# Patient Record
Sex: Female | Born: 1940 | Race: Black or African American | Hispanic: No | Marital: Single | Smoking: Former smoker
Health system: Southern US, Community
[De-identification: ages and names within clinical notes are randomized; demographics above are authoritative.]

## PROBLEM LIST (undated history)

## (undated) DIAGNOSIS — E11610 Type 2 diabetes mellitus with diabetic neuropathic arthropathy: Secondary | ICD-10-CM

## (undated) DIAGNOSIS — R198 Other specified symptoms and signs involving the digestive system and abdomen: Secondary | ICD-10-CM

## (undated) DIAGNOSIS — K219 Gastro-esophageal reflux disease without esophagitis: Secondary | ICD-10-CM

## (undated) DIAGNOSIS — J189 Pneumonia, unspecified organism: Secondary | ICD-10-CM

## (undated) DIAGNOSIS — M1990 Unspecified osteoarthritis, unspecified site: Secondary | ICD-10-CM

## (undated) DIAGNOSIS — E785 Hyperlipidemia, unspecified: Secondary | ICD-10-CM

## (undated) DIAGNOSIS — Z8719 Personal history of other diseases of the digestive system: Secondary | ICD-10-CM

## (undated) DIAGNOSIS — K649 Unspecified hemorrhoids: Secondary | ICD-10-CM

## (undated) DIAGNOSIS — S82891A Other fracture of right lower leg, initial encounter for closed fracture: Secondary | ICD-10-CM

## (undated) DIAGNOSIS — Z794 Long term (current) use of insulin: Secondary | ICD-10-CM

## (undated) DIAGNOSIS — K529 Noninfective gastroenteritis and colitis, unspecified: Secondary | ICD-10-CM

## (undated) DIAGNOSIS — E669 Obesity, unspecified: Secondary | ICD-10-CM

## (undated) DIAGNOSIS — S72401A Unspecified fracture of lower end of right femur, initial encounter for closed fracture: Secondary | ICD-10-CM

## (undated) DIAGNOSIS — I10 Essential (primary) hypertension: Secondary | ICD-10-CM

## (undated) DIAGNOSIS — Z8711 Personal history of peptic ulcer disease: Secondary | ICD-10-CM

## (undated) DIAGNOSIS — E7800 Pure hypercholesterolemia, unspecified: Secondary | ICD-10-CM

## (undated) DIAGNOSIS — E119 Type 2 diabetes mellitus without complications: Secondary | ICD-10-CM

## (undated) DIAGNOSIS — J45909 Unspecified asthma, uncomplicated: Secondary | ICD-10-CM

## (undated) DIAGNOSIS — R51 Headache: Secondary | ICD-10-CM

## (undated) DIAGNOSIS — F419 Anxiety disorder, unspecified: Secondary | ICD-10-CM

## (undated) HISTORY — DX: Other specified symptoms and signs involving the digestive system and abdomen: R19.8

## (undated) HISTORY — PX: CATARACT EXTRACTION W/ INTRAOCULAR LENS  IMPLANT, BILATERAL: SHX1307

## (undated) HISTORY — DX: Noninfective gastroenteritis and colitis, unspecified: K52.9

## (undated) HISTORY — PX: ABDOMINAL HYSTERECTOMY: SHX81

## (undated) HISTORY — PX: BREAST LUMPECTOMY: SHX2

---

## 1997-11-26 ENCOUNTER — Ambulatory Visit (HOSPITAL_COMMUNITY): Admission: RE | Admit: 1997-11-26 | Discharge: 1997-11-26 | Payer: Self-pay | Admitting: *Deleted

## 1997-12-06 ENCOUNTER — Ambulatory Visit (HOSPITAL_COMMUNITY): Admission: RE | Admit: 1997-12-06 | Discharge: 1997-12-06 | Payer: Self-pay | Admitting: *Deleted

## 1997-12-06 ENCOUNTER — Encounter: Payer: Self-pay | Admitting: *Deleted

## 1999-03-14 ENCOUNTER — Encounter: Payer: Self-pay | Admitting: Family Medicine

## 1999-03-14 ENCOUNTER — Ambulatory Visit (HOSPITAL_COMMUNITY): Admission: RE | Admit: 1999-03-14 | Discharge: 1999-03-14 | Payer: Self-pay | Admitting: Unknown Physician Specialty

## 2000-12-24 ENCOUNTER — Encounter: Payer: Self-pay | Admitting: Family Medicine

## 2000-12-24 ENCOUNTER — Ambulatory Visit (HOSPITAL_COMMUNITY): Admission: RE | Admit: 2000-12-24 | Discharge: 2000-12-24 | Payer: Self-pay | Admitting: Family Medicine

## 2002-05-24 ENCOUNTER — Emergency Department (HOSPITAL_COMMUNITY): Admission: EM | Admit: 2002-05-24 | Discharge: 2002-05-24 | Payer: Self-pay | Admitting: Emergency Medicine

## 2002-07-12 ENCOUNTER — Encounter: Payer: Self-pay | Admitting: Family Medicine

## 2002-07-12 ENCOUNTER — Encounter (HOSPITAL_BASED_OUTPATIENT_CLINIC_OR_DEPARTMENT_OTHER): Admission: RE | Admit: 2002-07-12 | Discharge: 2002-10-10 | Payer: Self-pay | Admitting: Internal Medicine

## 2002-07-12 ENCOUNTER — Ambulatory Visit (HOSPITAL_COMMUNITY): Admission: RE | Admit: 2002-07-12 | Discharge: 2002-07-12 | Payer: Self-pay | Admitting: Family Medicine

## 2002-08-27 ENCOUNTER — Emergency Department (HOSPITAL_COMMUNITY): Admission: EM | Admit: 2002-08-27 | Discharge: 2002-08-27 | Payer: Self-pay | Admitting: Emergency Medicine

## 2002-09-03 ENCOUNTER — Inpatient Hospital Stay (HOSPITAL_COMMUNITY): Admission: EM | Admit: 2002-09-03 | Discharge: 2002-09-09 | Payer: Self-pay | Admitting: Emergency Medicine

## 2002-09-03 ENCOUNTER — Emergency Department (HOSPITAL_COMMUNITY): Admission: EM | Admit: 2002-09-03 | Discharge: 2002-09-03 | Payer: Self-pay | Admitting: Emergency Medicine

## 2002-09-04 ENCOUNTER — Encounter: Payer: Self-pay | Admitting: Internal Medicine

## 2002-09-04 ENCOUNTER — Encounter: Payer: Self-pay | Admitting: Family Medicine

## 2002-09-04 DIAGNOSIS — K529 Noninfective gastroenteritis and colitis, unspecified: Secondary | ICD-10-CM

## 2002-09-04 HISTORY — DX: Noninfective gastroenteritis and colitis, unspecified: K52.9

## 2002-09-06 ENCOUNTER — Encounter (INDEPENDENT_AMBULATORY_CARE_PROVIDER_SITE_OTHER): Payer: Self-pay | Admitting: Specialist

## 2002-09-06 DIAGNOSIS — K529 Noninfective gastroenteritis and colitis, unspecified: Secondary | ICD-10-CM

## 2002-09-06 HISTORY — DX: Noninfective gastroenteritis and colitis, unspecified: K52.9

## 2002-09-07 ENCOUNTER — Encounter: Payer: Self-pay | Admitting: Internal Medicine

## 2002-10-19 ENCOUNTER — Encounter (HOSPITAL_BASED_OUTPATIENT_CLINIC_OR_DEPARTMENT_OTHER): Admission: RE | Admit: 2002-10-19 | Discharge: 2003-01-17 | Payer: Self-pay | Admitting: Internal Medicine

## 2002-11-03 ENCOUNTER — Ambulatory Visit (HOSPITAL_COMMUNITY): Admission: RE | Admit: 2002-11-03 | Discharge: 2002-11-03 | Payer: Self-pay | Admitting: Gastroenterology

## 2003-01-25 ENCOUNTER — Encounter (HOSPITAL_BASED_OUTPATIENT_CLINIC_OR_DEPARTMENT_OTHER): Admission: RE | Admit: 2003-01-25 | Discharge: 2003-04-02 | Payer: Self-pay | Admitting: Internal Medicine

## 2003-05-23 ENCOUNTER — Encounter (HOSPITAL_BASED_OUTPATIENT_CLINIC_OR_DEPARTMENT_OTHER): Admission: RE | Admit: 2003-05-23 | Discharge: 2003-08-21 | Payer: Self-pay | Admitting: Internal Medicine

## 2003-08-22 ENCOUNTER — Encounter (HOSPITAL_BASED_OUTPATIENT_CLINIC_OR_DEPARTMENT_OTHER): Admission: RE | Admit: 2003-08-22 | Discharge: 2003-08-31 | Payer: Self-pay | Admitting: Internal Medicine

## 2003-11-28 ENCOUNTER — Encounter (HOSPITAL_BASED_OUTPATIENT_CLINIC_OR_DEPARTMENT_OTHER): Admission: RE | Admit: 2003-11-28 | Discharge: 2004-02-26 | Payer: Self-pay | Admitting: Internal Medicine

## 2004-02-25 ENCOUNTER — Encounter: Admission: RE | Admit: 2004-02-25 | Discharge: 2004-02-25 | Payer: Self-pay | Admitting: Internal Medicine

## 2004-03-05 ENCOUNTER — Encounter (HOSPITAL_BASED_OUTPATIENT_CLINIC_OR_DEPARTMENT_OTHER): Admission: RE | Admit: 2004-03-05 | Discharge: 2004-04-25 | Payer: Self-pay | Admitting: Internal Medicine

## 2004-05-01 ENCOUNTER — Encounter (HOSPITAL_BASED_OUTPATIENT_CLINIC_OR_DEPARTMENT_OTHER): Admission: RE | Admit: 2004-05-01 | Discharge: 2004-07-30 | Payer: Self-pay | Admitting: Surgery

## 2004-08-01 ENCOUNTER — Encounter (HOSPITAL_BASED_OUTPATIENT_CLINIC_OR_DEPARTMENT_OTHER): Admission: RE | Admit: 2004-08-01 | Discharge: 2004-10-30 | Payer: Self-pay | Admitting: Surgery

## 2004-10-30 ENCOUNTER — Encounter (HOSPITAL_BASED_OUTPATIENT_CLINIC_OR_DEPARTMENT_OTHER): Admission: RE | Admit: 2004-10-30 | Discharge: 2005-01-28 | Payer: Self-pay | Admitting: Surgery

## 2004-11-30 ENCOUNTER — Ambulatory Visit (HOSPITAL_COMMUNITY): Admission: RE | Admit: 2004-11-30 | Discharge: 2004-11-30 | Payer: Self-pay | Admitting: Surgery

## 2005-06-29 ENCOUNTER — Encounter: Admission: RE | Admit: 2005-06-29 | Discharge: 2005-06-29 | Payer: Self-pay | Admitting: Internal Medicine

## 2005-09-23 ENCOUNTER — Encounter (HOSPITAL_BASED_OUTPATIENT_CLINIC_OR_DEPARTMENT_OTHER): Admission: RE | Admit: 2005-09-23 | Discharge: 2005-10-22 | Payer: Self-pay | Admitting: Surgery

## 2005-11-11 ENCOUNTER — Encounter (HOSPITAL_BASED_OUTPATIENT_CLINIC_OR_DEPARTMENT_OTHER): Admission: RE | Admit: 2005-11-11 | Discharge: 2006-01-14 | Payer: Self-pay | Admitting: Internal Medicine

## 2006-01-01 ENCOUNTER — Ambulatory Visit (HOSPITAL_COMMUNITY): Admission: RE | Admit: 2006-01-01 | Discharge: 2006-01-01 | Payer: Self-pay | Admitting: Orthopedic Surgery

## 2006-01-01 ENCOUNTER — Encounter: Payer: Self-pay | Admitting: Vascular Surgery

## 2006-01-13 ENCOUNTER — Encounter: Payer: Self-pay | Admitting: Vascular Surgery

## 2006-01-13 ENCOUNTER — Ambulatory Visit (HOSPITAL_COMMUNITY): Admission: RE | Admit: 2006-01-13 | Discharge: 2006-01-13 | Payer: Self-pay | Admitting: Orthopedic Surgery

## 2006-02-18 ENCOUNTER — Ambulatory Visit (HOSPITAL_COMMUNITY): Admission: RE | Admit: 2006-02-18 | Discharge: 2006-02-18 | Payer: Self-pay | Admitting: Orthopedic Surgery

## 2006-07-13 ENCOUNTER — Encounter: Admission: RE | Admit: 2006-07-13 | Discharge: 2006-07-13 | Payer: Self-pay | Admitting: Internal Medicine

## 2007-09-08 ENCOUNTER — Emergency Department (HOSPITAL_COMMUNITY): Admission: EM | Admit: 2007-09-08 | Discharge: 2007-09-08 | Payer: Self-pay | Admitting: Emergency Medicine

## 2008-02-27 HISTORY — PX: BELOW KNEE LEG AMPUTATION: SUR23

## 2008-03-13 ENCOUNTER — Encounter (INDEPENDENT_AMBULATORY_CARE_PROVIDER_SITE_OTHER): Payer: Self-pay | Admitting: Orthopaedic Surgery

## 2008-03-13 ENCOUNTER — Inpatient Hospital Stay (HOSPITAL_COMMUNITY): Admission: RE | Admit: 2008-03-13 | Discharge: 2008-03-19 | Payer: Self-pay | Admitting: Orthopaedic Surgery

## 2008-03-16 ENCOUNTER — Ambulatory Visit: Payer: Self-pay | Admitting: Physical Medicine & Rehabilitation

## 2008-03-19 ENCOUNTER — Inpatient Hospital Stay (HOSPITAL_COMMUNITY)
Admission: RE | Admit: 2008-03-19 | Discharge: 2008-03-30 | Payer: Self-pay | Admitting: Physical Medicine & Rehabilitation

## 2008-05-04 ENCOUNTER — Encounter
Admission: RE | Admit: 2008-05-04 | Discharge: 2008-05-04 | Payer: Self-pay | Admitting: Physical Medicine & Rehabilitation

## 2008-08-30 ENCOUNTER — Encounter: Admission: RE | Admit: 2008-08-30 | Discharge: 2008-08-30 | Payer: Self-pay | Admitting: Internal Medicine

## 2008-10-17 ENCOUNTER — Encounter: Admission: RE | Admit: 2008-10-17 | Discharge: 2009-01-16 | Payer: Self-pay | Admitting: Orthopaedic Surgery

## 2009-01-29 ENCOUNTER — Encounter: Admission: RE | Admit: 2009-01-29 | Discharge: 2009-04-29 | Payer: Self-pay | Admitting: Orthopaedic Surgery

## 2009-05-07 ENCOUNTER — Encounter: Admission: RE | Admit: 2009-05-07 | Discharge: 2009-05-16 | Payer: Self-pay | Admitting: Orthopaedic Surgery

## 2009-10-18 ENCOUNTER — Encounter: Admission: RE | Admit: 2009-10-18 | Discharge: 2009-10-18 | Payer: Self-pay | Admitting: Internal Medicine

## 2010-02-16 ENCOUNTER — Encounter: Payer: Self-pay | Admitting: Internal Medicine

## 2010-02-17 ENCOUNTER — Encounter: Payer: Self-pay | Admitting: Internal Medicine

## 2010-05-08 LAB — GLUCOSE, CAPILLARY
Glucose-Capillary: 103 mg/dL — ABNORMAL HIGH (ref 70–99)
Glucose-Capillary: 110 mg/dL — ABNORMAL HIGH (ref 70–99)
Glucose-Capillary: 139 mg/dL — ABNORMAL HIGH (ref 70–99)
Glucose-Capillary: 148 mg/dL — ABNORMAL HIGH (ref 70–99)
Glucose-Capillary: 166 mg/dL — ABNORMAL HIGH (ref 70–99)
Glucose-Capillary: 173 mg/dL — ABNORMAL HIGH (ref 70–99)
Glucose-Capillary: 187 mg/dL — ABNORMAL HIGH (ref 70–99)
Glucose-Capillary: 190 mg/dL — ABNORMAL HIGH (ref 70–99)

## 2010-05-08 LAB — BASIC METABOLIC PANEL
BUN: 2 mg/dL — ABNORMAL LOW (ref 6–23)
BUN: 3 mg/dL — ABNORMAL LOW (ref 6–23)
Chloride: 101 mEq/L (ref 96–112)
Chloride: 99 mEq/L (ref 96–112)
Creatinine, Ser: 0.64 mg/dL (ref 0.4–1.2)
GFR calc non Af Amer: >60 mL/min (ref >60)
Potassium: 3.6 mEq/L (ref 3.5–5.1)

## 2010-05-13 LAB — CROSSMATCH: ABO/RH(D): B NEG

## 2010-05-13 LAB — GLUCOSE, CAPILLARY
Comment 2: 120008390
Glucose-Capillary: 103 mg/dL — ABNORMAL HIGH (ref 70–99)
Glucose-Capillary: 109 mg/dL — ABNORMAL HIGH (ref 70–99)
Glucose-Capillary: 110 mg/dL — ABNORMAL HIGH (ref 70–99)
Glucose-Capillary: 117 mg/dL — ABNORMAL HIGH (ref 70–99)
Glucose-Capillary: 123 mg/dL — ABNORMAL HIGH (ref 70–99)
Glucose-Capillary: 145 mg/dL — ABNORMAL HIGH (ref 70–99)
Glucose-Capillary: 148 mg/dL — ABNORMAL HIGH (ref 70–99)
Glucose-Capillary: 156 mg/dL — ABNORMAL HIGH (ref 70–99)
Glucose-Capillary: 158 mg/dL — ABNORMAL HIGH (ref 70–99)
Glucose-Capillary: 159 mg/dL — ABNORMAL HIGH (ref 70–99)
Glucose-Capillary: 164 mg/dL — ABNORMAL HIGH (ref 70–99)
Glucose-Capillary: 166 mg/dL — ABNORMAL HIGH (ref 70–99)
Glucose-Capillary: 169 mg/dL — ABNORMAL HIGH (ref 70–99)
Glucose-Capillary: 170 mg/dL — ABNORMAL HIGH (ref 70–99)
Glucose-Capillary: 170 mg/dL — ABNORMAL HIGH (ref 70–99)
Glucose-Capillary: 172 mg/dL — ABNORMAL HIGH (ref 70–99)
Glucose-Capillary: 172 mg/dL — ABNORMAL HIGH (ref 70–99)
Glucose-Capillary: 177 mg/dL — ABNORMAL HIGH (ref 70–99)
Glucose-Capillary: 178 mg/dL — ABNORMAL HIGH (ref 70–99)
Glucose-Capillary: 179 mg/dL — ABNORMAL HIGH (ref 70–99)
Glucose-Capillary: 182 mg/dL — ABNORMAL HIGH (ref 70–99)
Glucose-Capillary: 183 mg/dL — ABNORMAL HIGH (ref 70–99)
Glucose-Capillary: 189 mg/dL — ABNORMAL HIGH (ref 70–99)
Glucose-Capillary: 193 mg/dL — ABNORMAL HIGH (ref 70–99)
Glucose-Capillary: 204 mg/dL — ABNORMAL HIGH (ref 70–99)
Glucose-Capillary: 206 mg/dL — ABNORMAL HIGH (ref 70–99)
Glucose-Capillary: 226 mg/dL — ABNORMAL HIGH (ref 70–99)
Glucose-Capillary: 23 mg/dL — CL (ref 70–99)
Glucose-Capillary: 249 mg/dL — ABNORMAL HIGH (ref 70–99)
Glucose-Capillary: 26 mg/dL — CL (ref 70–99)
Glucose-Capillary: 260 mg/dL — ABNORMAL HIGH (ref 70–99)
Glucose-Capillary: 38 mg/dL — CL (ref 70–99)
Glucose-Capillary: 49 mg/dL — ABNORMAL LOW (ref 70–99)
Glucose-Capillary: 70 mg/dL (ref 70–99)
Glucose-Capillary: 83 mg/dL (ref 70–99)
Glucose-Capillary: 88 mg/dL (ref 70–99)

## 2010-05-13 LAB — BASIC METABOLIC PANEL
BUN: 12 mg/dL (ref 6–23)
BUN: 6 mg/dL (ref 6–23)
CO2: 27 mEq/L (ref 19–32)
CO2: 27 mEq/L (ref 19–32)
Calcium: 8.1 mg/dL — ABNORMAL LOW (ref 8.4–10.5)
Calcium: 8.4 mg/dL (ref 8.4–10.5)
Chloride: 100 mEq/L (ref 96–112)
Chloride: 103 mEq/L (ref 96–112)
Creatinine, Ser: 0.72 mg/dL (ref 0.4–1.2)
Creatinine, Ser: 0.72 mg/dL (ref 0.4–1.2)
Creatinine, Ser: 0.99 mg/dL (ref 0.4–1.2)
GFR calc Af Amer: >60 mL/min (ref >60)
GFR calc Af Amer: >60 mL/min (ref >60)
GFR calc non Af Amer: 56 mL/min — ABNORMAL LOW (ref >60)
GFR calc non Af Amer: >60 mL/min (ref >60)
GFR calc non Af Amer: >60 mL/min (ref >60)
Glucose, Bld: 183 mg/dL — ABNORMAL HIGH (ref 70–99)
Glucose, Bld: 252 mg/dL — ABNORMAL HIGH (ref 70–99)
Glucose, Bld: 48 mg/dL — ABNORMAL LOW (ref 70–99)
Potassium: 3.9 mEq/L (ref 3.5–5.1)
Potassium: 4.1 mEq/L (ref 3.5–5.1)
Sodium: 136 mEq/L (ref 135–145)
Sodium: 137 mEq/L (ref 135–145)

## 2010-05-13 LAB — CBC
HCT: 26.1 % — ABNORMAL LOW (ref 36.0–46.0)
HCT: 27.5 % — ABNORMAL LOW (ref 36.0–46.0)
MCHC: 33.1 g/dL (ref 30.0–36.0)
MCHC: 33.7 g/dL (ref 30.0–36.0)
MCHC: 33.8 g/dL (ref 30.0–36.0)
MCHC: 34.3 g/dL (ref 30.0–36.0)
MCV: 74.6 fL — ABNORMAL LOW (ref 78.0–100.0)
MCV: 81.1 fL (ref 78.0–100.0)
MCV: 81.8 fL (ref 78.0–100.0)
Platelets: 278 10*3/uL (ref 150–400)
Platelets: 298 10*3/uL (ref 150–400)
Platelets: 313 10*3/uL (ref 150–400)
Platelets: 337 10*3/uL (ref 150–400)
Platelets: 372 10*3/uL (ref 150–400)
RBC: 3.36 MIL/uL — ABNORMAL LOW (ref 3.87–5.11)
RBC: 3.5 MIL/uL — ABNORMAL LOW (ref 3.87–5.11)
RDW: 19.7 % — ABNORMAL HIGH (ref 11.5–15.5)
RDW: 20 % — ABNORMAL HIGH (ref 11.5–15.5)
RDW: 20.2 % — ABNORMAL HIGH (ref 11.5–15.5)
RDW: 20.6 % — ABNORMAL HIGH (ref 11.5–15.5)
WBC: 11 10*3/uL — ABNORMAL HIGH (ref 4.0–10.5)
WBC: 8 10*3/uL (ref 4.0–10.5)

## 2010-05-13 LAB — URINE MICROSCOPIC-ADD ON

## 2010-05-13 LAB — MAGNESIUM: Magnesium: 1.4 mg/dL — ABNORMAL LOW (ref 1.5–2.5)

## 2010-05-13 LAB — DIFFERENTIAL
Basophils Relative: 1 % (ref 0–1)
Eosinophils Absolute: 0.2 10*3/uL (ref 0.0–0.7)
Monocytes Absolute: 0.4 10*3/uL (ref 0.1–1.0)
Monocytes Relative: 6 % (ref 3–12)

## 2010-05-13 LAB — URINALYSIS, ROUTINE W REFLEX MICROSCOPIC
Glucose, UA: NEGATIVE mg/dL
Nitrite: NEGATIVE
Specific Gravity, Urine: 1.005 (ref 1.005–1.030)
pH: 7 (ref 5.0–8.0)

## 2010-05-13 LAB — PREPARE RBC (CROSSMATCH)

## 2010-05-13 LAB — COMPREHENSIVE METABOLIC PANEL
ALT: <8 U/L (ref 0–35)
Albumin: 1.9 g/dL — ABNORMAL LOW (ref 3.5–5.2)
Alkaline Phosphatase: 54 U/L (ref 39–117)
GFR calc Af Amer: >60 mL/min (ref >60)
Potassium: 3.6 mEq/L (ref 3.5–5.1)
Sodium: 136 mEq/L (ref 135–145)
Total Protein: 5.7 g/dL — ABNORMAL LOW (ref 6.0–8.3)

## 2010-05-13 LAB — URINE CULTURE

## 2010-05-13 LAB — HEMOGLOBIN A1C
Hgb A1c MFr Bld: 6.5 % — ABNORMAL HIGH (ref 4.6–6.1)
Mean Plasma Glucose: 140 mg/dL
Mean Plasma Glucose: 154 mg/dL

## 2010-06-10 NOTE — Consult Note (Signed)
[name], [name]                  ACCOUNT NO.:  [id number]   MEDICAL RECORD NO.:  [id number]          PATIENT TYPE:  INP   LOCATION:  5039                         FACILITY:  [facility]   PHYSICIAN:  Lonia Blood, M.D.DATE OF BIRTH:  [birth year]   DATE OF CONSULTATION:  03/14/2008  DATE OF DISCHARGE:                                 CONSULTATION   REQUESTING PHYSICIAN:  Dr. Jerl Santos.   PRIMARY CARE PHYSICIAN:  Dr. Fleet Contras, M.D.   REASON FOR CONSULTATION:  Uncontrolled diabetes mellitus.   HISTORY OF PRESENT ILLNESS:  Ms. Judith [name] is a very pleasant 70-year-  old female with a longstanding history of diabetes and Charcot joints.  She suffered a right ankle fracture many months ago.  Unfortunately, she  therefore developed nonhealing ulcers of the same extremity.  She was  admitted to the hospital on March 13, 2008, by orthopedics to undergo  an elective right below-knee-amputation for the severe nonhealing wounds  and an essentially necrotic foot.  In the postoperative period, the  patient has had difficulty with severe hyperglycemia.  We have been  consulted to evaluate this issue and help to attend to the patient's  diabetes control during her hospital stay.   The patient is seen in her bed on the 5000 unit at [facility].  She presently has no complaints whatsoever.  She states that her pain is  well controlled.  She specifically denies chest pain, fevers, chills,  nausea, vomiting, abdominal pain and shortness of breath.   REVIEW OF SYSTEMS:  A comprehensive review of systems is entirely  unremarkable.   PAST MEDICAL HISTORY:  1. Uncontrolled diabetes mellitus.  2. Charcot joints bilateral ankles secondary to above.  3. Hypercholesterolemia.  4. Hypertension.  5. Status post right below-knee-amputation secondary to nonhealing      right lower extremity ulcers on March 13, 2008.   OUTPATIENT MEDICATIONS:  1. Actoplus Met 15/500 two tablets in the  a.m. and one tablet in the      p.m.  2. Omeprazole 40 mg daily.  3. Simvastatin 40 mg daily.  4. Diovan 160 daily.  5. Glipizide 10 mg q.a.m.  6. Aspirin 81 mg daily.  7. Calcium daily.  8. Levemir 35 units q.p.m.   ALLERGIES:  NO KNOWN DRUG ALLERGIES.   FAMILY HISTORY:  Noncontributory to this consultation.   SOCIAL HISTORY:  The patient lives in [place] with her son.  She does  not smoke.  She does not drink, nor has she ever.  She is retired.   LABORATORY DATA:  Sodium, potassium, chloride, bicarb, BUN and  creatinine are all normal.  Serum glucose is elevated at 252.  Calcium  is normal.  Hemoglobin was 8.6 at presentation, then dropped to 5.6  immediately postoperatively and with transfusion of 1 unit increased to  6.3.  White count is 11.9 with a platelet count of 278.  Chest x-ray  reveals no acute disease.  A 12-lead EKG is reviewed and reveals normal  sinus rhythm at 94 beats per minute with no acute ST or T-wave changes.  PHYSICAL EXAMINATION:  VITAL SIGNS:  Temperature 98.5, blood pressure  111/68, heart rate 90, respiratory rate 24.  O2 sats 100% on 2 liters  per minute nasal cannula.  CBG has ranged from 249-260 over the last 24  hours.  HEENT:  Normocephalic, atraumatic.  Pupils equal, round and reactive to  light and accommodation.  Extraocular muscles intact bilaterally.  OC/OP  clear.  NECK:  No JVD.  LUNGS:  Clear to auscultation without wheezes or rhonchi.  CARDIOVASCULAR:  Regular rate and rhythm without murmur, gallop or rub.  Normal S1-S2.  ABDOMEN:  Nontender, nondistended.  Soft.  Bowel sounds present.  No  organomegaly, no rebound, no ascites.  EXTREMITIES:  No significant clubbing, cyanosis or edema of the left  lower extremity.  NEUROLOGIC:  Nonfocal neurologic exam.   RECOMMENDATIONS:  1. Diabetes mellitus - the patient's diabetes is presently very poorly      controlled.  Strict control of diabetes is clearly indicated at all      times,  but even more so in the immediate perioperative period.  To      maximize wound healing, we must maintain consistent strict control      of the patient's blood sugars.  I will discontinue her oral      medications as they proved to be somewhat unpredictable in the      inpatient setting.  We will treat her strictly with sliding scale      insulin.  We will administered scheduled insulin with her meals, as      well as a dose of Lantus nightly.  We will follow her CBGs closely,      and it is anticipated that further adjustments will likely be      required.  Aspirin therapy has been resumed.  For now, we will hold      ACE inhibitor/ARB for blood pressures below 120 due to the concern      of aggravation of renal function in the setting of hypovolemia.  2. Acute blood loss anemia on a baseline of chronic anemia - it      appears on review of old records that the patient's baseline      hemoglobin is likely somewhere between 8-10.  She presented with a      hemoglobin of 8.6.  Postoperatively, she was found to have a      hemoglobin of 5.6.  This is likely due to a combination of blood      loss from the surgery, as well as volume expansion during surgery      leading to dilution.  I agree with transfusing as you are.  My goal      would be a minimal hemoglobin of 8 with ideal target of 10.  We      will follow her hemoglobin closely and continue to transfuse as      indicated.  3. Hypercholesterolemia - the patient is currently on treatment and      this will be continued.  4. Hypertension - at the present time the patient's blood pressure is      well controlled.  This is due to significant volume loss associated      with acute blood loss anemia.  We will hold her ACE inhibitor/ARB      until the patient's blood pressure is consistently greater than      120.   Thank you very much for your consultation on this pleasant lady.  We  will be happy to follow along with you during her  hospital stay.     Lonia Blood, M.D.  Electronically Signed    JTM/MEDQ  D:  03/14/2008  T:  03/14/2008  Job:  [id number]

## 2010-06-10 NOTE — Op Note (Signed)
[name], Judith [name]                  ACCOUNT NO.:  [id number]   MEDICAL RECORD NO.:  [id number]          PATIENT TYPE:  INP   LOCATION:  5039                         FACILITY:  [facility]   PHYSICIAN:  Lubertha Basque. Dalldorf, M.D.DATE OF BIRTH:  20-Jul-1940   DATE OF PROCEDURE:  03/13/2008  DATE OF DISCHARGE:                               OPERATIVE REPORT   PREOPERATIVE DIAGNOSIS:  Right foot necrosis.   POSTOPERATIVE DIAGNOSIS:  Right foot necrosis.   PROCEDURE:  Right below-knee amputation.   ANESTHESIA:  General.   ATTENDING SURGEON:  Lubertha Basque. Jerl Santos, MD   ASSISTANT:  Lindwood Qua, PA   INDICATION FOR PROCEDURE:  The patient is a 70 year old woman who  unfortunately suffered an ankle fracture several months back.  She is  beset with diabetes and Charcot feet.  She is for most part bed to chair  in terms of activity level.  Unfortunately, she has developed nonhealing  ulcers on her right foot and ankle.  She is offered below-knee  amputation at this point.  Informed operative consent was obtained from  Zilah and her family after discussion of possible complications including  reaction to anesthesia, poor healing, and need for further more proximal  amputation.   SUMMARY OF FINDINGS AND PROCEDURE:  Under general anesthesia, a right  below-knee amputation was performed with a posterior flap which was  brought forward under minimal tension.  Bryna Colander assisted  throughout and was invaluable to the completion of the case in that he  helped retract while I could perform the procedure.  He also closed  simultaneously to help minimize OR time.   DESCRIPTION OF PROCEDURE:  The patient was taken to the operating suite  where general anesthetic was applied without difficulty.  She was  positioned supine and prepped and draped in normal sterile fashion.  After administration of IV Kefzol, the right leg was elevated,  exsanguinated, and the tourniquet inflated about the thigh.  A  circumferential incision was made about 15 cm distal to the joint line  with a large posterior flap created which extended about 25 cm below the  joint line.  We dissected down through the soft tissues to expose bone.  Bone was resected at about 10 or 12 centimeters below the joint line  with oscillating saw.  We used a guillotine knife to remove the leg.  I  tied three vascular structures and ligated with suture.  We irrigated  thoroughly.  We then placed a Penrose drain and brought the posterior  flap forward sewing fascia to fascia with #1 Vicryl in interrupted  fashion.  Subcutaneous tissues were reapproximated with 0 and 2-0 undyed  Vicryl and skin was closed with staples.  Adaptic was applied followed  by dry gauze and a loose Ace wrap.  We did deflate the tourniquet during  closure to make sure skin edges all bled well to make sure that we had  good hemostasis.  This was done prior to any closure.  Estimated blood  loss was 200 mL and intraoperative fluids can be obtained from  anesthesia records.  DISPOSITION:  The patient was extubated in the operating room and taken  to recovery room in stable addition.  She was scheduled to be admitted  through Orthopedic Surgery Service with medical consultation.      Lubertha Basque Jerl Santos, M.D.  Electronically Signed     PGD/MEDQ  D:  03/13/2008  T:  03/14/2008  Job:  [id number]

## 2010-06-10 NOTE — Discharge Summary (Signed)
NAMENOELLY, LASSEIGNE                  ACCOUNT NO.:  [id number]   MEDICAL RECORD NO.:  [id number]          PATIENT TYPE:  IPS   LOCATION:  4025                         FACILITY:  [facility]   PHYSICIAN:  Ranelle Oyster, M.D.DATE OF BIRTH:  May 09, 1940   DATE OF ADMISSION:  03/19/2008  DATE OF DISCHARGE:  03/30/2008                               DISCHARGE SUMMARY   DISCHARGE DIAGNOSES:  1. Right below-knee amputation, March 13, 2008 secondary to      peripheral vascular disease.  2. Severe Charcot joints, pain controlled.  3. Diabetes mellitus.  4. Hypertension.  5. Hyperlipidemia.  6. Subcutaneous Lovenox for deep vein thrombosis prophylaxis.  7. Escherichia coli urinary tract infection.   HISTORY OF PRESENT ILLNESS:  This is a 70 year old female with history  of poorly controlled diabetes mellitus, severe Charcot joints, and right  ankle fracture many months ago.  The patient with nonhealing ulcer,  right lower extremity and necrotic changes.  Underwent right below-knee  amputation, March 13, 2008 per Dr. Jerl Santos.  Postoperative anemia  6.3 and transfused 2 units on March 14, 2008 with hemoglobin improved  to 9.3.  Pain control with PCA, discontinued March 17, 2008.  Subcutaneous Lovenox added for deep vein thrombosis prophylaxis.  Bouts  of hypoglycemia with Lantus insulin held.  Actos and Glucophage started  March 19, 2008 per Medicine Team.  The patient was admitted for  comprehensive rehab program.   PAST MEDICAL HISTORY:  See discharge diagnoses.  No alcohol or tobacco.   ALLERGIES:  None.   SOCIAL HISTORY:  Lives with son.  The patient attends Adult Day Care 4  hours a day and has a Home Health aide 3 hours a day.  Son can assist on  discharge.  One-level home, no steps to entry.   FUNCTIONAL HISTORY:  Prior to admission was wheelchair bound.  She had  not ambulated in 6 months.   FUNCTIONAL STATUS:  Upon admission to [facility], she was moderate  assist bed mobility plus total assist to transfer.  The patient fatigues  quickly with decreased endurance.  Gait was not tested.  She was max  assist for activities of daily living.   MEDICATIONS PRIOR TO ADMISSION:  1. Actos/Glucophage 15/500 mg 2 tablets in the a.m. and 1 tablet in      the p.m.  2. Prilosec 20 mg 2 tablets daily.  3. Zocor 40 mg daily.  4. Diovan 160 mg daily.  5. Glipizide 5 mg 2 tablets in the a.m.  6. Aspirin 81 mg daily.  7. Calcium daily.  8. Levemir 35 units in the p.m.   PHYSICAL EXAMINATION:  VITAL SIGNS:  Blood pressure 160/76, pulse 87,  temperature 98.2, and respirations 20.  GENERAL:  This was an alert female in no acute distress and oriented x3.  NEUROLOGIC:  Deep tendon reflexes were hypoactive.  Sensation decreased  to light touch distally.  EXTREMITIES:  Left calf was cool without any swelling, erythema, and  nontender.  Clips with Ace wrap to below-knee amputation site.  LUNGS:  Clear to auscultation.  CARDIAC:  Regular rate and rhythm.  ABDOMEN:  Soft and nontender.  Good bowel sounds.   REHABILITATION HOSPITAL COURSE:  The patient was admitted to Inpatient  Rehab Services with therapies initiated on a 3-hour daily basis  consisting of physical therapy, occupational therapy, and rehabilitation  nursing.  The following issues were addressed during the patient's  rehabilitation stay.  Pertaining to Mrs. Lavoy' right below-knee  amputation, March 13, 2008, surgical site is healing nicely.  Plan  would be to follow up with Dr. Jerl Santos.  Stump shrinker was applied per  [facility].  We will continue to follow her in our Outpatient  Amputee Clinic.  Pain management ongoing with the use of Percocet and  good results.  Her blood sugars were monitored daily with latest blood  sugars of 148, 187, and 87.  She remained on Glucophage of 500 mg twice  daily and Actos 30 mg daily.  She received full diabetic teaching.  She  remained on  subcutaneous Lovenox for deep vein thrombosis prophylaxis  throughout her rehab course.  Her blood pressures were well-controlled.  She remained on Benicar 40 mg daily with no orthostatic changes.  During  her rehabilitation course, noted E. coli urinary tract infection,  initially placed on amoxicillin until sensitivities returned and  switched to Cipro x7 days on March 28, 2008.  She did have some mild  hypokalemia during her rehab course with supplement added.  Latest  chemistries with potassium of 3.6.  This showed improvement from 3.1.  She would follow up with her primary MD.  She remained on Zocor for  history of hyperlipidemia.  Weekly collaborative interdisciplinary team  conferences were held to discuss the patient's estimated length of stay,  ongoing family teaching, and any barriers to discharge.  She was overall  moderate assist for bathing and bed as well as for lower body dressing.  Sliding board transfers with moderate assist, this was sometimes at max  assist and variable.  She exhibited no unsafe behavior.  Routine  toileting was provided.  She had occasional incontinence of bladder and  she was currently being treated for a urinary tract infection as well as  a trial of Ditropan was added.  She did have a small stage II area to  the right buttocks with protective barrier cream in place.  Home Health  therapies would be ongoing.   LATEST LABORATORY DATA:  Sodium 138, potassium 3.6, BUN 2, and  creatinine 0.6.  Hemoglobin 9.2, hematocrit 26.9, and platelet 372,000.   DISCHARGE MEDICATIONS AT TIME OF DICTATION:  1. Cipro 250 mg twice daily x5 more days.  2. Actos 30 mg daily.  3. Glucophage 500 mg twice daily.  4. Zocor 40 mg daily.  5. Protonix 80 mg daily.  6. Aspirin 81 mg daily.  7. Calcium 500 mg daily.  8. Benicar 40 mg daily.  9. Ditropan 5 mg twice daily.  10.Percocet 5/325 mg 1-2 tablets every 4 hours as needed for pain,      dispense of 90 tablets.    DIET:  Diabetic diet.   SPECIAL INSTRUCTIONS:  Stump shrinker per [facility].  Follow  up with Dr. Jerl Santos outpatient for ongoing care after below-knee  amputation.  Arrangements made to follow up in the Outpatient Amputee  Clinic with Dr. Faith Rogue.  She should continue to see Dr. Arville Go for medical management.      Mariam Dollar, P.A.      Ranelle Oyster, M.D.  Electronically  Signed    DA/MEDQ  D:  03/29/2008  T:  03/29/2008  Job:  [id number]   cc:   Lubertha Basque. Jerl Santos, M.D.  Arville Go, M.D.

## 2010-06-10 NOTE — H&P (Signed)
NAMEVERNESSA, LIKES                  ACCOUNT NO.:  [id number]   MEDICAL RECORD NO.:  [id number]          PATIENT TYPE:  IPS   LOCATION:  4025                         FACILITY:  [facility]   PHYSICIAN:  Ranelle Oyster, M.D.DATE OF BIRTH:  [birth year]   DATE OF ADMISSION:  03/19/2008  DATE OF DISCHARGE:                              HISTORY & PHYSICAL   CHIEF COMPLAINT:  Right leg pain.   PRIMARY PHYSICIAN:  Dr. Sofie Rower   ORTHOPEDIC SURGEON:  Lubertha Basque. Dalldorf, MD   HISTORY OF PRESENT ILLNESS:  This is a 70 year old African American  female with diabetes and severe Charcot joint deformities, who suffered  a right ankle fracture many months ago.  She developed a nonhealing  ulcer, which did not progress and develop chronic changes.  Ultimately,  she underwent a right below-the-knee amputation on February 16, by Dr.  Jerl Santos.  Postoperatively, she developed anemia and was transfused at  hemoglobin 6.3.  Most recent hemoglobin is 9.3.  Pain control has been  fair to good.  She is been on PCA for 2 days.  Subcu Lovenox has been on  board for DVT prophylaxis.  She has had bouts of hypoglycemia and her  Lantus insulin was held.  She was started on Actos and metformin on  February 22, per her medical team.  The patient has failed to progress  to a point where she could return home and then after rehab evaluation,  it was determined that she could benefit from an inpatient rehab stay.   REVIEW OF SYSTEMS:  Notable for incontinence, weakness, numbness.  She  denies constipation, shortness breath, chest pain.  Pain under control,  as noted above.  She is sleeping well.  Full reviews in the written H&P.   PAST MEDICAL HISTORY:  Notable for diabetes, severe Charcot joint with  recent right ankle fracture.  She has had left foot surgery in the past  as well.  She has hyperlipidemia and hypertension.  She denies alcohol  or tobacco.   FAMILY HISTORY:  Positive for CAD and diabetes.   SOCIAL HISTORY:  The patient lives with her son and attends adult day  care 4 hours a day, has home health aide 3 hours a day.  Son can assist  at home.  She has a one-level house with no steps to enter.  She has not  walked for several months due to her wound problems.  She uses a  wheelchair primarily at home.   ALLERGIES:  None.   HOME MEDICATIONS:  Actos/metformin, omeprazole, simvastatin, Diovan,  glipizide, aspirin, calcium, and Levemir.   LABORATORY DATA:  Hemoglobin 9.3, white count 7000, platelets 298.  Sodium 137, potassium 4.1, BUN 6, creatinine 0.6.   PHYSICAL EXAMINATION:  VITAL SIGNS:  Blood pressure is 160/76, pulse is  87, respiratory rate 20, temperature 98.2.  GENERAL:  The patient is pleasant, alert and oriented x3.  Affect is  bright and appropriate.  NECK:  Supple without JVD or lymphadenopathy.  EARS, NOSE, AND THROAT:  Unremarkable with fair dentition.  Pink moist  mucosa.  CHEST:  Clear to auscultation bilaterally without wheezes, rales, or  rhonchi.  HEART:  Regular rate and rhythm without murmur, rubs, or gallops.  ABDOMEN:  Soft, nontender.  Bowel sounds are positive.  SKIN:  Right leg is notable for intact incision with mild  serosanguineous discharge.  There is a mild area of redness over the  anterior aspect which bears watching.  Area was appropriately tender.  The wound fluid healthy overall.  NEUROLOGICAL:  Cranial nerves II  through XII are intact.  Reflexes are 1+.  Sensation was decreased in  distal limbs.  Strength was 5/5 in the upper extremities.  Right lower  extremity is 1-2/5 proximal.  Left lower extremity is 2-3/5 proximal and  4/5 distally.  Judgment, orientation, memory, and mood are all within  age-related limits.   POST ADMISSION PHYSICIAN EVALUATION:  1. Functional deficits, secondary to right below-knee amputation,      postoperative day #6.  The patient with severe Charcot deformity in      the left foot as well.  2. The  patient is admitted to receive collaborative interdisciplinary      care between the physiatrist, rehab nursing staff, and therapy      team.  3. The patient level of medical complexity and substantial therapy      needs in context upon functional assessment at the time of      preadmission screening, the patient  was requiring total assist for      bed mobility, max assist for transfers, and ambulation had not been      tested.  As the most recent evaluation within the last 24 hours,      the patient is in mod assist for bed mobility, max for transfers,      and gait has not been tested yet.  She is in max assist with ADLs.      The patient was wheelchair level prior to arrival, plus or minus      supervision.  4. Based on the patient's diagnosis, physical exam, and functional      history, the patient has potential for functional progress which      will result in measurable gains while inpatient rehab.  These gains      will be of substantial practical use upon discharge to home in      facilitating mobility, self-care, etc.  Interim changes and medical      status since the initial rehab consult were detailed in the history      of present illness above.  5. Physiatrist will provide 24-hour management of medical needs as      well as oversight of therapy plans last treatment and provide      guidance as appropriate regarding interaction of the two.  The      medical problem list and plan are listed below.  6. 24-hour rehab nursing will assist in management of the patient's      bowel and bladder needs as well as integration of therapy concepts,      techniques, education, pain management, safety awareness, skin care      needs, etc.  7. PT will assess and treat for lower extremity strength, pre-gait      training including functional mobility in transfers.  Assess for      adaptive equipment as appropriate as well with goals at a      supervision level.  8. OT will assess and treat  for ADLs and safety awareness  as well as      adaptive equipment.  Family education and functional mobility with      goals at a min assist to supervision level.  9. Prosthetics will assess the patient for preprosthetic care      including shrinker fit and education.  10.Case management/social worker will assess and treat for      psychosocial issues and discharge planning.  11.A team conference will be held weekly to assess progress towards      goals and determine barriers of discharge.  12.The patient has demonstrated sufficient medical stability and      exercise capacity to tolerate these 3 hours of therapy per day at      least 5 days per week.  13.Estimated length of stay is 10-14 days.  Prognosis fair to good.   MEDICAL PROBLEM LIST AND PLAN:  1. Diabetes:  The patient had been on no medications previously due to      hypoglycemia.  Actos and metformin are started today and we will      observe her response with activity, diet, etc.  2. Blood pressure control.  Continue Benicar 20 mg daily with p.r.n.      hold for systolic blood pressure to less than 120.  Observe with      activity in therapies.  3. Wound care:  Continue ACE wrap and dry dressing daily.  Wound      overall looks healthy.  4. Deep vein thrombosis prophylaxis, subcu Lovenox 50 mg daily.  5. Anticoagulation with aspirin 81 mg p.o. daily.  6. Pain management with Percocet p.r.n..  Observe for signs and      symptoms of lethargy, confusion, etc., and watch for impact of pain      upon therapy tolerance.      Ranelle Oyster, M.D.  Electronically Signed     ZTS/MEDQ  D:  03/19/2008  T:  03/20/2008  Job:  [id number]   cc:   Fleet Contras, M.D.

## 2010-06-13 NOTE — Assessment & Plan Note (Signed)
[facility]   NAMEKARRY, BARRILLEAUX                  ACCOUNT NO.:  [id number]   MEDICAL RECORD NO.:  [id number]      DATE OF BIRTH:  1940-10-23   PHYSICIAN:  Maxwell Caul, M.D. VISIT DATE:  12/28/2005                                   OFFICE VISIT   PURPOSE OF TODAY'S VISIT:  Followup abnormal area on her left plantar  foot.   Ms. Glowacki is a 70 year old diabetic, who returns with an abnormal area  on the volar aspect of her left foot.  She has been treated, in the  past, with custom inserts in shoes.  When she returned for this go  around to this clinic, we paired off a large amount of callus from this  area.  Ultimately, the area was opened and dressed and the area is  healed, but now looks much the same as it did when I first saw this 2  months ago.  She does not have any sensation.  She has no pain.  She had  a culture of this area done on November 26, that was negative.  I do not  see anything to culture right now.   WOUND EXAM:  There is an unchanged area of fluctuation on the plantar  aspect of her foot.  This has no particular evidence of infection.  Palpating deep within this shows some bony enlargement.  I think it  would be reasonable to see an orthopedic surgeon to see if he feels  anything would be worth doing here to prevent this area from recurrently  ulcerating.   MANAGEMENT PLAN & GOAL:  I did not think that we needed to dress this at  all.  She is to use white stocking and her own shoes with off loading  custom inserts.  I have asked her to make an appointment with Dr.  Doristine Section to look at that area of her foot to see if anything should  be done with an osteoectomy to remove the area and hence the prominence  that might lead to underlying ulceration.  In the meantime, the area is  clean and dry and we will follow her up again in a month.           ______________________________  Maxwell Caul, M.D.     MGR/MEDQ  D:   12/28/2005  T:  12/29/2005  Job:  [id number]

## 2010-06-13 NOTE — Discharge Summary (Signed)
NAMEVICKII, Judith [name]                  ACCOUNT NO.:  [id number]   MEDICAL RECORD NO.:  [id number]          PATIENT TYPE:  INP   LOCATION:  5039                         FACILITY:  [facility]   PHYSICIAN:  Lubertha Basque. Dalldorf, M.D.DATE OF BIRTH:  1940/03/06   DATE OF ADMISSION:  03/13/2008  DATE OF DISCHARGE:  03/19/2008                               DISCHARGE SUMMARY   ADMITTING DIAGNOSES:  Right leg infection, diabetes mellitus,  hypertension, and hyperlipidemia.   DISCHARGE DIAGNOSES:  Right leg infection, diabetes mellitus,  hypertension, hyperlipidemia, blood loss anemia, and hypopotassemia.   BRIEF HISTORY:  [name] is a 70 year old female patient known to our  practice with a right necrotic foot.  We have followed this for some  time, is actually a patient of Dr. Doristine Section referred to [place] for  treatment and consultation.  After discussing options with Ms. Sluder and  family members, it was decided because of the extent of the infection  and necrosis to proceed with a BKA amputation.   PERTINENT LABORATORY AND X-RAY FINDINGS:  Blood was replaced as  necessary throughout her hospital stay; hemoglobin 9.3, WBC 7.0,  hematocrit 27.5, platelets 298.  Sodium 137, potassium 4.1, glucose 183,  BUN 6, creatinine 0.64.   COURSE IN THE HOSPITAL:  The patient was admitted postoperatively with  standard orders of IV fluids, IV Ancef 1 g q.8 h., pain medicines,  antiemetics, elevation, kept on her home medicines as well and then  glycemic diabetic order control sheet for diabetics, nonpregnant.  The  first day postop, her lungs were clear, abdomen was soft.  Dressing was  changed on March 15, 2008, no sign of infection, minimal drainage.  A  medical consult was also obtained for management of her diabetes and  other medical conditions while she was in the hospital.  Her wound was  benign.  Her drains were pulled, and there were no significant signs of  infection.  She did have hemoglobin that  dropped on one or two  occasions, and blood was replaced as necessary.  She progressed well and  was discharged.   CONDITION ON DISCHARGE:  Improved.   FOLLOWUP:  She will remain on a low-sodium heart-healthy diet.  She will  have her dressing changed on an every-other-day basis.  We had also  consulted [employer] per possibility of treatment from them for stump wrap  and pre-planning for prosthesis.  She could be bed to chair transfers.  Would be back to see [place] in the office at 2-4 weeks for stitch and staple  removal.  Any sign of infection to call [phone].  She will be kept on  her home medicines, which will be outlined in a moment.   MEDICINES:  1. ACTOplus Met 15/500 two tablets q.a.m. and 1 tablet q.p.m.  2. Prilosec 40 mg 1 a day.  3. Simvastatin 40 mg 1 a day.  4. Diovan 160 daily.  5. Glipizide 10 mg q.a.m.  6. Aspirin 81 mg daily.  7. Calcium daily.  8. Levemir 35 units q.p.m.   Blood sugars to continue to be  checked on a regular schedule.  Sliding  scale of insulin as needed for blood sugar control.  Follow up with the  regular physician but follow up with Dr. Jerl Santos in 14 days plus.      Lindwood Qua, P.A.      Lubertha Basque Jerl Santos, M.D.  Electronically Signed    MC/MEDQ  D:  04/10/2008  T:  04/11/2008  Job:  [id number]

## 2010-07-23 ENCOUNTER — Emergency Department (HOSPITAL_COMMUNITY)
Admission: EM | Admit: 2010-07-23 | Discharge: 2010-07-23 | Disposition: A | Discharge status: Home / Self Care | Payer: Self-pay | Attending: Emergency Medicine | Admitting: Emergency Medicine

## 2010-07-23 DIAGNOSIS — E871 Hypo-osmolality and hyponatremia: Secondary | ICD-10-CM | POA: Insufficient documentation

## 2010-07-23 DIAGNOSIS — I10 Essential (primary) hypertension: Secondary | ICD-10-CM | POA: Insufficient documentation

## 2010-07-23 DIAGNOSIS — E119 Type 2 diabetes mellitus without complications: Secondary | ICD-10-CM | POA: Insufficient documentation

## 2010-07-23 DIAGNOSIS — S88119A Complete traumatic amputation at level between knee and ankle, unspecified lower leg, initial encounter: Secondary | ICD-10-CM | POA: Insufficient documentation

## 2010-07-23 LAB — URINALYSIS, ROUTINE W REFLEX MICROSCOPIC
Glucose, UA: NEGATIVE mg/dL
Ketones, ur: NEGATIVE mg/dL
Leukocytes, UA: NEGATIVE
Nitrite: NEGATIVE
Protein, ur: NEGATIVE mg/dL
pH: 7.5 (ref 5.0–8.0)

## 2010-07-23 LAB — GLUCOSE, CAPILLARY: Glucose-Capillary: 106 mg/dL — ABNORMAL HIGH (ref 70–99)

## 2010-07-23 LAB — POCT I-STAT, CHEM 8
BUN: 5 mg/dL — ABNORMAL LOW (ref 6–23)
Calcium, Ion: 1.07 mmol/L — ABNORMAL LOW (ref 1.12–1.32)
Chloride: 92 mEq/L — ABNORMAL LOW (ref 96–112)
HCT: 36 % (ref 36.0–46.0)
Sodium: 126 mEq/L — ABNORMAL LOW (ref 135–145)
TCO2: 25 mmol/L (ref 0–100)

## 2010-07-23 LAB — URINE MICROSCOPIC-ADD ON

## 2010-11-27 ENCOUNTER — Encounter (HOSPITAL_COMMUNITY): Payer: Self-pay | Admitting: Radiology

## 2010-11-27 ENCOUNTER — Emergency Department (HOSPITAL_COMMUNITY)
Admission: EM | Admit: 2010-11-27 | Discharge: 2010-11-27 | Disposition: A | Discharge status: Home / Self Care | Payer: Medicare Other | Attending: Emergency Medicine | Admitting: Emergency Medicine

## 2010-11-27 ENCOUNTER — Emergency Department (HOSPITAL_COMMUNITY): Payer: Medicare Other

## 2010-11-27 DIAGNOSIS — S88119A Complete traumatic amputation at level between knee and ankle, unspecified lower leg, initial encounter: Secondary | ICD-10-CM | POA: Insufficient documentation

## 2010-11-27 DIAGNOSIS — E119 Type 2 diabetes mellitus without complications: Secondary | ICD-10-CM | POA: Insufficient documentation

## 2010-11-27 DIAGNOSIS — I10 Essential (primary) hypertension: Secondary | ICD-10-CM | POA: Insufficient documentation

## 2010-11-27 DIAGNOSIS — Z794 Long term (current) use of insulin: Secondary | ICD-10-CM | POA: Insufficient documentation

## 2010-11-27 DIAGNOSIS — R259 Unspecified abnormal involuntary movements: Secondary | ICD-10-CM | POA: Insufficient documentation

## 2010-11-27 HISTORY — DX: Essential (primary) hypertension: I10

## 2010-11-27 LAB — DIFFERENTIAL
Basophils Absolute: 0.1 10*3/uL (ref 0.0–0.1)
Eosinophils Absolute: 0 10*3/uL (ref 0.0–0.7)
Eosinophils Relative: 1 % (ref 0–5)
Lymphocytes Relative: 11 % — ABNORMAL LOW (ref 12–46)
Lymphs Abs: 0.8 10*3/uL (ref 0.7–4.0)
Monocytes Absolute: 0.4 10*3/uL (ref 0.1–1.0)

## 2010-11-27 LAB — CBC
HCT: 34.6 % — ABNORMAL LOW (ref 36.0–46.0)
MCHC: 31.5 g/dL (ref 30.0–36.0)
MCV: 83.4 fL (ref 78.0–100.0)
Platelets: 283 10*3/uL (ref 150–400)
RDW: 15.1 % (ref 11.5–15.5)
WBC: 7.3 10*3/uL (ref 4.0–10.5)

## 2010-11-27 LAB — COMPREHENSIVE METABOLIC PANEL
BUN: 15 mg/dL (ref 6–23)
CO2: 27 mEq/L (ref 19–32)
Calcium: 9.5 mg/dL (ref 8.4–10.5)
Chloride: 100 mEq/L (ref 96–112)
Creatinine, Ser: 0.78 mg/dL (ref 0.50–1.10)
GFR calc Af Amer: >90 mL/min (ref >90)
GFR calc non Af Amer: 82 mL/min — ABNORMAL LOW (ref >90)
Total Bilirubin: 0.3 mg/dL (ref 0.3–1.2)

## 2010-11-27 LAB — MAGNESIUM: Magnesium: 2.3 mg/dL (ref 1.5–2.5)

## 2010-11-27 LAB — CK TOTAL AND CKMB (NOT AT ARMC): Relative Index: INVALID (ref 0.0–2.5)

## 2010-11-27 LAB — PHOSPHORUS: Phosphorus: 2.4 mg/dL (ref 2.3–4.6)

## 2010-11-27 LAB — POCT I-STAT TROPONIN I: Troponin i, poc: 0.01 ng/mL (ref 0.00–0.08)

## 2012-02-24 ENCOUNTER — Emergency Department (HOSPITAL_COMMUNITY): Payer: Medicare Other

## 2012-02-24 ENCOUNTER — Emergency Department (HOSPITAL_COMMUNITY)
Admission: EM | Admit: 2012-02-24 | Discharge: 2012-02-24 | Disposition: A | Discharge status: Home / Self Care | Payer: Medicare Other | Attending: Emergency Medicine | Admitting: Emergency Medicine

## 2012-02-24 ENCOUNTER — Encounter (HOSPITAL_COMMUNITY): Payer: Self-pay | Admitting: Emergency Medicine

## 2012-02-24 DIAGNOSIS — Z79899 Other long term (current) drug therapy: Secondary | ICD-10-CM | POA: Insufficient documentation

## 2012-02-24 DIAGNOSIS — N390 Urinary tract infection, site not specified: Secondary | ICD-10-CM | POA: Insufficient documentation

## 2012-02-24 DIAGNOSIS — S88119A Complete traumatic amputation at level between knee and ankle, unspecified lower leg, initial encounter: Secondary | ICD-10-CM | POA: Insufficient documentation

## 2012-02-24 DIAGNOSIS — Y9389 Activity, other specified: Secondary | ICD-10-CM | POA: Insufficient documentation

## 2012-02-24 DIAGNOSIS — Z794 Long term (current) use of insulin: Secondary | ICD-10-CM | POA: Insufficient documentation

## 2012-02-24 DIAGNOSIS — S7290XA Unspecified fracture of unspecified femur, initial encounter for closed fracture: Secondary | ICD-10-CM

## 2012-02-24 DIAGNOSIS — E119 Type 2 diabetes mellitus without complications: Secondary | ICD-10-CM | POA: Insufficient documentation

## 2012-02-24 DIAGNOSIS — Y929 Unspecified place or not applicable: Secondary | ICD-10-CM | POA: Insufficient documentation

## 2012-02-24 DIAGNOSIS — B961 Klebsiella pneumoniae [K. pneumoniae] as the cause of diseases classified elsewhere: Secondary | ICD-10-CM | POA: Insufficient documentation

## 2012-02-24 DIAGNOSIS — D0000 Carcinoma in situ of oral cavity, unspecified site: Secondary | ICD-10-CM | POA: Insufficient documentation

## 2012-02-24 DIAGNOSIS — I10 Essential (primary) hypertension: Secondary | ICD-10-CM | POA: Insufficient documentation

## 2012-02-24 DIAGNOSIS — W010XXA Fall on same level from slipping, tripping and stumbling without subsequent striking against object, initial encounter: Secondary | ICD-10-CM | POA: Insufficient documentation

## 2012-02-24 DIAGNOSIS — S72409A Unspecified fracture of lower end of unspecified femur, initial encounter for closed fracture: Secondary | ICD-10-CM | POA: Insufficient documentation

## 2012-02-24 DIAGNOSIS — S82209A Unspecified fracture of shaft of unspecified tibia, initial encounter for closed fracture: Secondary | ICD-10-CM

## 2012-02-24 LAB — URINALYSIS, ROUTINE W REFLEX MICROSCOPIC
Glucose, UA: NEGATIVE mg/dL
Ketones, ur: NEGATIVE mg/dL
Protein, ur: NEGATIVE mg/dL
Urobilinogen, UA: 0.2 mg/dL (ref 0.0–1.0)

## 2012-02-24 LAB — BASIC METABOLIC PANEL
CO2: 29 mEq/L (ref 19–32)
Calcium: 9.8 mg/dL (ref 8.4–10.5)
Creatinine, Ser: 0.64 mg/dL (ref 0.50–1.10)
GFR calc non Af Amer: 87 mL/min — ABNORMAL LOW (ref >90)

## 2012-02-24 LAB — CBC WITH DIFFERENTIAL/PLATELET
Basophils Absolute: 0 10*3/uL (ref 0.0–0.1)
Basophils Relative: 0 % (ref 0–1)
Eosinophils Absolute: 0 10*3/uL (ref 0.0–0.7)
Eosinophils Relative: 0 % (ref 0–5)
HCT: 34.4 % — ABNORMAL LOW (ref 36.0–46.0)
Lymphocytes Relative: 8 % — ABNORMAL LOW (ref 12–46)
MCH: 26.6 pg (ref 26.0–34.0)
MCHC: 32.8 g/dL (ref 30.0–36.0)
MCV: 80.9 fL (ref 78.0–100.0)
Monocytes Absolute: 0.5 10*3/uL (ref 0.1–1.0)
Platelets: 280 10*3/uL (ref 150–400)
RDW: 15.1 % (ref 11.5–15.5)

## 2012-02-24 LAB — URINE MICROSCOPIC-ADD ON

## 2012-02-24 LAB — GLUCOSE, CAPILLARY: Glucose-Capillary: 199 mg/dL — ABNORMAL HIGH (ref 70–99)

## 2012-02-24 MED ORDER — SODIUM CHLORIDE 0.9 % IV SOLN
INTRAVENOUS | Status: DC
  Administered 2012-02-24: 17:00:00 via INTRAVENOUS

## 2012-02-24 MED ORDER — CEPHALEXIN 500 MG PO CAPS: 500.0000 mg | ORAL_CAPSULE | Freq: Four times a day (QID) | ORAL | Status: DC

## 2012-02-24 MED ORDER — FENTANYL CITRATE 0.05 MG/ML IJ SOLN
100.0000 ug | Freq: Once | INTRAMUSCULAR | Status: AC
  Administered 2012-02-24: 100 ug via INTRAVENOUS
  Filled 2012-02-24: qty 2

## 2012-02-24 MED ORDER — ONDANSETRON HCL 4 MG/2ML IJ SOLN
4.0000 mg | Freq: Once | INTRAMUSCULAR | Status: AC
  Administered 2012-02-24: 4 mg via INTRAVENOUS
  Filled 2012-02-24: qty 2

## 2012-02-24 MED ORDER — OXYCODONE-ACETAMINOPHEN 5-325 MG PO TABS: 1.0000 | ORAL_TABLET | ORAL | Status: DC | PRN

## 2012-02-24 MED ORDER — ONDANSETRON HCL 4 MG PO TABS: 4.0000 mg | ORAL_TABLET | Freq: Three times a day (TID) | ORAL | Status: DC | PRN

## 2012-02-24 NOTE — ED Notes (Signed)
Ortho tech at bedside 

## 2012-02-24 NOTE — ED Provider Notes (Signed)
History     CSN: [id number]  Arrival date & time 02/24/12  1344   First MD Initiated Contact with Patient 02/24/12 1349      Chief Complaint  Patient presents with  . Fall  . Leg Pain    (Consider location/radiation/quality/duration/timing/severity/associated sxs/prior treatment) HPI Comments: Judith [name] is a 72 y.o. Female who was sitting in a chair, stood up to walk, stumbled a few steps and fell onto her right leg. Her husband was with her, and caught her as she went to the floor. She has an isolated injury to the right knee. She denies head, neck, or back pain. She denies recent illnesses. No fever, chills, nausea, vomiting, cough, change in bowel or urinary habits. She has pain with movement of the right leg, and it improves with rest. There are no other modifying factors.  Patient is a 72 y.o. female presenting with fall and leg pain. The history is provided by the patient.  Fall  Leg Pain     Past Medical History  Diagnosis Date  . Hypertension   . Diabetes mellitus     Past Surgical History  Procedure Date  . Below knee leg amputation     Right    No family history on file.  History  Substance Use Topics  . Smoking status: Never Smoker   . Smokeless tobacco: Not on file  . Alcohol Use: No    OB History    Grav Para Term Preterm Abortions TAB SAB Ect Mult Living                  Review of Systems  All other systems reviewed and are negative.    Allergies  Review of patient's allergies indicates no known allergies.  Home Medications   Current Outpatient Rx  Name  Route  Sig  Dispense  Refill  . CHLORPHEN-PE-ACETAMINOPHEN 4-10-325 MG PO TABS   Oral   Take 1 tablet by mouth 2 (two) times daily as needed. For congestion         . CLONIDINE HCL 0.1 MG PO TABS   Oral   Take 0.1 mg by mouth 2 (two) times daily.         Marland Kitchen GLIPIZIDE ER 5 MG PO TB24   Oral   Take 10 mg by mouth daily.         . INSULIN DETEMIR 100 UNIT/ML [place] SOLN  Subcutaneous   Inject 30 Units into the skin at bedtime.         Marland Kitchen SIMVASTATIN 40 MG PO TABS   Oral   Take 40 mg by mouth every evening.           BP 161/65  Pulse 89  Temp 97.9 F (36.6 C) (Oral)  Resp 16  SpO2 100%  Physical Exam  Nursing note and vitals reviewed. Constitutional: She is oriented to person, place, and time. She appears well-developed and well-nourished.  HENT:  Head: Normocephalic and atraumatic.  Eyes: Conjunctivae normal and EOM are normal. Pupils are equal, round, and reactive to light.  Neck: Normal range of motion and phonation normal. Neck supple.  Cardiovascular: Normal rate, regular rhythm and intact distal pulses.   Pulmonary/Chest: Effort normal and breath sounds normal. She exhibits no tenderness.  Abdominal: Soft. She exhibits no distension. There is no tenderness. There is no guarding.  Musculoskeletal: Normal range of motion.  Neurological: She is alert and oriented to person, place, and time. She has normal strength. She exhibits  normal muscle tone.  Skin: Skin is warm and dry.  Psychiatric: She has a normal mood and affect. Her behavior is normal. Judgment and thought content normal.    ED Course  Procedures (including critical care time)  Emergency department treatment: IV, fentanyl, and Zofran  Imaging right leg and knee     Date: 11/13/2011  Rate: 88  Rhythm: normal sinus rhythm  QRS Axis: normal  PR and QT Intervals: PR prolonged  ST/T Wave abnormalities: nonspecific T wave changes  PR and QRS Conduction Disutrbances:first-degree A-V block   Narrative Interpretation:   Old EKG Reviewed: changes noted- PR is longer today    Reevaluation: 16:10-she is comfortable. Now, when not moving leg. I discussed the case with orthopedics; Dr. Jerl Santos, reviewed her films, and would like to manage her, as an outpatient. The patient's son and the patient, agree with this treatment plan.  A long-leg splint is applied to the right leg;  after additional pain control   Screening evaluation for medical causes of fall was ordered;       Labs Reviewed  GLUCOSE, CAPILLARY - Abnormal; Notable for the following:    Glucose-Capillary 199 (*)     All other components within normal limits  CBC WITH DIFFERENTIAL - Abnormal; Notable for the following:    WBC 10.9 (*)     Hemoglobin 11.3 (*)     HCT 34.4 (*)     Neutrophils Relative 86 (*)     Neutro Abs 9.4 (*)     Lymphocytes Relative 8 (*)     All other components within normal limits  BASIC METABOLIC PANEL - Abnormal; Notable for the following:    Glucose, Bld 216 (*)     GFR calc non Af Amer 87 (*)     All other components within normal limits  URINALYSIS, ROUTINE W REFLEX MICROSCOPIC  URINE CULTURE   Dg Femur Right  02/24/2012  *RADIOLOGY REPORT*  Clinical Data: Fall.  Pain in the distal femur.  RIGHT FEMUR - 2 VIEW  Comparison: None available.  Findings: The proximal femur demonstrates mild osteopenia.  The hip is located.  No acute proximal abnormality is evident. Atherosclerotic calcifications are present in the femoral artery. The distal femur is imaged on the knee films of the same day.  IMPRESSION:  1.  No acute abnormality of the proximal femur. 2.  Mild osteopenia. 3.  Atherosclerosis.   Original Report Authenticated By: Marin Roberts, M.D.    Dg Knee 1-2 Views Right  02/24/2012  *RADIOLOGY REPORT*  Clinical Data: Fall.  Distal femur pain.  RIGHT KNEE - 1-2 VIEW  Comparison: Proximal femur films of the same day.  Findings: A comminuted distal femur fracture is present.  There is slight dorsal angulation.  The patient is status post below-the- knee amputation.  A lateral tibial plateau fracture is also present, slightly depressed.  IMPRESSION:  1.  Comminuted lateral distal femur fracture with slight posterior angulation. 2.  Suspects and lateral tibial plateau fracture is well. 3.  Moderate osteopenia. 4.  Status post below-the-knee amputation. 5.   Atherosclerosis.   Original Report Authenticated By: Marin Roberts, M.D.    Nursing notes, applicable records and vitals reviewed.  Radiologic Images/Reports reviewed.   1. Femur fracture   2. Tibia fracture       MDM  Fall with right leg fracture. Fracture is nonsurgical.       Plan: Home Medications- Percocet; Home Treatments- Non-weight bearing right leg; Recommended follow up- Ortho 3-5  days     Flint Melter, MD 02/25/12 [phone]

## 2012-02-24 NOTE — ED Notes (Signed)
Pt arrived by EMS from home. Pt has a right BKA and uses a prosthesis. While using walker and standing up to go use the bathroom pt "legs gave out" and husband assisted pt to the carpet floor. Pt c/o right leg pain with movement 10/10 and stated that it only hurts a little while resting it. Denies passing out, dizziness or hitting head. CBG: 156 BP-176/90 hr-100.

## 2012-02-24 NOTE — ED Notes (Signed)
Placed call to [facility] for transport back home 

## 2012-02-24 NOTE — ED Provider Notes (Signed)
Urinalysis positive. Patient given prescription for Keflex, urine culture sent. Discharge according to plan established by Dr. Effie Shy  BP 133/51  Pulse 91  Temp 97.9 F (36.6 C) (Oral)  Resp 20  SpO2 100%   Glynn Octave, MD 02/24/12 1935

## 2012-02-26 LAB — URINE CULTURE: Colony Count: >=100000

## 2012-02-27 NOTE — ED Notes (Signed)
+  Urine. Patient treated with Keflex. Sensitive to same. Per protocol MD. °

## 2012-02-29 ENCOUNTER — Inpatient Hospital Stay (HOSPITAL_COMMUNITY): Payer: Medicare Other

## 2012-02-29 ENCOUNTER — Inpatient Hospital Stay (HOSPITAL_COMMUNITY)
Admission: AD | Admit: 2012-02-29 | Discharge: 2012-03-04 | DRG: 481 | Disposition: A | Discharge status: Rehabilitation Facility | Payer: Medicare Other | Source: Ambulatory Visit | Attending: Orthopedic Surgery | Admitting: Orthopedic Surgery

## 2012-02-29 ENCOUNTER — Other Ambulatory Visit: Payer: Self-pay | Admitting: Physician Assistant

## 2012-02-29 ENCOUNTER — Encounter (HOSPITAL_COMMUNITY): Payer: Self-pay | Admitting: Orthopedic Surgery

## 2012-02-29 DIAGNOSIS — S72453A Displaced supracondylar fracture without intracondylar extension of lower end of unspecified femur, initial encounter for closed fracture: Principal | ICD-10-CM | POA: Diagnosis present

## 2012-02-29 DIAGNOSIS — E119 Type 2 diabetes mellitus without complications: Secondary | ICD-10-CM | POA: Diagnosis present

## 2012-02-29 DIAGNOSIS — E669 Obesity, unspecified: Secondary | ICD-10-CM | POA: Diagnosis present

## 2012-02-29 DIAGNOSIS — E785 Hyperlipidemia, unspecified: Secondary | ICD-10-CM | POA: Diagnosis present

## 2012-02-29 DIAGNOSIS — N390 Urinary tract infection, site not specified: Secondary | ICD-10-CM | POA: Diagnosis present

## 2012-02-29 DIAGNOSIS — Y92009 Unspecified place in unspecified non-institutional (private) residence as the place of occurrence of the external cause: Secondary | ICD-10-CM

## 2012-02-29 DIAGNOSIS — E639 Nutritional deficiency, unspecified: Secondary | ICD-10-CM | POA: Diagnosis present

## 2012-02-29 DIAGNOSIS — I10 Essential (primary) hypertension: Secondary | ICD-10-CM | POA: Diagnosis present

## 2012-02-29 DIAGNOSIS — IMO0001 Reserved for inherently not codable concepts without codable children: Secondary | ICD-10-CM

## 2012-02-29 DIAGNOSIS — S88119A Complete traumatic amputation at level between knee and ankle, unspecified lower leg, initial encounter: Secondary | ICD-10-CM

## 2012-02-29 DIAGNOSIS — S72401A Unspecified fracture of lower end of right femur, initial encounter for closed fracture: Secondary | ICD-10-CM | POA: Diagnosis present

## 2012-02-29 DIAGNOSIS — Z794 Long term (current) use of insulin: Secondary | ICD-10-CM

## 2012-02-29 DIAGNOSIS — W19XXXA Unspecified fall, initial encounter: Secondary | ICD-10-CM | POA: Diagnosis present

## 2012-02-29 DIAGNOSIS — D62 Acute posthemorrhagic anemia: Secondary | ICD-10-CM

## 2012-02-29 DIAGNOSIS — Z79899 Other long term (current) drug therapy: Secondary | ICD-10-CM

## 2012-02-29 DIAGNOSIS — Z89511 Acquired absence of right leg below knee: Secondary | ICD-10-CM

## 2012-02-29 HISTORY — DX: Type 2 diabetes mellitus with diabetic neuropathic arthropathy: E11.610

## 2012-02-29 HISTORY — DX: Long term (current) use of insulin: Z79.4

## 2012-02-29 HISTORY — DX: Type 2 diabetes mellitus without complications: E11.9

## 2012-02-29 HISTORY — DX: Hyperlipidemia, unspecified: E78.5

## 2012-02-29 HISTORY — DX: Obesity, unspecified: E66.9

## 2012-02-29 HISTORY — DX: Other fracture of right lower leg, initial encounter for closed fracture: S82.891A

## 2012-02-29 HISTORY — DX: Unspecified fracture of lower end of right femur, initial encounter for closed fracture: S72.401A

## 2012-02-29 LAB — GLUCOSE, CAPILLARY: Glucose-Capillary: 200 mg/dL — ABNORMAL HIGH (ref 70–99)

## 2012-02-29 LAB — CBC
MCH: 26.1 pg (ref 26.0–34.0)
MCHC: 32.3 g/dL (ref 30.0–36.0)
MCV: 80.8 fL (ref 78.0–100.0)
Platelets: 294 10*3/uL (ref 150–400)
RBC: 3.64 MIL/uL — ABNORMAL LOW (ref 3.87–5.11)
RDW: 15 % (ref 11.5–15.5)

## 2012-02-29 LAB — URINE MICROSCOPIC-ADD ON

## 2012-02-29 LAB — BASIC METABOLIC PANEL
CO2: 30 mEq/L (ref 19–32)
Calcium: 9.3 mg/dL (ref 8.4–10.5)
Creatinine, Ser: 0.71 mg/dL (ref 0.50–1.10)
GFR calc non Af Amer: 84 mL/min — ABNORMAL LOW (ref >90)
Glucose, Bld: 135 mg/dL — ABNORMAL HIGH (ref 70–99)
Sodium: 141 mEq/L (ref 135–145)

## 2012-02-29 LAB — URINALYSIS, ROUTINE W REFLEX MICROSCOPIC
Bilirubin Urine: NEGATIVE
Glucose, UA: NEGATIVE mg/dL
Ketones, ur: NEGATIVE mg/dL
Protein, ur: NEGATIVE mg/dL
Urobilinogen, UA: 0.2 mg/dL (ref 0.0–1.0)

## 2012-02-29 LAB — PROTIME-INR: Prothrombin Time: 13.8 seconds (ref 11.6–15.2)

## 2012-02-29 MED ORDER — LACTATED RINGERS IV SOLN
INTRAVENOUS | Status: DC
  Administered 2012-02-29: 50 mL/h via INTRAVENOUS

## 2012-02-29 MED ORDER — MORPHINE SULFATE 2 MG/ML IJ SOLN: 0.5000 mg | INTRAMUSCULAR | Status: DC | PRN

## 2012-02-29 MED ORDER — HYDROCODONE-ACETAMINOPHEN 5-325 MG PO TABS
1.0000 | ORAL_TABLET | Freq: Four times a day (QID) | ORAL | Status: DC | PRN
  Administered 2012-02-29: 2 via ORAL
  Filled 2012-02-29: qty 2

## 2012-02-29 MED ORDER — CEFAZOLIN SODIUM-DEXTROSE 2-3 GM-% IV SOLR
2.0000 g | Freq: Once | INTRAVENOUS | Status: AC
  Administered 2012-03-01: 2 g via INTRAVENOUS
  Filled 2012-02-29: qty 50

## 2012-02-29 MED ORDER — ADULT MULTIVITAMIN W/MINERALS CH
1.0000 | ORAL_TABLET | Freq: Every day | ORAL | Status: DC
  Administered 2012-02-29 – 2012-03-04 (×5): 1 via ORAL
  Filled 2012-02-29 (×5): qty 1

## 2012-02-29 NOTE — Progress Notes (Signed)
Orthopedic Tech Progress Note Patient Details:  Judith [name] 1940/05/16 [id number] Applied overhead frame to bed.     [name] 02/29/2012, 4:44 PM

## 2012-02-29 NOTE — Progress Notes (Signed)
INITIAL NUTRITION ASSESSMENT  DOCUMENTATION CODES Per approved criteria  -Obesity Unspecified   INTERVENTION: 1. MVI daily 2. Recommend Glucerna Shake po daily, each supplement provides 220 kcal and 10 grams of protein, if intake of meals declines. 3. RD to continue to follow nutrition care plan  NUTRITION DIAGNOSIS: Increased nutrient needs related to post-op healing as evidenced by estimated needs.   Goal: Pt to meet >/= 90% of their estimated nutrition needs.  Monitor:  weight trends, lab trends, I/O's, PO intake, supplement tolerance  Reason for Assessment: MD Consult for Nutrition Assessment  72 y.o. female  Admitting Dx: R leg pain  ASSESSMENT: S/p BKA in 2010. S/p fall approximately 1 week ago and sustained supracondylar/intracondylar femoral fracture. Admitted for surgery for her fracture.  Pt states that her appetite and weight were stable PTA. She is currently eating lunch and has consumed >75% of it so far. She states that she does not have dentures with her but she does not need a texture-modified diet at this time.  Discussed nutritional supplements for increased kcal/protein needs however pt declined, states that she feels as though her intake is adequate at this time.  Height: Ht Readings from Last 1 Encounters:  02/29/12 5\' 4"  (1.626 m)   Weight: Wt Readings from Last 1 Encounters:  02/29/12 195 lb (88.451 kg)   Ideal Body Weight: 113 lb (adjusted for BKA)  % Ideal Body Weight: 172%  Wt Readings from Last 10 Encounters:  02/29/12 195 lb (88.451 kg)    Usual Body Weight: 190 - 195 lb (per pt)  % Usual Body Weight: 100%  BMI:  Body mass index is 33.47 kg/(m^2). Obese Class I  Estimated Nutritional Needs: Kcal: 1600 - 1700 kcal Protein: 80 - 90 grams Fluid: 1.8 - 2 liters daily  Diet Order: Carb Control Medium  EDUCATION NEEDS: -No education needs identified at this time  No intake or output data in the 24 hours ending 02/29/12  1435  Last BM: PTA  Labs:   Lab 02/24/12 1617  NA 140  K 4.0  CL 98  CO2 29  BUN 14  CREATININE 0.64  CALCIUM 9.8  MG --  PHOS --  GLUCOSE 216*    CBG (last 3)   Basename 02/29/12 1338  GLUCAP 118*    Scheduled Meds:   Continuous Infusions:    . lactated ringers      Past Medical History  Diagnosis Date  . Hypertension   . Diabetes mellitus     Past Surgical History  Procedure Date  . Below knee leg amputation     Right    Jarold Motto MS, RD, LDN Pager: [phone] After-hours pager: [phone]

## 2012-02-29 NOTE — H&P (Signed)
Judith [name] is an 72 y.o. female.   Chief Complaint: right leg pain. HPI: Judith [name] is a 73 year old female who has gone through AP BKA in 2010.she did very well and I back to walking.  Unfortunately about a week ago she fell at home.  She denies any chest pain or dizziness prior to falling.  No loss of consciousness.  She was taken to the emergency room and was noted to have a mildly displaced fracture of the distal femur.  She was put into a splint.  She has maintained in nonweightbearing status and is in a wheelchair.  She presented to our office today for further evaluation.  X-rays were taken which did show a displaced distal femoral fracture which is intracondylar.  We had discussed treatment options with her that being surgical as our best option.  We have contacted Dr. Myrene [name] who is going to take over her care.  Past Medical History  Diagnosis Date  . Hypertension   . Diabetes mellitus     Past Surgical History  Procedure Date  . Below knee leg amputation     Right    No family history on file. Social History:  reports that she has never smoked. She does not have any smokeless tobacco history on file. She reports that she does not drink alcohol or use illicit drugs.  Allergies: No Known Allergies   (Not in a hospital admission)  No results found for this or any previous visit (from the past 48 hour(s)). No results found.  Review of Systems  Constitutional: Negative.   HENT: Negative.   Eyes: Negative.   Respiratory: Negative.   Cardiovascular: Negative.   Gastrointestinal: Negative.   Genitourinary: Negative.   Musculoskeletal: Positive for joint pain.  Skin: Negative.   Neurological: Negative.   Endo/Heme/Allergies: Negative.   Psychiatric/Behavioral: Negative.     There were no vitals taken for this visit. Physical Exam  Constitutional: She is oriented to person, place, and time. She appears well-developed.  HENT:  Head: Normocephalic.  Eyes: Pupils are  equal, round, and reactive to light.  Neck: Normal range of motion.  Cardiovascular: Regular rhythm.   Respiratory: Breath sounds normal.  GI: Bowel sounds are normal.  Musculoskeletal:       Right leg exam: BKA stump is benign.  Scar is well-healed.  Severe pain with motion of distal femur area.  Upper extremities move well with good neurovascular status.  Neurological: She is oriented to person, place, and time.  Skin: Skin is dry.  Psychiatric: She has a normal mood and affect.     Assessment/Plan Assessment: Right knee supracondylar/intracondylar femoral fracture status post BKA 2010 Plan: This patient would like to try to walk again and I think the best option for her to do that is to have this fracture fixed.  We have contacted Dr.  Myrene [name]  and he has agreed to take over care.surgery most likely will be tomorrow afternoon.  I have made her n.p.o. After midnight tonight.  Judith [name] R 02/29/2012, 11:18 AM

## 2012-03-01 ENCOUNTER — Inpatient Hospital Stay (HOSPITAL_COMMUNITY): Payer: Medicare Other

## 2012-03-01 ENCOUNTER — Encounter (HOSPITAL_COMMUNITY): Payer: Self-pay | Admitting: Certified Registered Nurse Anesthetist

## 2012-03-01 ENCOUNTER — Encounter (HOSPITAL_COMMUNITY)
Admission: AD | Disposition: A | Discharge status: Rehabilitation Facility | Payer: Self-pay | Source: Ambulatory Visit | Attending: Orthopedic Surgery

## 2012-03-01 ENCOUNTER — Inpatient Hospital Stay (HOSPITAL_COMMUNITY): Payer: Medicare Other | Admitting: Certified Registered Nurse Anesthetist

## 2012-03-01 ENCOUNTER — Encounter (HOSPITAL_COMMUNITY): Payer: Self-pay | Admitting: Orthopedic Surgery

## 2012-03-01 DIAGNOSIS — S72401A Unspecified fracture of lower end of right femur, initial encounter for closed fracture: Secondary | ICD-10-CM

## 2012-03-01 DIAGNOSIS — N390 Urinary tract infection, site not specified: Secondary | ICD-10-CM | POA: Diagnosis present

## 2012-03-01 DIAGNOSIS — IMO0001 Reserved for inherently not codable concepts without codable children: Secondary | ICD-10-CM

## 2012-03-01 DIAGNOSIS — I10 Essential (primary) hypertension: Secondary | ICD-10-CM | POA: Diagnosis present

## 2012-03-01 HISTORY — PX: ORIF FEMUR FRACTURE: SHX2119

## 2012-03-01 HISTORY — DX: Unspecified fracture of lower end of right femur, initial encounter for closed fracture: S72.401A

## 2012-03-01 HISTORY — DX: Reserved for inherently not codable concepts without codable children: IMO0001

## 2012-03-01 LAB — GLUCOSE, CAPILLARY
Glucose-Capillary: 185 mg/dL — ABNORMAL HIGH (ref 70–99)
Glucose-Capillary: 197 mg/dL — ABNORMAL HIGH (ref 70–99)
Glucose-Capillary: 190 mg/dL — ABNORMAL HIGH (ref 70–99)

## 2012-03-01 LAB — BASIC METABOLIC PANEL
BUN: 11 mg/dL (ref 6–23)
Calcium: 9.3 mg/dL (ref 8.4–10.5)
GFR calc Af Amer: >90 mL/min (ref >90)
GFR calc non Af Amer: 86 mL/min — ABNORMAL LOW (ref >90)
Glucose, Bld: 160 mg/dL — ABNORMAL HIGH (ref 70–99)
Potassium: 3.6 mEq/L (ref 3.5–5.1)
Sodium: 140 mEq/L (ref 135–145)

## 2012-03-01 LAB — TYPE AND SCREEN
ABO/RH(D): B NEG
Antibody Screen: NEGATIVE

## 2012-03-01 LAB — SURGICAL PCR SCREEN
MRSA, PCR: NEGATIVE
Staphylococcus aureus: NEGATIVE

## 2012-03-01 LAB — CBC
Hemoglobin: 9.3 g/dL — ABNORMAL LOW (ref 12.0–15.0)
MCH: 26.1 pg (ref 26.0–34.0)
MCHC: 32.5 g/dL (ref 30.0–36.0)
RDW: 14.8 % (ref 11.5–15.5)

## 2012-03-01 SURGERY — OPEN REDUCTION INTERNAL FIXATION (ORIF) DISTAL FEMUR FRACTURE
Anesthesia: General | Site: Leg Upper | Laterality: Right | Wound class: Clean

## 2012-03-01 MED ORDER — CEPHALEXIN 500 MG PO CAPS
500.0000 mg | ORAL_CAPSULE | Freq: Four times a day (QID) | ORAL | Status: DC
  Administered 2012-03-02 – 2012-03-04 (×8): 500 mg via ORAL
  Filled 2012-03-01 (×11): qty 1

## 2012-03-01 MED ORDER — ONDANSETRON HCL 4 MG/2ML IJ SOLN: 4.0000 mg | Freq: Four times a day (QID) | INTRAMUSCULAR | Status: DC | PRN

## 2012-03-01 MED ORDER — PHENYLEPHRINE HCL 10 MG/ML IJ SOLN
INTRAMUSCULAR | Status: DC | PRN
  Administered 2012-03-01: 80 ug via INTRAVENOUS
  Administered 2012-03-01: 40 ug via INTRAVENOUS
  Administered 2012-03-01 (×2): 80 ug via INTRAVENOUS
  Administered 2012-03-01: 40 ug via INTRAVENOUS
  Administered 2012-03-01: 120 ug via INTRAVENOUS
  Administered 2012-03-01 (×2): 80 ug via INTRAVENOUS

## 2012-03-01 MED ORDER — GLYCOPYRROLATE 0.2 MG/ML IJ SOLN
INTRAMUSCULAR | Status: DC | PRN
  Administered 2012-03-01: 0.4 mg via INTRAVENOUS

## 2012-03-01 MED ORDER — METOCLOPRAMIDE HCL 5 MG/ML IJ SOLN: 5.0000 mg | Freq: Three times a day (TID) | INTRAMUSCULAR | Status: DC | PRN

## 2012-03-01 MED ORDER — METHOCARBAMOL 500 MG PO TABS: 500.0000 mg | ORAL_TABLET | Freq: Four times a day (QID) | ORAL | Status: DC | PRN

## 2012-03-01 MED ORDER — ACETAMINOPHEN 10 MG/ML IV SOLN
1000.0000 mg | Freq: Four times a day (QID) | INTRAVENOUS | Status: DC
  Administered 2012-03-02: 1000 mg via INTRAVENOUS
  Filled 2012-03-01 (×5): qty 100

## 2012-03-01 MED ORDER — CEFAZOLIN SODIUM 1-5 GM-% IV SOLN
1.0000 g | Freq: Four times a day (QID) | INTRAVENOUS | Status: AC
  Administered 2012-03-02 (×3): 1 g via INTRAVENOUS
  Filled 2012-03-01 (×3): qty 50

## 2012-03-01 MED ORDER — LACTATED RINGERS IV SOLN
INTRAVENOUS | Status: DC | PRN
  Administered 2012-03-01 (×3): via INTRAVENOUS

## 2012-03-01 MED ORDER — INSULIN ASPART 100 UNIT/ML ~~LOC~~ SOLN
SUBCUTANEOUS | Status: AC
  Filled 2012-03-01: qty 1

## 2012-03-01 MED ORDER — METHOCARBAMOL 100 MG/ML IJ SOLN
500.0000 mg | Freq: Four times a day (QID) | INTRAVENOUS | Status: DC | PRN
  Filled 2012-03-01: qty 5

## 2012-03-01 MED ORDER — MORPHINE SULFATE 10 MG/ML IJ SOLN
INTRAMUSCULAR | Status: DC | PRN
  Administered 2012-03-01 (×2): 5 mg via INTRAVENOUS

## 2012-03-01 MED ORDER — OXYCODONE HCL 5 MG PO TABS: 5.0000 mg | ORAL_TABLET | ORAL | Status: DC | PRN

## 2012-03-01 MED ORDER — ROCURONIUM BROMIDE 100 MG/10ML IV SOLN
INTRAVENOUS | Status: DC | PRN
  Administered 2012-03-01: 40 mg via INTRAVENOUS

## 2012-03-01 MED ORDER — FENTANYL CITRATE 0.05 MG/ML IJ SOLN
INTRAMUSCULAR | Status: AC
  Administered 2012-03-01: 50 ug via INTRAVENOUS
  Filled 2012-03-01: qty 2

## 2012-03-01 MED ORDER — ONDANSETRON HCL 4 MG/2ML IJ SOLN
INTRAMUSCULAR | Status: DC | PRN
  Administered 2012-03-01: 4 mg via INTRAVENOUS

## 2012-03-01 MED ORDER — FENTANYL CITRATE 0.05 MG/ML IJ SOLN
50.0000 ug | INTRAMUSCULAR | Status: DC | PRN
  Administered 2012-03-01: 50 ug via INTRAVENOUS

## 2012-03-01 MED ORDER — ONDANSETRON HCL 4 MG PO TABS: 4.0000 mg | ORAL_TABLET | Freq: Four times a day (QID) | ORAL | Status: DC | PRN

## 2012-03-01 MED ORDER — HYDROMORPHONE HCL PF 1 MG/ML IJ SOLN: 0.2500 mg | INTRAMUSCULAR | Status: DC | PRN

## 2012-03-01 MED ORDER — ENOXAPARIN SODIUM 40 MG/0.4ML ~~LOC~~ SOLN
40.0000 mg | SUBCUTANEOUS | Status: DC
  Administered 2012-03-02 – 2012-03-04 (×3): 40 mg via SUBCUTANEOUS
  Filled 2012-03-01 (×4): qty 0.4

## 2012-03-01 MED ORDER — MORPHINE SULFATE 2 MG/ML IJ SOLN: 1.0000 mg | INTRAMUSCULAR | Status: DC | PRN

## 2012-03-01 MED ORDER — OXYCODONE HCL 5 MG PO TABS
5.0000 mg | ORAL_TABLET | Freq: Once | ORAL | Status: DC | PRN
Start: 2012-03-01 — End: 2012-03-01

## 2012-03-01 MED ORDER — SIMVASTATIN 40 MG PO TABS
40.0000 mg | ORAL_TABLET | Freq: Every evening | ORAL | Status: DC
  Administered 2012-03-01 – 2012-03-03 (×3): 40 mg via ORAL
  Filled 2012-03-01 (×4): qty 1

## 2012-03-01 MED ORDER — DIPHENHYDRAMINE HCL 12.5 MG/5ML PO ELIX: 12.5000 mg | ORAL_SOLUTION | ORAL | Status: DC | PRN

## 2012-03-01 MED ORDER — 0.9 % SODIUM CHLORIDE (POUR BTL) OPTIME
TOPICAL | Status: DC | PRN
  Administered 2012-03-01: 1000 mL

## 2012-03-01 MED ORDER — POTASSIUM CHLORIDE IN NACL 20-0.9 MEQ/L-% IV SOLN
INTRAVENOUS | Status: DC
  Filled 2012-03-01 (×3): qty 1000

## 2012-03-01 MED ORDER — INSULIN ASPART 100 UNIT/ML ~~LOC~~ SOLN
0.0000 [IU] | Freq: Three times a day (TID) | SUBCUTANEOUS | Status: DC
  Administered 2012-03-01 – 2012-03-02 (×2): 3 [IU] via SUBCUTANEOUS
  Filled 2012-03-01: qty 3

## 2012-03-01 MED ORDER — INSULIN ASPART 100 UNIT/ML ~~LOC~~ SOLN
4.0000 [IU] | Freq: Three times a day (TID) | SUBCUTANEOUS | Status: DC
  Administered 2012-03-02: 4 [IU] via SUBCUTANEOUS

## 2012-03-01 MED ORDER — NEOSTIGMINE METHYLSULFATE 1 MG/ML IJ SOLN
INTRAMUSCULAR | Status: DC | PRN
  Administered 2012-03-01: 3 mg via INTRAVENOUS

## 2012-03-01 MED ORDER — BUPIVACAINE-EPINEPHRINE PF 0.5-1:200000 % IJ SOLN
INTRAMUSCULAR | Status: DC | PRN
  Administered 2012-03-01: 30 mL

## 2012-03-01 MED ORDER — LIDOCAINE HCL (CARDIAC) 20 MG/ML IV SOLN
INTRAVENOUS | Status: DC | PRN
  Administered 2012-03-01: 50 mg via INTRAVENOUS

## 2012-03-01 MED ORDER — CEPHALEXIN 500 MG PO CAPS: 500.0000 mg | ORAL_CAPSULE | Freq: Four times a day (QID) | ORAL | Status: DC

## 2012-03-01 MED ORDER — CLONIDINE HCL 0.1 MG PO TABS
0.0500 mg | ORAL_TABLET | Freq: Every day | ORAL | Status: DC
  Administered 2012-03-04: 0.05 mg via ORAL
  Filled 2012-03-01 (×4): qty 0.5

## 2012-03-01 MED ORDER — OXYCODONE HCL 5 MG/5ML PO SOLN: 5.0000 mg | Freq: Once | ORAL | Status: DC | PRN

## 2012-03-01 MED ORDER — INSULIN ASPART 100 UNIT/ML ~~LOC~~ SOLN: 0.0000 [IU] | Freq: Every day | SUBCUTANEOUS | Status: DC

## 2012-03-01 MED ORDER — FENTANYL CITRATE 0.05 MG/ML IJ SOLN
INTRAMUSCULAR | Status: DC | PRN
  Administered 2012-03-01: 50 ug via INTRAVENOUS

## 2012-03-01 MED ORDER — PROPOFOL 10 MG/ML IV BOLUS
INTRAVENOUS | Status: DC | PRN
  Administered 2012-03-01: 20 mg via INTRAVENOUS
  Administered 2012-03-01: 10 mg via INTRAVENOUS
  Administered 2012-03-01: 130 mg via INTRAVENOUS

## 2012-03-01 MED ORDER — MIDAZOLAM HCL 2 MG/2ML IJ SOLN: 1.0000 mg | INTRAMUSCULAR | Status: DC | PRN

## 2012-03-01 MED ORDER — PANTOPRAZOLE SODIUM 40 MG PO TBEC
40.0000 mg | DELAYED_RELEASE_TABLET | Freq: Every day | ORAL | Status: DC
  Administered 2012-03-01 – 2012-03-04 (×4): 40 mg via ORAL
  Filled 2012-03-01 (×4): qty 1

## 2012-03-01 MED ORDER — DOCUSATE SODIUM 100 MG PO CAPS
100.0000 mg | ORAL_CAPSULE | Freq: Two times a day (BID) | ORAL | Status: DC
  Administered 2012-03-01: 100 mg via ORAL
  Filled 2012-03-01 (×2): qty 1

## 2012-03-01 MED ORDER — METOCLOPRAMIDE HCL 5 MG PO TABS
5.0000 mg | ORAL_TABLET | Freq: Three times a day (TID) | ORAL | Status: DC | PRN
  Filled 2012-03-01: qty 2

## 2012-03-01 SURGICAL SUPPLY — 67 items
BANDAGE ELASTIC 4 VELCRO ST LF (GAUZE/BANDAGES/DRESSINGS) ×2 IMPLANT
BANDAGE ELASTIC 6 VELCRO ST LF (GAUZE/BANDAGES/DRESSINGS) ×2 IMPLANT
BANDAGE GAUZE ELAST BULKY 4 IN (GAUZE/BANDAGES/DRESSINGS) ×2 IMPLANT
BIT DRILL CALIBRATED 3.2MM (DRILL) ×1 IMPLANT
BIT DRILL GUIDEWIRE 2.5X200 (WIRE) ×8 IMPLANT
BLADE SURG ROTATE 9660 (MISCELLANEOUS) IMPLANT
BRUSH SCRUB DISP (MISCELLANEOUS) ×4 IMPLANT
CLOTH BEACON ORANGE TIMEOUT ST (SAFETY) ×2 IMPLANT
DRAPE C-ARM 42X72 X-RAY (DRAPES) ×2 IMPLANT
DRAPE C-ARMOR (DRAPES) ×2 IMPLANT
DRAPE ORTHO SPLIT 77X108 STRL (DRAPES) ×4
DRAPE SURG ORHT 6 SPLT 77X108 (DRAPES) ×2 IMPLANT
DRAPE U-SHAPE 47X51 STRL (DRAPES) ×2 IMPLANT
DRILL CALIBRATED 3.2MM (DRILL) ×2
DRSG ADAPTIC 3X8 NADH LF (GAUZE/BANDAGES/DRESSINGS) ×2 IMPLANT
DRSG MEPILEX BORDER 4X8 (GAUZE/BANDAGES/DRESSINGS) ×4 IMPLANT
DRSG PAD ABDOMINAL 8X10 ST (GAUZE/BANDAGES/DRESSINGS) ×4 IMPLANT
ELECT REM PT RETURN 9FT ADLT (ELECTROSURGICAL) ×2
ELECTRODE REM PT RTRN 9FT ADLT (ELECTROSURGICAL) ×1 IMPLANT
EVACUATOR 1/8 PVC DRAIN (DRAIN) IMPLANT
EVACUATOR 3/16  PVC DRAIN (DRAIN)
EVACUATOR 3/16 PVC DRAIN (DRAIN) IMPLANT
GLOVE BIO SURGEON STRL SZ7.5 (GLOVE) ×2 IMPLANT
GLOVE BIO SURGEON STRL SZ8 (GLOVE) ×2 IMPLANT
GLOVE BIOGEL PI IND STRL 7.5 (GLOVE) ×1 IMPLANT
GLOVE BIOGEL PI IND STRL 8 (GLOVE) ×1 IMPLANT
GLOVE BIOGEL PI INDICATOR 7.5 (GLOVE) ×1
GLOVE BIOGEL PI INDICATOR 8 (GLOVE) ×1
GOWN PREVENTION PLUS XLARGE (GOWN DISPOSABLE) ×2 IMPLANT
GOWN STRL NON-REIN LRG LVL3 (GOWN DISPOSABLE) ×4 IMPLANT
KIT BASIN OR (CUSTOM PROCEDURE TRAY) ×2 IMPLANT
KIT ROOM TURNOVER OR (KITS) ×2 IMPLANT
MANIFOLD NEPTUNE II (INSTRUMENTS) ×2 IMPLANT
NEEDLE 22X1 1/2 (OR ONLY) (NEEDLE) IMPLANT
NS IRRIG 1000ML POUR BTL (IV SOLUTION) ×2 IMPLANT
PACK TOTAL JOINT (CUSTOM PROCEDURE TRAY) ×2 IMPLANT
PAD ARMBOARD 7.5X6 YLW CONV (MISCELLANEOUS) ×4 IMPLANT
PAD CAST 4YDX4 CTTN HI CHSV (CAST SUPPLIES) ×1 IMPLANT
PADDING CAST COTTON 4X4 STRL (CAST SUPPLIES) ×1
PADDING CAST COTTON 6X4 STRL (CAST SUPPLIES) ×2 IMPLANT
PLATE CONDYLAR LCP 8H RIGHT (Plate) ×2 IMPLANT
SCREW 7.3 CANN CON PT THD 75 (Screw) ×2 IMPLANT
SCREW CANN 7.3 LK 75 (Screw) ×2 IMPLANT
SCREW CANN LOCK 5.0X30 (Screw) ×2 IMPLANT
SCREW CANN LOCK 5.0X65 (Screw) ×2 IMPLANT
SCREW CANN LOCK 5.0X70 (Screw) ×2 IMPLANT
SCREW CANN LOCK 5.0X75 (Screw) ×2 IMPLANT
SCREW CORTEX ST 4.5X36 (Screw) ×2 IMPLANT
SCREW CORTEX ST 4.5X42 (Screw) ×4 IMPLANT
SCREW LOCKING 38MM (Screw) ×2 IMPLANT
SPONGE GAUZE 4X4 12PLY (GAUZE/BANDAGES/DRESSINGS) ×2 IMPLANT
SPONGE LAP 18X18 X RAY DECT (DISPOSABLE) ×2 IMPLANT
STAPLER VISISTAT 35W (STAPLE) ×2 IMPLANT
SUCTION FRAZIER TIP 10 FR DISP (SUCTIONS) ×2 IMPLANT
SUT ETHILON 3 0 PS 1 (SUTURE) ×2 IMPLANT
SUT PROLENE 0 CT 2 (SUTURE) IMPLANT
SUT VIC AB 0 CT1 27 (SUTURE) ×2
SUT VIC AB 0 CT1 27XBRD ANBCTR (SUTURE) ×1 IMPLANT
SUT VIC AB 1 CT1 27 (SUTURE) ×2
SUT VIC AB 1 CT1 27XBRD ANBCTR (SUTURE) ×1 IMPLANT
SUT VIC AB 2-0 CT1 27 (SUTURE) ×2
SUT VIC AB 2-0 CT1 TAPERPNT 27 (SUTURE) ×1 IMPLANT
SYR 20ML ECCENTRIC (SYRINGE) IMPLANT
TOWEL OR 17X24 6PK STRL BLUE (TOWEL DISPOSABLE) ×2 IMPLANT
TOWEL OR 17X26 10 PK STRL BLUE (TOWEL DISPOSABLE) ×4 IMPLANT
TRAY FOLEY CATH 14FR (SET/KITS/TRAYS/PACK) IMPLANT
WATER STERILE IRR 1000ML POUR (IV SOLUTION) ×4 IMPLANT

## 2012-03-01 NOTE — Anesthesia Postprocedure Evaluation (Signed)
  Anesthesia Post-op Note  Patient: Judith [name]  Procedure(s) Performed: Procedure(s) (LRB) with comments: OPEN REDUCTION INTERNAL FIXATION (ORIF) DISTAL FEMUR FRACTURE (Right) - ORIF right femur  Patient Location: PACU  Anesthesia Type:General  Level of Consciousness: awake  Airway and Oxygen Therapy: Patient Spontanous Breathing  Post-op Pain: mild  Post-op Assessment: Post-op Vital signs reviewed  Post-op Vital Signs: Reviewed  Complications: No apparent anesthesia complications

## 2012-03-01 NOTE — Anesthesia Procedure Notes (Signed)
Anesthesia Regional Block:  Femoral nerve block  Pre-Anesthetic Checklist: ,, timeout performed, Correct Patient, Correct Site, Correct Laterality, Correct Procedure,, site marked, risks and benefits discussed, Surgical consent,  Pre-op evaluation,  At surgeon's request and post-op pain management  Laterality: Right  Prep: chloraprep       Needles:  Injection technique: Single-shot  Needle Type: Echogenic Stimulator Needle     Needle Length: 9cm  Needle Gauge: 21    Additional Needles:  Procedures: nerve stimulator Femoral nerve block  Nerve Stimulator or Paresthesia:  Response: Quadriceps muscle contraction, 0.45 mA,   Additional Responses:   Narrative:  Start time: 03/01/2012 1:21 PM End time: 03/01/2012 1:34 PM Injection made incrementally with aspirations every 5 mL.  Performed by: Personally  Anesthesiologist: Dr Chaney Malling  Additional Notes: Functioning IV was confirmed and monitors were applied.  A 90mm 21ga Arrow echogenic stimulator needle was used. Sterile prep and drape,hand hygiene and sterile gloves were used.  Negative aspiration and negative test dose prior to incremental administration of local anesthetic. The patient tolerated the procedure well.    Femoral nerve block

## 2012-03-01 NOTE — Consult Note (Signed)
I have seen and examined the patient. I agree with the findings above.  R distal femur fracture s/p R BKA  I have discussed with the patient the risks and benefits of surgery, including the possibility of infection, nerve injury, vessel injury, wound breakdown, arthritis, symptomatic hardware, DVT/ PE, loss of motion, and need for further surgery among others.  She understands these risks and wishes to proceed.   Budd Palmer, MD 03/01/2012 1:51 PM

## 2012-03-01 NOTE — Preoperative (Signed)
Beta Blockers   Reason not to administer Beta Blockers:Not Applicable 

## 2012-03-01 NOTE — Consult Note (Signed)
Orthopaedic Trauma Service Consult  Reason for Consult: R distal femur fracture s/p R BKA Referring Physician: Dr. Christain Sacramento    HPI:    72 y/o female with h/o insulin dependent DM, HTN and h/o R BKA 4 years ago sustained a mechanical ground level fall at her home approximately 1 week ago.  Pt states that the walker went out from under her and she fell on her R knee. Pt had immediate onset of pain and went to the ED for evaluation. She was found to have a right distal femur fracture.  She also had a positive u/a which grew out Klebsiella pneumonia and has been treated with keflex since 02/24/2012 (sensitive to cefazolin). Pt followed up with Dr. Jerl Santos in the office and f/u films showed further displacement.  Given the complexity of the injury, Dr. Jerl Santos sought consult from Dr. Carola Frost and the OTS for definitive management.  Pt was admitted from the office to the hospital for operative intervention on 03/01/2012.  CT of the R knee was obtained on admission to the hospital as well.    Currently pt in in room 5N12.  She is comfortable with no significant complaints.  Notes R leg pain. Denies any additional injuries as a result of the fall. Pt was using prosthesis with walker pre-injury.  Pt presents for fixation of R distal femur fracture      Past Medical History  Diagnosis Date  . Hypertension   . Insulin dependent diabetes mellitus 03/01/2012  . 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 Closed right ankle fracture   . Hyperlipidemia     Past Surgical History  Procedure Date  . Below knee leg amputation     Right    No family history on file.  Social History:  reports that she has never smoked. She does not have any smokeless tobacco history on file. She reports that she does not drink alcohol or use illicit drugs. Pt lives in [place] with her son She use a prosthesis to the right leg with her walker   Allergies: No Known Allergies  Medications:  I have reviewed the  patient's current medications. Prior to Admission:  Prescriptions prior to admission  Medication Sig Dispense Refill  . cephALEXin (KEFLEX) 500 MG capsule Take 500 mg by mouth 4 (four) times daily.      . Chlorphen-PE-Acetaminophen (NOREL AD) 4-10-325 MG TABS Take 1 tablet by mouth 2 (two) times daily as needed. For congestion      . cloNIDine (CATAPRES) 0.1 MG tablet Take 0.05-0.1 mg by mouth daily.       Marland Kitchen glipiZIDE (GLUCOTROL XL) 5 MG 24 hr tablet Take 5 mg by mouth daily with breakfast.       . insulin detemir (LEVEMIR) 100 UNIT/ML injection Inject 20 Units into the skin every morning.       Marland Kitchen omeprazole (PRILOSEC) 20 MG capsule Take 20 mg by mouth daily as needed. For acid reflux      . oxyCODONE-acetaminophen (PERCOCET/ROXICET) 5-325 MG per tablet Take 0.5 tablets by mouth every 2 (two) hours as needed. For pain      . simvastatin (ZOCOR) 40 MG tablet Take 40 mg by mouth every evening.      . [DISCONTINUED] cephALEXin (KEFLEX) 500 MG capsule Take 1 capsule (500 mg total) by mouth 4 (four) times daily.  40 capsule  0   Scheduled:   .  ceFAZolin (ANCEF) IV  2 g Intravenous Once  . multivitamin  with minerals  1 tablet Oral Daily    Results for orders placed during the hospital encounter of 02/29/12 (from the past 48 hour(s))  BASIC METABOLIC PANEL     Status: Abnormal   Collection Time   02/29/12  1:25 PM      Component Value Range Comment   Sodium 141  135 - 145 mEq/L    Potassium 3.6  3.5 - 5.1 mEq/L    Chloride 102  96 - 112 mEq/L    CO2 30  19 - 32 mEq/L    Glucose, Bld 135 (*) 70 - 99 mg/dL    BUN 11  6 - 23 mg/dL    Creatinine, Ser 1.61  0.50 - 1.10 mg/dL    Calcium 9.3  8.4 - 09.6 mg/dL    GFR calc non Af Amer 84 (*) >90 mL/min    GFR calc Af Amer >90  >90 mL/min   CBC     Status: Abnormal   Collection Time   02/29/12  1:25 PM      Component Value Range Comment   WBC 6.0  4.0 - 10.5 K/uL    RBC 3.64 (*) 3.87 - 5.11 MIL/uL    Hemoglobin 9.5 (*) 12.0 - 15.0 g/dL    HCT  04.5 (*) 40.9 - 46.0 %    MCV 80.8  78.0 - 100.0 fL    MCH 26.1  26.0 - 34.0 pg    MCHC 32.3  30.0 - 36.0 g/dL    RDW 81.1  91.4 - 78.2 %    Platelets 294  150 - 400 K/uL   PROTIME-INR     Status: Normal   Collection Time   02/29/12  1:25 PM      Component Value Range Comment   Prothrombin Time 13.8  11.6 - 15.2 seconds    INR 1.07  0.00 - 1.49   GLUCOSE, CAPILLARY     Status: Abnormal   Collection Time   02/29/12  1:38 PM      Component Value Range Comment   Glucose-Capillary 118 (*) 70 - 99 mg/dL   URINALYSIS, ROUTINE W REFLEX MICROSCOPIC     Status: Abnormal   Collection Time   02/29/12  3:35 PM      Component Value Range Comment   Color, Urine YELLOW  YELLOW    APPearance CLEAR  CLEAR    Specific Gravity, Urine 1.008  1.005 - 1.030    pH 6.0  5.0 - 8.0    Glucose, UA NEGATIVE  NEGATIVE mg/dL    Hgb urine dipstick SMALL (*) NEGATIVE    Bilirubin Urine NEGATIVE  NEGATIVE    Ketones, ur NEGATIVE  NEGATIVE mg/dL    Protein, ur NEGATIVE  NEGATIVE mg/dL    Urobilinogen, UA 0.2  0.0 - 1.0 mg/dL    Nitrite NEGATIVE  NEGATIVE    Leukocytes, UA LARGE (*) NEGATIVE   URINE MICROSCOPIC-ADD ON     Status: Normal   Collection Time   02/29/12  3:35 PM      Component Value Range Comment   Squamous Epithelial / LPF RARE  RARE    WBC, UA 11-20  <3 WBC/hpf    RBC / HPF 0-2  <3 RBC/hpf    Bacteria, UA RARE  RARE    Urine-Other RARE YEAST     GLUCOSE, CAPILLARY     Status: Abnormal   Collection Time   02/29/12  4:38 PM      Component Value Range Comment  Glucose-Capillary 200 (*) 70 - 99 mg/dL   GLUCOSE, CAPILLARY     Status: Abnormal   Collection Time   02/29/12  9:20 PM      Component Value Range Comment   Glucose-Capillary 174 (*) 70 - 99 mg/dL   CBC     Status: Abnormal   Collection Time   03/01/12  4:30 AM      Component Value Range Comment   WBC 6.9  4.0 - 10.5 K/uL    RBC 3.57 (*) 3.87 - 5.11 MIL/uL    Hemoglobin 9.3 (*) 12.0 - 15.0 g/dL    HCT 16.1 (*) 09.6 - 46.0 %    MCV 80.1   78.0 - 100.0 fL    MCH 26.1  26.0 - 34.0 pg    MCHC 32.5  30.0 - 36.0 g/dL    RDW 04.5  40.9 - 81.1 %    Platelets 293  150 - 400 K/uL   BASIC METABOLIC PANEL     Status: Abnormal   Collection Time   03/01/12  4:30 AM      Component Value Range Comment   Sodium 140  135 - 145 mEq/L    Potassium 3.6  3.5 - 5.1 mEq/L    Chloride 100  96 - 112 mEq/L    CO2 31  19 - 32 mEq/L    Glucose, Bld 160 (*) 70 - 99 mg/dL    BUN 11  6 - 23 mg/dL    Creatinine, Ser 9.14  0.50 - 1.10 mg/dL    Calcium 9.3  8.4 - 78.2 mg/dL    GFR calc non Af Amer 86 (*) >90 mL/min    GFR calc Af Amer >90  >90 mL/min   GLUCOSE, CAPILLARY     Status: Abnormal   Collection Time   03/01/12  7:03 AM      Component Value Range Comment   Glucose-Capillary 185 (*) 70 - 99 mg/dL     Ct Knee Right Wo Contrast  03/01/2012  *RADIOLOGY REPORT*  Clinical Data: Right leg pain after fall last week.  CT OF THE RIGHT KNEE WITHOUT CONTRAST  Technique:  Multidetector CT imaging was performed according to the standard protocol. Multiplanar CT image reconstructions were also generated.  Comparison: Right knee 02/24/2012  Findings: Diffuse bone demineralization.  Postoperative change with right below-the-knee amputation.  Diffuse infiltration in the subcutaneous fat consistent with edema.  Right knee effusion.  There is a comminuted mostly transverse fracture of the distal right femoral metaphysis with mild posterior displacement and angulation and impaction of the fracture fragments.  Sagittal linear fracture lines extend to the patellar femoral joint and to the inner condylar notch. There is no evidence of displacement of the knee joint.  No fractures are demonstrated in the tibia.  IMPRESSION: Comminuted fractures of the distal right femoral metaphysis as described.  Right knee effusion with hemarthrosis.  Diffuse bone demineralization and diffuse soft tissue edema.   Original Report Authenticated By: Burman Nieves, M.D.    Dg Chest Portable 1  View  02/29/2012  *RADIOLOGY REPORT*  Clinical Data: Hypertension.  PORTABLE CHEST - 1 VIEW  Comparison: November 27, 2010.  Findings: Cardiomediastinal silhouette appears normal.  No acute pulmonary disease is noted.  Bony thorax is intact.  IMPRESSION: No acute cardiopulmonary abnormality seen.   Original Report Authenticated By: Lupita Raider.,  M.D.     Review of Systems  Constitutional: Negative for fever and chills.  Respiratory: Negative for shortness of  breath and wheezing.   Cardiovascular: Negative for chest pain.  Gastrointestinal: Negative for nausea, vomiting, abdominal pain, diarrhea and constipation.  Genitourinary: Negative for dysuria.       Foley in place now Seen on 02/24/2012 in ED for UTI, pt started on keflex. cx grew out klebsiella which was sensitive to cefazolin   Musculoskeletal:       Right knee pain  Neurological: Positive for tingling and sensory change. Negative for headaches.       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 rate 18, height 5\' 4"  (1.626 m), weight 88.451 kg (195 lb), SpO2 96.00%. Physical Exam  Constitutional: She is oriented to person, place, and time. She is cooperative. No distress.       Pleasant female  HENT:  Head: Normocephalic and atraumatic.  Mouth/Throat: Mucous membranes are dry.  Eyes: EOM are normal.  Cardiovascular:       s1 and s2  Respiratory:       CTA B   GI:       + BS, soft, NT  Musculoskeletal:       Right Lower Extremity    Well healed R BKA     Stump looks good     + knee effusion     Extremely tender with palpation of R distal femur     Ext is warm     Skin is stable     Skin wrinkles with gentle compression     Thigh compartments soft and NT   Neurological: She is alert and oriented to person, place, and time.  Psychiatric: She has a normal mood and affect. Her speech is normal and behavior is normal. Judgment and thought content  normal. Cognition and memory are normal.    Assessment/Plan:  72 y/o AA female s/p ground level fall with R distal femur fracture  1. Ground level, mechanical fall 2. R intra-articular R distal femur fracture, OTA 33-C1  Given pts level of mobility and use of this leg with a prosthesis as well as the fracture pattern, we feel that the pt would have the best change for full functional recovery with ORIF  Plan for later this pm  Pt will be NWB x 6-8 weeks  PT/OT after surgery  Unrestricted ROM of R knee after surgery  Ice prn   Check vitamin D panel, calcium, prealbumin, albumin 3. DM  SSI while in hospital  Noted previous notes with pts tenuous sugar controls, will monitor 4. HTN  Home meds 5. DVT/PE prophylaxis  Lovenox post op x 14 days 6. FEN  NPO 7. UTI  Present pre-admission  Will continue keflex post op until complete  Repeat u/a looks clean   8. Dispo  OR later today  Mearl Latin, PA-C [facility] [phone] (P) 03/01/2012, 9:25 AM

## 2012-03-01 NOTE — Brief Op Note (Signed)
02/29/2012 - 03/01/2012  4:57 PM  PATIENT:  Judith [name]  72 y.o. female  PRE-OPERATIVE DIAGNOSIS:  right distal femur fracture with intercondylar extension   POST-OPERATIVE DIAGNOSIS:  right distal femur fracture with intercondylar extension   PROCEDURE:  Procedure(s) (LRB) with comments: OPEN REDUCTION INTERNAL FIXATION (ORIF) DISTAL FEMUR FRACTURE (Right) - ORIF right femur with intercondylar extension  SURGEON:  Surgeon(s) and Role:    * Budd Palmer, MD - Primary  PHYSICIAN ASSISTANT: Montez Morita, [facility]  ANESTHESIA:   general  EBL:  Total I/O In: 2000 [I.V.:2000] Out: 1200 [Urine:1150; Other:50]  BLOOD ADMINISTERED:none  DRAINS: none   LOCAL MEDICATIONS USED:  NONE  SPECIMEN:  No Specimen  DISPOSITION OF SPECIMEN:  N/A  COUNTS:  YES  TOURNIQUET:  * No tourniquets in log *  DICTATION: .Other Dictation: Dictation Number [id number]  PLAN OF CARE: Admit to inpatient   PATIENT DISPOSITION:  PACU - hemodynamically stable.   Delay start of Pharmacological VTE agent (>24hrs) due to surgical blood loss or risk of bleeding: no

## 2012-03-01 NOTE — Op Note (Signed)
Judith [name], Judith [name]                  ACCOUNT NO.:  [id number]  MEDICAL RECORD NO.:  [id number]  LOCATION:  5N12C                        FACILITY:  [facility]  PHYSICIAN:  Doralee Albino. Carola Frost, M.D. DATE OF BIRTH:  10/10/1940  DATE OF PROCEDURE:  03/01/2012 DATE OF DISCHARGE:                              OPERATIVE REPORT   PREOPERATIVE DIAGNOSIS:  Right distal femur fracture, supracondylar with intercondylar extension.  POSTOPERATIVE DIAGNOSIS:  Right distal femur fracture, supracondylar with intercondylar extension.  PROCEDURE:  Open reduction and internal fixation of right distal femur with intercondylar extension.  SURGEON:  Doralee Albino. Carola Frost, M.D.  ASSISTANT:  Mearl Latin, [place]  ANESTHESIA:  General.  COMPLICATIONS:  None.  LOCAL:  None.  I/O:  2000 mL crystalloid, urine 1150  ESTIMATED BLOOD LOSS:  50.  DISPOSITION:  To PACU.  CONDITION:  Stable.  BRIEF SUMMARY OF INDICATION FOR PROCEDURE:  Judith [name] is a 72 year old female status post right BKA who is ambulatory with a prosthesis.  She sustained a fall, getting up from her couch resulting in a displaced intercondylar distal femur fracture.  We did discuss with the family the risks and benefits of the surgical repair including the possibility of acceptance of current deformity, the potential for further migration of the fracture, further split of the condyles, which could produce pain and make it difficult for her to ambulate in the future.  She strongly wished to proceed with repair as did the family to protect what ambulatory function she retained.  They understood the risks of surgery to include, symptomatic hardware, malunion, nonunion, need for further surgery, DVT, PE, symptomatic hardware, loss of motion, and multiple others and including anesthetic and perioperative complications such as heart attack and stroke and wished to proceed.  BRIEF DESCRIPTION OF PROCEDURE:  Judith [name] was given preoperative antibiotics,  taken to the operating room where general anesthesia was induced.  Her right lower extremity was prepped and draped in the usual sterile fashion.  A K-wire was placed through the proximal tibia and used for traction, help reduce the fracture.  This was controlled with a tension bow.  A 5-cm incision was made over the condylar segment and then one more proximally.  Fixation was achieved in the articular block using a series of  guide pins and a partially-threaded conical 73 cancellous screws to close down the intercondylar split.  This was then followed by fixation in the shaft, first using a standard cortical 45 fixation and then a locked screw.  This did slightly translate the bone over to the plate and resulted in a little bit of valgus, but overall alignment was well maintained and very functional for her future ambulation.  Fixation was secured in the articular block with locked fixation placed over the guide pins.  Using standard technique with the [employer]ynthe[employer] distal femoral locking plate, wound was irrigated thoroughly and closed in standard layered fashion.  After AP and lateral images confirmed appropriate reduction, hardware placement, trajectory and length.  Judith Morita, PA-C did assist me throughout the procedure, including for traction and production as well as maintenance of the reduction throughout the instrumentation.  We were very careful with her bone  given the expected osteopenia.  The shaft actually had a fairly robust strength to it and same to handle fixation quite well.  He also assisted with wound closure.  PROGNOSIS:  Judith [name] will be nonweightbearing until this femur has had an opportunity to heal over the next 6 weeks.  She will have unrestricted range of motion of the knee and will not be restricted from any formal DVT prophylaxis, which we anticipate to continue for the appropriate duration given the age.  She is at increased risk for perioperative complications  given her medical comorbidities and for infection given her diabetes as well as for nonunion.     Doralee Albino. Carola Frost, M.D.     MHH/MEDQ  D:  03/01/2012  T:  03/01/2012  Job:  [id number]

## 2012-03-01 NOTE — Anesthesia Preprocedure Evaluation (Addendum)
Anesthesia Evaluation  Patient identified by MRN, date of birth, ID band Patient awake    Reviewed: Allergy & Precautions, H&P , NPO status , Patient's Chart, lab work & pertinent test results  Airway Mallampati: II TM Distance: >3 FB Neck ROM: full    Dental  (+) Edentulous Upper and Edentulous Lower   Pulmonary          Cardiovascular hypertension, Pt. on medications     Neuro/Psych    GI/Hepatic   Endo/Other  diabetes, Type 2, Insulin Dependentobese  Renal/GU      Musculoskeletal   Abdominal   Peds  Hematology   Anesthesia Other Findings   Reproductive/Obstetrics                          Anesthesia Physical Anesthesia Plan  ASA: II  Anesthesia Plan: General and Regional   Post-op Pain Management: MAC Combined w/ Regional for Post-op pain   Induction: Intravenous  Airway Management Planned: Oral ETT  Additional Equipment:   Intra-op Plan:   Post-operative Plan: Extubation in OR  Informed Consent: I have reviewed the patients History and Physical, chart, labs and discussed the procedure including the risks, benefits and alternatives for the proposed anesthesia with the patient or authorized representative who has indicated his/her understanding and acceptance.     Plan Discussed with: CRNA, Surgeon and Anesthesiologist  Anesthesia Plan Comments:        Anesthesia Quick Evaluation

## 2012-03-01 NOTE — Transfer of Care (Signed)
Immediate Anesthesia Transfer of Care Note  Patient: Judith [name]  Procedure(s) Performed: Procedure(s) (LRB) with comments: OPEN REDUCTION INTERNAL FIXATION (ORIF) DISTAL FEMUR FRACTURE (Right) - ORIF right femur  Patient Location: PACU  Anesthesia Type:General  Level of Consciousness: awake and alert   Airway & Oxygen Therapy: Patient Spontanous Breathing  Post-op Assessment: Report given to PACU RN and Post -op Vital signs reviewed and stable  Post vital signs: Reviewed and stable  Complications: No apparent anesthesia complications

## 2012-03-02 ENCOUNTER — Encounter (HOSPITAL_COMMUNITY): Payer: Self-pay | Admitting: Orthopedic Surgery

## 2012-03-02 DIAGNOSIS — L98499 Non-pressure chronic ulcer of skin of other sites with unspecified severity: Secondary | ICD-10-CM

## 2012-03-02 DIAGNOSIS — S88119A Complete traumatic amputation at level between knee and ankle, unspecified lower leg, initial encounter: Secondary | ICD-10-CM

## 2012-03-02 DIAGNOSIS — S72453A Displaced supracondylar fracture without intracondylar extension of lower end of unspecified femur, initial encounter for closed fracture: Secondary | ICD-10-CM

## 2012-03-02 DIAGNOSIS — I739 Peripheral vascular disease, unspecified: Secondary | ICD-10-CM

## 2012-03-02 LAB — COMPREHENSIVE METABOLIC PANEL
AST: 14 U/L (ref 0–37)
Albumin: 2.6 g/dL — ABNORMAL LOW (ref 3.5–5.2)
Alkaline Phosphatase: 77 U/L (ref 39–117)
BUN: 19 mg/dL (ref 6–23)
Chloride: 97 mEq/L (ref 96–112)
Creatinine, Ser: 1.01 mg/dL (ref 0.50–1.10)
Potassium: 4.4 mEq/L (ref 3.5–5.1)
Total Bilirubin: 0.4 mg/dL (ref 0.3–1.2)
Total Protein: 6.4 g/dL (ref 6.0–8.3)

## 2012-03-02 LAB — GLUCOSE, CAPILLARY
Glucose-Capillary: 149 mg/dL — ABNORMAL HIGH (ref 70–99)
Glucose-Capillary: 177 mg/dL — ABNORMAL HIGH (ref 70–99)
Glucose-Capillary: 191 mg/dL — ABNORMAL HIGH (ref 70–99)

## 2012-03-02 LAB — HEMOGLOBIN A1C
Hgb A1c MFr Bld: 8 % — ABNORMAL HIGH (ref <5.7)
Mean Plasma Glucose: 183 mg/dL — ABNORMAL HIGH (ref <117)

## 2012-03-02 LAB — CBC
HCT: 27.9 % — ABNORMAL LOW (ref 36.0–46.0)
Hemoglobin: 9 g/dL — ABNORMAL LOW (ref 12.0–15.0)
RBC: 3.45 MIL/uL — ABNORMAL LOW (ref 3.87–5.11)

## 2012-03-02 MED ORDER — ACETAMINOPHEN 325 MG PO TABS
325.0000 mg | ORAL_TABLET | Freq: Four times a day (QID) | ORAL | Status: DC | PRN
  Administered 2012-03-03: 325 mg via ORAL
  Filled 2012-03-02: qty 1

## 2012-03-02 MED ORDER — POLYETHYLENE GLYCOL 3350 17 G PO PACK
17.0000 g | PACK | Freq: Two times a day (BID) | ORAL | Status: DC
  Administered 2012-03-02 – 2012-03-04 (×5): 17 g via ORAL
  Filled 2012-03-02 (×6): qty 1

## 2012-03-02 MED ORDER — BISACODYL 10 MG RE SUPP: 10.0000 mg | Freq: Every day | RECTAL | Status: DC | PRN

## 2012-03-02 MED ORDER — INSULIN ASPART 100 UNIT/ML ~~LOC~~ SOLN
0.0000 [IU] | Freq: Three times a day (TID) | SUBCUTANEOUS | Status: DC
  Administered 2012-03-02: 3 [IU] via SUBCUTANEOUS
  Administered 2012-03-02: 7 [IU] via SUBCUTANEOUS
  Administered 2012-03-03: 4 [IU] via SUBCUTANEOUS
  Administered 2012-03-04: 7 [IU] via SUBCUTANEOUS

## 2012-03-02 MED ORDER — INSULIN ASPART 100 UNIT/ML ~~LOC~~ SOLN: 0.0000 [IU] | Freq: Every day | SUBCUTANEOUS | Status: DC

## 2012-03-02 MED ORDER — HYDROCODONE-ACETAMINOPHEN 5-325 MG PO TABS: 1.0000 | ORAL_TABLET | Freq: Four times a day (QID) | ORAL | Status: DC | PRN

## 2012-03-02 MED ORDER — INSULIN ASPART 100 UNIT/ML ~~LOC~~ SOLN
4.0000 [IU] | Freq: Three times a day (TID) | SUBCUTANEOUS | Status: DC
  Administered 2012-03-03 – 2012-03-04 (×2): 4 [IU] via SUBCUTANEOUS

## 2012-03-02 NOTE — Evaluation (Signed)
Physical Therapy Evaluation Patient Details Name: Judith [name] MRN: [id number] DOB: 02-Mar-1940 Today's Date: 03/02/2012 Time: 4098-1191 PT Time Calculation (min): 28 min  PT Assessment / Plan / Recommendation Clinical Impression  Pt presents with R distal femur fracture s/o ORIF POD 1.  Note that she is also R BKA in 2010 and PTA was ambulating with prosthesis and RW.  She presents today with decreased strength, endurance, ROM and mobility.  Tolerated OOB and pivoted to chair with +2 assist for safety.  Pt will benefit from skilled PT in acute venue to address deficits.  PT recommends CIR vs SNF at D/C to maximize pts safety and function.      PT Assessment  Patient needs continued PT services    Follow Up Recommendations  CIR;SNF;Supervision/Assistance - 24 hour    Does the patient have the potential to tolerate intense rehabilitation      Barriers to Discharge None      Equipment Recommendations  None recommended by PT    Recommendations for Other Services OT consult   Frequency Min 3X/week    Precautions / Restrictions Precautions Precautions: Fall Precaution Comments: R BKA Restrictions Weight Bearing Restrictions: Yes RLE Weight Bearing: Non weight bearing   Pertinent Vitals/Pain No pain during session.       Mobility  Bed Mobility Bed Mobility: Supine to Sit;Sitting - Scoot to Edge of Bed Supine to Sit: 4: Min assist;3: Mod assist Sitting - Scoot to Edge of Bed: 3: Mod assist Details for Bed Mobility Assistance: Assist for R residual limb and for trunk to attain sitting position.  Provided cues for hand placement on bed to self assist trunk and hips to EOB.  she did require some assist via bed pad to scoot to EOB.  Transfers Transfers: Sit to Stand;Stand to Sit;Stand Pivot Transfers Sit to Stand: 1: +2 Total assist;With upper extremity assist;From bed Sit to Stand: Patient Percentage: 50% Stand to Sit: 1: +2 Total assist;With upper extremity assist;With  armrests;To bed;To chair/3-in-1 Stand to Sit: Patient Percentage: 60% Stand Pivot Transfers: 1: +2 Total assist Stand Pivot Transfers: Patient Percentage: 50% Details for Transfer Assistance: Assist to rise, steady and ensure controlled descent with cues for hand placement, technique and LE placement.  PT guarded LLE somewhat to prevent forward translation.  Performed x 3 reps for improved technique and strength.  Pt did very well pivoting on LLE to get to recliner.  Assist to steady throughout with cues for technique/safety.  Ambulation/Gait Ambulation/Gait Assistance: Not tested (comment) Assistive device: Rolling walker    Exercises     PT Diagnosis: Difficulty walking;Generalized weakness;Acute pain  PT Problem List: Decreased strength;Decreased range of motion;Decreased activity tolerance;Decreased balance;Decreased mobility;Decreased knowledge of use of DME;Decreased safety awareness;Obesity PT Treatment Interventions: DME instruction;Functional mobility training;Therapeutic activities;Therapeutic exercise;Balance training;Patient/family education;Wheelchair mobility training   PT Goals Acute Rehab PT Goals PT Goal Formulation: With patient Time For Goal Achievement: 03/16/12 Potential to Achieve Goals: Good Pt will go Supine/Side to Sit: with supervision PT Goal: Supine/Side to Sit - Progress: Goal set today Pt will go Sit to Supine/Side: with supervision PT Goal: Sit to Supine/Side - Progress: Goal set today Pt will go Sit to Stand: with min assist PT Goal: Sit to Stand - Progress: Goal set today Pt will go Stand to Sit: with min assist PT Goal: Stand to Sit - Progress: Goal set today Pt will Transfer Bed to Chair/Chair to Bed: with mod assist PT Transfer Goal: Bed to Chair/Chair to Bed - Progress: Goal  set today Pt will Stand: with supervision;3 - 5 min;with bilateral upper extremity support PT Goal: Stand - Progress: Goal set today Pt will Propel Wheelchair: > 150 feet;with  modified independence PT Goal: Propel Wheelchair - Progress: Goal set today  Visit Information  Last PT Received On: 03/02/12 Assistance Needed: +2    Subjective Data  Subjective: I've had no pain so far.  Patient Stated Goal: to get back to walking.    Prior Functioning  Home Living Lives With: Son Available Help at Discharge: Family;Available 24 hours/day Type of Home: Apartment Home Access: Level entry Home Layout: One level Bathroom Shower/Tub: Naval architect Equipment: Grab bars in shower;Walker - rolling;Bedside commode/3-in-1;Shower chair with back Prior Function Level of Independence: Needs assistance Able to Take Stairs?: No Driving: No Vocation: Retired Comments: Son sometimes helps with bathing Communication Communication: No difficulties    Cognition  Cognition Overall Cognitive Status: Appears within functional limits for tasks assessed/performed Arousal/Alertness: Awake/alert Orientation Level: Appears intact for tasks assessed Behavior During Session: [facility] for tasks performed    Extremity/Trunk Assessment Right Lower Extremity Assessment RLE ROM/Strength/Tone: Deficits RLE ROM/Strength/Tone Deficits: Pt with BKA, however she was able to perform some hip flex and abd to assist with getting to EOB.   RLE Sensation: WFL - Light Touch Left Lower Extremity Assessment LLE ROM/Strength/Tone: WFL for tasks assessed;Deficits LLE ROM/Strength/Tone Deficits: Noted that L foot is ER LLE Sensation: WFL - Light Touch Trunk Assessment Trunk Assessment: Kyphotic   Balance Balance Balance Assessed: Yes Static Sitting Balance Static Sitting - Balance Support: Bilateral upper extremity supported Static Sitting - Level of Assistance: 5: Stand by assistance  End of Session PT - End of Session Equipment Utilized During Treatment: Gait belt Activity Tolerance: Patient limited by fatigue Patient left: in chair;with call bell/phone within reach Nurse  Communication: Mobility status  GP     Vista Deck 03/02/2012, 10:43 AM

## 2012-03-02 NOTE — Progress Notes (Signed)
Rehab Admissions Coordinator Note:  Patient was screened by Trish Mage for appropriateness for an Inpatient Acute Rehab Consult.  Noted PT recommending CIR.  At this time, we are recommending Inpatient Rehab consult.  Lelon Frohlich M 03/02/2012, 10:45 AM  I can be reached at [phone].

## 2012-03-02 NOTE — Progress Notes (Signed)
[facility]opaedic Trauma Service (OTS)  Subjective: 1 Day Post-Op Procedure(s) (LRB): OPEN REDUCTION INTERNAL FIXATION (ORIF) DISTAL FEMUR FRACTURE (Right) Patient reports pain as 1 on 0-10 scale.   Feels well; A&O x 3  Objective: Current Vitals Blood pressure 112/55, pulse 70, temperature 97.6 F (36.4 C), temperature source Oral, resp. rate 18, height 5\' 4"  (1.626 m), weight 195 lb (88.451 kg), SpO2 100.00%. Vital signs in last 24 hours: Temp:  [97.4 F (36.3 C)-99.2 F (37.3 C)] 97.6 F (36.4 C) (02/05 1610) Pulse Rate:  [41-94] 70  (02/05 0633) Resp:  [15-22] 18  (02/05 9604) BP: (96-182)/(39-68) 112/55 mmHg (02/05 0633) SpO2:  [80 %-100 %] 100 % (02/05 0633)  Intake/Output from previous day: 02/04 0701 - 02/05 0700 In: 2100 [I.V.:2100] Out: 1700 [Urine:1650]  LABS  Basename 03/01/12 0430 02/29/12 1325  HGB 9.3* 9.5*    Basename 03/01/12 0430 02/29/12 1325  WBC 6.9 6.0  RBC 3.57* 3.64*  HCT 28.6* 29.4*  PLT 293 294    Basename 03/02/12 0545 03/01/12 0430  NA 137 140  K 4.4 3.6  CL 97 100  CO2 29 31  BUN 19 11  CREATININE 1.01 0.67  GLUCOSE 208* 160*  CALCIUM 9.1 9.3    Basename 02/29/12 1325  LABPT --  INR 1.07   Physical Exam  Alert, comfortable No wheezing RLE drsg clean, dry, ioban protected  No significant swelling  Imaging Dg Femur Right  03/01/2012  *RADIOLOGY REPORT*  Clinical Data: ORIF right femoral fracture  RIGHT FEMUR - 2 VIEW  Comparison: CT right knee of 02/29/2012  Findings: Multiple C-arm spot films show plate and screw fixation of the comminuted distal right femoral fracture.  IMPRESSION:  ORIF of distal right femoral fracture.   Original Report Authenticated By: Dwyane Dee, M.D.    Ct Knee Right Wo Contrast  03/01/2012  *RADIOLOGY REPORT*  Clinical Data: Right leg pain after fall last week.  CT OF THE RIGHT KNEE WITHOUT CONTRAST  Technique:  Multidetector CT imaging was performed according to the standard protocol. Multiplanar CT  image reconstructions were also generated.  Comparison: Right knee 02/24/2012  Findings: Diffuse bone demineralization.  Postoperative change with right below-the-knee amputation.  Diffuse infiltration in the subcutaneous fat consistent with edema.  Right knee effusion.  There is a comminuted mostly transverse fracture of the distal right femoral metaphysis with mild posterior displacement and angulation and impaction of the fracture fragments.  Sagittal linear fracture lines extend to the patellar femoral joint and to the inner condylar notch. There is no evidence of displacement of the knee joint.  No fractures are demonstrated in the tibia.  IMPRESSION: Comminuted fractures of the distal right femoral metaphysis as described.  Right knee effusion with hemarthrosis.  Diffuse bone demineralization and diffuse soft tissue edema.   Original Report Authenticated By: Burman Nieves, M.D.    Dg Chest Portable 1 View  02/29/2012  *RADIOLOGY REPORT*  Clinical Data: Hypertension.  PORTABLE CHEST - 1 VIEW  Comparison: November 27, 2010.  Findings: Cardiomediastinal silhouette appears normal.  No acute pulmonary disease is noted.  Bony thorax is intact.  IMPRESSION: No acute cardiopulmonary abnormality seen.   Original Report Authenticated By: Lupita Raider.,  M.D.    Dg Knee Right Port  03/01/2012  *RADIOLOGY REPORT*  Clinical Data: Post surgery.  Femur fracture.  PORTABLE RIGHT KNEE - 1-2 VIEW  Comparison: 02/24/2012.  Findings: Two portable views of the right distal femur reveals side plate and screws transfixing distal right femur  fracture.  Fracture fragments with slight angulation and separation although improved since the preoperative exam.  Below-the-knee amputation.  Gas within the joint space.  IMPRESSION: Open reduction and internal fixation of the comminuted distal right femur fracture.   Original Report Authenticated By: Lacy Duverney, M.D.    Dg C-arm [phone] Min  03/01/2012  *RADIOLOGY REPORT*  Clinical  Data: ORIF right femoral fracture  DG C-ARM 61-120 MIN  Comparison:  Right knee and femur films of 01/29 2014  Findings: Plate and screw fixation of the comminuted distal right femoral fracture is present.  Multiple C-arm spot films show good alignment.  IMPRESSION: ORIF of comminuted distal right femoral fracture.   Original Report Authenticated By: Dwyane Dee, M.D.     Assessment/Plan: 1 Day Post-Op Procedure(s) (LRB): OPEN REDUCTION INTERNAL FIXATION (ORIF) DISTAL FEMUR FRACTURE (Right)  Doing great D/C planning as son adamant about her returning home PT for xfers today Labs pending SSI adjusted Advance diet as tolerated  Myrene Galas, MD [facility] [phone] (p)  03/02/2012, 8:45 AM

## 2012-03-02 NOTE — Progress Notes (Signed)
Orthopedic Tech Progress Note Patient Details:  Judith [name] 1941/01/02 [id number]  Patient ID: Judith [name], female   DOB: 1940-04-02, 72 y.o.   MRN: [id number] Trapeze bar patient helper  Nikki Dom 03/02/2012, 7:07 PM

## 2012-03-02 NOTE — Consult Note (Signed)
Physical Medicine and Rehabilitation Consult Reason for Consult: Right distal femur fracture Referring Physician: Carola Frost   HPI: Judith [name] is a 72 y.o. right-handed female with history of insulin-dependent diabetes mellitus as well as right below-knee amputation 2010 and uses a prosthesis provided by [employer] as well as rolling walker. Patient is a resident of [facility]. Admitted 02/29/2012 after a recent fall at home without loss of consciousness. She was taken to the emergency room with findings of mildly displaced fracture of the distal femur placed in a splint maintain nonweightbearing status. She presented back to the orthopedic office for followup x-rays repeated showing a displaced right distal femoral fracture. Underwent ORIF 03/01/2012 per Dr. Carola Frost. She remains nonweightbearing x6 weeks. Subcutaneous Lovenox added for DVT prophylaxis. Postoperative anemia 9.3 and monitored. Hemoglobin A1c of 8.0 and remains on insulin therapy. Physical therapy evaluation completed 03/02/2012 with recommendations of physical medicine rehabilitation consult to consider inpatient rehabilitation services   Review of Systems  Cardiovascular: Positive for leg swelling.  Musculoskeletal: Positive for falls.  Neurological: Positive for weakness.  Psychiatric/Behavioral: Positive for memory loss.  All other systems reviewed and are negative.   Past Medical History  Diagnosis Date  . Hypertension   . Insulin dependent diabetes mellitus 03/01/2012  . 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 Closed right ankle fracture   . Hyperlipidemia    Past Surgical History  Procedure Date  . Below knee leg amputation     Right   No family history on file. Social History:  reports that she has never smoked. She does not have any smokeless tobacco history on file. She reports that she does not drink alcohol or use illicit drugs. Allergies: No Known Allergies Medications Prior  to Admission  Medication Sig Dispense Refill  . cephALEXin (KEFLEX) 500 MG capsule Take 500 mg by mouth 4 (four) times daily.      . Chlorphen-PE-Acetaminophen (NOREL AD) 4-10-325 MG TABS Take 1 tablet by mouth 2 (two) times daily as needed. For congestion      . cloNIDine (CATAPRES) 0.1 MG tablet Take 0.05-0.1 mg by mouth daily.       Marland Kitchen glipiZIDE (GLUCOTROL XL) 5 MG 24 hr tablet Take 5 mg by mouth daily with breakfast.       . insulin detemir (LEVEMIR) 100 UNIT/ML injection Inject 20 Units into the skin every morning.       Marland Kitchen omeprazole (PRILOSEC) 20 MG capsule Take 20 mg by mouth daily as needed. For acid reflux      . oxyCODONE-acetaminophen (PERCOCET/ROXICET) 5-325 MG per tablet Take 0.5 tablets by mouth every 2 (two) hours as needed. For pain      . simvastatin (ZOCOR) 40 MG tablet Take 40 mg by mouth every evening.      . [DISCONTINUED] cephALEXin (KEFLEX) 500 MG capsule Take 1 capsule (500 mg total) by mouth 4 (four) times daily.  40 capsule  0    Home: Home Living Lives With: Son Available Help at Discharge: Family;Available 24 hours/day Type of Home: Apartment Home Access: Level entry Home Layout: One level Bathroom Shower/Tub: Naval architect Equipment: Grab bars in shower;Walker - rolling;Bedside commode/3-in-1;Shower chair with back  Functional History: Prior Function Able to Take Stairs?: No Driving: No Vocation: Retired Comments: Son sometimes helps with bathing Functional Status:  Mobility: Bed Mobility Bed Mobility: Supine to Sit;Sitting - Scoot to Edge of Bed Supine to Sit: 4: Min assist;3: Mod assist Sitting - Scoot  to Edge of Bed: 3: Mod assist Transfers Transfers: Sit to Stand;Stand to Sit;Stand Pivot Transfers Sit to Stand: 1: +2 Total assist;With upper extremity assist;From bed Sit to Stand: Patient Percentage: 50% Stand to Sit: 1: +2 Total assist;With upper extremity assist;With armrests;To bed;To chair/3-in-1 Stand to Sit: Patient  Percentage: 60% Stand Pivot Transfers: 1: +2 Total assist Stand Pivot Transfers: Patient Percentage: 50% Ambulation/Gait Ambulation/Gait Assistance: Not tested (comment) Assistive device: Rolling walker    ADL:    Cognition: Cognition Arousal/Alertness: Awake/alert Orientation Level: Oriented X4 Cognition Overall Cognitive Status: Appears within functional limits for tasks assessed/performed Arousal/Alertness: Awake/alert Orientation Level: Appears intact for tasks assessed Behavior During Session: [facility] for tasks performed  Blood pressure 98/60, pulse 89, temperature 97.6 F (36.4 C), temperature source Oral, resp. rate 18, height 5\' 4"  (1.626 m), weight 88.451 kg (195 lb), SpO2 100.00%. Physical Exam  Vitals reviewed. Constitutional: She is oriented to person, place, and time.  HENT:  Head: Normocephalic.  Eyes: EOM are normal.  Neck: Normal range of motion. Neck supple. No thyromegaly present.  Cardiovascular: Normal rate and regular rhythm.   Pulmonary/Chest: Effort normal and breath sounds normal. No respiratory distress. She has no wheezes.  Abdominal: Soft. Bowel sounds are normal. She exhibits distension.  Neurological: She is alert and oriented to person, place, and time.       She does have some memory loss related to past issues and hospital course  Skin:       R BKA well-healed. Surgical site dressed right hip     Results for orders placed during the hospital encounter of 02/29/12 (from the past 24 hour(s))  GLUCOSE, CAPILLARY     Status: Abnormal   Collection Time   03/01/12 12:42 PM      Component Value Range   Glucose-Capillary 197 (*) 70 - 99 mg/dL   Comment 1 Notify RN     Comment 2 Documented in Chart    GLUCOSE, CAPILLARY     Status: Abnormal   Collection Time   03/01/12  5:16 PM      Component Value Range   Glucose-Capillary 190 (*) 70 - 99 mg/dL  TYPE AND SCREEN     Status: Normal   Collection Time   03/01/12  5:28 PM      Component Value Range    ABO/RH(D) B NEG     Antibody Screen NEG     Sample Expiration 03/04/2012    HEMOGLOBIN A1C     Status: Abnormal   Collection Time   03/01/12  7:57 PM      Component Value Range   Hemoglobin A1C 8.0 (*) <5.7 %   Mean Plasma Glucose 183 (*) <117 mg/dL  GLUCOSE, CAPILLARY     Status: Abnormal   Collection Time   03/02/12 12:45 AM      Component Value Range   Glucose-Capillary 191 (*) 70 - 99 mg/dL  COMPREHENSIVE METABOLIC PANEL     Status: Abnormal   Collection Time   03/02/12  5:45 AM      Component Value Range   Sodium 137  135 - 145 mEq/L   Potassium 4.4  3.5 - 5.1 mEq/L   Chloride 97  96 - 112 mEq/L   CO2 29  19 - 32 mEq/L   Glucose, Bld 208 (*) 70 - 99 mg/dL   BUN 19  6 - 23 mg/dL   Creatinine, Ser 1.61  0.50 - 1.10 mg/dL   Calcium 9.1  8.4 - 09.6 mg/dL  Total Protein 6.4  6.0 - 8.3 g/dL   Albumin 2.6 (*) 3.5 - 5.2 g/dL   AST 14  0 - 37 U/L   ALT 7  0 - 35 U/L   Alkaline Phosphatase 77  39 - 117 U/L   Total Bilirubin 0.4  0.3 - 1.2 mg/dL   GFR calc non Af Amer 54 (*) >90 mL/min   GFR calc Af Amer 63 (*) >90 mL/min  GLUCOSE, CAPILLARY     Status: Abnormal   Collection Time   03/02/12  6:48 AM      Component Value Range   Glucose-Capillary 177 (*) 70 - 99 mg/dL  CBC     Status: Abnormal   Collection Time   03/02/12  9:10 AM      Component Value Range   WBC 10.1  4.0 - 10.5 K/uL   RBC 3.45 (*) 3.87 - 5.11 MIL/uL   Hemoglobin 9.0 (*) 12.0 - 15.0 g/dL   HCT 16.1 (*) 09.6 - 04.5 %   MCV 80.9  78.0 - 100.0 fL   MCH 26.1  26.0 - 34.0 pg   MCHC 32.3  30.0 - 36.0 g/dL   RDW 40.9  81.1 - 91.4 %   Platelets 296  150 - 400 K/uL   Dg Femur Right  03/01/2012  *RADIOLOGY REPORT*  Clinical Data: ORIF right femoral fracture  RIGHT FEMUR - 2 VIEW  Comparison: CT right knee of 02/29/2012  Findings: Multiple C-arm spot films show plate and screw fixation of the comminuted distal right femoral fracture.  IMPRESSION:  ORIF of distal right femoral fracture.   Original Report Authenticated By:  Dwyane Dee, M.D.    Ct Knee Right Wo Contrast  03/01/2012  *RADIOLOGY REPORT*  Clinical Data: Right leg pain after fall last week.  CT OF THE RIGHT KNEE WITHOUT CONTRAST  Technique:  Multidetector CT imaging was performed according to the standard protocol. Multiplanar CT image reconstructions were also generated.  Comparison: Right knee 02/24/2012  Findings: Diffuse bone demineralization.  Postoperative change with right below-the-knee amputation.  Diffuse infiltration in the subcutaneous fat consistent with edema.  Right knee effusion.  There is a comminuted mostly transverse fracture of the distal right femoral metaphysis with mild posterior displacement and angulation and impaction of the fracture fragments.  Sagittal linear fracture lines extend to the patellar femoral joint and to the inner condylar notch. There is no evidence of displacement of the knee joint.  No fractures are demonstrated in the tibia.  IMPRESSION: Comminuted fractures of the distal right femoral metaphysis as described.  Right knee effusion with hemarthrosis.  Diffuse bone demineralization and diffuse soft tissue edema.   Original Report Authenticated By: Burman Nieves, M.D.    Dg Chest Portable 1 View  02/29/2012  *RADIOLOGY REPORT*  Clinical Data: Hypertension.  PORTABLE CHEST - 1 VIEW  Comparison: November 27, 2010.  Findings: Cardiomediastinal silhouette appears normal.  No acute pulmonary disease is noted.  Bony thorax is intact.  IMPRESSION: No acute cardiopulmonary abnormality seen.   Original Report Authenticated By: Lupita Raider.,  M.D.    Dg Knee Right Port  03/01/2012  *RADIOLOGY REPORT*  Clinical Data: Post surgery.  Femur fracture.  PORTABLE RIGHT KNEE - 1-2 VIEW  Comparison: 02/24/2012.  Findings: Two portable views of the right distal femur reveals side plate and screws transfixing distal right femur fracture.  Fracture fragments with slight angulation and separation although improved since the preoperative exam.   Below-the-knee amputation.  Gas within  the joint space.  IMPRESSION: Open reduction and internal fixation of the comminuted distal right femur fracture.   Original Report Authenticated By: Lacy Duverney, M.D.    Dg C-arm [phone] Min  03/01/2012  *RADIOLOGY REPORT*  Clinical Data: ORIF right femoral fracture  DG C-ARM 61-120 MIN  Comparison:  Right knee and femur films of 01/29 2014  Findings: Plate and screw fixation of the comminuted distal right femoral fracture is present.  Multiple C-arm spot films show good alignment.  IMPRESSION: ORIF of comminuted distal right femoral fracture.   Original Report Authenticated By: Dwyane Dee, M.D.     Assessment/Plan: Diagnosis: R supracondylar femur fracture 1. Does the need for close, 24 hr/day medical supervision in concert with the patient's rehab needs make it unreasonable for this patient to be served in a less intensive setting? Yes 2. Co-Morbidities requiring supervision/potential complications: R BKA, Severe diabetic neuropathy,HTN 3. Due to bladder management, bowel management, safety, skin/wound care, disease management, medication administration, pain management and patient education, does the patient require 24 hr/day rehab nursing? Yes 4. Does the patient require coordinated care of a physician, rehab nurse, PT (1-2 hrs/day, 5 days/week) and OT (1-2 hrs/day, 5 days/week) to address physical and functional deficits in the context of the above medical diagnosis(es)? Yes Addressing deficits in the following areas: balance, endurance, locomotion, strength, transferring, bowel/bladder control, bathing, dressing, feeding, grooming, toileting and psychosocial support 5. Can the patient actively participate in an intensive therapy program of at least 3 hrs of therapy per day at least 5 days per week? Yes 6. The potential for patient to make measurable gains while on inpatient rehab is good 7. Anticipated functional outcomes upon discharge from inpatient rehab  are Supervision to modified independent mobility with PT, Supervision modified independent ADL with OT, Not applicable with SLP. 8. Estimated rehab length of stay to reach the above functional goals is: 7 to 10 days 9. Does the patient have adequate social supports to accommodate these discharge functional goals? Yes and Potentially 10. Anticipated D/C setting: Home 11. Anticipated post D/C treatments: HH therapy 12. Overall Rehab/Functional Prognosis: good  RECOMMENDATIONS: This patient's condition is appropriate for continued rehabilitative care in the following setting: CIR Patient has agreed to participate in recommended program. Yes Note that insurance prior authorization may be required for reimbursement for recommended care.  Comment:Would recommend admission in one to 2 days, currently postop day #1    03/02/2012

## 2012-03-02 NOTE — Progress Notes (Signed)
Inpatient Diabetes Program Recommendations  AACE/ADA: New Consensus Statement on Inpatient Glycemic Control (2013)  Target Ranges:  Prepandial:   less than 140 mg/dL      Peak postprandial:   less than 180 mg/dL (1-2 hours)      Critically ill patients:  140 - 180 mg/dL    Results for KAYLINN, DEDIC (MRN [id number]) as of 03/02/2012 13:41  Ref. Range 03/01/2012 07:03 03/01/2012 12:42 03/01/2012 17:16  Glucose-Capillary Latest Range: 70-99 mg/dL 147 (H) 829 (H) 562 (H)   Results for HAELEE, BOLEN (MRN [id number]) as of 03/02/2012 13:41  Ref. Range 03/02/2012 06:48 03/02/2012 13:01  Glucose-Capillary Latest Range: 70-99 mg/dL 696 (H) 295 (H)    Inpatient Diabetes Program Recommendations Insulin - Basal: Please consider adding patient's home dose of Levemir- Levemir 20 units daily in the morning.  Note: Will follow. Ambrose Finland RN, MSN, CDE Diabetes Coordinator Inpatient Diabetes Program [phone]

## 2012-03-03 ENCOUNTER — Encounter (HOSPITAL_COMMUNITY): Payer: Self-pay | Admitting: Orthopedic Surgery

## 2012-03-03 DIAGNOSIS — Z89511 Acquired absence of right leg below knee: Secondary | ICD-10-CM

## 2012-03-03 DIAGNOSIS — W19XXXA Unspecified fall, initial encounter: Secondary | ICD-10-CM | POA: Diagnosis present

## 2012-03-03 DIAGNOSIS — E639 Nutritional deficiency, unspecified: Secondary | ICD-10-CM | POA: Diagnosis present

## 2012-03-03 DIAGNOSIS — E669 Obesity, unspecified: Secondary | ICD-10-CM

## 2012-03-03 DIAGNOSIS — D62 Acute posthemorrhagic anemia: Secondary | ICD-10-CM

## 2012-03-03 HISTORY — DX: Obesity, unspecified: E66.9

## 2012-03-03 LAB — CBC
HCT: 25.4 % — ABNORMAL LOW (ref 36.0–46.0)
Hemoglobin: 8.3 g/dL — ABNORMAL LOW (ref 12.0–15.0)
MCH: 26.4 pg (ref 26.0–34.0)
MCHC: 32.7 g/dL (ref 30.0–36.0)
RBC: 3.14 MIL/uL — ABNORMAL LOW (ref 3.87–5.11)

## 2012-03-03 LAB — COMPREHENSIVE METABOLIC PANEL
Albumin: 2.4 g/dL — ABNORMAL LOW (ref 3.5–5.2)
Alkaline Phosphatase: 83 U/L (ref 39–117)
BUN: 14 mg/dL (ref 6–23)
CO2: 27 mEq/L (ref 19–32)
Chloride: 100 mEq/L (ref 96–112)
Glucose, Bld: 205 mg/dL — ABNORMAL HIGH (ref 70–99)
Potassium: 3.9 mEq/L (ref 3.5–5.1)
Total Bilirubin: 0.4 mg/dL (ref 0.3–1.2)

## 2012-03-03 LAB — GLUCOSE, CAPILLARY
Glucose-Capillary: 194 mg/dL — ABNORMAL HIGH (ref 70–99)
Glucose-Capillary: 102 mg/dL — ABNORMAL HIGH (ref 70–99)

## 2012-03-03 MED ORDER — GLUCERNA SHAKE PO LIQD
237.0000 mL | Freq: Every day | ORAL | Status: DC
  Administered 2012-03-03 – 2012-03-04 (×2): 237 mL via ORAL

## 2012-03-03 MED ORDER — PRO-STAT SUGAR FREE PO LIQD
30.0000 mL | Freq: Two times a day (BID) | ORAL | Status: DC
  Administered 2012-03-03 – 2012-03-04 (×2): 30 mL via ORAL
  Filled 2012-03-03 (×3): qty 30

## 2012-03-03 MED ORDER — VITAMIN D3 25 MCG (1000 UNIT) PO TABS
1000.0000 [IU] | ORAL_TABLET | Freq: Every day | ORAL | Status: DC
  Administered 2012-03-03 – 2012-03-04 (×2): 1000 [IU] via ORAL
  Filled 2012-03-03 (×2): qty 1

## 2012-03-03 MED ORDER — INSULIN DETEMIR 100 UNIT/ML ~~LOC~~ SOLN
20.0000 [IU] | Freq: Every day | SUBCUTANEOUS | Status: DC
  Administered 2012-03-03 – 2012-03-04 (×3): 20 [IU] via SUBCUTANEOUS
  Filled 2012-03-03: qty 10

## 2012-03-03 NOTE — Progress Notes (Signed)
Rehab admissions - Evaluated for possible admission.  I spoke with patient.  She lives with her son.  She plans to have her niece come stay with her and assist after discharge.  Patient tells me that she is walking to the bathroom with assistance.  Pending progress and the need for inpatient rehab, may be able to admit to inpatient rehab tomorrow.  Patient would like to see how she does today and then decide if she needs to admit to inpatient rehab tomorrow.  Call me for questions.  #[phone]

## 2012-03-03 NOTE — Progress Notes (Signed)
Orthopaedic Trauma Service (OTS)  Subjective: 2 Days Post-Op Procedure(s) (LRB): OPEN REDUCTION INTERNAL FIXATION (ORIF) DISTAL FEMUR FRACTURE (Right)  Doing well No complaints Pain controlled Ready for CIR  Objective: Current Vitals Blood pressure 138/46, pulse 84, temperature 99 F (37.2 C), temperature source Oral, resp. rate 18, height 5\' 4"  (1.626 m), weight 88.451 kg (195 lb), SpO2 97.00%. Vital signs in last 24 hours: Temp:  [98.4 F (36.9 C)-100.4 F (38 C)] 99 F (37.2 C) (02/06 0500) Pulse Rate:  [84-97] 84  (02/06 0500) Resp:  [18] 18  (02/06 0500) BP: (98-138)/(45-60) 138/46 mmHg (02/06 0500) SpO2:  [94 %-97 %] 97 % (02/06 0500)  Intake/Output from previous day: 02/05 0701 - 02/06 0700 In: 390 [P.O.:390] Out: -  Intake/Output      02/05 0701 - 02/06 0700 02/06 0701 - 02/07 0700   P.O. 390    I.V. (mL/kg)     Total Intake(mL/kg) 390 (4.4)    Urine (mL/kg/hr)     Other     Total Output     Net +390         Urine Occurrence 3 x    Stool Occurrence 1 x       LABS  Basename 03/03/12 0620 03/02/12 0910 03/01/12 0430 02/29/12 1325  HGB 8.3* 9.0* 9.3* 9.5*    Basename 03/03/12 0620 03/02/12 0910  WBC 8.5 10.1  RBC 3.14* 3.45*  HCT 25.4* 27.9*  PLT 294 296    Basename 03/03/12 0620 03/02/12 0545  NA 138 137  K 3.9 4.4  CL 100 97  CO2 27 29  BUN 14 19  CREATININE 0.82 1.01  GLUCOSE 205* 208*  CALCIUM 8.9 9.1    Basename 02/29/12 1325  LABPT --  INR 1.07    CBG (last 3)   Basename 03/03/12 0652 03/02/12 2142 03/02/12 1623  GLUCAP 194* 149* 137*      Physical Exam  AVW:UJWJX and alert, NAD, comfortable Lungs:clear Cardiac:reg, s1 and s2 Abd:+ BS, NT Ext:      Right Lower Extremity  Dressing c/d/i, covered with Ioban  Swelling stable  Stump looks good  Ext warm   Assessment/Plan: 2 Days Post-Op Procedure(s) (LRB): OPEN REDUCTION INTERNAL FIXATION (ORIF) DISTAL FEMUR FRACTURE (Right)  72 y/o female s/p ground level  fall with R distal femur fx, s/p R BKA  1. Ground level, mechanical fall 2. R intra-articular R distal femur fracture, POD 2  NWB x 6-8 weeks  Unrestricted ROM of R stump  PT/OT  Dressing change in 4 days  Ice PRN      Vitamin D levels look ok  Prealbumin and albumin levels are low  Calcium is ok  3. DM  hgbA1c is 8.0   Pt started on resistant SSI yesterday, subtle improvement in sugars  Resume home Levemir 20 units sq every morning  Continue to monitor  Continue with SSI 4. HTN  Home meds 5. DVT/PE prophylaxis  lovenox x 14 days (day 2/14) 6. FEN  CHO modified 7. UTI  Present on admission  Complete abx course, ends on 03/05/2012 8. Acute blood loss anemia  Monitor h/h  Pt asymptomatic right now  Check cbc in am 9. Dispo  CIR once bed available  Pt stable for inpt rehab transfer  Mearl Latin, PA-C [facility] [phone] (P) 03/03/2012, 8:41 AM

## 2012-03-03 NOTE — Discharge Summary (Addendum)
Orthopaedic Trauma Service (OTS)  Patient ID: Judith [name] MRN: [id number] DOB/AGE: [birth year] 72 y.o.  Admit date: 02/29/2012 Discharge date: 03/03/2012  Admission Diagnoses: Right distal femur fracture R BKA Pre-admission UTI HTN Insulin Dependent DM Mechanical fall  Discharge Diagnoses:  Active Problems:  Fracture of femur, distal, right, closed  UTI (lower urinary tract infection)  Hypertension  Insulin dependent diabetes mellitus  Acute blood loss anemia  Hx of right BKA  Fall   Procedures Performed: 03/01/2012  Open reduction and internal fixation of right distal femur  with intercondylar extension   Discharged Condition: good  Hospital Course:   Patient is a 72 year old African significant insulin-dependent diabetes mellitus who is status post a right BKA 4 years ago after she developed multiple ulcers from ankle fracture. She has been doing fairly well per reports and supposedly ambulating with prosthesis and walker when she sustained a ground-level fall on the 02/24/2012. She presented to the emergency department where she was found to have a right distal femur fracture. She followed up with Dr. Jerl Santos at the office and was found to have further displacement. Given this injury the orthopedic trauma service was consult and for definitive management. Patient was admitted on 02/29/2012 as scheduled for surgery the following day. Patient underwent open reduction internal fixation of her right distal femur on 03/01/2012. Patient tolerated the procedure well. No acute perioperative issues were encountered. On postoperative day #1 patient was restarted on sliding scale insulin for her diabetes. We did hold her home oral diabetes medications. She was also started on Lovenox for DVT and PE prophylaxis. On postoperative day #1 patient was doing very well on no pertinent issues. It was brought to our attention by the family that the patient often times is constipated for several  days and then has bouts of explosive bowel movements. The family did ask if we would get her to move her bowels well she was inpatient. Patient had her Foley catheter removed on postoperative day #1 as well. She reports physical therapy and occupational therapy on postoperative day #1 and was doing very well. They did recommend an inpatient rehabilitation consult. This was obtained on postoperative day #1 a patient looked to be an appropriate candidate for inpatient on postoperative day #2 the patient continued to do very well. Pain is well-controlled. No acute issues were noted. Patient was deemed stable for discharge to inpatient rehabilitation on postoperative day #2 pending bed availability. Additional note patient was admitted with a pre-existing a UTI which was found on her recent ED visit for the initial diagnosis of her right distal femur fracture. She was started on Keflex 500 mg every 6 hours x10 days. This was continued during her hospital stay  At the time of discharge patient was tolerating diet. Voiding on room. And did have bowel movement.  Consults: diabetes care coordinator  Significant Diagnostic Studies: labs:   CBC    Component Value Date/Time   WBC 8.5 03/03/2012 0620   RBC 3.14* 03/03/2012 0620   HGB 8.3* 03/03/2012 0620   HCT 25.4* 03/03/2012 0620   PLT 294 03/03/2012 0620   MCV 80.9 03/03/2012 0620   MCH 26.4 03/03/2012 0620   MCHC 32.7 03/03/2012 0620   RDW 15.3 03/03/2012 0620   LYMPHSABS 0.9 02/24/2012 1617   MONOABS 0.5 02/24/2012 1617   EOSABS 0.0 02/24/2012 1617   BASOSABS 0.0 02/24/2012 1617   BMET    Component Value Date/Time   NA 138 03/03/2012 0620  K 3.9 03/03/2012 0620   CL 100 03/03/2012 0620   CO2 27 03/03/2012 0620   GLUCOSE 205* 03/03/2012 0620   BUN 14 03/03/2012 0620   CREATININE 0.82 03/03/2012 0620   CALCIUM 8.9 03/03/2012 0620   GFRNONAA 70* 03/03/2012 0620   GFRAA 81* 03/03/2012 0620    CBG (last 3)   Basename 03/03/12 0652 03/02/12 2142 03/02/12 1623  GLUCAP 194* 149*  137*    Vit D, 25-Hydroxy 40 Calcium 9 Albumin 2.6 Prealbumin 12. Hemoglobin A1C: 8.0   Treatments: IV hydration, antibiotics: Ancef and keflex, analgesia: acetaminophen, Vicodin and Morphine, anticoagulation: LMW heparin, therapies: PT, OT and RN and surgery: as above  Discharge Exam:  Orthopaedic Trauma Service (OTS)  Subjective: 2 Days Post-Op Procedure(s) (LRB): OPEN REDUCTION INTERNAL FIXATION (ORIF) DISTAL FEMUR FRACTURE (Right)  Doing well No complaints Pain controlled Ready for CIR  Objective: Current Vitals Blood pressure 138/46, pulse 84, temperature 99 F (37.2 C), temperature source Oral, resp. rate 18, height 5\' 4"  (1.626 m), weight 88.451 kg (195 lb), SpO2 97.00%. Vital signs in last 24 hours: Temp:  [98.4 F (36.9 C)-100.4 F (38 C)] 99 F (37.2 C) (02/06 0500) Pulse Rate:  [84-97] 84  (02/06 0500) Resp:  [18] 18  (02/06 0500) BP: (98-138)/(45-60) 138/46 mmHg (02/06 0500) SpO2:  [94 %-97 %] 97 % (02/06 0500)  Intake/Output from previous day: 02/05 0701 - 02/06 0700 In: 390 [P.O.:390] Out: -  Intake/Output      02/05 0701 - 02/06 0700 02/06 0701 - 02/07 0700    P.O. 390     I.V. (mL/kg)      Total Intake(mL/kg) 390 (4.4)     Urine (mL/kg/hr)      Other      Total Output      Net +390           Urine Occurrence 3 x     Stool Occurrence 1 x        LABS  Basename  03/03/12 0620  03/02/12 0910  03/01/12 0430  02/29/12 1325   HGB  8.3*  9.0*  9.3*  9.5*     Basename  03/03/12 0620  03/02/12 0910   WBC  8.5  10.1   RBC  3.14*  3.45*   HCT  25.4*  27.9*   PLT  294  296     Basename  03/03/12 0620  03/02/12 0545   NA  138  137   K  3.9  4.4   CL  100  97   CO2  27  29   BUN  14  19   CREATININE  0.82  1.01   GLUCOSE  205*  208*   CALCIUM  8.9  9.1     Basename  02/29/12 1325   LABPT  --   INR  1.07     CBG (last 3)    Basename  03/03/12 0652  03/02/12 2142  03/02/12 1623   GLUCAP  194*  149*  137*    Estimated Body mass  index is 33.47 kg/(m^2) as calculated from the following:   Height as of this encounter: 5\' 4" (1.626 m).   Weight as of this encounter: 195 lb(88.451 kg).  Physical Exam  ZOX:WRUEA and alert, NAD, comfortable Lungs:clear Cardiac:reg, s1 and s2 Abd:+ BS, NT Ext:      Right Lower Extremity             Dressing c/d/i, covered with [place]  Swelling stable             Stump looks good             Ext warm   Assessment/Plan: 2 Days Post-Op Procedure(s) (LRB): OPEN REDUCTION INTERNAL FIXATION (ORIF) DISTAL FEMUR FRACTURE (Right)  72 y/o female s/p ground level fall with R distal femur fx, s/p R BKA  1. Ground level, mechanical fall 2. R intra-articular R distal femur fracture, POD 2             NWB x 6-8 weeks             Unrestricted ROM of R stump             PT/OT             Dressing change in 4 days             Ice PRN                          Vitamin D levels look ok             Prealbumin and albumin levels are low             Calcium is ok  3. DM             hgbA1c is 8.0               Pt started on resistant SSI yesterday, subtle improvement in sugars             Resume home Levemir 20 units sq every morning             Continue to monitor             Continue with SSI 4. HTN             Home meds 5. DVT/PE prophylaxis             lovenox x 14 days (day 2/14) 6. FEN             CHO modified 7. UTI             Present on admission             Complete abx course, ends on 03/05/2012 8. Acute blood loss anemia             Monitor h/h             Pt asymptomatic right now             Check cbc in am 9. Dispo             CIR once bed available             Pt stable for inpt rehab transfer  Mearl Latin, PA-C [facility] [phone] (P) 03/03/2012, 8:41 AM      Disposition: CIR    Medication List     As of 03/03/2012  8:57 AM    STOP taking these medications         cephALEXin 500 MG capsule   Commonly known as: KEFLEX       oxyCODONE-acetaminophen 5-325 MG per tablet   Commonly known as: PERCOCET/ROXICET      ASK your doctor about these medications         cloNIDine 0.1 MG tablet   Commonly known as: CATAPRES   Take 0.05-0.1 mg by mouth daily.  glipiZIDE 5 MG 24 hr tablet   Commonly known as: GLUCOTROL XL   Take 5 mg by mouth daily with breakfast.      insulin detemir 100 UNIT/ML injection   Commonly known as: LEVEMIR   Inject 20 Units into the skin every morning.      NOREL AD 4-10-325 MG Tabs   Generic drug: Chlorphen-PE-Acetaminophen   Take 1 tablet by mouth 2 (two) times daily as needed. For congestion      omeprazole 20 MG capsule   Commonly known as: PRILOSEC   Take 20 mg by mouth daily as needed. For acid reflux      simvastatin 40 MG tablet   Commonly known as: ZOCOR   Take 40 mg by mouth every evening.         See inpatient rehab admission note for meds resumed on transfer  Follow-up Information    Follow up with HANDY,MICHAEL H, MD. Schedule an appointment as soon as possible for a visit in 14 days.   Contact information:   [address] [place] [address] [phone]          Discharge Instructions and Plan:  Patient will be nonweightbearing for 6-8 weeks she will have unrestricted range of motion of her right knee and hip We will continue with a regular dressing changes starting in 4 days. I given her issues with her bowel movements we would recommend a covering the dressing with [place] to ensure that sealed off Patient will continue to work with physical therapy and occupational therapy Patient will remain on Lovenox for total of 14 days postoperatively for and PE prophylaxis If there any questions or concerns regarding instructions inpatient rehabilitation  is encouraged to contact the office at [phone]  Patient will continue with vitamin D and calcium supplementation for her osteoporosis which demonstrated clinically on  intraoperative evaluation. The patient will need a DEXA scan as an outpatient to determine the utility of additional pharmacologic treatment for osteoporosis  There is also some degree of poor nutrition present as evidenced by low albumin and prealbumin. We can have these addressed on patient is in inpatient rehabilitation and will initiate a this was a normal dietetic consult before transfer to the inpatient rehabilitation  Based on the degree of that we observed on pre-and postoperative we are somewhat hard pressed to believe that the patient completely independent with the use of the walker and prosthesis. Regardless we are hopeful that we will be able to restore the patient back to her preinjury function  Signed:  Mearl Latin, PA-C [facility] [phone] (P) 03/03/2012, 8:57 AM       [facility] Discharge addendum   Yesterday pt did receive two doses of insulin detemir 20 units each dose, spaced out by an hour and a half.  We monitored the pt very closely and no sequela have been evident.  Pt continues to remain stable.   Safety zone portal completed on the incident. No additional changes.  Please see d/c summary above.  Exam today is unchanged from yesterday. Pt did have RD consult yesterday and to increase protein intake they added oral supplements between meals.   Mearl Latin, PA-C [facility] [phone] 03/04/2012

## 2012-03-03 NOTE — Progress Notes (Signed)
NUTRITION FOLLOW UP/New Consult  Intervention:   1. Glucerna Shake daily (each shake provides 220 kcal, 10 g protein).  2. 30 ml Prostat BID (200 kcal, 30 g protein) 3. Patient educated on increasing protein intake by eating a good protein source at all meals.   Nutrition Dx:   Increased nutrient needs related to post-op healing as evidences by estimated needs, ongoing  Goal:   Patient will meet >/=90% of estimated nutrition needs, meeting  Monitor:   PO intake of meals/supplements, weight, labs, I/Os  Assessment:   New consult received for assessment of nutrition status and requirements. Initial assessment completed on 2/3. Albumin (2.4) and prealbumin (12.4) low.   Patient eating well, reports 75-100% meal completion. She is aware of the need for adequate protein intake.   Possible discharge to inpatient rehab tomorrow.   Height: Ht Readings from Last 1 Encounters:  02/29/12 5\' 4"  (1.626 m)    Weight Status:   Wt Readings from Last 1 Encounters:  02/29/12 195 lb (88.451 kg)    Re-estimated needs:  Kcal: 1600-1700 kcal Protein: 80-90 g Fluid: 1.8-2 L  Skin: Intact  Diet Order: Carb Control   Intake/Output Summary (Last 24 hours) at 03/03/12 1504 Last data filed at 03/03/12 1230  Gross per 24 hour  Intake    280 ml  Output      0 ml  Net    280 ml    Last BM: 2/6   Labs:   Lab 03/03/12 0620 03/02/12 0545 03/01/12 0430  NA 138 137 140  K 3.9 4.4 3.6  CL 100 97 100  CO2 27 29 31   BUN 14 19 11   CREATININE 0.82 1.01 0.67  CALCIUM 8.9 9.1 9.3  MG -- -- --  PHOS -- -- --  GLUCOSE 205* 208* 160*    CBG (last 3)   Basename 03/03/12 1443 03/03/12 1115 03/03/12 0652  GLUCAP 125* 124* 194*    Scheduled Meds:   . cephALEXin  500 mg Oral QID  . cholecalciferol  1,000 Units Oral Daily  . cloNIDine  0.05 mg Oral Daily  . enoxaparin (LOVENOX) injection  40 mg Subcutaneous Q24H  . insulin aspart  0-20 Units Subcutaneous TID WC  . insulin aspart  0-5  Units Subcutaneous QHS  . insulin aspart  4 Units Subcutaneous TID WC  . insulin detemir  20 Units Subcutaneous Daily  . multivitamin with minerals  1 tablet Oral Daily  . pantoprazole  40 mg Oral Daily  . polyethylene glycol  17 g Oral BID  . simvastatin  40 mg Oral QPM    Continuous Infusions:   . 0.9 % NaCl with KCl 20 mEq / Mariel Craft, RD, LDN Pager #: [phone] After-Hours Pager #: [phone]

## 2012-03-03 NOTE — Evaluation (Signed)
Occupational Therapy Evaluation Patient Details Name: Judith [name] MRN: [id number] DOB: 11-16-1940 Today's Date: 03/03/2012 Time: 4098-1191 OT Time Calculation (min): 21 min  OT Assessment / Plan / Recommendation Clinical Impression  Pt demos decline in function with ADLs, strength, safety, balance and activity tolerance following R distal femur fx with ORIF. Pt would benefit from OT services to restore PLOF to return home safely    OT Assessment  Patient needs continued OT Services    Follow Up Recommendations  CIR    Barriers to Discharge Decreased caregiver support    Equipment Recommendations  None recommended by OT    Recommendations for Other Services    Frequency  Min 2X/week    Precautions / Restrictions Precautions Precautions: Fall Precaution Comments: R BKA Restrictions Weight Bearing Restrictions: Yes RLE Weight Bearing: Non weight bearing   Pertinent Vitals/Pain     ADL  Grooming: Performed;Wash/dry hands;Wash/dry face;Supervision/safety;Set up Where Assessed - Grooming: Unsupported sitting Upper Body Bathing: Performed;Supervision/safety;Set up Where Assessed - Upper Body Bathing: Unsupported sitting Lower Body Bathing: Simulated;Moderate assistance;Minimal assistance Where Assessed - Lower Body Bathing: Supported sitting;Supported standing Upper Body Dressing: Performed;Supervision/safety;Set up Where Assessed - Upper Body Dressing: Unsupported sitting Lower Body Dressing: Performed;Minimal assistance;Other (comment);Simulated;Moderate assistance (using sock aid, reacher) Where Assessed - Lower Body Dressing: Unsupported sitting Toilet Transfer: Maximal assistance;Performed Toilet Transfer Method: Stand pivot Toilet Transfer Equipment: Bedside commode Toileting - Clothing Manipulation and Hygiene: Maximal assistance Where Assessed - Engineer, mining and Hygiene: Standing ADL Comments: Pt provided with education and demo of ADL A/E for use  at home    OT Diagnosis: Generalized weakness  OT Problem List: Decreased strength;Decreased activity tolerance;Decreased knowledge of use of DME or AE;Impaired balance (sitting and/or standing) OT Treatment Interventions: Self-care/ADL training;Therapeutic activities;Therapeutic exercise;Neuromuscular education;DME and/or AE instruction;Patient/family education;Balance training   OT Goals Acute Rehab OT Goals OT Goal Formulation: With patient Time For Goal Achievement: 03/10/12 Potential to Achieve Goals: Good ADL Goals Pt Will Perform Lower Body Bathing: with min assist;Sitting, chair;Sitting, edge of bed;with adaptive equipment ADL Goal: Lower Body Bathing - Progress: Goal set today Pt Will Perform Lower Body Dressing: with min assist;Sitting, chair;Sitting, bed;Sit to stand from chair;Sit to stand from bed ADL Goal: Lower Body Dressing - Progress: Goal set today Pt Will Perform Toileting - Clothing Manipulation: with mod assist;Standing ADL Goal: Toileting - Clothing Manipulation - Progress: Goal set today Pt Will Perform Toileting - Hygiene: with mod assist;Sitting on 3-in-1 or toilet ADL Goal: Toileting - Hygiene - Progress: Goal set today Pt Will Perform Tub/Shower Transfer: Shower transfer;with DME;Grab bars ADL Goal: Tub/Shower Transfer - Progress: Goal set today  Visit Information  Last OT Received On: 03/03/12    Subjective Data  Subjective: " I would like to have some therpay after I leave here " Patient Stated Goal: To return home after rehab   Prior Functioning     Home Living Lives With: Son Available Help at Discharge: Family;Available 24 hours/day Type of Home: Apartment Home Access: Level entry Home Layout: One level Bathroom Shower/Tub: Health visitor: Standard Bathroom Accessibility: Yes How Accessible: Accessible via wheelchair;Accessible via walker Home Adaptive Equipment: Bedside commode/3-in-1;Grab bars in shower;Walker -  rolling;Wheelchair - Architectural technologist with back Prior Function Level of Independence: Needs assistance Needs Assistance: Bathing Bath: Minimal Able to Take Stairs?: No Driving: No Vocation: Retired Comments: Son provides assist with bathng sometimes Communication Communication: No difficulties Dominant Hand: Right         Vision/Perception Vision - History  Baseline Vision: Wears glasses only for reading Patient Visual Report: No change from baseline Vision - Assessment Eye Alignment: Within Functional Limits Perception Perception: Within Functional Limits   Cognition  Cognition Overall Cognitive Status: Appears within functional limits for tasks assessed/performed Arousal/Alertness: Awake/alert Orientation Level: Appears intact for tasks assessed Behavior During Session: [facility] for tasks performed    Extremity/Trunk Assessment Right Upper Extremity Assessment RUE ROM/Strength/Tone: [facility] for tasks assessed Left Upper Extremity Assessment LUE ROM/Strength/Tone: WFL for tasks assessed     Mobility Bed Mobility Bed Mobility: Not assessed Transfers Transfers: Sit to Stand;Stand to Sit Sit to Stand: 2: Max assist;With armrests;With upper extremity assist;From chair/3-in-1 Stand to Sit: To chair/3-in-1;2: Max assist;With armrests;With upper extremity assist     Exercise     Balance Balance Balance Assessed: No   End of Session OT - End of Session Equipment Utilized During Treatment: Gait belt;Other (comment) (RW, BSC, ADL A/E) Activity Tolerance: Patient tolerated treatment well Patient left: in chair;with call bell/phone within reach  GO     Galen Manila 03/03/2012, 1:00 PM

## 2012-03-03 NOTE — Progress Notes (Addendum)
Pt was administered Levemir insulin at 0947 20 units as ordered and due at 0900. Nursing student was unaware it had been administered and administered 20 more units of levemir insulin at 1133 with her instructor. Went into pts room to give cholecalciferol tablet and nursing student explained that she just gave 20 of levemir insulin. Montez Morita, PA for Dr. Carola Frost was called and notified of this incident. Instructed to check pts CBG Q4H, give pt juice, and not give scheduled novolog insulin due at 1200. Pt stable, will continue to monitor.   1115 CBG: 124 1443 CBG: 125 1626 CBG: 119  Safety Portal completed on incidence.

## 2012-03-03 NOTE — PMR Pre-admission (Signed)
PMR Admission Coordinator Pre-Admission Assessment  Patient: Judith [name] is an 72 y.o., female MRN: [id number] DOB: 03-28-1940 Height: 5\' 4"  (162.6 cm) Weight: 88.451 kg (195 lb) (per bedscale)              Insurance Information HMO:     PPO:       PCP:       IPA:       80/20:       OTHER:   PRIMARY: Medicare A/B      Policy#: [id number]      Subscriber: Luana Shu CM Name:        Phone#:       Fax#:   Pre-Cert#:        Employer: Retired Benefits:  Phone #:       Name: Armed forces technical officer. Date: 06/26/04=A 05/26/04=B     Deduct: $1216      Out of Pocket Max: none      Life Max: unlimited CIR: 100%      SNF: 100 days Outpatient: 80%     Co-Pay: 20% Home Health: 100%      Co-Pay: none DME: 80%     Co-Pay: 20% Providers: patient's choice  SECONDARY: Medicaid [place]      Policy#: [id number]      Subscriber: Luana Shu CM Name:        Phone#:       Fax#:   Pre-Cert#:        Employer: Retired Benefits:  Phone #: [phone]     Name: Automated Eff. Date: Eligible 03/03/12     Deduct:        Out of Pocket     Emergency Contact Information Contact Information    Name Relation Home Work Mobile   Dolney,Denise Dtr in law [phone]     Cela, Newcom [phone]       Current Medical History  Patient Admitting Diagnosis: R supracondylar femur fracture   History of Present Illness: A 72 y.o. right-handed female with history of insulin-dependent diabetes mellitus as well as right below-knee amputation 2010 and uses a prosthesis provided by advanced prost[employer] as well as rolling walker. Patient is a resident of [facility]. Admitted 02/29/2012 after a recent fall at home without loss of consciousness. She was taken to the emergency room with findings of mildly displaced fracture of the distal femur placed in a splint maintain nonweightbearing status. She presented back to the orthopedic office for followup x-rays repeated showing a displaced right distal  femoral fracture. Underwent ORIF 03/01/2012 per Dr. Carola Frost. She remains nonweightbearing x6 weeks. Subcutaneous Lovenox added for DVT prophylaxis. Postoperative anemia 9.3 and monitored. Hemoglobin A1c of 8.0 and remains on insulin therapy. Physical therapy evaluation completed 03/02/2012 with recommendations of physical medicine rehabilitation consult to consider inpatient rehabilitation services.  Past Medical History  Past Medical History  Diagnosis Date  . Hypertension   . Insulin dependent diabetes mellitus 03/01/2012  . 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 Closed right ankle fracture   . Hyperlipidemia   . Fracture of femur, distal, right, closed 03/01/2012  . Obesity (BMI 30-39.9) 03/03/2012    Family History  family history is not on file.  Prior Rehab/Hospitalizations: Had HH 1 month ago with [facility].   Current Medications  Current facility-administered medications:acetaminophen (TYLENOL) tablet 325-650 mg, 325-650 mg, Oral, Q6H PRN, Mearl Latin, PA, 325 mg at 03/03/12 1737;  bisacodyl (DULCOLAX) suppository 10 mg, 10 mg, Rectal, Daily  PRN, Mearl Latin, PA;  cephALEXin [facility]) capsule 500 mg, 500 mg, Oral, QID, Mearl Latin, PA, 500 mg at 03/04/12 0865 cholecalciferol (VITAMIN D) tablet 1,000 Units, 1,000 Units, Oral, Daily, Mearl Latin, PA, 1,000 Units at 03/04/12 [phone];  cloNIDine (CATAPRES) tablet 0.05 mg, 0.05 mg, Oral, Daily, Mearl Latin, PA, 0.05 mg at 03/04/12 1000;  diphenhydrAMINE (BENADRYL) 12.5 MG/5ML elixir 12.5-25 mg, 12.5-25 mg, Oral, Q4H PRN, Mearl Latin, PA;  enoxaparin (LOVENOX) injection 40 mg, 40 mg, Subcutaneous, Q24H, Mearl Latin, PA, 40 mg at 03/04/12 9629 feeding supplement (GLUCERNA SHAKE) liquid 237 mL, 237 mL, Oral, Daily, Elyse A Shearer, RD, 237 mL at 03/04/12 1031;  feeding supplement (PRO-STAT SUGAR FREE 64) liquid 30 mL, 30 mL, Oral, BID, Elyse A Shearer, RD, 30 mL at 03/04/12 5284;  HYDROcodone-acetaminophen (NORCO/VICODIN) 5-325 MG per tablet 1-2  tablet, 1-2 tablet, Oral, Q6H PRN, Mearl Latin, PA insulin aspart (novoLOG) injection 0-20 Units, 0-20 Units, Subcutaneous, TID WC, Mearl Latin, PA, 4 Units at 03/03/12 0813;  insulin aspart (novoLOG) injection 0-5 Units, 0-5 Units, Subcutaneous, QHS, Mearl Latin, PA;  insulin aspart (novoLOG) injection 4 Units, 4 Units, Subcutaneous, TID WC, Mearl Latin, PA, 4 Units at 03/03/12 0813 insulin detemir (LEVEMIR) injection 20 Units, 20 Units, Subcutaneous, Daily, Mearl Latin, PA, 20 Units at 03/04/12 1030;  methocarbamol (ROBAXIN) 500 mg in dextrose 5 % 50 mL IVPB, 500 mg, Intravenous, Q6H PRN, Mearl Latin, PA;  methocarbamol (ROBAXIN) tablet 500 mg, 500 mg, Oral, Q6H PRN, Mearl Latin, PA;  metoCLOPramide (REGLAN) injection 5-10 mg, 5-10 mg, Intravenous, Q8H PRN, Mearl Latin, PA metoCLOPramide (REGLAN) tablet 5-10 mg, 5-10 mg, Oral, Q8H PRN, Mearl Latin, PA;  morphine 2 MG/ML injection 1-2 mg, 1-2 mg, Intravenous, Q3H PRN, Mearl Latin, PA;  multivitamin with minerals tablet 1 tablet, 1 tablet, Oral, Daily, Haynes Bast, RD, 1 tablet at 03/04/12 [phone];  ondansetron [facility]) injection 4 mg, 4 mg, Intravenous, Q6H PRN, Mearl Latin, PA;  ondansetron [facility]) tablet 4 mg, 4 mg, Oral, Q6H PRN, Mearl Latin, PA pantoprazole (PROTONIX) EC tablet 40 mg, 40 mg, Oral, Daily, Mearl Latin, PA, 40 mg at 03/04/12 0951;  polyethylene glycol (MIRALAX / GLYCOLAX) packet 17 g, 17 g, Oral, BID, Mearl Latin, PA, 17 g at 03/04/12 4010;  simvastatin (ZOCOR) tablet 40 mg, 40 mg, Oral, QPM, Mearl Latin, PA, 40 mg at 03/03/12 1735  Patients Current Diet: Carb Control  Precautions / Restrictions Precautions Precautions: Fall Precaution Comments: R BKA Restrictions Weight Bearing Restrictions: Yes RLE Weight Bearing: Non weight bearing   Prior Activity Level Limited Community (1-2x/wk): Goes out with her aide about 3 X a week.  Home Assistive Devices / Equipment Home Assistive Devices/Equipment:  Systems developer (specify type) Home Adaptive Equipment: Bedside commode/3-in-1;Grab bars in shower;Walker - rolling;Wheelchair - Architectural technologist with back  Prior Functional Level Prior Function Level of Independence: Needs assistance Needs Assistance: Bathing Bath: Minimal Able to Take Stairs?: No Driving: No Vocation: Retired Comments: Son provides assist with bathng sometimes  Current Functional Level Cognition  Arousal/Alertness: Awake/alert Overall Cognitive Status: Appears within functional limits for tasks assessed/performed Orientation Level: Oriented to person;Oriented to place    Extremity Assessment (includes Sensation/Coordination)  RUE ROM/Strength/Tone: WFL for tasks assessed  RLE ROM/Strength/Tone: Deficits RLE ROM/Strength/Tone Deficits: Pt with BKA, however she was able to perform some hip flex and abd to assist with getting to EOB.  RLE Sensation: WFL - Light Touch    ADLs  Grooming: Performed;Wash/dry hands;Wash/dry face;Supervision/safety;Set up Where Assessed - Grooming: Unsupported sitting Upper Body Bathing: Performed;Supervision/safety;Set up Where Assessed - Upper Body Bathing: Unsupported sitting Lower Body Bathing: Simulated;Moderate assistance;Minimal assistance Where Assessed - Lower Body Bathing: Supported sitting;Supported standing Upper Body Dressing: Performed;Supervision/safety;Set up Where Assessed - Upper Body Dressing: Unsupported sitting Lower Body Dressing: Performed;Minimal assistance;Other (comment);Simulated;Moderate assistance (using sock aid, reacher) Where Assessed - Lower Body Dressing: Unsupported sitting Toilet Transfer: Maximal assistance;Performed Toilet Transfer Method: Stand pivot Toilet Transfer Equipment: Bedside commode Toileting - Clothing Manipulation and Hygiene: Maximal assistance Where Assessed - Engineer, mining and Hygiene: Standing ADL Comments: Pt provided with education and demo of ADL A/E  for use at home    Mobility  Bed Mobility: Not assessed Supine to Sit: 4: Min assist;3: Mod assist Sitting - Scoot to Edge of Bed: 3: Mod assist    Transfers  Transfers: Sit to Stand;Stand to [employer] Transfers Sit to Stand: 2: Max assist;With armrests;With upper extremity assist;From chair/3-in-1 Sit to Stand: Patient Percentage: 50% Stand to Sit: To chair/3-in-1;2: Max assist;With armrests;With upper extremity assist Stand to Sit: Patient Percentage: 60% Stand Pivot Transfers: 1: +2 Total assist Stand Pivot Transfers: Patient Percentage: 50%    Ambulation / Gait / Stairs / Wheelchair Mobility  Ambulation/Gait Ambulation/Gait Assistance: Not tested (comment) Assistive device: Rolling walker    Posture / Balance Static Sitting Balance Static Sitting - Balance Support: Bilateral upper extremity supported Static Sitting - Level of Assistance: 5: Stand by assistance    Special needs/care consideration BiPAP/CPAP No CPM No Continuous Drip IV No Dialysis No        Life Vest No Oxygen No Special Bed No Trach Size No Wound Vac (area) No      Skin No                               Bowel mgmt: Had BM 03/03/12 Bladder mgmt: Voiding in the bathroom Diabetic mgmt Yes     Previous Home Environment Living Arrangements: Children Lives With: Son Available Help at Discharge: Family;Available 24 hours/day Type of Home: Apartment Home Layout: One level Home Access: Level entry Bathroom Shower/Tub: Health visitor: Standard Bathroom Accessibility: Yes How Accessible: Accessible via wheelchair;Accessible via walker Home Care Services: No  Discharge Living Setting Plans for Discharge Living Setting: Patient's home;Lives with (comment);Apartment (Lives with her son.  Has 7 children.) Type of Home at Discharge: Apartment Discharge Home Layout: One level Discharge Home Access: Level entry Do you have any problems obtaining your medications?:  No  Social/Family/Support Systems Patient Roles: Parent Contact Information: Danyiel Crespin - son [phone] Anticipated Caregiver: Plans to have her granddaughter come stay with her after discharge. Anticipated Caregiver's Contact Information: Has an aide from 8 am to 3 pm [through [facility] [phone] ] Ability/Limitations of Caregiver: granddaugter, Kaleen Odea,  can assist.  Son is on disability and not working. Caregiver Availability: 24/7 Discharge Plan Discussed with Primary Caregiver: No [message left on phone for pt's daughter in law about pt's move to CIR] Is Caregiver In Agreement with Plan?: Yes, per pt. Does Caregiver/Family have Issues with Lodging/Transportation while Pt is in Rehab?: No  Goals/Additional Needs Patient/Family Goal for Rehab: PT/OT mod I/S goals, no ST needs Expected length of stay: 7-10 days Cultural Considerations: None Dietary Needs: Carb Mod Med Calorie diabetic diet Equipment Needs: TBD Pt/Family Agrees  to Admission and willing to participate: Yes Program Orientation Provided & Reviewed with Pt/Caregiver Including Roles  & Responsibilities: Yes  Decrease burden of Care through IP rehab admission:   Not applicable  Possible need for SNF placement upon discharge: No.  Likely will go home after rehab stay.  Patient Condition: This patient's condition remains as documented in the Consult dated 03/02/12, in which the Rehabilitation Physician determined and documented that the patient's condition is appropriate for intensive rehabilitative care in an inpatient rehabilitation facility.  Preadmission Screen Completed By:  Brock Ra, 03/04/2012 11:17 AM ______________________________________________________________________   Discussed status with Dr. Riley Kill on 03/04/12 at 11:30am and received telephone approval for admission today.  Admission Coordinator:  Brock Ra, time 11:30am/Date 03/04/12

## 2012-03-03 NOTE — Progress Notes (Addendum)
[facility] follow up note  Contacted by RN at 1140 indicating that pt received 2 doses of insulin detemir 20 units.  The first one was given by the RN after the order was placed. Pt received this at 0947. The second dose was given at 1133 by nursing student.  It does not appear that any hard stops for medication administration came up upon attempting to give second dose and according to the administration note at 1133 pharmacy was contacted again regarding the insulin.       Dicussed case with Brett Fairy, RN in great detail.  I have changed cbg's to q 4 hours to monitor.  Pt currently stable with no immediate sequela present. Onset of this insulin is usually within an hour and duration is 6-23 hours.  Continue to monitor for signs of hypoglycemia.  Will hold on scheduled novolog at 1200, pt can have juice x 2 as well.  CBG at 1115 was 124, will have CGG checked now as well  Mearl Latin, PA-C [facility] [phone] (P) 03/03/2012 12:10 PM

## 2012-03-03 NOTE — Plan of Care (Signed)
Problem: Phase II Progression Outcomes Goal: Discharge plan established Recommend CIR for further therapy needs after acute care d/c     

## 2012-03-04 ENCOUNTER — Inpatient Hospital Stay (HOSPITAL_COMMUNITY)
Admission: RE | Admit: 2012-03-04 | Discharge: 2012-03-23 | DRG: 945 | Disposition: A | Discharge status: Home Health | Payer: Medicare Other | Source: Intra-hospital | Attending: Physical Medicine & Rehabilitation | Admitting: Physical Medicine & Rehabilitation

## 2012-03-04 ENCOUNTER — Encounter (HOSPITAL_COMMUNITY): Payer: Self-pay | Admitting: General Practice

## 2012-03-04 DIAGNOSIS — S88119A Complete traumatic amputation at level between knee and ankle, unspecified lower leg, initial encounter: Secondary | ICD-10-CM

## 2012-03-04 DIAGNOSIS — Z79899 Other long term (current) drug therapy: Secondary | ICD-10-CM

## 2012-03-04 DIAGNOSIS — Z794 Long term (current) use of insulin: Secondary | ICD-10-CM

## 2012-03-04 DIAGNOSIS — W19XXXA Unspecified fall, initial encounter: Secondary | ICD-10-CM

## 2012-03-04 DIAGNOSIS — D62 Acute posthemorrhagic anemia: Secondary | ICD-10-CM | POA: Diagnosis present

## 2012-03-04 DIAGNOSIS — Z89511 Acquired absence of right leg below knee: Secondary | ICD-10-CM

## 2012-03-04 DIAGNOSIS — S72453A Displaced supracondylar fracture without intracondylar extension of lower end of unspecified femur, initial encounter for closed fracture: Secondary | ICD-10-CM

## 2012-03-04 DIAGNOSIS — I10 Essential (primary) hypertension: Secondary | ICD-10-CM | POA: Diagnosis present

## 2012-03-04 DIAGNOSIS — E1149 Type 2 diabetes mellitus with other diabetic neurological complication: Secondary | ICD-10-CM | POA: Diagnosis present

## 2012-03-04 DIAGNOSIS — Z5189 Encounter for other specified aftercare: Principal | ICD-10-CM

## 2012-03-04 DIAGNOSIS — E785 Hyperlipidemia, unspecified: Secondary | ICD-10-CM | POA: Diagnosis present

## 2012-03-04 DIAGNOSIS — S72401A Unspecified fracture of lower end of right femur, initial encounter for closed fracture: Secondary | ICD-10-CM | POA: Diagnosis present

## 2012-03-04 DIAGNOSIS — N390 Urinary tract infection, site not specified: Secondary | ICD-10-CM | POA: Diagnosis present

## 2012-03-04 DIAGNOSIS — B961 Klebsiella pneumoniae [K. pneumoniae] as the cause of diseases classified elsewhere: Secondary | ICD-10-CM | POA: Diagnosis present

## 2012-03-04 DIAGNOSIS — E669 Obesity, unspecified: Secondary | ICD-10-CM | POA: Diagnosis present

## 2012-03-04 DIAGNOSIS — D649 Anemia, unspecified: Secondary | ICD-10-CM | POA: Diagnosis present

## 2012-03-04 DIAGNOSIS — E1142 Type 2 diabetes mellitus with diabetic polyneuropathy: Secondary | ICD-10-CM | POA: Diagnosis present

## 2012-03-04 LAB — CBC
HCT: 26.4 % — ABNORMAL LOW (ref 36.0–46.0)
Hemoglobin: 8.6 g/dL — ABNORMAL LOW (ref 12.0–15.0)
Hemoglobin: 8.5 g/dL — ABNORMAL LOW (ref 12.0–15.0)
MCH: 26.3 pg (ref 26.0–34.0)
MCH: 26 pg (ref 26.0–34.0)
MCHC: 32.6 g/dL (ref 30.0–36.0)
MCHC: 32 g/dL (ref 30.0–36.0)
Platelets: 315 10*3/uL (ref 150–400)
RDW: 14.8 % (ref 11.5–15.5)

## 2012-03-04 LAB — GLUCOSE, CAPILLARY
Glucose-Capillary: 106 mg/dL — ABNORMAL HIGH (ref 70–99)
Glucose-Capillary: 93 mg/dL (ref 70–99)
Glucose-Capillary: 82 mg/dL (ref 70–99)
Glucose-Capillary: 171 mg/dL — ABNORMAL HIGH (ref 70–99)
Glucose-Capillary: 217 mg/dL — ABNORMAL HIGH (ref 70–99)
Glucose-Capillary: 67 mg/dL — ABNORMAL LOW (ref 70–99)
Glucose-Capillary: 99 mg/dL (ref 70–99)

## 2012-03-04 LAB — CREATININE, SERUM
Creatinine, Ser: 0.7 mg/dL (ref 0.50–1.10)
GFR calc non Af Amer: 85 mL/min — ABNORMAL LOW (ref >90)

## 2012-03-04 LAB — VITAMIN D 1,25 DIHYDROXY: Vitamin D2 1, 25 (OH)2: <8 pg/mL

## 2012-03-04 MED ORDER — INSULIN ASPART 100 UNIT/ML ~~LOC~~ SOLN
4.0000 [IU] | Freq: Three times a day (TID) | SUBCUTANEOUS | Status: DC
  Administered 2012-03-04 – 2012-03-07 (×7): 4 [IU] via SUBCUTANEOUS

## 2012-03-04 MED ORDER — CEPHALEXIN 500 MG PO CAPS
500.0000 mg | ORAL_CAPSULE | Freq: Four times a day (QID) | ORAL | Status: AC
  Administered 2012-03-04 – 2012-03-08 (×15): 500 mg via ORAL
  Filled 2012-03-04 (×15): qty 1

## 2012-03-04 MED ORDER — ENOXAPARIN SODIUM 30 MG/0.3ML ~~LOC~~ SOLN: 40.0000 mg | SUBCUTANEOUS | Status: DC

## 2012-03-04 MED ORDER — POLYETHYLENE GLYCOL 3350 17 G PO PACK
17.0000 g | PACK | Freq: Every day | ORAL | Status: DC | PRN
  Filled 2012-03-04: qty 1

## 2012-03-04 MED ORDER — HYDROCODONE-ACETAMINOPHEN 5-325 MG PO TABS: 1.0000 | ORAL_TABLET | Freq: Four times a day (QID) | ORAL | Status: DC | PRN

## 2012-03-04 MED ORDER — ACETAMINOPHEN 325 MG PO TABS: 325.0000 mg | ORAL_TABLET | ORAL | Status: DC | PRN

## 2012-03-04 MED ORDER — SENNA 8.6 MG PO TABS
1.0000 | ORAL_TABLET | Freq: Two times a day (BID) | ORAL | Status: DC
  Administered 2012-03-04 – 2012-03-09 (×8): 8.6 mg via ORAL
  Filled 2012-03-04 (×3): qty 1
  Filled 2012-03-04: qty 2
  Filled 2012-03-04 (×8): qty 1

## 2012-03-04 MED ORDER — PANTOPRAZOLE SODIUM 40 MG PO TBEC
40.0000 mg | DELAYED_RELEASE_TABLET | Freq: Every day | ORAL | Status: DC
  Administered 2012-03-05 – 2012-03-23 (×19): 40 mg via ORAL
  Filled 2012-03-04 (×17): qty 1

## 2012-03-04 MED ORDER — CLONIDINE HCL 0.1 MG PO TABS
0.0500 mg | ORAL_TABLET | Freq: Every day | ORAL | Status: DC
  Administered 2012-03-05 – 2012-03-23 (×19): 0.05 mg via ORAL
  Filled 2012-03-04 (×21): qty 0.5

## 2012-03-04 MED ORDER — ONDANSETRON HCL 4 MG PO TABS: 4.0000 mg | ORAL_TABLET | Freq: Four times a day (QID) | ORAL | Status: DC | PRN

## 2012-03-04 MED ORDER — SIMVASTATIN 40 MG PO TABS
40.0000 mg | ORAL_TABLET | Freq: Every evening | ORAL | Status: DC
  Administered 2012-03-04 – 2012-03-22 (×19): 40 mg via ORAL
  Filled 2012-03-04 (×20): qty 1

## 2012-03-04 MED ORDER — INSULIN ASPART 100 UNIT/ML ~~LOC~~ SOLN
0.0000 [IU] | Freq: Three times a day (TID) | SUBCUTANEOUS | Status: DC
  Administered 2012-03-04 – 2012-03-05 (×2): 7 [IU] via SUBCUTANEOUS
  Administered 2012-03-06 (×2): 3 [IU] via SUBCUTANEOUS
  Administered 2012-03-07: 4 [IU] via SUBCUTANEOUS
  Administered 2012-03-07: 7 [IU] via SUBCUTANEOUS
  Administered 2012-03-08: 4 [IU] via SUBCUTANEOUS
  Administered 2012-03-08: 7 [IU] via SUBCUTANEOUS
  Administered 2012-03-09: 11 [IU] via SUBCUTANEOUS
  Administered 2012-03-09: 3 [IU] via SUBCUTANEOUS
  Administered 2012-03-10: 4 [IU] via SUBCUTANEOUS

## 2012-03-04 MED ORDER — METHOCARBAMOL 500 MG PO TABS: 500.0000 mg | ORAL_TABLET | Freq: Four times a day (QID) | ORAL | Status: DC | PRN

## 2012-03-04 MED ORDER — ONDANSETRON HCL 4 MG/2ML IJ SOLN: 4.0000 mg | Freq: Four times a day (QID) | INTRAMUSCULAR | Status: DC | PRN

## 2012-03-04 MED ORDER — ADULT MULTIVITAMIN W/MINERALS CH
1.0000 | ORAL_TABLET | Freq: Every day | ORAL | Status: DC
  Administered 2012-03-05 – 2012-03-23 (×19): 1 via ORAL
  Filled 2012-03-04 (×21): qty 1

## 2012-03-04 MED ORDER — ENOXAPARIN SODIUM 40 MG/0.4ML ~~LOC~~ SOLN
40.0000 mg | SUBCUTANEOUS | Status: DC
  Administered 2012-03-05 – 2012-03-23 (×19): 40 mg via SUBCUTANEOUS
  Filled 2012-03-04 (×21): qty 0.4

## 2012-03-04 MED ORDER — VITAMIN D3 25 MCG (1000 UNIT) PO TABS
1000.0000 [IU] | ORAL_TABLET | Freq: Every day | ORAL | Status: DC
  Administered 2012-03-05 – 2012-03-23 (×19): 1000 [IU] via ORAL
  Filled 2012-03-04 (×21): qty 1

## 2012-03-04 MED ORDER — GLUCERNA SHAKE PO LIQD
237.0000 mL | Freq: Every day | ORAL | Status: DC
  Administered 2012-03-05 – 2012-03-23 (×18): 237 mL via ORAL

## 2012-03-04 MED ORDER — INSULIN DETEMIR 100 UNIT/ML ~~LOC~~ SOLN
20.0000 [IU] | Freq: Every day | SUBCUTANEOUS | Status: DC
  Administered 2012-03-05 – 2012-03-07 (×3): 20 [IU] via SUBCUTANEOUS
  Filled 2012-03-04: qty 10

## 2012-03-04 NOTE — H&P (Addendum)
Physical Medicine and Rehabilitation Admission H&P      CC: right leg pain and impaired mobility. : HPI: Judith [name] is a 72 y.o. right-handed female with history of insulin-dependent diabetes mellitus as well as right below-knee amputation 2010 and uses a prosthesis provided by [employer] as well as rolling walker. Patient is a resident of [facility]. Admitted 02/29/2012 after a recent fall at home without loss of consciousness. She was taken to the emergency room with findings of mildly displaced fracture of the distal femur placed in a splint maintain nonweightbearing status and discharged home. She presented  to the orthopedic office for followup x-rays repeated showing a displaced right distal femoral fracture. Underwent ORIF 03/01/2012 per Dr. Carola Frost. She remains nonweightbearing x6 weeks. Subcutaneous Lovenox added for DVT prophylaxis. Postoperative anemia 9.3 and monitored. Hemoglobin A1c of 8.0 and remains on insulin therapy. Placed on Keflex for preadmission urine tract infection Klebsiella 03/02/2012. Physical therapy evaluation completed 03/02/2012 with recommendations of physical medicine rehabilitation consult to consider inpatient rehabilitation services. Patient was felt to be a good candidate for inpatient rehabilitation services and was admitted for a comprehensive rehabilitation program today   Review of Systems   Cardiovascular: Positive for leg swelling.   Musculoskeletal: Positive for falls.   Neurological: Positive for weakness.   Psychiatric/Behavioral: Positive for memory loss.   All other systems reviewed and are negative      Past Medical History   Diagnosis  Date   .  Hypertension     .  Insulin dependent diabetes mellitus  03/01/2012   .  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  Closed right ankle fracture     .  Hyperlipidemia     .  Fracture of femur, distal, right, closed  03/01/2012   .  Obesity (BMI 30-39.9)  03/03/2012     Past Surgical History   Procedure  Date   .  Below knee leg amputation         Right   .  Orif femur fracture  03/01/2012       Procedure: OPEN REDUCTION INTERNAL FIXATION (ORIF) DISTAL FEMUR FRACTURE;  Surgeon: Budd Palmer, MD;  Location: [facility] OR;  Service: Orthopedics;  Laterality: Right;  ORIF right femur    No family history on file. Social History: reports that she has never smoked. She does not have any smokeless tobacco history on file. She reports that she does not drink alcohol or use illicit drugs. Allergies: No Known Allergies Medications Prior to Admission   Medication  Sig  Dispense  Refill   .  Chlorphen-PE-Acetaminophen (NOREL AD) 4-10-325 MG TABS  Take 1 tablet by mouth 2 (two) times daily as needed. For congestion         .  cloNIDine (CATAPRES) 0.1 MG tablet  Take 0.05-0.1 mg by mouth daily.          Marland Kitchen  glipiZIDE (GLUCOTROL XL) 5 MG 24 hr tablet  Take 5 mg by mouth daily with breakfast.          .  insulin detemir (LEVEMIR) 100 UNIT/ML injection  Inject 20 Units into the skin every morning.          Marland Kitchen  omeprazole (PRILOSEC) 20 MG capsule  Take 20 mg by mouth daily as needed. For acid reflux         .  simvastatin (ZOCOR) 40 MG tablet  Take 40 mg by mouth every evening.         .  [  DISCONTINUED] cephALEXin (KEFLEX) 500 MG capsule  Take 1 capsule (500 mg total) by mouth 4 (four) times daily.   40 capsule   0   .  [DISCONTINUED] cephALEXin (KEFLEX) 500 MG capsule  Take 500 mg by mouth 4 (four) times daily.         .  [DISCONTINUED] oxyCODONE-acetaminophen (PERCOCET/ROXICET) 5-325 MG per tablet  Take 0.5 tablets by mouth every 2 (two) hours as needed. For pain            Home: Home Living Lives With: Son Available Help at Discharge: Family;Available 24 hours/day Type of Home: Apartment Home Access: Level entry Home Layout: One level Bathroom Shower/Tub: Health visitor: Standard Bathroom Accessibility: Yes How Accessible: Accessible via  wheelchair;Accessible via walker Home Adaptive Equipment: Bedside commode/3-in-1;Grab bars in shower;Walker - rolling;Wheelchair - Architectural technologist with back    Functional History: Prior Function Bath: Minimal Able to Take Stairs?: No Driving: No Vocation: Retired Comments: Son provides assist with bathng sometimes   Functional Status:   Mobility: Bed Mobility Bed Mobility: Not assessed Supine to Sit: 4: Min assist;3: Mod assist Sitting - Scoot to Edge of Bed: 3: Mod assist Transfers Transfers: Sit to Stand;Stand to [employer] Transfers Sit to Stand: 2: Max assist;With armrests;With upper extremity assist;From chair/3-in-1 Sit to Stand: Patient Percentage: 50% Stand to Sit: To chair/3-in-1;2: Max assist;With armrests;With upper extremity assist Stand to Sit: Patient Percentage: 60% Stand Pivot Transfers: 1: +2 Total assist Stand Pivot Transfers: Patient Percentage: 50% Ambulation/Gait Ambulation/Gait Assistance: Not tested (comment) Assistive device: Rolling walker   ADL: ADL Grooming: Performed;Wash/dry hands;Wash/dry face;Supervision/safety;Set up Where Assessed - Grooming: Unsupported sitting Upper Body Bathing: Performed;Supervision/safety;Set up Where Assessed - Upper Body Bathing: Unsupported sitting Lower Body Bathing: Simulated;Moderate assistance;Minimal assistance Where Assessed - Lower Body Bathing: Supported sitting;Supported standing Upper Body Dressing: Performed;Supervision/safety;Set up Where Assessed - Upper Body Dressing: Unsupported sitting Lower Body Dressing: Performed;Minimal assistance;Other (comment);Simulated;Moderate assistance (using sock aid, reacher) Where Assessed - Lower Body Dressing: Unsupported sitting Toilet Transfer: Maximal assistance;Performed Toilet Transfer Method: Stand pivot Toilet Transfer Equipment: Bedside commode ADL Comments: Pt provided with education and demo of ADL A/E for use at  home   Cognition: Cognition Arousal/Alertness: Awake/alert Orientation Level: Oriented to person;Oriented to place Cognition Overall Cognitive Status: Appears within functional limits for tasks assessed/performed Arousal/Alertness: Awake/alert Orientation Level: Appears intact for tasks assessed Behavior During Session: [facility] for tasks performed     Blood pressure 137/53, pulse 78, temperature 98.6 F (37 C), temperature source Oral, resp. rate 18, height 5\' 4"  (1.626 m), weight 88.451 kg (195 lb), SpO2 97.00%. Physical Exam  Vitals reviewed.   Constitutional: She is oriented to person, place, and time.   HENT:  Oral mucosa pink and moist Head: Normocephalic.   Eyes: EOM are normal.   Neck: Normal range of motion. Neck supple. No thyromegaly present.   Cardiovascular: Normal rate and regular rhythm.  No murmurs rubs or gallops Pulmonary/Chest: Effort normal and breath sounds normal. No respiratory distress. She has no wheezes.   Abdominal: Soft. Bowel sounds are normal. She exhibits distension. Non tender Neurological: She is alert and oriented to person, place, and time.  She does have some memory loss related to past issues and hospital course but generally appropriate. Strength 5/5 UE's LLE is 4+. RLE is 2/5 at HF and 3/5 KE. No gross sensory deficits.  Skin:  R BKA well-healed. Surgical site dressed right hip to distal femur. No obvious drainage. Area appropriately tender.  Musc: functional  right knee ROM. Pes planus deformity left foot.      Results for orders placed during the hospital encounter of 02/29/12 (from the past 48 hour(s))   CBC     Status: Abnormal     Collection Time     03/02/12  9:10 AM       Component  Value  Range  Comment     WBC  10.1   4.0 - 10.5 K/uL       RBC  3.45 (*)  3.87 - 5.11 MIL/uL       Hemoglobin  9.0 (*)  12.0 - 15.0 g/dL       HCT  16.1 (*)  09.6 - 46.0 %       MCV  80.9   78.0 - 100.0 fL       MCH  26.1   26.0 - 34.0 pg       MCHC   32.3   30.0 - 36.0 g/dL       RDW  04.5   40.9 - 15.5 %       Platelets  296   150 - 400 K/uL     GLUCOSE, CAPILLARY     Status: Abnormal     Collection Time     03/02/12  1:01 PM       Component  Value  Range  Comment     Glucose-Capillary  210 (*)  70 - 99 mg/dL       Comment 1  Documented in Chart          Comment 2  Notify RN        GLUCOSE, CAPILLARY     Status: Abnormal     Collection Time     03/02/12  4:23 PM       Component  Value  Range  Comment     Glucose-Capillary  137 (*)  70 - 99 mg/dL     GLUCOSE, CAPILLARY     Status: Abnormal     Collection Time     03/02/12  9:42 PM       Component  Value  Range  Comment     Glucose-Capillary  149 (*)  70 - 99 mg/dL     COMPREHENSIVE METABOLIC PANEL     Status: Abnormal     Collection Time     03/03/12  6:20 AM       Component  Value  Range  Comment     Sodium  138   135 - 145 mEq/L       Potassium  3.9   3.5 - 5.1 mEq/L       Chloride  100   96 - 112 mEq/L       CO2  27   19 - 32 mEq/L       Glucose, Bld  205 (*)  70 - 99 mg/dL       BUN  14   6 - 23 mg/dL       Creatinine, Ser  0.82   0.50 - 1.10 mg/dL       Calcium  8.9   8.4 - 10.5 mg/dL       Total Protein  6.3   6.0 - 8.3 g/dL       Albumin  2.4 (*)  3.5 - 5.2 g/dL       AST  16   0 - 37 U/L       ALT  6  0 - 35 U/L       Alkaline Phosphatase  83   39 - 117 U/L       Total Bilirubin  0.4   0.3 - 1.2 mg/dL       GFR calc non Af Amer  70 (*)  >90 mL/min       GFR calc Af Amer  81 (*)  >90 mL/min     CBC     Status: Abnormal     Collection Time     03/03/12  6:20 AM       Component  Value  Range  Comment     WBC  8.5   4.0 - 10.5 K/uL       RBC  3.14 (*)  3.87 - 5.11 MIL/uL       Hemoglobin  8.3 (*)  12.0 - 15.0 g/dL       HCT  16.1 (*)  09.6 - 46.0 %       MCV  80.9   78.0 - 100.0 fL       MCH  26.4   26.0 - 34.0 pg       MCHC  32.7   30.0 - 36.0 g/dL       RDW  04.5   40.9 - 15.5 %       Platelets  294   150 - 400 K/uL     GLUCOSE, CAPILLARY     Status: Abnormal      Collection Time     03/03/12  6:52 AM       Component  Value  Range  Comment     Glucose-Capillary  194 (*)  70 - 99 mg/dL     GLUCOSE, CAPILLARY     Status: Abnormal     Collection Time     03/03/12 11:15 AM       Component  Value  Range  Comment     Glucose-Capillary  124 (*)  70 - 99 mg/dL       Comment 1  Documented in Chart          Comment 2  Notify RN        GLUCOSE, CAPILLARY     Status: Abnormal     Collection Time     03/03/12  2:43 PM       Component  Value  Range  Comment     Glucose-Capillary  125 (*)  70 - 99 mg/dL     GLUCOSE, CAPILLARY     Status: Abnormal     Collection Time     03/03/12  4:26 PM       Component  Value  Range  Comment     Glucose-Capillary  119 (*)  70 - 99 mg/dL       Comment 1  Documented in Chart          Comment 2  Notify RN        GLUCOSE, CAPILLARY     Status: Abnormal     Collection Time     03/03/12  6:46 PM       Component  Value  Range  Comment     Glucose-Capillary  102 (*)  70 - 99 mg/dL     GLUCOSE, CAPILLARY     Status: Abnormal     Collection Time     03/03/12  8:04 PM       Component  Value  Range  Comment  Glucose-Capillary  127 (*)  70 - 99 mg/dL     GLUCOSE, CAPILLARY     Status: Abnormal     Collection Time     03/03/12  9:34 PM       Component  Value  Range  Comment     Glucose-Capillary  127 (*)  70 - 99 mg/dL     GLUCOSE, CAPILLARY     Status: Normal     Collection Time     03/03/12 11:56 PM       Component  Value  Range  Comment     Glucose-Capillary  78   70 - 99 mg/dL     GLUCOSE, CAPILLARY     Status: Abnormal     Collection Time     03/04/12  2:05 AM       Component  Value  Range  Comment     Glucose-Capillary  106 (*)  70 - 99 mg/dL     GLUCOSE, CAPILLARY     Status: Normal     Collection Time     03/04/12  3:41 AM       Component  Value  Range  Comment     Glucose-Capillary  93   70 - 99 mg/dL     CBC     Status: Abnormal     Collection Time     03/04/12  6:08 AM       Component  Value  Range  Comment     WBC  8.7    4.0 - 10.5 K/uL       RBC  3.27 (*)  3.87 - 5.11 MIL/uL       Hemoglobin  8.6 (*)  12.0 - 15.0 g/dL       HCT  16.1 (*)  09.6 - 46.0 %       MCV  80.7   78.0 - 100.0 fL       MCH  26.3   26.0 - 34.0 pg       MCHC  32.6   30.0 - 36.0 g/dL       RDW  04.5   40.9 - 15.5 %       Platelets  323   150 - 400 K/uL     GLUCOSE, CAPILLARY     Status: Normal     Collection Time     03/04/12  6:11 AM       Component  Value  Range  Comment     Glucose-Capillary  82   70 - 99 mg/dL      No results found.   Post Admission Physician Evaluation: 1.Functional deficits secondary  to right supracondylar femur fx, s/p ORIF 03/01/12. 2.Patient is admitted to receive collaborative, interdisciplinary care between the physiatrist, rehab nursing staff, and therapy team. 3.Patient's level of medical complexity and substantial therapy needs in context of that medical necessity cannot be provided at a lesser intensity of care such as a SNF. Patient has experienced substantial functional loss from his/her baseline which was documented above under the "Functional History" and "Functional Status" headings.  Judging by the patient's diagnosis, physical exam, and functional history, the patient has potential for functional progress which will result in measurable gains while on inpatient rehab.  These gains will be of substantial and practical use upon discharge  in facilitating mobility and self-care at the household level. 4.Physiatrist will provide 24 hour management of medical needs as well as oversight of the therapy plan/treatment and  provide guidance as appropriate regarding the interaction of the two. 5. 24 hour rehab nursing will assist with bladder management, bowel management, safety, skin/wound care, disease management, medication administration, pain management and patient education  and help integrate therapy concepts, techniques,education, etc. 6.PT will assess and treat for:  Lower extremity strength, range of  motion, stamina, balance, functional mobility, safety, adaptive techniques and equipment, prosthetic education, pain mgt, ortho precautions.  Goals are: supervision to minimal assist. 7.OT will assess and treat for: ADL's, functional mobility, safety, upper extremity strength, adaptive techniques and equipment, ortho precautions, prosthetic related ed.   Goals are: mod I to min assist. 8.SLP will assess and treat for: n/a.  Goals are: n/a. 9.Case Management and Social Worker will assess and treat for psychological issues and discharge planning. 10.Team conference will be held weekly to assess progress toward goals and to determine barriers to discharge. 11.Patient will receive at least 3 hours of therapy per day at least 5 days per week.  ELOS: 2 to 2.5 weeks      Prognosis:  excellent     Medical Problem List and Plan: 1. Right supracondylar femur fracture status post ORIF 03/01/2012 2. DVT Prophylaxis/Anticoagulation: Subcutaneous Lovenox. Monitor platelet counts any signs of bleeding 3. Pain Management: Hydrocodone and Robaxin as needed. Monitor with increased mobility. 4. Neuropsych: This patient is capable of making decisions on his/her own behalf. 5. Postoperative anemia. Latest hemoglobin 8.6. Followup CBC. Patient is asymptomatic 6. History right below-knee amputation 2010. Patient does have a prosthesis per [facility] 7. Diabetes mellitus with peripheral neuropathy. Hemoglobin A1c 8.0. Levemir 20 units daily and NovoLog 4 units 3 times a day. Check blood sugars a.c. and at bedtime. Titrate regimen as needed. 8. Hypertension. Clonidine 0.05 mg daily. Monitor with increased activity 9. Preadmission Klebsiella urine tract infection. Keflex initiated 03/02/2012. Patient denies dysuria 10. Hyperlipidemia. Zocor   03/04/2012 Ranelle Oyster, MD, [place]

## 2012-03-04 NOTE — Significant Event (Signed)
Hypoglycemic Event  CBG: 64  Treatment: 15 GM carbohydrate snack  Symptoms: None  Follow-up CBG: Time:22:36 CBG Result:99  Possible Reasons for Event: Unknown  Comments/MD notified:Patient resolved after treatment    Janyth Pupa  Remember to initiate Hypoglycemia Order Set & complete

## 2012-03-04 NOTE — Progress Notes (Signed)
Hypoglycemic Event  CBG:67  Treatment: Carb snack, apple juice Symptoms: Asymtomatic  Follow-up CBG: Time:22:15 CBG Result:64  Possible Reasons for Event: Poor diet  Comments/MD notified n/a    Judith [name]  Remember to initiate Hypoglycemia Order Set & complete

## 2012-03-04 NOTE — Plan of Care (Signed)
Overall Plan of Care (IPOC) Patient Details Name: Judith [name] MRN: [id number] DOB: [birth year]  Diagnosis:  Rehab for R supracondylar femur fracture  Co-morbidities: Severe diabetic neuropathy, Old R BKA  Functional Problem List  Patient demonstrates impairments in the following areas: Balance, Edema, Endurance, Medication Management, Motor, Pain and Skin Integrity  Basic ADL's: grooming, bathing, dressing, toileting and toilet transfers Advanced ADL's: none  Transfers:  bed mobility, bed to chair, toilet, tub/shower and car Locomotion:  wheelchair mobility  Additional Impairments:  Functional use of upper extremity  Anticipated Outcomes Item Anticipated Outcome  Eating/Swallowing    Basic self-care      MINIMAL ASSIST  Tolieting       MINIMAL ASSIST  Bowel/Bladder  I bladder and mod I with bowel  Transfers  Min-Assist  Locomotion  Manual w/c x 150' min-Assist home and community  Communication    Cognition    Pain    Safety/Judgment    Other     Therapy Plan: PT Intensity: Minimum of 1-2 x/day ,45 to 90 minutes PT Frequency: Total of 15 hours over 7 days Frequency due to: decreased endurance, mobility, balance, strength, and safety. PT Duration Estimated Length of Stay: 10-14 days  OT Intensity: Minimum of 1-2 x/day, 45 to 90 minutes OT Frequency: 5 out of 7 days OT Duration/Estimated Length of Stay: 12-14 days      Team Interventions: Item RN PT OT SLP SW TR Other  Self Care/Advanced ADL Retraining   X      Neuromuscular Re-Education         Therapeutic Activities  x X      UE/LE Strength Training/ROM  x X      UE/LE Coordination Activities         Visual/Perceptual Remediation/Compensation         DME/Adaptive Equipment Instruction  x X      Therapeutic Exercise  x X      Balance/Vestibular Training  x X      Patient/Family Education x x X      Cognitive Remediation/Compensation  x X      Functional Mobility Training  x X      Ambulation/Gait  Programmer, applications Propulsion/Positioning  x X      Surveyor, mining Facilitation         Bladder Management x        Bowel Management x        Disease Management/Prevention   X      Pain Management         Medication Management x        Skin Care/Wound Management x        Splinting/Orthotics         Discharge Planning         Psychosocial Support   X                             Team Discharge Planning: Destination: PT-Home (2 neices 2-bedroom apartment initially) ,OT-   , SLP-  Projected Follow-up: PT-Home health PT, OT- HOME HEALTH    , SLP-  Projected Equipment Needs: PT- , OT-NONE PROJECTED  ,  SLP-  Patient/family involved in discharge planning: PT- Patient,  OT-Patient, SLP-   MD ELOS: 2 weeks Medical Rehab Prognosis:  Good Assessment: 72 yo female with R BKA fell and sustained supra condylar fracture on R.  Requires 24/7 rehab RN/MD, CIR level PT/OT/MSW,.  Team working on ADL, Mobility, safety, balance WC skills.  Goals of Mod I mobility WC level, NWB x 6 weeks    See Team Conference Notes for weekly updates to the plan of care

## 2012-03-04 NOTE — Progress Notes (Signed)
CARE MANAGEMENT NOTE 03/04/2012  Patient:  Judith [name], Judith [name]   Account Number:  [id number]  Date Initiated:  03/04/2012  Documentation initiated by:  Vance Peper  Subjective/Objective Assessment:   72 yr old female s/p right femur ORIF     Action/Plan:   Patient to go to CIR   Anticipated DC Date:  03/04/2012   Anticipated DC Plan:  IP REHAB FACILITY      DC Planning Services  CM consult      PAC Choice  IP REHAB   Choice offered to / List presented to:  C-1 Patient           Status of service:  Completed, signed off Medicare Important Message given?   (If response is "NO", the following Medicare IM given date fields will be blank) Date Medicare IM given:   Date Additional Medicare IM given:    Discharge Disposition:  IP REHAB FACILITY  Per UR Regulation:    If discussed at Long Length of Stay Meetings, dates discussed:    Comments:

## 2012-03-04 NOTE — Progress Notes (Signed)
Pt transferred to 4000 rehab. Report called to RN receiving pt. Pt left unit in a stable condition via bed.

## 2012-03-04 NOTE — Progress Notes (Signed)
[facility] (OTS)  Subjective: 3 Days Post-Op Procedure(s) (LRB): OPEN REDUCTION INTERNAL FIXATION (ORIF) DISTAL FEMUR FRACTURE (Right)   Doing great  No issues No apparent issues related to second dose of detemir given yesterday Feels ready to go to inpatient rehab  Objective: Current Vitals Blood pressure 137/53, pulse 78, temperature 98.6 F (37 C), temperature source Oral, resp. rate 18, height 5\' 4"  (1.626 m), weight 88.451 kg (195 lb), SpO2 97.00%. Vital signs in last 24 hours: Temp:  [98.6 F (37 C)-99.7 F (37.6 C)] 98.6 F (37 C) (02/07 0500) Pulse Rate:  [78-87] 78  (02/07 0500) Resp:  [17-18] 18  (02/07 0500) BP: (102-150)/(53-58) 137/53 mmHg (02/07 0500) SpO2:  [97 %-98 %] 97 % (02/07 0500)  Intake/Output from previous day: 02/06 0701 - 02/07 0700 In: 920 [P.O.:280; I.V.:640] Out: -   LABS  Basename 03/04/12 0608 03/03/12 0620 03/02/12 0910  HGB 8.6* 8.3* 9.0*    Basename 03/04/12 0608 03/03/12 0620  WBC 8.7 8.5  RBC 3.27* 3.14*  HCT 26.4* 25.4*  PLT 323 294    Basename 03/03/12 0620 03/02/12 0545  NA 138 137  K 3.9 4.4  CL 100 97  CO2 27 29  BUN 14 19  CREATININE 0.82 1.01  GLUCOSE 205* 208*  CALCIUM 8.9 9.1   No results found for this basename: LABPT:2,INR:2 in the last 72 hours   Physical Exam  Gen: awake and alert, NAD, just finished breakfast Lungs:clear Cardiac:reg, s1 and s2 Abd: + BS, NT Ext:  Right Lower Extremity             Dressing c/d/i, covered with [place]             Swelling stable             Stump looks good             Ext warm    Imaging No results found.  Assessment/Plan: 3 Days Post-Op Procedure(s) (LRB): OPEN REDUCTION INTERNAL FIXATION (ORIF) DISTAL FEMUR FRACTURE (Right)  72 y/o female s/p ground level fall with R distal femur fx, s/p R BKA  1. Ground level, mechanical fall 2. R intra-articular R distal femur fracture, POD 3             NWB x 6-8 weeks             Unrestricted ROM  of R stump             PT/OT             Dressing change in 4 days             Ice PRN                          Vitamin D levels look ok             Prealbumin and albumin levels are low             Calcium is ok   Appreciate RD consult  3. DM             hgbA1c is 8.0               Resume home Levemir 20 units sq every morning             Continue to monitor             Continue with SSI  Change CBG back to  q 6 hours, before meals and bedtime  4. HTN             Home meds 5. DVT/PE prophylaxis             lovenox x 14 days (day 3/14) 6. FEN             CHO modified  D/c IV 7. UTI             Present on admission             Complete abx course, ends tomorrow  8. Acute blood loss anemia             Monitor h/h, improved from yesterday             Pt asymptomatic right now             Check cbc in am 9. Dispo             CIR once bed available             Pt stable for inpt rehab transfer   Mearl Latin, PA-C [facility] [phone] (P) 03/04/2012, 8:38 AM

## 2012-03-05 ENCOUNTER — Inpatient Hospital Stay (HOSPITAL_COMMUNITY): Payer: Medicare Other | Admitting: Physical Therapy

## 2012-03-05 ENCOUNTER — Inpatient Hospital Stay (HOSPITAL_COMMUNITY): Payer: Medicare Other | Admitting: *Deleted

## 2012-03-05 DIAGNOSIS — S88119A Complete traumatic amputation at level between knee and ankle, unspecified lower leg, initial encounter: Secondary | ICD-10-CM

## 2012-03-05 DIAGNOSIS — S72453A Displaced supracondylar fracture without intracondylar extension of lower end of unspecified femur, initial encounter for closed fracture: Secondary | ICD-10-CM

## 2012-03-05 DIAGNOSIS — L98499 Non-pressure chronic ulcer of skin of other sites with unspecified severity: Secondary | ICD-10-CM

## 2012-03-05 LAB — GLUCOSE, CAPILLARY
Glucose-Capillary: 80 mg/dL (ref 70–99)
Glucose-Capillary: 82 mg/dL (ref 70–99)

## 2012-03-05 NOTE — Evaluation (Signed)
Occupational Therapy Assessment and Plan  Patient Details  Name: Judith [name] MRN: [id number] Date of Birth: 1940/03/19  OT Diagnosis: muscle weakness (generalized) Rehab Potential: Rehab Potential: Good ELOS: 12-14 days   Today's Date: 03/05/2012 Time:  - 1100-1200  (60 min)    Problem List:  Patient Active Problem List  Diagnosis  . Fracture of femur, distal, right, closed  . UTI (lower urinary tract infection)  . Hypertension  . Insulin dependent diabetes mellitus  . Acute blood loss anemia  . Hx of right BKA  . Fall  . Obesity (BMI 30-39.9)  . Poor nutrition    Past Medical History:  Past Medical History  Diagnosis Date  . Hypertension   . Insulin dependent diabetes mellitus 03/01/2012  . 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 Closed right ankle fracture   . Hyperlipidemia   . Fracture of femur, distal, right, closed 03/01/2012  . Obesity (BMI 30-39.9) 03/03/2012   Past Surgical History:  Past Surgical History  Procedure Laterality Date  . Below knee leg amputation      Right  . Orif femur fracture  03/01/2012    Procedure: OPEN REDUCTION INTERNAL FIXATION (ORIF) DISTAL FEMUR FRACTURE;  Surgeon: Budd Palmer, MD;  Location: [facility] OR;  Service: Orthopedics;  Laterality: Right;  ORIF right femur  . Orif femur fracture  03/03/2012    Dr Carola Frost   right leg    Assessment & Plan Clinical Impression: HPI: Judith [name] is a 72 y.o. right-handed female with history of insulin-dependent diabetes mellitus as well as right below-knee amputation 2010 and uses a prosthesis provided by advanced prost[employer] as well as rolling walker. Patient is a resident of [facility]. Admitted 02/29/2012 after a recent fall at home without loss of consciousness. She was taken to the emergency room with findings of mildly displaced fracture of the distal femur placed in a splint maintain nonweightbearing status and discharged home. She presented to the orthopedic office for  followup x-rays repeated showing a displaced right distal femoral fracture. Underwent ORIF 03/01/2012 per Dr. Carola Frost. She remains nonweightbearing x6 weeks. Subcutaneous Lovenox added for DVT prophylaxis. Postoperative anemia 9.3 and monitored. Hemoglobin A1c of 8.0 and remains on insulin therapy. Placed on Keflex for preadmission urine tract infection Klebsiella 03/02/2012 .  Patient transferred to CIR on 03/04/2012 .    Patient currently requires max with basic self-care skills secondary to muscle weakness.  Prior to hospitalization, patient could complete BADL with min.  Patient will benefit from skilled intervention to decrease level of assist with basic self-care skills prior to discharge home with care partner.  Anticipate patient will require intermittent supervision and follow up home health.  OT - End of Session Activity Tolerance: Tolerates < 10 min activity, no significant change in vital signs Endurance Deficit: Yes Endurance Deficit Description: brief rest breaks between tasks  OT Assessment Rehab Potential: Good Barriers to Discharge: None OT Plan OT Intensity: Minimum of 1-2 x/day, 45 to 90 minutes OT Frequency: 5 out of 7 days OT Duration/Estimated Length of Stay: 12-14 days OT Treatment/Interventions: Balance/vestibular training;Discharge planning;DME/adaptive equipment instruction;Functional mobility training;Patient/family education;Self Care/advanced ADL retraining;Therapeutic Activities;Therapeutic Exercise;UE/LE Strength taining/ROM;UE/LE Coordination activities;Wheelchair propulsion/positioning   Skilled Therapeutic Intervention   OT Evaluation Precautions/Restrictions  Precautions Precautions: Fall Precaution Comments: R BKA Restrictions Weight Bearing Restrictions: Yes RLE Weight Bearing: Non weight bearing         Pain  none   Home Living/Prior Functioning Home Living Lives With:  Family Available Help at Discharge: Family;Available 24 hours/day Type  of Home: Apartment Home Access: Level entry Home Layout: One level;Full bath on main level Bathroom Shower/Tub: Engineer, manufacturing systems: Standard Bathroom Accessibility: Yes How Accessible: Accessible via wheelchair;Accessible via walker Home Adaptive Equipment: Hospital bed;Raised toilet seat with rails;Wheelchair - powered;Grab bars around toilet;Walker - rolling IADL History Homemaking Responsibilities: No Current License: No Mode of Transportation: Car Leisure and Hobbies: visiting with neighbors, tv Prior Function Level of Independence: Needs assistance with homemaking;Requires assistive device for independence;Needs assistance with ADLs Bath: Minimal Able to Take Stairs?: No Driving: No 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) Leisure: Hobbies-yes (Comment) Comments: visiting with neighbors ADL   Vision/Perception  Vision - History Baseline Vision: Wears glasses all the time Visual History: Cataracts (removed) Patient Visual Report: No change from baseline Vision - Assessment Eye Alignment: Within Functional Limits Vision Assessment: Vision not tested Perception Perception: Within Functional Limits  Cognition Overall Cognitive Status: Impaired at baseline Arousal/Alertness: Awake/alert Orientation Level: Disoriented to time;Oriented to place;Disoriented to situation Attention: Sustained Sustained Attention: Appears intact Memory: Impaired Memory Impairment: Storage deficit;Decreased short term memory;Decreased long term memory Problem Solving: Impaired Problem Solving Impairment: Verbal complex;Functional complex Behaviors: Other (comment) (anxious about standing) Sensation Sensation Light Touch: Appears Intact Coordination Gross Motor Movements are Fluid and Coordinated: Yes Fine Motor Movements are Fluid and Coordinated: Yes Heel Shin Test: unable to assess: R BKA Motor  Motor Motor: Within Functional Limits Motor - Skilled  Clinical Observations:  (max cues for motor planning sit to stand) Mobility  Bed Mobility Bed Mobility: Rolling Right;Rolling Left;Right Sidelying to Sit;Supine to Sit;Sitting - Scoot to Edge of Bed Rolling Right: 4: Min guard;With rail Rolling Right Details: Tactile cues for weight shifting;Manual facilitation for weight shifting Rolling Left: 4: Min guard;With rail Right Sidelying to Sit: 4: Min assist;With rails;HOB flat Right Sidelying to Sit Details: Tactile cues for weight shifting;Manual facilitation for weight shifting Supine to Sit: 4: Min guard;With rails;HOB flat Sitting - Scoot to Edge of Bed: 3: Mod assist Sitting - Scoot to [place] of Bed Details: Manual facilitation for weight shifting Transfers Sit to Stand: 1: +2 Total assist Sit to Stand Details: Manual facilitation for weight bearing;Manual facilitation for placement;Manual facilitation for weight shifting;Visual cues/gestures for sequencing Sit to Stand Details (indicate cue type and reason):  (anxious about standing) Stand to Sit: 1: +2 Total assist Stand to Sit Details (indicate cue type and reason): Manual facilitation for placement;Manual facilitation for weight bearing;Verbal cues for technique;Verbal cues for sequencing;Verbal cues for precautions/safety  Trunk/Postural Assessment  Cervical Assessment Cervical Assessment: Within Functional Limits Thoracic Assessment Thoracic Assessment: Exceptions to [facility] Thoracic AROM Overall Thoracic AROM: Other (comment) (decreased upright posture when standing) Lumbar Assessment Lumbar Assessment: Exceptions to [facility] Lumbar AROM Overall Lumbar AROM: Deficits Postural Control Postural Control: Deficits on evaluation Trunk Control: decreased upright posture when standing; flexed at hips and in thorax  Balance Balance Balance Assessed: Yes Static Sitting Balance Static Sitting - Balance Support: Bilateral upper extremity supported Static Sitting - Level of Assistance: 5:  Stand by assistance Static Standing Balance Static Standing - Level of Assistance: 1: +2 Total assist Extremity/Trunk Assessment RUE Assessment RUE Assessment: Within Functional Limits LUE Assessment LUE Assessment: Within Functional Limits       Refer to Care Plan for Long Term Goals  Recommendations for other services: None  Discharge Criteria: Patient will be discharged from OT if patient refuses treatment 3 consecutive times without medical reason, if treatment goals  not met, if there is a change in medical status, if patient makes no progress towards goals or if patient is discharged from hospital.  The above assessment, treatment plan, treatment alternatives and goals were discussed and mutually agreed upon: by patient  Humberto Seals 03/05/2012, 12:41 PM

## 2012-03-05 NOTE — Progress Notes (Signed)
Physical Therapy Session Note  Patient Details  Name: Judith [name] MRN: [id number] Date of Birth: 11/12/1940  Today's Date: 03/05/2012 Time: 1300-1400 Time Calculation (min): 60 min  Short Term Goals: Week 1:  PT Short Term Goal 1 (Week 1): Patient will be able to perform bed mobility with S/min-A (has hospital bed at home) PT Short Term Goal 2 (Week 1): Patient will be able to perform transfers with Mod-Assist  PT Short Term Goal 3 (Week 1): Patient will be able to perform wheelchair mobility and management x 150' with S/Independent assist.  Skilled Therapeutic Interventions/Progress Updates:  Balance/vestibular training;Functional mobility training;Patient/family education;Therapeutic Activities;Therapeutic Exercise;UE/LE Strength taining/ROM;Wheelchair propulsion/positioning   Therapy Documentation Precautions:  Precautions Precautions: Fall Precaution Comments: R BKA Restrictions Weight Bearing Restrictions: Yes RLE Weight Bearing: Non weight bearing Pain: denies pain   *Daughter-in-law, Angelique Blonder present throughout and able to clarify a number of things... 1. Aide 4 hours/day and son was staying evenings 2. Patient used prosthetic limb on Right LE 3. Always had Supervision for transfers and gait using RW 150' in hall with aide and assist to bathroom 4. Plan is to return to senior living apartment facility and will have Ulyses Southward, 25yo granddaughter, who has had CNA training, stay evenings with her until aide comes in ~ 11:30am.  Therapeutic Activity:(30) Transfer Training sit<->stand in and out of parallel bars and with Max-Assist, stand-pivot transfers Max-Assist, Bed Mobility with Min-A sit->supine and Mod-Assist with scooting to [facility] and patient using overhead trapeze Therapeutic Exercise:(15') L LE exercises in sitting and in supine Wheelchair Management:(15') propelling manual w/c 2 x 100' with verbal and tactile cues for turning w/c. Patient moves very slow with manual  w/c.  See FIM for current functional status  Therapy/Group: Individual Therapy  Luceal Hollibaugh J 03/05/2012, 1:06 PM

## 2012-03-05 NOTE — Evaluation (Signed)
Physical Therapy Assessment and Plan  Patient Details  Name: Judith [name] MRN: [id number] Date of Birth: [birth year]  PT Diagnosis: Difficulty walking, Muscle weakness and Pain in Right LE  Rehab Potential: Good ELOS: 10-14 days    Today's Date: 03/05/2012 Time: 4098-1191 Time Calculation (min): 60 min  Problem List:  Patient Active Problem List  Diagnosis  . Fracture of femur, distal, right, closed  . UTI (lower urinary tract infection)  . Hypertension  . Insulin dependent diabetes mellitus  . Acute blood loss anemia  . Hx of right BKA  . Fall  . Obesity (BMI 30-39.9)  . Poor nutrition    Past Medical History:  Past Medical History  Diagnosis Date  . Hypertension   . Insulin dependent diabetes mellitus 03/01/2012  . 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 Closed right ankle fracture   . Hyperlipidemia   . Fracture of femur, distal, right, closed 03/01/2012  . Obesity (BMI 30-39.9) 03/03/2012   Past Surgical History:  Past Surgical History  Procedure Laterality Date  . Below knee leg amputation      Right  . Orif femur fracture  03/01/2012    Procedure: OPEN REDUCTION INTERNAL FIXATION (ORIF) DISTAL FEMUR FRACTURE;  Surgeon: Budd Palmer, MD;  Location: [facility] OR;  Service: Orthopedics;  Laterality: Right;  ORIF right femur  . Orif femur fracture  03/03/2012    Dr Carola Frost   right leg    Assessment & Plan Clinical Impression:Judith [name] is a 72 y.o. right-handed female with history of insulin-dependent diabetes mellitus as well as right below-knee amputation 2010 and uses a prosthesis provided by [employer] as well as rolling walker. Patient is a resident of [facility]. Admitted 02/29/2012 after a recent fall at home without loss of consciousness. She was taken to the emergency room with findings of mildly displaced fracture of the distal femur placed in a splint maintain nonweightbearing status and discharged home. She presented to the  orthopedic office for followup x-rays repeated showing a displaced right distal femoral fracture. Underwent ORIF 03/01/2012 per Dr. Carola Frost. She remains nonweightbearing x6 weeks. Subcutaneous Lovenox added for DVT prophylaxis. Postoperative anemia 9.3 and monitored. Hemoglobin A1c of 8.0 and remains on insulin therapy. Placed on Keflex for preadmission urine tract infection Klebsiella 03/02/2012.    Patient transferred to CIR on 03/04/2012 .   Patient currently requires max with mobility secondary to muscle weakness.  Prior to hospitalization, patient was min with mobility and lived with Family (son will no longer be at apartment. Plan d/c to [address] apt) in a Apartment home.  Home access is  Level entry.  Patient will benefit from skilled PT intervention to maximize safe functional mobility, minimize fall risk and decrease caregiver burden for planned discharge initially to 2 nieces apartment before senior apartment.  Anticipate patient will benefit from follow up HH at discharge.  PT - End of Session Activity Tolerance: Tolerates 10 - 20 min activity with multiple rests Endurance Deficit: Yes Endurance Deficit Description: brief rest breaks between tasks  (decreased strength with bed mobility and transfers) PT Assessment Rehab Potential: Good Barriers to Discharge: None PT Plan PT Intensity: Minimum of 1-2 x/day ,45 to 90 minutes PT Frequency: Total of 15 hours over 7 days Frequency due to: decreased endurance, mobility, balance, strength, and safety. PT Duration Estimated Length of Stay: 10-14 days  PT Treatment/Interventions: Balance/vestibular training;Functional mobility training;Patient/family education;Therapeutic Activities;Therapeutic Exercise;UE/LE Strength taining/ROM;Wheelchair propulsion/positioning PT Recommendation Follow Up  Recommendations: Home health PT Patient destination: Home ([address]-bedr[address] apartment initially)  Skilled Therapeutic Intervention   PT  Evaluation Precautions/Restrictions Precautions Precautions: Fall Restrictions Weight Bearing Restrictions: Yes RLE Weight Bearing: Non weight bearing General Chart Reviewed: Yes Family/Caregiver Present: No Vital SignsTherapy Vitals Temp: 99 F (37.2 C) Temp src: Oral Pulse Rate: 77 Resp: 18 BP: 147/76 mmHg Patient Position, if appropriate: Lying Oxygen Therapy SpO2: 94 % O2 Device: None (Room air) Pain Pain Assessment Pain Assessment: No/denies pain Pain Score:   1 Pain Location: Leg Pain Orientation: Right Pain Intervention(s): Repositioned;Medication (See eMAR) Home Living/Prior Functioning Home Living Lives With: Family (son will no longer be at apartment. Plan d/c to [address] apt) Available Help at Discharge: Family;Available 24 hours/day Type of Home: Apartment Home Access: Level entry Home Layout: One level;Full bath on main level Bathroom Shower/Tub: Engineer, manufacturing systems: Standard Bathroom Accessibility: Yes How Accessible: Accessible via wheelchair;Accessible via walker Home Adaptive Equipment: Bedside commode/3-in-1;Grab bars in shower;Walker - rolling;Wheelchair - Architectural technologist with back Prior Function Level of Independence: Independent with basic ADLs;Independent with gait;Independent with transfers Bath: Minimal (aide 5 days/week and assisted with bathing) Able to Take Stairs?: No Driving: No 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) Leisure: Hobbies-yes (Comment) (bingo) Vision/Perception  Vision - History Baseline Vision: Wears glasses all the time Visual History: Cataracts (removed bilaterally) Patient Visual Report: No change from baseline Vision - Assessment Eye Alignment: Within Functional Limits Perception Perception: Within Functional Limits  Cognition Arousal/Alertness: Awake/alert Orientation Level: Oriented X4 Sensation Sensation Light Touch: Appears Intact Motor  Motor Motor:  (R BKA)  Mobility Bed Mobility Bed  Mobility: Rolling Right;Rolling Left;Right Sidelying to Sit;Supine to Sit;Sitting - Scoot to Edge of Bed Rolling Right: 4: Min guard;With rail Rolling Right Details: Tactile cues for weight shifting;Manual facilitation for weight shifting Rolling Left: 4: Min guard;With rail Right Sidelying to Sit: 4: Min assist;With rails;HOB flat Right Sidelying to Sit Details: Tactile cues for weight shifting;Manual facilitation for weight shifting Supine to Sit: 4: Min guard;With rails;HOB flat Sitting - Scoot to Edge of Bed: 3: Mod assist Sitting - Scoot to Edge of Bed Details: Manual facilitation for weight shifting Transfers Sit to Stand: 2: Max assist;From bed;With upper extremity assist Sit to Stand Details: Tactile cues for weight shifting;Verbal cues for technique;Verbal cues for precautions/safety;Manual facilitation for weight shifting Stand to Sit: 2: Max assist;With upper extremity assist Stand to Sit Details (indicate cue type and reason): Tactile cues for placement;Verbal cues for technique;Manual facilitation for weight shifting Stand Pivot Transfers: 2: Max assist;From elevated surface Stand Pivot Transfer Details: Tactile cues for weight shifting;Tactile cues for posture;Tactile cues for placement;Verbal cues for sequencing;Verbal cues for technique;Verbal cues for precautions/safety;Manual facilitation for weight shifting Locomotion  Ambulation Ambulation: No (patient will be NWB on R LE x 6 weeks  (R BKA)) Ambulation/Gait Assistance: Not tested (comment) Assistive device: Rolling walker Gait Gait: No Stairs / Additional Locomotion Stairs: No (NA) Ramp:  (NA) Curb:  (NA) Naval architect Mobility: Yes Wheelchair Assistance: 4: Min assist (assist with LE rests) Wheelchair Assistance Details: Verbal cues for precautions/safety;Manual facilitation for Water quality scientist: Both upper extremities Wheelchair Parts Management: Supervision/cueing Distance: 30'  inside room  Trunk/Postural Assessment  Cervical Assessment Cervical Assessment: Within Functional Limits Thoracic Assessment Thoracic Assessment: Within Functional Limits Lumbar Assessment Lumbar Assessment: Exceptions to [facility] (lumbar flexion in sitting. posterior pelvis) Postural Control Postural Control: Deficits on evaluation (flexed trunk in sitting and difficulty advancing hips to EOB)  Balance Balance Balance Assessed: Yes  Static Sitting Balance Static Sitting - Balance Support: Bilateral upper extremity supported;Feet unsupported Static Sitting - Level of Assistance: 5: Stand by assistance Extremity Assessment  RLE Assessment RLE Assessment: Exceptions to [facility] (R hip 3+/5, knee 3/5 by observation (R BKA)) LLE Assessment LLE Assessment: Within Functional Limits (Charcot Foot)  FIM:  FIM - Locomotion: Wheelchair Distance: 30' inside room FIM - Locomotion: Ambulation Ambulation/Gait Assistance: Not tested (comment)   Refer to Care Plan for Long Term Goals  Recommendations for other services: None  Discharge Criteria: Patient will be discharged from PT if patient refuses treatment 3 consecutive times without medical reason, if treatment goals not met, if there is a change in medical status, if patient makes no progress towards goals or if patient is discharged from hospital.  The above assessment, treatment plan, treatment alternatives and goals were discussed and mutually agreed upon: by patient  Rex Kras 03/05/2012, 9:51 AM

## 2012-03-05 NOTE — Progress Notes (Signed)
Judith [name] is a 72 y.o. female 11-13-1940 [id number]  Subjective: No new complaints. No new problems. Slept well. Feeling OK.  Objective: Vital signs in last 24 hours: Temp:  [98.3 F (36.8 C)-99 F (37.2 C)] 99 F (37.2 C) (02/08 0601) Pulse Rate:  [77-78] 77 (02/08 0601) Resp:  [18] 18 (02/08 0601) BP: (125-147)/(65-76) 147/76 mmHg (02/08 0601) SpO2:  [94 %-98 %] 94 % (02/08 0601) Weight:  [90.357 kg (199 lb 3.2 oz)] 90.357 kg (199 lb 3.2 oz) (02/07 1430) Weight change:  Last BM Date: 03/05/12  Intake/Output from previous day: 02/07 0701 - 02/08 0700 In: 840 [P.O.:840] Out: -  Last cbgs: CBG (last 3)   Recent Labs  03/04/12 2211 03/04/12 2213 03/04/12 2236  GLUCAP 60* 64* 99     Physical Exam General: No apparent distress  -working with PT in room at time of my visit  Lungs: Normal effort. Lungs clear to auscultation, no crackles or wheezes. Cardiovascular: Regular rate and rhythm, no edema Musculoskeletal:  Neurovascularly intact. S/p R BKA Neurological: No new neurological deficits Wounds: Clean, dry, intact. No signs of infection.  Lab Results: BMET    Component Value Date/Time   NA 138 03/03/2012 0620   K 3.9 03/03/2012 0620   CL 100 03/03/2012 0620   CO2 27 03/03/2012 0620   GLUCOSE 205* 03/03/2012 0620   BUN 14 03/03/2012 0620   CREATININE 0.70 03/04/2012 1523   CALCIUM 8.9 03/03/2012 0620   GFRNONAA 85* 03/04/2012 1523   GFRAA >90 03/04/2012 1523   CBC    Component Value Date/Time   WBC 7.2 03/04/2012 1523   RBC 3.27* 03/04/2012 1523   HGB 8.5* 03/04/2012 1523   HCT 26.6* 03/04/2012 1523   PLT 315 03/04/2012 1523   MCV 81.3 03/04/2012 1523   MCH 26.0 03/04/2012 1523   MCHC 32.0 03/04/2012 1523   RDW 14.8 03/04/2012 1523   LYMPHSABS 0.9 02/24/2012 1617   MONOABS 0.5 02/24/2012 1617   EOSABS 0.0 02/24/2012 1617   BASOSABS 0.0 02/24/2012 1617    Studies/Results: No results found.  Medications: I have reviewed the patient's current  medications.  Assessment/Plan: Functional deficits secondary to right supracondylar femur fx, s/p ORIF 03/01/12  1. Right supracondylar femur fracture status post ORIF 03/01/2012  2. DVT Prophylaxis/Anticoagulation: Subcutaneous Lovenox. Monitor platelet counts -no signs of bleeding  3. Pain Management: Hydrocodone and Robaxin as needed. Monitor with increased mobility.  4. Neuropsych: This patient is capable of making decisions on her own behalf.  5. Postoperative anemia.  Patient is asymptomatic  6. History right below-knee amputation 2010. Patient does have a prosthesis per [employer]  7. Diabetes mellitus with peripheral neuropathy. Hemoglobin A1c 8.0. Levemir 20 units daily and NovoLog 4 units TID. Check blood sugars a.c. and at bedtime. Titrate regimen as needed.  8. Hypertension. Clonidine 0.05 mg daily. Monitor with increased activity  9. Preadmission Klebsiella urine tract infection. Keflex initiated 03/02/2012. Patient denies dysuria  10. Hyperlipidemia. Zocor  Length of stay, days: 1  IRETON,SUSAN C , PA-C 03/05/2012, 9:17 AM  I have examined the patient and agree with above.  Alaisha Eversley A. Felicity Coyer, MD 10:40 AM

## 2012-03-06 ENCOUNTER — Inpatient Hospital Stay (HOSPITAL_COMMUNITY): Payer: Medicare Other | Admitting: *Deleted

## 2012-03-06 LAB — GLUCOSE, CAPILLARY
Glucose-Capillary: 145 mg/dL — ABNORMAL HIGH (ref 70–99)
Glucose-Capillary: 61 mg/dL — ABNORMAL LOW (ref 70–99)
Glucose-Capillary: 178 mg/dL — ABNORMAL HIGH (ref 70–99)

## 2012-03-06 NOTE — Progress Notes (Signed)
Judith [name] is a 72 y.o. female 1940-08-31 [id number]  Subjective: No complaints. No new problems. Slept well. Feels well.  Objective: Vital signs in last 24 hours: Temp:  [98.1 F (36.7 C)-99 F (37.2 C)] 99 F (37.2 C) (02/09 0620) Pulse Rate:  [80] 80 (02/09 0620) Resp:  [18] 18 (02/09 0620) BP: (131-146)/(66-75) 146/66 mmHg (02/09 0620) SpO2:  [94 %-95 %] 95 % (02/09 0620) Weight change:  Last BM Date: 03/05/12  Intake/Output from previous day: 02/08 0701 - 02/09 0700 In: 840 [P.O.:840] Out: -  Last cbgs: CBG (last 3)   Recent Labs  03/05/12 1635 03/05/12 2028 03/06/12 0733  GLUCAP 80 82 145*     Physical Exam General: No apparent distress    Lungs: Normal effort. Lungs clear to auscultation, no crackles or wheezes. Cardiovascular: Regular rate and rhythm, no edema Musculoskeletal:  Neurovascularly intact. S/p R BKA Wounds: Clean, dry, intact. No signs of infection.  Lab Results: BMET    Component Value Date/Time   NA 138 03/03/2012 0620   K 3.9 03/03/2012 0620   CL 100 03/03/2012 0620   CO2 27 03/03/2012 0620   GLUCOSE 205* 03/03/2012 0620   BUN 14 03/03/2012 0620   CREATININE 0.70 03/04/2012 1523   CALCIUM 8.9 03/03/2012 0620   GFRNONAA 85* 03/04/2012 1523   GFRAA >90 03/04/2012 1523   CBC    Component Value Date/Time   WBC 7.2 03/04/2012 1523   RBC 3.27* 03/04/2012 1523   HGB 8.5* 03/04/2012 1523   HCT 26.6* 03/04/2012 1523   PLT 315 03/04/2012 1523   MCV 81.3 03/04/2012 1523   MCH 26.0 03/04/2012 1523   MCHC 32.0 03/04/2012 1523   RDW 14.8 03/04/2012 1523   LYMPHSABS 0.9 02/24/2012 1617   MONOABS 0.5 02/24/2012 1617   EOSABS 0.0 02/24/2012 1617   BASOSABS 0.0 02/24/2012 1617    Studies/Results: No results found.  Medications: I have reviewed the patient's current medications.  Assessment/Plan: Functional deficits secondary to right supracondylar femur fx, s/p ORIF 03/01/12  1. Right supracondylar femur fracture status post ORIF 03/01/2012  2. DVT  Prophylaxis/Anticoagulation: Subcutaneous Lovenox. Monitor platelet counts -no signs of bleeding  3. Pain Management: Hydrocodone and Robaxin as needed. Monitor with increased mobility.  4. Neuropsych: This patient is capable of making decisions on her own behalf.  5. Postoperative anemia.  Patient is asymptomatic  6. History right below-knee amputation 2010. Patient does have a prosthesis per advanced prosthetics  7. Diabetes mellitus with peripheral neuropathy. Hemoglobin A1c 8.0. Levemir 20 units daily and NovoLog 4 units TID. Check blood sugars a.c. and at bedtime. Titrate regimen as needed.  8. Hypertension. Clonidine 0.05 mg daily. Monitor with increased activity  9. Preadmission Klebsiella urine tract infection. Keflex initiated 03/02/2012. Patient denies dysuria  10. Hyperlipidemia. Zocor  Length of stay, days: 2  Rene Paci , MD  03/06/2012, 9:10 AM

## 2012-03-06 NOTE — Progress Notes (Signed)
Physical Therapy Session Note  Patient Details  Name: Judith [name] MRN: [id number] Date of Birth: 1940-10-30  Today's Date: 03/06/2012 Time: 4098-1191 Time Calculation (min): 45 min  W/C mobility: propulsion in the room, focused on maneuvering in small space and control when turning, propulsion in the hall 2 x 50 feet with min A and with VC for hand placement. Therapeutic Activities: transfer training chair to w/c- with RW, mod A and cues for sequencing ,  Sit to stand with RW x 1 mod A stranding for 45 seconds. In II bars ,pull to stand  X 3 ,1 minute standing with min A. Patient requires max A to perform stand to sit transfer , poor control due to decreased strength and fear. Exercises to increase L LE strength with 3 lbs weights.  Wheelchair pushups 2 x 10. Patient stated she has a great fear of falling, reassurance has been  provided.          Therapy Documentation Precautions:  Precautions Precautions: Fall Precaution Comments: R BKA Restrictions Weight Bearing Restrictions: Yes RLE Weight Bearing: Non weight bearing Vital Signs: Therapy Vitals Temp: 99 F (37.2 C) Temp src: Oral Pulse Rate: 80 Resp: 18 BP: 146/66 mmHg Patient Position, if appropriate: Lying Oxygen Therapy SpO2: 95 % O2 Device: None (Room air) Pain: Pain Assessment Pain Assessment: No/denies pain Mobility:   Locomotion : Wheelchair Mobility Distance: 50 feet with minA and cues for UE placement to facilitate propulsion    Therapy/Group: Individual Therapy  Dorna Mai 03/06/2012, 8:28 AM

## 2012-03-07 ENCOUNTER — Inpatient Hospital Stay (HOSPITAL_COMMUNITY): Payer: Medicare Other | Admitting: *Deleted

## 2012-03-07 ENCOUNTER — Inpatient Hospital Stay (HOSPITAL_COMMUNITY): Payer: Medicare Other | Admitting: Occupational Therapy

## 2012-03-07 ENCOUNTER — Inpatient Hospital Stay (HOSPITAL_COMMUNITY): Payer: Medicare Other | Admitting: Physical Therapy

## 2012-03-07 DIAGNOSIS — S72453A Displaced supracondylar fracture without intracondylar extension of lower end of unspecified femur, initial encounter for closed fracture: Secondary | ICD-10-CM

## 2012-03-07 DIAGNOSIS — W19XXXA Unspecified fall, initial encounter: Secondary | ICD-10-CM

## 2012-03-07 LAB — COMPREHENSIVE METABOLIC PANEL
ALT: 7 U/L (ref 0–35)
AST: 13 U/L (ref 0–37)
Albumin: 2.5 g/dL — ABNORMAL LOW (ref 3.5–5.2)
CO2: 28 mEq/L (ref 19–32)
Calcium: 9 mg/dL (ref 8.4–10.5)
Chloride: 100 mEq/L (ref 96–112)
GFR calc non Af Amer: 86 mL/min — ABNORMAL LOW (ref >90)
Sodium: 138 mEq/L (ref 135–145)
Total Bilirubin: 0.3 mg/dL (ref 0.3–1.2)

## 2012-03-07 LAB — GLUCOSE, CAPILLARY
Glucose-Capillary: 232 mg/dL — ABNORMAL HIGH (ref 70–99)
Glucose-Capillary: 44 mg/dL — CL (ref 70–99)
Glucose-Capillary: 78 mg/dL (ref 70–99)
Glucose-Capillary: 182 mg/dL — ABNORMAL HIGH (ref 70–99)

## 2012-03-07 LAB — CBC WITH DIFFERENTIAL/PLATELET
Basophils Absolute: 0.1 10*3/uL (ref 0.0–0.1)
Basophils Relative: 1 % (ref 0–1)
Lymphocytes Relative: 23 % (ref 12–46)
MCHC: 31.4 g/dL (ref 30.0–36.0)
Neutro Abs: 4.8 10*3/uL (ref 1.7–7.7)
Neutrophils Relative %: 65 % (ref 43–77)
Platelets: 386 10*3/uL (ref 150–400)
RDW: 15.2 % (ref 11.5–15.5)
WBC: 7.4 10*3/uL (ref 4.0–10.5)

## 2012-03-07 NOTE — Significant Event (Signed)
hyHypoglycemic Event  CBG: 78  Treatment: 15 GM carbohydrate snack  Symptoms: None  Follow-up CBG: Time:117 CBG Result:1800  Possible Reasons for Event:   Comments/MD notified:    Chana Bode B  Remember to initiate Hypoglycemia Order Set & complete

## 2012-03-07 NOTE — Progress Notes (Signed)
Occupational Therapy Session Note  Patient Details  Name: Judith [name] MRN: [id number] Date of Birth: August 11, 1940  Today's Date: 03/07/2012 Time: 0931-1030 Time Calculation (min): 59 min  Short Term Goals: Week 1:  OT Short Term Goal 1 (Week 1): Pt. wiill be stand for periarea care with mod assist OT Short Term Goal 2 (Week 1): Pt. will be mmoderate assist with LB bathing OT Short Term Goal 3 (Week 1): Pt. will be moderate assist with donning pants OT Short Term Goal 4 (Week 1): Pt. will transfer to [facility] with mod assist OT Short Term Goal 5 (Week 1): Pt. will recall sequence for going from sit to stand  Skilled Therapeutic Interventions/Progress Updates:    Pt seen for ADL retraining session  with focus on sit<>stand for LB bathing and increased participation with LB dressing. Pt in WC upon arrival, completed UB bathing and dressing seated at sink with setup. Pt required max A +2 to stand X3 to complete LB bathing and to don brief, requiring max VC for sequencing and hand placement during sit<>stand.  Pt able to thread pants with Min A in sitting, with Max A to stand and a second helper to pull up pants. Pt unable to don Lt sock and shoe due to pain in hips with crossing ankle over knee. Pt completed 2 sets of 5 WC pushups in preparation for sit<>stands. Pt requested to go back to bed, unable to use sliding board due to height difference from [facility] to bed. Completed stand pivot transfer with max A +2 and Mod A for bed mobility.   Therapy Documentation Precautions:  Precautions Precautions: Fall Precaution Comments: R BKA Restrictions Weight Bearing Restrictions: Yes RLE Weight Bearing: Non weight bearing Other Position/Activity Restrictions: NWB R LE :   Pain: Pain Assessment Pain Assessment: No/denies pain Pain Score: 0-No pain     See FIM for current functional status  Therapy/Group: Individual Therapy  [name] 03/07/2012, 11:01 AM

## 2012-03-07 NOTE — Significant Event (Signed)
Hypoglycemic Event  CBG: 43  Treatment: 15 GM carbohydrate snack  Symptoms: None  Follow-up CBG: Time:1715 CBG Result:78  Possible Reasons for Event: Medication regimen  Comments/MD notified:given glucerna with dinner tray    Judith [name] B  Remember to initiate Hypoglycemia Order Set & complete

## 2012-03-07 NOTE — Significant Event (Signed)
hypoHypoglycemic Event  CBG: 44  Treatment: 15 GM carbohydrate snack  Symptoms: Sleepy  Follow-up CBG: Time:43 CBG Result:1710  Possible Reasons for Event: Medication regimen: levamir usually taken at night without meal coverage  Comments/MD notified:    Judith [name] B  Remember to initiate Hypoglycemia Order Set & complete

## 2012-03-07 NOTE — Progress Notes (Signed)
Reviewed and agree with the attached treatment note.  Anayiah Howden, OTR/L 

## 2012-03-07 NOTE — Progress Notes (Signed)
Patient information reviewed and entered into eRehab system by Zabella Wease, RN, CRRN, PPS Coordinator.  Information including medical coding and functional independence measure will be reviewed and updated through discharge.     Per nursing patient was given "Data Collection Information Summary for Patients in Inpatient Rehabilitation Facilities with attached "Privacy Act Statement-Health Care Records" upon admission.  

## 2012-03-07 NOTE — Plan of Care (Signed)
Problem: RH BLADDER ELIMINATION Goal: RH STG MANAGE BLADDER WITH ASSISTANCE STG Manage Bladder With Min Assistance  Outcome: Not Progressing callling for toileting for bladder elimination however staff needed to help pt roll onto and off of and complete pericare  Problem: RH SKIN INTEGRITY Goal: RH STG SKIN FREE OF INFECTION/BREAKDOWN independently  Outcome: Not Progressing Staff helping with bath; reports son/grandaughter did this PTA; may need to revise goal Goal: RH STG ABLE TO PERFORM INCISION/WOUND CARE W/ASSISTANCE STG Able To Perform Incision/Wound Care With min Assistance.  Outcome: Not Progressing mepilex dressing q 3 days no longer using ioban

## 2012-03-07 NOTE — Progress Notes (Signed)
Occupational Therapy Session Note  Patient Details  Name: Judith [name] MRN: [id number] Date of Birth: [birth year]  Today's Date: 03/07/2012 Time: 4098-1191 Time Calculation (min): 45 min  Short Term Goals: Week 1:  OT Short Term Goal 1 (Week 1): Pt. wiill be stand for periarea care with mod assist OT Short Term Goal 2 (Week 1): Pt. will be mmoderate assist with LB bathing OT Short Term Goal 3 (Week 1): Pt. will be moderate assist with donning pants OT Short Term Goal 4 (Week 1): Pt. will transfer to [facility] with mod assist OT Short Term Goal 5 (Week 1): Pt. will recall sequence for going from sit to stand  Skilled Therapeutic Interventions/Progress Updates:    Pt seen for 1:1 OT with focus on transfers, scooting, and lateral leans to increase participation in sit <> stand and transfers.  Pt in bed upon arrival and completed scoot/squat pivot to w/c with max assist with second person for safety.  Use of slide board for transfer from w/c to therapy mat with pt reporting appropriate hand placement and need to keep weight forward (not lean backwards) with transfer to ensure proper placement and ease with transfer.  Pt mod assist with slide board.  Engaged in scooting to Rt and Lt, this time with cues for anterior pelvic tilt to increase forward weight shift with clearance of mat.  Lateral leans down to elbow to promote weight shifting and weight bearing through elbow to increase participation in bed mobility.  Pt completed transfer back to w/c with slide board with mod assist and carryover of appropriate hand placement and body positioning.  Use of arm rests to boost self to increase positioning in w/c.  Therapy Documentation Precautions:  Precautions Precautions: Fall Precaution Comments: R BKA Restrictions Weight Bearing Restrictions: Yes RLE Weight Bearing: Non weight bearing Other Position/Activity Restrictions: NWB R LE Pain:  Pt with no c/o pain this session.  See FIM for current  functional status  Therapy/Group: Individual Therapy  Rosalio Loud, OTR/L 03/07/2012, 3:19 PM

## 2012-03-07 NOTE — Care Management Note (Signed)
Inpatient Rehabilitation Center Individual Statement of Services  Patient Name:  Judith [name]  Date:  03/07/2012  Welcome to the Inpatient Rehabilitation Center.  Our goal is to provide you with an individualized program based on your diagnosis and situation, designed to meet your specific needs.  With this comprehensive rehabilitation program, you will be expected to participate in at least 3 hours of rehabilitation therapies Monday-Friday, with modified therapy programming on the weekends.  Your rehabilitation program will include the following services:  Physical Therapy (PT), Occupational Therapy (OT), 24 hour per day rehabilitation nursing, Therapeutic Recreaction (TR), Case Management ( Social Worker), Rehabilitation Medicine, Nutrition Services and Pharmacy Services  Weekly team conferences will be held on Wednesday to discuss your progress.  Your  Social Worker will talk with you frequently to get your input and to update you on team discussions.  Team conferences with you and your family in attendance may also be held.  Expected length of stay: 12-14 days Overall anticipated outcome: min w/c level  Depending on your progress and recovery, your program may change.  Your RN Case Estate agent will coordinate services and will keep you informed of any changes.  Your  SW name and contact number are listed  below.  The following services may also be recommended but are not provided by the Inpatient Rehabilitation Center:   Home Health Rehabiltiation Services  Outpatient Rehabilitatation Servives   Arrangements will be made to provide these services after discharge if needed.  Arrangements include referral to agencies that provide these services.  Your insurance has been verified to be:  Medicare & medicaid Your primary doctor is:  Dr Concepcion Elk  Pertinent information will be shared with your doctor and your insurance company.   Social Worker:  Dossie Der, [place]  [phone]  Information discussed with and copy given to patient by: Lucy Chris, 03/07/2012, 10:20 AM

## 2012-03-07 NOTE — Progress Notes (Signed)
Physical Therapy Session Note  Patient Details  Name: Judith [name] MRN: [id number] Date of Birth: [birth year]  Today's Date: 03/07/2012 Time: 0832-0930 Time Calculation (min): 58 min  Short Term Goals: Week 1:  PT Short Term Goal 1 (Week 1): Patient will be able to perform bed mobility with S/min-A (has hospital bed at home) PT Short Term Goal 2 (Week 1): Patient will be able to perform transfers with Mod-Assist  PT Short Term Goal 3 (Week 1): Patient will be able to perform wheelchair mobility and management x 150' with S/Independent assist.  Skilled Therapeutic Interventions/Progress Updates:    Patient received supine in bed. Attempted sit to stand with RW to get out of bed. Multiple attempts with bed elevated, but patient unable to perform stand secondary to increased anxiety and patient appearing to not want to put weight through L LE to assist with stand. Performed scooting transfer bed>wheelchair with max assist. Patient hesitant to perform anterior weight shift to assist with lifting and clearing surface during transfer. Patient performed wheelchair mobility in room (practicing turns and negotiation of obstacles in close space) and hallway x55' with supervision; patient requires increased time to complete.  Several attempts for sit>stand in front of parallel bars to decrease patient anxiety and provide UE support. Unable to achieve standing with max assist. Patient appearing very passive during attempts and not using L LE to assist with standing, L foot often not placed on floor despite tactile and verbal cues for proper placement prior to attempting stand. Patient's granddaughter, Angelena Sole, present for remainder of session. Initiated transfer training with slideboard, but discontinued secondary to patient wearing gown and interference of skin making it difficult to perform transfer on slideboard. May want to attempt again. Patient performed lateral/scoot transfer wheelchair<>mat with max  assist. Patient demonstrates difficulty with sequencing and technique and hesitancy with weight shifts anteriorly.  Patient performed wheelchair mobility x55' back towards room, requiring supervison and increased time to complete. Patient left seated in wheelchair with granddaughter present and all needs within reach.   Therapy Documentation Precautions:  Precautions Precautions: Fall Precaution Comments: R BKA Restrictions Weight Bearing Restrictions: Yes RLE Weight Bearing: Non weight bearing Other Position/Activity Restrictions: NWB R LE Pain: Pain Assessment Pain Assessment: No/denies pain Pain Score: 0-No pain Mobility: Bed Mobility Bed Mobility: Sitting - Scoot to Edge of Bed;Supine to Sit Supine to Sit: 4: Min assist;HOB elevated;With rails Supine to Sit Details: Tactile cues for initiation;Verbal cues for precautions/safety;Verbal cues for sequencing;Verbal cues for technique Sitting - Scoot to Edge of Bed: 3: Mod assist;With rail Sitting - Scoot to [place] of Bed Details: Manual facilitation for placement;Manual facilitation for weight shifting;Verbal cues for precautions/safety;Verbal cues for sequencing;Verbal cues for technique Transfers Lateral/Scoot Transfers: 2: Max assist;From elevated surface;With armrests removed;With slide board;Other (comment) (with and without slide board attempted) Lateral/Scoot Transfer Details: Manual facilitation for weight shifting;Manual facilitation for placement;Verbal cues for precautions/safety;Verbal cues for technique;Verbal cues for sequencing;Tactile cues for weight shifting;Tactile cues for initiation;Visual cues for safe use of DME/AE Lateral/Scoot Transfer Details (indicate cue type and reason): Patient demonstrates difficulty with sequencing during both lateral/scooting transfers and transfers with slideboard. Patient very passive with transfer training and requires max multimodal cues to initiate and participate. Locomotion  : Ambulation Ambulation: No Ambulation/Gait Assistance: Not tested (comment) Gait Gait: No Stairs / Additional Locomotion Stairs: No Wheelchair Mobility Wheelchair Mobility: Yes Wheelchair Assistance: 5: Supervision;Other (comment) (requires increased time to complete) Wheelchair Assistance Details: Verbal cues for precautions/safety;Verbal cues for technique;Verbal cues for sequencing  Wheelchair Propulsion: Both upper extremities Wheelchair Parts Management: Needs assistance Distance: 35' x2   See FIM for current functional status  Therapy/Group: Individual Therapy  Chipper Herb. Jonaven Hilgers, PT, DPT  03/07/2012, 10:24 AM

## 2012-03-07 NOTE — Progress Notes (Signed)
Social Work Assessment and Plan Social Work Assessment and Plan  Patient Details  Name: Judith [name] MRN: [id number] Date of Birth: 02/03/1940  Today's Date: 03/07/2012  Problem List:  Patient Active Problem List  Diagnosis  . Fracture of femur, distal, right, closed  . UTI (lower urinary tract infection)  . Hypertension  . Insulin dependent diabetes mellitus  . Acute blood loss anemia  . Hx of right BKA  . Fall  . Obesity (BMI 30-39.9)  . Poor nutrition   Past Medical History:  Past Medical History  Diagnosis Date  . Hypertension   . Insulin dependent diabetes mellitus 03/01/2012  . 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 Closed right ankle fracture   . Hyperlipidemia   . Fracture of femur, distal, right, closed 03/01/2012  . Obesity (BMI 30-39.9) 03/03/2012   Past Surgical History:  Past Surgical History  Procedure Laterality Date  . Below knee leg amputation      Right  . Orif femur fracture  03/01/2012    Procedure: OPEN REDUCTION INTERNAL FIXATION (ORIF) DISTAL FEMUR FRACTURE;  Surgeon: Budd Palmer, MD;  Location: [facility] OR;  Service: Orthopedics;  Laterality: Right;  ORIF right femur  . Orif femur fracture  03/03/2012    Dr Carola Frost   right leg   Social History:  reports that she has never smoked. She has never used smokeless tobacco. She reports that she does not drink alcohol or use illicit drugs.  Family / Support Systems Marital Status: Widow/Widower Patient Roles: Parent ChildrenJunious Silk [phone]  Denise-d-i-l [phone] Other Supports: Jayme Cloud and breanna-grandaughter's  Anticipated Caregiver: Both granddaughter's to assist with her evening care, has CAP worker during the day Ability/Limitations of Caregiver: can physically assist Caregiver Availability: 24/7 Family Dynamics: Pt has seven children most of them are in [place] but a few with her granddaughter's are here.  She is close with all of them and they are supportive of her.  Social History Preferred  language: English Religion: Baptist Cultural Background: No issues Education: [employer] Read: Yes Write: Yes Employment Status: Retired Fish farm manager Issues: No issues Guardian/Conservator: None-according to MD pt is capable of making her own decisions.     Abuse/Neglect Physical Abuse: Denies Verbal Abuse: Denies Sexual Abuse: Denies Exploitation of patient/patient's resources: Denies Self-Neglect: Denies  Emotional Status Pt's affect, behavior adn adjustment status: Pt is motivated to imporve but feels she is deconditioned and needs to regain her strength back first.  She plans to work hard and do waht she can but feels she was not that mobile before except with her power chair. Recent Psychosocial Issues: Other medical issues-walked short distances with her prothesis but mostly used her power chair. Pyschiatric History: No history-deferred depression screen due to pt felt it was not necessary.  She feels she is doing ok with all of this and needs to heal and then can get back to her funcitoning.  Will monitor her coping while here.  Granddaughter reports she the same and doesn't notice any difference.  Patient / Family Perceptions, Expectations & Goals Pt/Family understanding of illness & functional limitations: Pt and granddaughter can explain her fracture and NWB status.  Both are aware of reason she came to rehab and want her to improve before going home.  Granddaughter aware she will need someone with her at discharge. Premorbid pt/family roles/activities: Mother, grandmother, retiree, Amputee, [employer], etc Anticipated changes in roles/activities/participation: resume Pt/family expectations/goals: Pt states: " I want to transfer better  and be less care by dishcarge. "  Jolonda states: " I hope she can help [place] take care of her."  She plans to be here to observe in therapies so she has a good idea of her care.  Community Resources [employer]: Other  (Comment) (Had prothetic clinic in the past) Premorbid Home Care/DME Agencies: Other (Comment) (Had [facility] one month ago) Transportation available at discharge: Family Members Resource referrals recommended: Support group (specify) (Amputee Support group)  Discharge Planning Living Arrangements: Alone Support Systems: Children;Other relatives;Friends/neighbors;Church/faith community;Home care staff Type of Residence: Private residence Insurance Resources: Medicare;Medicaid (specify county) Medical sales representative Co) Surveyor, quantity Resources: Restaurant manager, fast food Screen Referred: No Living Expenses: Rent Do you have any problems obtaining your medications?: No Home Management: Family and CAP worker Patient/Family Preliminary Plans: Return to apartment with her CAP worker and granddaughter's staying with her in the evenings.  Between family and CAP worker pt should have 24 hour care Social Work Anticipated Follow Up Needs: HH/OP;Support Group DC Planning Additional Notes/Comments: Family aware pt will require assistance at discharge from rehab  Clinical Impression Pleasant female who is willing to work but feels she has a lot of strength fo regain.  Granddaughter-Jolonda is present and observing in therapies and aware pt will require 24 hour care at discharge. Await evaluations and work on a safe discharge plan.  Lucy Chris 03/07/2012, 12:49 PM

## 2012-03-07 NOTE — Progress Notes (Signed)
Patient ID: Judith [name], female   DOB: Dec 06, 1940, 72 y.o.   MRN: [id number] Subjective/Complaints: 72 y.o. right-handed female with history of insulin-dependent diabetes mellitus as well as right below-knee amputation 2010 and uses a prosthesis provided by [employer] as well as rolling walker. Patient is a resident of [facility]. Admitted 02/29/2012 after a recent fall at home without loss of consciousness. She was taken to the emergency room with findings of mildly displaced fracture of the distal femur placed in a splint maintain nonweightbearing status and discharged home. She presented to the orthopedic office for followup x-rays repeated showing a displaced right distal femoral fracture. Underwent ORIF 03/01/2012 per Dr. Carola Frost. She remains nonweightbearing x6 weeks  Sleeping ok No pain in R thigh Review of Systems  Constitutional: Negative for fever and chills.  Respiratory: Negative for cough and shortness of breath.   Gastrointestinal: Negative for diarrhea and constipation.  Genitourinary: Negative for dysuria.  All other systems reviewed and are negative.    Objective: Vital Signs: Blood pressure 151/75, pulse 81, temperature 98.8 F (37.1 C), temperature source Oral, resp. rate 18, height 5\' 4"  (1.626 m), weight 90.357 kg (199 lb 3.2 oz), SpO2 93.00%. No results found. Results for orders placed during the hospital encounter of 03/04/12 (from the past 72 hour(s))  CBC     Status: Abnormal   Collection Time    03/04/12  3:23 PM      Result Value Range   WBC 7.2  4.0 - 10.5 K/uL   RBC 3.27 (*) 3.87 - 5.11 MIL/uL   Hemoglobin 8.5 (*) 12.0 - 15.0 g/dL   HCT 40.9 (*) 81.1 - 91.4 %   MCV 81.3  78.0 - 100.0 fL   MCH 26.0  26.0 - 34.0 pg   MCHC 32.0  30.0 - 36.0 g/dL   RDW 78.2  95.6 - 21.3 %   Platelets 315  150 - 400 K/uL  CREATININE, SERUM     Status: Abnormal   Collection Time    03/04/12  3:23 PM      Result Value Range   Creatinine, Ser  0.70  0.50 - 1.10 mg/dL   GFR calc non Af Amer 85 (*) >90 mL/min   GFR calc Af Amer >90  >90 mL/min   Comment:            The eGFR has been calculated     using the CKD EPI equation.     This calculation has not been     validated in all clinical     situations.     eGFR's persistently     <90 mL/min signify     possible Chronic Kidney Disease.  GLUCOSE, CAPILLARY     Status: Abnormal   Collection Time    03/04/12  4:22 PM      Result Value Range   Glucose-Capillary 211 (*) 70 - 99 mg/dL  GLUCOSE, CAPILLARY     Status: Abnormal   Collection Time    03/04/12  9:25 PM      Result Value Range   Glucose-Capillary 67 (*) 70 - 99 mg/dL  GLUCOSE, CAPILLARY     Status: Abnormal   Collection Time    03/04/12 10:11 PM      Result Value Range   Glucose-Capillary 60 (*) 70 - 99 mg/dL  GLUCOSE, CAPILLARY     Status: Abnormal   Collection Time    03/04/12 10:13 PM      Result Value  Range   Glucose-Capillary 64 (*) 70 - 99 mg/dL  GLUCOSE, CAPILLARY     Status: None   Collection Time    03/04/12 10:36 PM      Result Value Range   Glucose-Capillary 99  70 - 99 mg/dL  GLUCOSE, CAPILLARY     Status: Abnormal   Collection Time    03/05/12  7:47 AM      Result Value Range   Glucose-Capillary 120 (*) 70 - 99 mg/dL  GLUCOSE, CAPILLARY     Status: Abnormal   Collection Time    03/05/12 11:25 AM      Result Value Range   Glucose-Capillary 225 (*) 70 - 99 mg/dL  GLUCOSE, CAPILLARY     Status: None   Collection Time    03/05/12  4:35 PM      Result Value Range   Glucose-Capillary 80  70 - 99 mg/dL  GLUCOSE, CAPILLARY     Status: None   Collection Time    03/05/12  8:28 PM      Result Value Range   Glucose-Capillary 82  70 - 99 mg/dL   Comment 1 Notify RN    GLUCOSE, CAPILLARY     Status: Abnormal   Collection Time    03/06/12  7:33 AM      Result Value Range   Glucose-Capillary 145 (*) 70 - 99 mg/dL  GLUCOSE, CAPILLARY     Status: Abnormal   Collection Time    03/06/12 11:53 AM       Result Value Range   Glucose-Capillary 141 (*) 70 - 99 mg/dL  GLUCOSE, CAPILLARY     Status: Abnormal   Collection Time    03/06/12  5:09 PM      Result Value Range   Glucose-Capillary 61 (*) 70 - 99 mg/dL   Comment 1 Notify RN    GLUCOSE, CAPILLARY     Status: Abnormal   Collection Time    03/06/12  5:38 PM      Result Value Range   Glucose-Capillary 101 (*) 70 - 99 mg/dL  GLUCOSE, CAPILLARY     Status: Abnormal   Collection Time    03/06/12  8:38 PM      Result Value Range   Glucose-Capillary 178 (*) 70 - 99 mg/dL   Comment 1 Notify RN    GLUCOSE, CAPILLARY     Status: Abnormal   Collection Time    03/07/12  4:08 AM      Result Value Range   Glucose-Capillary 232 (*) 70 - 99 mg/dL   Comment 1 Notify RN    GLUCOSE, CAPILLARY     Status: Abnormal   Collection Time    03/07/12  7:20 AM      Result Value Range   Glucose-Capillary 191 (*) 70 - 99 mg/dL   Comment 1 Notify RN       HEENT: normal Cardio: RRR Resp: CTA B/L GI: BS positive Extremity:  No Edema Skin:   Wound C/D/I and Other Left heel normal Neuro: Alert/Oriented, Abnormal Sensory reduced sensation in finger tips and below ankle LLE, Normal Motor and Other L foot and bilateral hand intrinsic atrophy Musc/Skel:  Other R BKA stump well healed mild edema Gen: NAD   Assessment/Plan: 1. Functional deficits secondary to R supracondylar femur fracture in a pt with R BKA, NWB x 6 weeks which require 3+ hours per day of interdisciplinary therapy in a comprehensive inpatient rehab setting. Physiatrist is providing close team supervision and  24 hour management of active medical problems listed below. Physiatrist and rehab team continue to assess barriers to discharge/monitor patient progress toward functional and medical goals. FIM: FIM - Bathing Bathing Steps Patient Completed: Chest;Right Arm;Left Arm;Abdomen;Right upper leg;Left upper leg Bathing: 3: Mod-Patient completes 5-7 66f 10 parts or 50-74%  FIM -  Upper Body Dressing/Undressing Upper body dressing/undressing steps patient completed: Thread/unthread right bra strap;Thread/unthread left bra strap;Hook/unhook bra;Thread/unthread right sleeve of pullover shirt/dresss;Thread/unthread left sleeve of pullover shirt/dress Upper body dressing/undressing: 3: Mod-Patient completed 50-74% of tasks FIM - Lower Body Dressing/Undressing Lower body dressing/undressing: 1: Total-Patient completed less than 25% of tasks  FIM - Toileting Toileting: 0: Activity did not occur  FIM - Archivist Transfers: 0-Activity did not occur  FIM - Banker Devices: Therapist, occupational: 3: Chair or W/C > Bed: Mod A (lift or lower assist)  FIM - Locomotion: Wheelchair Distance: 50 feet with minA and cues for UE placement to facilitate propulsion Locomotion: Wheelchair: 1: Travels less than 50 ft with minimal assistance (Pt.>75%) FIM - Locomotion: Ambulation Ambulation/Gait Assistance:  (R BKA, NWB R LE) Locomotion: Ambulation: 0: Activity did not occur  Comprehension Comprehension Mode: Auditory Comprehension: 5-Follows basic conversation/direction: With extra time/assistive device  Expression Expression Mode: Verbal Expression: 5-Expresses basic needs/ideas: With no assist  Social Interaction Social Interaction: 6-Interacts appropriately with others with medication or extra time (anti-anxiety, antidepressant).  Problem Solving Problem Solving: 5-Solves basic 90% of the time/requires cueing < 10% of the time  Memory Memory: 4-Recognizes or recalls 75 - 89% of the time/requires cueing 10 - 24% of the time   Medical Problem List and Plan:  1. Right supracondylar femur fracture status post ORIF 03/01/2012  2. DVT Prophylaxis/Anticoagulation: Subcutaneous Lovenox. Monitor platelet counts any signs of bleeding  3. Pain Management: Hydrocodone and Robaxin as needed. Monitor with increased mobility.   4. Neuropsych: This patient is capable of making decisions on his/her own behalf.  5. Postoperative anemia. Latest hemoglobin 8.6. Followup CBC. Patient is asymptomatic  6. History right below-knee amputation 2010. Patient does have a prosthesis per [facility] [employer]  7. Diabetes mellitus with peripheral neuropathy. Hemoglobin A1c 8.0. Levemir 20 units daily and NovoLog 4 units 3 times a day. Check blood sugars a.c. and at bedtime. Titrate regimen as needed.  8. Hypertension. Clonidine 0.05 mg daily. Monitor with increased activity  9. Preadmission Klebsiella urine tract infection. Keflex initiated 03/02/2012. Patient denies dysuria  10. Hyperlipidemia. Zocor  LOS (Days) 3 A FACE TO FACE EVALUATION WAS PERFORMED  Kyleigh Nannini E 03/07/2012, 8:23 AM

## 2012-03-07 NOTE — Progress Notes (Signed)
Physical Therapy Session Note  Patient Details  Name: Judith [name] MRN: [id number] Date of Birth: [birth year]  Today's Date: 03/07/2012 Time: 4098-1191 Time Calculation (min): 30 min  Short Term Goals: Week 1:  PT Short Term Goal 1 (Week 1): Patient will be able to perform bed mobility with S/min-A (has hospital bed at home) PT Short Term Goal 2 (Week 1): Patient will be able to perform transfers with Mod-Assist  PT Short Term Goal 3 (Week 1): Patient will be able to perform wheelchair mobility and management x 150' with S/Independent assist.  Skilled Therapeutic Interventions/Progress Updates:   Patient reporting she would like to return to bed but RN would like patient attempt to void on toilet.  Obtained a drop arm BSC for patient and performed slideboard w/c > BSC with max-total A secondary to transfer being uphill.  Once on [facility] patient performed sit > squat with total A so that tech could doff pants and dirty brief.  While performing multiple sit > squats on BSC with total A patient was able to have BM.  Patient performed sit > stand from [facility] with +3 assistance and maintained standing with UE support on RW and +2 A while third person performed hygiene and donned pants.  Patient performed slideboard transfer w/c > bed with +3 A secondary to uphill transfer.  Patient to return to supine with nursing staff.   Therapy Documentation Precautions:  Precautions Precautions: Fall Precaution Comments: R BKA Restrictions Weight Bearing Restrictions: Yes RLE Weight Bearing: Non weight bearing Other Position/Activity Restrictions: NWB R LE Vital Signs: Therapy Vitals Temp: 97.9 F (36.6 C) Temp src: Oral Pulse Rate: 99 Resp: 18 BP: 138/82 mmHg Patient Position, if appropriate: Lying Oxygen Therapy SpO2: 96 % O2 Device: None (Room air) Pain: Pain Assessment Pain Assessment: No/denies pain   See FIM for current functional status  Therapy/Group: Individual Therapy  Edman Circle  [facility] 03/07/2012, 4:58 PM

## 2012-03-08 ENCOUNTER — Inpatient Hospital Stay (HOSPITAL_COMMUNITY): Payer: Medicare Other | Admitting: Occupational Therapy

## 2012-03-08 ENCOUNTER — Inpatient Hospital Stay (HOSPITAL_COMMUNITY): Payer: Medicare Other | Admitting: Physical Therapy

## 2012-03-08 ENCOUNTER — Inpatient Hospital Stay (HOSPITAL_COMMUNITY): Payer: Medicare Other | Admitting: *Deleted

## 2012-03-08 DIAGNOSIS — W19XXXA Unspecified fall, initial encounter: Secondary | ICD-10-CM

## 2012-03-08 DIAGNOSIS — S72453A Displaced supracondylar fracture without intracondylar extension of lower end of unspecified femur, initial encounter for closed fracture: Secondary | ICD-10-CM

## 2012-03-08 DIAGNOSIS — S88119A Complete traumatic amputation at level between knee and ankle, unspecified lower leg, initial encounter: Secondary | ICD-10-CM

## 2012-03-08 LAB — GLUCOSE, CAPILLARY
Glucose-Capillary: 193 mg/dL — ABNORMAL HIGH (ref 70–99)
Glucose-Capillary: 150 mg/dL — ABNORMAL HIGH (ref 70–99)

## 2012-03-08 MED ORDER — INSULIN DETEMIR 100 UNIT/ML ~~LOC~~ SOLN
25.0000 [IU] | Freq: Every day | SUBCUTANEOUS | Status: DC
  Administered 2012-03-08 – 2012-03-11 (×4): 25 [IU] via SUBCUTANEOUS

## 2012-03-08 MED ORDER — INSULIN DETEMIR 100 UNIT/ML ~~LOC~~ SOLN
25.0000 [IU] | Freq: Every day | SUBCUTANEOUS | Status: DC
  Filled 2012-03-08: qty 10

## 2012-03-08 NOTE — Progress Notes (Signed)
Occupational Therapy Session Note  Patient Details  Name: Judith [name] MRN: [id number] Date of Birth: 1940/06/06  Today's Date: 03/08/2012 Time: 2536-6440 Time Calculation (min): 46 min  Short Term Goals: Week 1:  OT Short Term Goal 1 (Week 1): Pt. wiill be stand for periarea care with mod assist OT Short Term Goal 2 (Week 1): Pt. will be mmoderate assist with LB bathing OT Short Term Goal 3 (Week 1): Pt. will be moderate assist with donning pants OT Short Term Goal 4 (Week 1): Pt. will transfer to [facility] with mod assist OT Short Term Goal 5 (Week 1): Pt. will recall sequence for going from sit to stand  Skilled Therapeutic Interventions/Progress Updates:    Pt seen for 1:1 OT session with focus on slide board transfers, weight shifting and trunk control in preparation for sit ->stand. Pt in bed upon arrival, completed bed mobility and sliding board transfer to [facility] with Mod A and VC for sequencing. Pt completed trunk control and UB strengthening exercises seated unsupported on mat using 2 lb weight bar to start, then upgraded to 4lb weight bar to increase challenge. Completed weight shifting exercises using lateral leans to WB on Rt and Lt elbow, and scooting Rt and Lt up and down mat with Min A and VC for sequencing and to facilitate forward leaning. Completed sliding board transfers from [facility] <> mat with Mod A and VC for sequencing and to correct backward leaning.  Therapy Documentation Precautions:  Precautions Precautions: Fall Precaution Comments: R BKA Restrictions Weight Bearing Restrictions: Yes RLE Weight Bearing: Non weight bearing Other Position/Activity Restrictions: NWB R LE    Pain: No pain reported this session     See FIM for current functional status  Therapy/Group: Individual Therapy  [name] 03/08/2012, 3:07 PM

## 2012-03-08 NOTE — Progress Notes (Signed)
Physical Therapy Session Note  Patient Details  Name: Judith [name] MRN: [id number] Date of Birth: 09-21-1940  Today's Date: 03/08/2012 Time: 1350-1450 Time Calculation (min): 60 min  Short Term Goals: Week 1:  PT Short Term Goal 1 (Week 1): Patient will be able to perform bed mobility with S/min-A (has hospital bed at home) PT Short Term Goal 2 (Week 1): Patient will be able to perform transfers with Mod-Assist  PT Short Term Goal 3 (Week 1): Patient will be able to perform wheelchair mobility and management x 150' with S/Independent assist.  Skilled Therapeutic Interventions/Progress Updates:   Patient just finished with OT; performed slideboard transfer uphill w/c > mat with max A with manual facilitation for full anterior lean and trunk rotation for use of head hips relationship to advance buttocks across board.  Once on mat performed sit > supine with min-mod A; in supine performed bilat LE strengthening/AROM exercises without resistance: 12 reps each: RLE open chain hip ABD in supine and sidelying, sidelying R hip extension, supine RLE hamstring curls and SAQ, LLE heel slides, SAQ with controlled lowering, bridging, and bilat hip IR and ADD.  Performed rolling to L side and side> sit with min-mod A overall.  Performed one sit > stand from elevated mat with UE support on back of w/c with max A and maintained static standing x 30 sec with min-mod A and verbal cues to maintain trunk and hip extension.  Performed slideboard downhill mat > w/c with mod A for anterior and lateral lean to advance into w/c.  Performed short distance of w/c mobility for UE strengthening and endurance x 25' with supervision but with extra time.    Therapy Documentation Precautions:  Precautions Precautions: Fall Precaution Comments: R BKA Restrictions Weight Bearing Restrictions: Yes RLE Weight Bearing: Non weight bearing Other Position/Activity Restrictions: NWB R LE Pain:  No c/o pain  See FIM for current  functional status  Therapy/Group: Individual Therapy  Edman Circle [place] 03/08/2012, 2:59 PM

## 2012-03-08 NOTE — Progress Notes (Signed)
Reviewed and agree with the attached treatment note.  Yahya Boldman, OTR/L 

## 2012-03-08 NOTE — Progress Notes (Signed)
Physical Therapy Session Note  Patient Details  Name: Judith [name] MRN: [id number] Date of Birth: 1940-02-19  Today's Date: 03/08/2012 Time: 1530-1600 Time Calculation (min): 30 min  Short Term Goals: Week 1:  PT Short Term Goal 1 (Week 1): Patient will be able to perform bed mobility with S/min-A (has hospital bed at home) PT Short Term Goal 2 (Week 1): Patient will be able to perform transfers with Mod-Assist  PT Short Term Goal 3 (Week 1): Patient will be able to perform wheelchair mobility and management x 150' with S/Independent assist.  Skilled Therapeutic Interventions/Progress Updates:    Patient received sitting in wheelchair with requests to use the bathroom. Patient performs stand pivot transfer wheelchair<>toilet with +2 person assist. Patient requires +2 assistance to stand to pull up pants with B UE on grab bars.  Patient instructed in wheelchair mobility x125' with supervision required for verbal cues for proper sequencing, technique, and safety.  Patient returned to room and left seated in wheelchair with all needs within reach.  Therapy Documentation Precautions:  Precautions Precautions: Fall Precaution Comments: R BKA Restrictions Weight Bearing Restrictions: Yes RLE Weight Bearing: Non weight bearing Other Position/Activity Restrictions: NWB R LE Pain: Pain Assessment Pain Assessment: No/denies pain Pain Score: 0-No pain Locomotion : Ambulation Ambulation/Gait Assistance: Not tested (comment) Wheelchair Mobility Distance: 125   See FIM for current functional status  Therapy/Group: Individual Therapy  Chipper Herb. Alberto Schoch, PT, DPT  03/08/2012, 4:35 PM

## 2012-03-08 NOTE — Progress Notes (Signed)
Patient ID: Judith [name], female   DOB: 03-13-1940, 72 y.o.   MRN: [id number] Subjective/Complaints: 72 y.o. right-handed female with history of insulin-dependent diabetes mellitus as well as right below-knee amputation 2010 and uses a prosthesis provided by [employer] as well as rolling walker. Patient is a resident of [facility]. Admitted 02/29/2012 after a recent fall at home without loss of consciousness. She was taken to the emergency room with findings of mildly displaced fracture of the distal femur placed in a splint maintain nonweightbearing status and discharged home. She presented to the orthopedic office for followup x-rays repeated showing a displaced right distal femoral fracture. Underwent ORIF 03/01/2012 per Dr. Carola Frost. She remains nonweightbearing x6 weeks  Sleeping ok No pain in R thigh Review of Systems  Constitutional: Negative for fever and chills.  Respiratory: Negative for cough and shortness of breath.   Gastrointestinal: Negative for diarrhea and constipation.  Genitourinary: Negative for dysuria.  All other systems reviewed and are negative.    Objective: Vital Signs: Blood pressure 145/72, pulse 96, temperature 98.3 F (36.8 C), temperature source Oral, resp. rate 17, height 5\' 4"  (1.626 m), weight 90.357 kg (199 lb 3.2 oz), SpO2 96.00%. No results found. Results for orders placed during the hospital encounter of 03/04/12 (from the past 72 hour(s))  GLUCOSE, CAPILLARY     Status: Abnormal   Collection Time    03/05/12 11:25 AM      Result Value Range   Glucose-Capillary 225 (*) 70 - 99 mg/dL  GLUCOSE, CAPILLARY     Status: None   Collection Time    03/05/12  4:35 PM      Result Value Range   Glucose-Capillary 80  70 - 99 mg/dL  GLUCOSE, CAPILLARY     Status: None   Collection Time    03/05/12  8:28 PM      Result Value Range   Glucose-Capillary 82  70 - 99 mg/dL   Comment 1 Notify RN    GLUCOSE, CAPILLARY     Status:  Abnormal   Collection Time    03/06/12  7:33 AM      Result Value Range   Glucose-Capillary 145 (*) 70 - 99 mg/dL  GLUCOSE, CAPILLARY     Status: Abnormal   Collection Time    03/06/12 11:53 AM      Result Value Range   Glucose-Capillary 141 (*) 70 - 99 mg/dL  GLUCOSE, CAPILLARY     Status: Abnormal   Collection Time    03/06/12  5:09 PM      Result Value Range   Glucose-Capillary 61 (*) 70 - 99 mg/dL   Comment 1 Notify RN    GLUCOSE, CAPILLARY     Status: Abnormal   Collection Time    03/06/12  5:38 PM      Result Value Range   Glucose-Capillary 101 (*) 70 - 99 mg/dL  GLUCOSE, CAPILLARY     Status: Abnormal   Collection Time    03/06/12  8:38 PM      Result Value Range   Glucose-Capillary 178 (*) 70 - 99 mg/dL   Comment 1 Notify RN    GLUCOSE, CAPILLARY     Status: Abnormal   Collection Time    03/07/12  4:08 AM      Result Value Range   Glucose-Capillary 232 (*) 70 - 99 mg/dL   Comment 1 Notify RN    GLUCOSE, CAPILLARY     Status: Abnormal  Collection Time    03/07/12  7:20 AM      Result Value Range   Glucose-Capillary 191 (*) 70 - 99 mg/dL   Comment 1 Notify RN    CBC WITH DIFFERENTIAL     Status: Abnormal   Collection Time    03/07/12  7:50 AM      Result Value Range   WBC 7.4  4.0 - 10.5 K/uL   RBC 3.49 (*) 3.87 - 5.11 MIL/uL   Hemoglobin 9.0 (*) 12.0 - 15.0 g/dL   HCT 16.1 (*) 09.6 - 04.5 %   MCV 82.2  78.0 - 100.0 fL   MCH 25.8 (*) 26.0 - 34.0 pg   MCHC 31.4  30.0 - 36.0 g/dL   RDW 40.9  81.1 - 91.4 %   Platelets 386  150 - 400 K/uL   Neutrophils Relative 65  43 - 77 %   Neutro Abs 4.8  1.7 - 7.7 K/uL   Lymphocytes Relative 23  12 - 46 %   Lymphs Abs 1.7  0.7 - 4.0 K/uL   Monocytes Relative 6  3 - 12 %   Monocytes Absolute 0.5  0.1 - 1.0 K/uL   Eosinophils Relative 5  0 - 5 %   Eosinophils Absolute 0.4  0.0 - 0.7 K/uL   Basophils Relative 1  0 - 1 %   Basophils Absolute 0.1  0.0 - 0.1 K/uL  COMPREHENSIVE METABOLIC PANEL     Status: Abnormal    Collection Time    03/07/12  7:50 AM      Result Value Range   Sodium 138  135 - 145 mEq/L   Potassium 4.1  3.5 - 5.1 mEq/L   Chloride 100  96 - 112 mEq/L   CO2 28  19 - 32 mEq/L   Glucose, Bld 219 (*) 70 - 99 mg/dL   BUN 12  6 - 23 mg/dL   Creatinine, Ser 7.82  0.50 - 1.10 mg/dL   Calcium 9.0  8.4 - 95.6 mg/dL   Total Protein 6.6  6.0 - 8.3 g/dL   Albumin 2.5 (*) 3.5 - 5.2 g/dL   AST 13  0 - 37 U/L   ALT 7  0 - 35 U/L   Alkaline Phosphatase 97  39 - 117 U/L   Total Bilirubin 0.3  0.3 - 1.2 mg/dL   GFR calc non Af Amer 86 (*) >90 mL/min   GFR calc Af Amer >90  >90 mL/min   Comment:            The eGFR has been calculated     using the CKD EPI equation.     This calculation has not been     validated in all clinical     situations.     eGFR's persistently     <90 mL/min signify     possible Chronic Kidney Disease.  GLUCOSE, CAPILLARY     Status: Abnormal   Collection Time    03/07/12 12:09 PM      Result Value Range   Glucose-Capillary 221 (*) 70 - 99 mg/dL   Comment 1 Notify RN    GLUCOSE, CAPILLARY     Status: Abnormal   Collection Time    03/07/12  4:55 PM      Result Value Range   Glucose-Capillary 44 (*) 70 - 99 mg/dL   Comment 1 Notify RN    GLUCOSE, CAPILLARY     Status: Abnormal   Collection Time  03/07/12  5:10 PM      Result Value Range   Glucose-Capillary 43 (*) 70 - 99 mg/dL   Comment 1 Notify RN    GLUCOSE, CAPILLARY     Status: None   Collection Time    03/07/12  5:26 PM      Result Value Range   Glucose-Capillary 78  70 - 99 mg/dL   Comment 1 Notify RN    GLUCOSE, CAPILLARY     Status: Abnormal   Collection Time    03/07/12  5:58 PM      Result Value Range   Glucose-Capillary 150 (*) 70 - 99 mg/dL   Comment 1 Notify RN    GLUCOSE, CAPILLARY     Status: Abnormal   Collection Time    03/07/12  9:24 PM      Result Value Range   Glucose-Capillary 182 (*) 70 - 99 mg/dL  GLUCOSE, CAPILLARY     Status: Abnormal   Collection Time    03/08/12   7:29 AM      Result Value Range   Glucose-Capillary 246 (*) 70 - 99 mg/dL   Comment 1 Notify RN       HEENT: normal Cardio: RRR Resp: CTA B/L GI: BS positive Extremity:  No Edema Skin:   Wound C/D/I and Other Left heel normal Neuro: Alert/Oriented, Abnormal Sensory reduced sensation in finger tips and below ankle LLE, Normal Motor and Other L foot and bilateral hand intrinsic atrophy Musc/Skel:  Other R BKA stump well healed mild edema Gen: NAD   Assessment/Plan: 1. Functional deficits secondary to R supracondylar femur fracture in a pt with R BKA, NWB x 6 weeks which require 3+ hours per day of interdisciplinary therapy in a comprehensive inpatient rehab setting. Physiatrist is providing close team supervision and 24 hour management of active medical problems listed below. Physiatrist and rehab team continue to assess barriers to discharge/monitor patient progress toward functional and medical goals. FIM: FIM - Bathing Bathing Steps Patient Completed: Chest;Right Arm;Left Arm;Abdomen;Right upper leg;Left upper leg Bathing: 3: Mod-Patient completes 5-7 4f 10 parts or 50-74%  FIM - Upper Body Dressing/Undressing Upper body dressing/undressing steps patient completed: Thread/unthread right sleeve of pullover shirt/dresss;Thread/unthread left sleeve of pullover shirt/dress;Put head through opening of pull over shirt/dress;Pull shirt over trunk Upper body dressing/undressing: 5: Set-up assist to: Obtain clothing/put away FIM - Lower Body Dressing/Undressing Lower body dressing/undressing steps patient completed: Thread/unthread right pants leg;Thread/unthread left pants leg Lower body dressing/undressing: 2: Max-Patient completed 25-49% of tasks  FIM - Toileting Toileting: 1: Two helpers  FIM - Diplomatic Services operational officer Devices: Human resources officer Transfers: 1-Two helpers  FIM - Financial planner Transfer: 1: Two helpers  FIM - Locomotion: Wheelchair Distance: 55 Locomotion: Wheelchair: 0: Activity did not occur FIM - Locomotion: Ambulation Ambulation/Gait Assistance: Not tested (comment) Locomotion: Ambulation: 0: Activity did not occur  Comprehension Comprehension Mode: Auditory Comprehension: 5-Follows basic conversation/direction: With no assist  Expression Expression Mode: Verbal Expression: 5-Expresses basic needs/ideas: With no assist  Social Interaction Social Interaction: 6-Interacts appropriately with others with medication or extra time (anti-anxiety, antidepressant).  Problem Solving Problem Solving: 5-Solves basic 90% of the time/requires cueing < 10% of the time  Memory Memory: 4-Recognizes or recalls 75 - 89% of the time/requires cueing 10 - 24% of the time   Medical Problem List and Plan:  1. Right supracondylar femur fracture status post ORIF 03/01/2012  2. DVT Prophylaxis/Anticoagulation: Subcutaneous  Lovenox. Monitor platelet counts any signs of bleeding  3. Pain Management: Hydrocodone and Robaxin as needed. Monitor with increased mobility.  4. Neuropsych: This patient is capable of making decisions on his/her own behalf.  5. Postoperative anemia. Latest hemoglobin 8.6. Followup CBC. Patient is asymptomatic  6. History right below-knee amputation 2010. Patient does have a prosthesis per [employer]  7. Diabetes mellitus with peripheral neuropathy. uncontrolledHemoglobin A1c 8.0.Increase Levemir 25 Units for high am CBG ,D/C NovoLog 4 units 3 times a day. Check blood sugars a.c. and at bedtime. Titrate regimen as needed. Consider restart glucotrol 8. Hypertension. Clonidine 0.05 mg daily. Monitor with increased activity  9. Preadmission Klebsiella urine tract infection. Keflex initiated 03/02/2012. Patient denies dysuria  10. Hyperlipidemia. Zocor  LOS (Days) 4 A FACE TO FACE EVALUATION WAS PERFORMED  Ladona Rosten  E 03/08/2012, 8:02 AM

## 2012-03-08 NOTE — Progress Notes (Signed)
Occupational Therapy Session Note  Patient Details  Name: Judith [name] MRN: [id number] Date of Birth: 13-Mar-1940  Today's Date: 03/08/2012 Time: 9629-5284 Time Calculation (min): 58 min  Short Term Goals: Week 1:  OT Short Term Goal 1 (Week 1): Pt. wiill be stand for periarea care with mod assist OT Short Term Goal 2 (Week 1): Pt. will be mmoderate assist with LB bathing OT Short Term Goal 3 (Week 1): Pt. will be moderate assist with donning pants OT Short Term Goal 4 (Week 1): Pt. will transfer to [facility] with mod assist OT Short Term Goal 5 (Week 1): Pt. will recall sequence for going from sit to stand  Skilled Therapeutic Interventions/Progress Updates:    Pt seen for ADL retraining with focus on bed mobility, weight shifting, transfers, and sit <> stand with education to pt and granddaughter to increase participation and safety with self-care tasks of bathing and dressing.  Pt in bed upon arrival with incontinent urine in brief, engaged in rolling in bed to clean peri area prior to transfer to w/c.  Pt supervision with rolling, requiring physical assistance sidelying to supine.  Engaged in weight shifting down to elbows alternating Rt and Lt prior to slide board transfer.  Max assist slide board transfer to w/c with +2, second person for safety.  Engaged in LB bathing and dressing with education on alternative techniques to increase independence with donning Lt sock and shoe.  Use of chair to rest Lt foot on to increase ability to reach foot.  Engaged in sit <> stand x4 with max assist +2 and cues for hand placement.  Pt with increased carryover of hand placement with each sit <> stand.  Pt requested to return to bed, slide board transfer with +2 physical assist secondary to uphill angle.  Pt's granddaughter observed entire session.  Therapy Documentation Precautions:  Precautions Precautions: Fall Precaution Comments: R BKA Restrictions Weight Bearing Restrictions: Yes RLE Weight Bearing:  Non weight bearing Other Position/Activity Restrictions: NWB R LE Pain:  Pt with no c/o pain this session.  See FIM for current functional status  Therapy/Group: Individual Therapy  Leonette Monarch 03/08/2012, 11:00 AM

## 2012-03-09 ENCOUNTER — Inpatient Hospital Stay (HOSPITAL_COMMUNITY): Payer: Medicare Other | Admitting: Occupational Therapy

## 2012-03-09 ENCOUNTER — Inpatient Hospital Stay (HOSPITAL_COMMUNITY): Payer: Medicare Other | Admitting: Physical Therapy

## 2012-03-09 ENCOUNTER — Inpatient Hospital Stay (HOSPITAL_COMMUNITY): Payer: Medicare Other | Admitting: *Deleted

## 2012-03-09 LAB — GLUCOSE, CAPILLARY
Glucose-Capillary: 132 mg/dL — ABNORMAL HIGH (ref 70–99)
Glucose-Capillary: 262 mg/dL — ABNORMAL HIGH (ref 70–99)
Glucose-Capillary: 38 mg/dL — CL (ref 70–99)
Glucose-Capillary: 39 mg/dL — CL (ref 70–99)
Glucose-Capillary: 88 mg/dL (ref 70–99)
Glucose-Capillary: 166 mg/dL — ABNORMAL HIGH (ref 70–99)

## 2012-03-09 MED ORDER — GLUCOSE 40 % PO GEL
1.0000 | Freq: Once | ORAL | Status: AC
  Administered 2012-03-09 – 2012-03-11 (×3): 37.5 g via ORAL
  Filled 2012-03-09: qty 1.65

## 2012-03-09 MED ORDER — GLUCOSE 40 % PO GEL
ORAL | Status: AC
  Filled 2012-03-09: qty 1

## 2012-03-09 NOTE — Progress Notes (Signed)
Reviewed and agree with the attached treatment note.  Johm Pfannenstiel, OTR/L 

## 2012-03-09 NOTE — Progress Notes (Signed)
INITIAL NUTRITION ASSESSMENT  DOCUMENTATION CODES Per approved criteria  -Obesity Unspecified   INTERVENTION: 1.  Continue Glucerna Shake po daily, each supplement provides 220 kcal and 10 grams of protein, if intake of meals declines. 2. RD to continue to follow nutrition care plan  NUTRITION DIAGNOSIS: Increased nutrient needs related to post-op healing as evidenced by estimated needs.   Goal: Pt to meet >/= 90% of their estimated nutrition needs.  Monitor:  weight trends, lab trends, I/O's, PO intake, supplement tolerance  Reason for Assessment: MD Consult for Nutrition Assessment  72 y.o. female  Admitting Dx: R leg pain  ASSESSMENT: S/p BKA in 2010. Admitted for surgery for her supracondylar/intracondylar femoral fracture s/p fall. Pt was seen by RD staff during acute hospitalization. Underwent ORIF 2/4.  Pt states that her appetite and weight were stable PTA. Intake variable, but mostly 75 - 100%.  Continues with order for Glucerna Shake po daily. She states that she enjoys these shake and would like to continue them. Encouraged protein-rich foods on meal trays, pt verbalized understanding.  Height: Ht Readings from Last 1 Encounters:  03/04/12 5\' 4"  (1.626 m)   Weight: Wt Readings from Last 1 Encounters:  03/04/12 199 lb 3.2 oz (90.357 kg)   Ideal Body Weight: 113 lb (adjusted for BKA)  % Ideal Body Weight: 176%  Wt Readings from Last 10 Encounters:  03/04/12 199 lb 3.2 oz (90.357 kg)  02/29/12 195 lb (88.451 kg)  02/29/12 195 lb (88.451 kg)    Usual Body Weight: 190 - 195 lb (per pt)  % Usual Body Weight: 103%  BMI:  36.3 - using adjusted wt for BKA; Obese Class II  Estimated Nutritional Needs: Kcal: 1600 - 1700 kcal Protein: 80 - 90 grams Fluid: 1.8 - 2 liters daily  Diet Order: Carb Control Medium  EDUCATION NEEDS: -No education needs identified at this time   Intake/Output Summary (Last 24 hours) at 03/09/12 1125 Last data filed at  03/09/12 0800  Gross per 24 hour  Intake   1040 ml  Output      0 ml  Net   1040 ml    Last BM: 2/11  Labs:   Recent Labs Lab 03/03/12 0620 03/04/12 1523 03/07/12 0750  NA 138  --  138  K 3.9  --  4.1  CL 100  --  100  CO2 27  --  28  BUN 14  --  12  CREATININE 0.82 0.70 0.66  CALCIUM 8.9  --  9.0  GLUCOSE 205*  --  219*    CBG (last 3)   Recent Labs  03/08/12 2046 03/09/12 0728 03/09/12 1049  GLUCAP 150* 132* 262*    Scheduled Meds: . cholecalciferol  1,000 Units Oral Daily  . cloNIDine  0.05 mg Oral Daily  . enoxaparin (LOVENOX) injection  40 mg Subcutaneous Q24H  . feeding supplement  237 mL Oral Daily  . insulin aspart  0-20 Units Subcutaneous TID WC  . insulin detemir  25 Units Subcutaneous Daily  . multivitamin with minerals  1 tablet Oral Daily  . pantoprazole  40 mg Oral Daily  . simvastatin  40 mg Oral QPM    Continuous Infusions:    Past Medical History  Diagnosis Date  . Hypertension   . Insulin dependent diabetes mellitus 03/01/2012  . 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 Closed right ankle fracture   . Hyperlipidemia   . Fracture of femur, distal, right, closed 03/01/2012  .  Obesity (BMI 30-39.9) 03/03/2012    Past Surgical History  Procedure Laterality Date  . Below knee leg amputation      Right  . Orif femur fracture  03/01/2012    Procedure: OPEN REDUCTION INTERNAL FIXATION (ORIF) DISTAL FEMUR FRACTURE;  Surgeon: Budd Palmer, MD;  Location: [facility] OR;  Service: Orthopedics;  Laterality: Right;  ORIF right femur  . Orif femur fracture  03/03/2012    Dr Carola Frost   right leg    Jarold Motto MS, RD, LDN Pager: [phone] After-hours pager: [phone]

## 2012-03-09 NOTE — Progress Notes (Signed)
Hypoglycemic Event  CBG: 38  Treatment: 15 GM carbohydrate snack  Symptoms: None  Follow-up CBG: Time:1705 CBG Result: with one meter 35 and recheck with another meter 39  Possible Reasons for Event: Unknown  Comments/MD notified: N/A, gave one tube of glucose gel and patient eating dinner.no symptoms noted    Roberts-VonCannon, Ashritha Desrosiers Elon Jester  Remember to initiate Hypoglycemia Order Set & complete

## 2012-03-09 NOTE — Patient Care Conference (Signed)
Inpatient RehabilitationTeam Conference and Plan of Care Update Date: 03/09/2012   Time: 10:45 AM    Patient Name: Judith [name]      Medical Record Number: [id number]  Date of Birth: 10/25/1940 Sex: Female         Room/Bed: 4038/4038-01 Payor Info: Payor: MEDICARE  Plan: MEDICARE PART A AND B  Product Type: *No Product type*     Admitting Diagnosis: rt femur fx  Admit Date/Time:  03/04/2012  2:40 PM Admission Comments: No comment available   Primary Diagnosis:  Fracture of femur, distal, right, closed Principal Problem: Fracture of femur, distal, right, closed  Patient Active Problem List   Diagnosis Date Noted  . Acute blood loss anemia 03/03/2012  . Hx of right BKA 03/03/2012  . Fall 03/03/2012  . Obesity (BMI 30-39.9) 03/03/2012  . Poor nutrition 03/03/2012  . Fracture of femur, distal, right, closed 03/01/2012  . UTI (lower urinary tract infection) 03/01/2012  . Insulin dependent diabetes mellitus 03/01/2012  . Hypertension     Expected Discharge Date: Expected Discharge Date: 03/23/12  Team Members Present: Physician leading conference: Dr. Claudette Laws Social Worker Present: Dossie Der, LCSW Nurse Present: Gregor Hams, RN PT Present: Edman Circle, PT;Caroline Adriana Simas, PT;Other (comment) Clarisse Gouge ripa-PT) OT Present: Bretta Bang, Verlene Mayer, OT SLP Present: Fae Pippin, SLP Other (Discipline and Name): Charolette Child Coordinator     Current Status/Progress Goal Weekly Team Focus  Medical   Diabetic control improving, pain is adequately controlled  Diabetes control with CBGs less than 180 after meals and less than 140 fasting  Adjust diabetic medications, consider restarting oral medications   Bowel/Bladder   can be continent and incontinent of bowel and bladder, depends in use, patient not aware when has had accident  continent of bowel and bladder with toileting  up to BSC/BR   Swallow/Nutrition/ Hydration   Carb modified medicum         ADL's   supervision UB dressing, +2 LB dressing, mod-max assist slide board transfer to level surface, max assist sit <> stand and +2 for clothing management with dressing/toileting  supervision UB dressing, min assist LB dressing, min assist toileting and toilet transfer  transfers, sit <> stand, and family education   Mobility   max-total A overall w/c level, slideboard and static standing  min A w/c level  activity tolerance, endurance, transfers, UE and LE strength, balance and trunk control   Communication             Safety/Cognition/ Behavioral Observations            Pain   no complaint of pain  3 or less on scasle of 1-10      Skin   right hip incision intact with sutures and mepilex dressing with ioban  no  skin breakdown         *See Interdisciplinary Assessment and Plan and progress notes for long and short-term goals  Barriers to Discharge: Right hip Fracture and BKA, severe diabetic peripheral neuropathy affecting the left side    Possible Resolutions to Barriers:  Further rehabilitation training assess wheelchair skills versus walker skills    Discharge Planning/Teaching Needs:  Home with CAP worker and granddaughters to assist-aware pt will require 24 hr care at discharge      Team Discussion:  Ulcer left leg watching-slide board transfer's, making small gain but will need physcial assist at discharge.  Need to begin family education early due to much to do for pt.  Revisions  to Treatment Plan:  None   Continued Need for Acute Rehabilitation Level of Care: The patient requires daily medical management by a physician with specialized training in physical medicine and rehabilitation for the following conditions: Daily direction of a multidisciplinary physical rehabilitation program to ensure safe treatment while eliciting the highest outcome that is of practical value to the patient.: Yes Daily medical management of patient stability for increased activity during  participation in an intensive rehabilitation regime.: Yes Daily analysis of laboratory values and/or radiology reports with any subsequent need for medication adjustment of medical intervention for : Neurological problems;Other  Marjorie Deprey, Lemar Livings 03/11/2012, 9:20 AM

## 2012-03-09 NOTE — Progress Notes (Signed)
Patient ID: Judith [name], female   DOB: 08-Feb-1940, 72 y.o.   MRN: [id number] Subjective/Complaints: 72 y.o. right-handed female with history of insulin-dependent diabetes mellitus as well as right below-knee amputation 2010 and uses a prosthesis provided by [employer] as well as rolling walker. Patient is a resident of [facility]. Admitted 02/29/2012 after a recent fall at home without loss of consciousness. She was taken to the emergency room with findings of mildly displaced fracture of the distal femur placed in a splint maintain nonweightbearing status and discharged home. She presented to the orthopedic office for followup x-rays repeated showing a displaced right distal femoral fracture. Underwent ORIF 03/01/2012 per Dr. Carola Frost. She remains nonweightbearing x6 weeks  Sleeping ok No problems with WC propulsion CBG 132 this am Review of Systems  Constitutional: Negative for fever and chills.  Respiratory: Negative for cough and shortness of breath.   Gastrointestinal: Negative for diarrhea and constipation.  Genitourinary: Negative for dysuria.  All other systems reviewed and are negative.    Objective: Vital Signs: Blood pressure 165/79, pulse 82, temperature 98.2 F (36.8 C), temperature source Oral, resp. rate 18, height 5\' 4"  (1.626 m), weight 90.357 kg (199 lb 3.2 oz), SpO2 98.00%. No results found. Results for orders placed during the hospital encounter of 03/04/12 (from the past 72 hour(s))  GLUCOSE, CAPILLARY     Status: Abnormal   Collection Time    03/06/12 11:53 AM      Result Value Range   Glucose-Capillary 141 (*) 70 - 99 mg/dL  GLUCOSE, CAPILLARY     Status: Abnormal   Collection Time    03/06/12  5:09 PM      Result Value Range   Glucose-Capillary 61 (*) 70 - 99 mg/dL   Comment 1 Notify RN    GLUCOSE, CAPILLARY     Status: Abnormal   Collection Time    03/06/12  5:38 PM      Result Value Range   Glucose-Capillary 101 (*) 70 - 99  mg/dL  GLUCOSE, CAPILLARY     Status: Abnormal   Collection Time    03/06/12  8:38 PM      Result Value Range   Glucose-Capillary 178 (*) 70 - 99 mg/dL   Comment 1 Notify RN    GLUCOSE, CAPILLARY     Status: Abnormal   Collection Time    03/07/12  4:08 AM      Result Value Range   Glucose-Capillary 232 (*) 70 - 99 mg/dL   Comment 1 Notify RN    GLUCOSE, CAPILLARY     Status: Abnormal   Collection Time    03/07/12  7:20 AM      Result Value Range   Glucose-Capillary 191 (*) 70 - 99 mg/dL   Comment 1 Notify RN    CBC WITH DIFFERENTIAL     Status: Abnormal   Collection Time    03/07/12  7:50 AM      Result Value Range   WBC 7.4  4.0 - 10.5 K/uL   RBC 3.49 (*) 3.87 - 5.11 MIL/uL   Hemoglobin 9.0 (*) 12.0 - 15.0 g/dL   HCT 40.9 (*) 81.1 - 91.4 %   MCV 82.2  78.0 - 100.0 fL   MCH 25.8 (*) 26.0 - 34.0 pg   MCHC 31.4  30.0 - 36.0 g/dL   RDW 78.2  95.6 - 21.3 %   Platelets 386  150 - 400 K/uL   Neutrophils Relative 65  43 -  77 %   Neutro Abs 4.8  1.7 - 7.7 K/uL   Lymphocytes Relative 23  12 - 46 %   Lymphs Abs 1.7  0.7 - 4.0 K/uL   Monocytes Relative 6  3 - 12 %   Monocytes Absolute 0.5  0.1 - 1.0 K/uL   Eosinophils Relative 5  0 - 5 %   Eosinophils Absolute 0.4  0.0 - 0.7 K/uL   Basophils Relative 1  0 - 1 %   Basophils Absolute 0.1  0.0 - 0.1 K/uL  COMPREHENSIVE METABOLIC PANEL     Status: Abnormal   Collection Time    03/07/12  7:50 AM      Result Value Range   Sodium 138  135 - 145 mEq/L   Potassium 4.1  3.5 - 5.1 mEq/L   Chloride 100  96 - 112 mEq/L   CO2 28  19 - 32 mEq/L   Glucose, Bld 219 (*) 70 - 99 mg/dL   BUN 12  6 - 23 mg/dL   Creatinine, Ser 9.62  0.50 - 1.10 mg/dL   Calcium 9.0  8.4 - 95.2 mg/dL   Total Protein 6.6  6.0 - 8.3 g/dL   Albumin 2.5 (*) 3.5 - 5.2 g/dL   AST 13  0 - 37 U/L   ALT 7  0 - 35 U/L   Alkaline Phosphatase 97  39 - 117 U/L   Total Bilirubin 0.3  0.3 - 1.2 mg/dL   GFR calc non Af Amer 86 (*) >90 mL/min   GFR calc Af Amer >90  >90  mL/min   Comment:            The eGFR has been calculated     using the CKD EPI equation.     This calculation has not been     validated in all clinical     situations.     eGFR's persistently     <90 mL/min signify     possible Chronic Kidney Disease.  GLUCOSE, CAPILLARY     Status: Abnormal   Collection Time    03/07/12 12:09 PM      Result Value Range   Glucose-Capillary 221 (*) 70 - 99 mg/dL   Comment 1 Notify RN    GLUCOSE, CAPILLARY     Status: Abnormal   Collection Time    03/07/12  4:55 PM      Result Value Range   Glucose-Capillary 44 (*) 70 - 99 mg/dL   Comment 1 Notify RN    GLUCOSE, CAPILLARY     Status: Abnormal   Collection Time    03/07/12  5:10 PM      Result Value Range   Glucose-Capillary 43 (*) 70 - 99 mg/dL   Comment 1 Notify RN    GLUCOSE, CAPILLARY     Status: None   Collection Time    03/07/12  5:26 PM      Result Value Range   Glucose-Capillary 78  70 - 99 mg/dL   Comment 1 Notify RN    GLUCOSE, CAPILLARY     Status: Abnormal   Collection Time    03/07/12  5:58 PM      Result Value Range   Glucose-Capillary 150 (*) 70 - 99 mg/dL   Comment 1 Notify RN    GLUCOSE, CAPILLARY     Status: Abnormal   Collection Time    03/07/12  9:24 PM      Result Value Range   Glucose-Capillary  182 (*) 70 - 99 mg/dL  GLUCOSE, CAPILLARY     Status: Abnormal   Collection Time    03/08/12  7:29 AM      Result Value Range   Glucose-Capillary 246 (*) 70 - 99 mg/dL   Comment 1 Notify RN    GLUCOSE, CAPILLARY     Status: Abnormal   Collection Time    03/08/12 12:08 PM      Result Value Range   Glucose-Capillary 193 (*) 70 - 99 mg/dL   Comment 1 Notify RN    GLUCOSE, CAPILLARY     Status: None   Collection Time    03/08/12  5:17 PM      Result Value Range   Glucose-Capillary 79  70 - 99 mg/dL   Comment 1 Notify RN    GLUCOSE, CAPILLARY     Status: Abnormal   Collection Time    03/08/12  8:46 PM      Result Value Range   Glucose-Capillary 150 (*) 70 - 99  mg/dL   Comment 1 Notify RN       HEENT: normal Cardio: RRR Resp: CTA B/L GI: BS positive Extremity:  No Edema Skin:   Wound C/D/I and Other Left heel normal Neuro: Alert/Oriented, Abnormal Sensory reduced sensation in finger tips and below ankle LLE, Normal Motor and Other L foot and bilateral hand intrinsic atrophy Musc/Skel:  Other R BKA stump well healed mild edema Gen: NAD   Assessment/Plan: 1. Functional deficits secondary to R supracondylar femur fracture in a pt with R BKA, NWB x 6 weeks which require 3+ hours per day of interdisciplinary therapy in a comprehensive inpatient rehab setting. Physiatrist is providing close team supervision and 24 hour management of active medical problems listed below. Physiatrist and rehab team continue to assess barriers to discharge/monitor patient progress toward functional and medical goals. FIM: FIM - Bathing Bathing Steps Patient Completed: Chest;Right Arm;Left Arm;Abdomen;Front perineal area;Right upper leg;Left upper leg Bathing: 3: Mod-Patient completes 5-7 75f 10 parts or 50-74%  FIM - Upper Body Dressing/Undressing Upper body dressing/undressing steps patient completed: Thread/unthread right bra strap;Thread/unthread left bra strap;Hook/unhook bra;Thread/unthread right sleeve of pullover shirt/dresss;Thread/unthread left sleeve of pullover shirt/dress;Put head through opening of pull over shirt/dress;Pull shirt over trunk Upper body dressing/undressing: 5: Set-up assist to: Obtain clothing/put away FIM - Lower Body Dressing/Undressing Lower body dressing/undressing steps patient completed: Thread/unthread right pants leg;Thread/unthread left pants leg;Don/Doff left sock;Don/Doff left shoe Lower body dressing/undressing: 2: Max-Patient completed 25-49% of tasks  FIM - Toileting Toileting: 1: Two helpers  FIM - Diplomatic Services operational officer Devices: Human resources officer Transfers: 1-Two helpers  FIM -  Banker Devices: Arm rests Bed/Chair Transfer: 1: Two helpers  FIM - Locomotion: Wheelchair Distance: 125 Locomotion: Wheelchair: 2: Travels 50 - 149 ft with supervision, cueing or coaxing FIM - Locomotion: Ambulation Ambulation/Gait Assistance: Not tested (comment) Locomotion: Ambulation: 0: Activity did not occur  Comprehension Comprehension Mode: Auditory Comprehension: 5-Follows basic conversation/direction: With no assist  Expression Expression Mode: Verbal Expression: 5-Expresses basic needs/ideas: With no assist  Social Interaction Social Interaction: 6-Interacts appropriately with others with medication or extra time (anti-anxiety, antidepressant).  Problem Solving Problem Solving: 5-Solves basic 90% of the time/requires cueing < 10% of the time  Memory Memory: 4-Recognizes or recalls 75 - 89% of the time/requires cueing 10 - 24% of the time   Medical Problem List and Plan:  1. Right supracondylar femur fracture status post ORIF 03/01/2012  2.  DVT Prophylaxis/Anticoagulation: Subcutaneous Lovenox. Monitor platelet counts any signs of bleeding  3. Pain Management: Hydrocodone and Robaxin as needed. Monitor with increased mobility.  4. Neuropsych: This patient is capable of making decisions on his/her own behalf.  5. Postoperative anemia. Latest hemoglobin 8.6. Followup CBC. Patient is asymptomatic  6. History right below-knee amputation 2010. Patient does have a prosthesis per [facility]  7. Diabetes mellitus with peripheral neuropathy. uncontrolledHemoglobin A1c 8.0.Increase Levemir 25 Units for high am CBG ,D/C NovoLog 4 units 3 times a day. Check blood sugars a.c. and at bedtime. Titrate regimen as needed. Consider restart glucotrol.  Am CBG improved 8. Hypertension. Clonidine 0.05 mg daily. Monitor with increased activity  9. Preadmission Klebsiella urine tract infection. Keflex initiated 03/02/2012. Patient denies  dysuria  10. Hyperlipidemia. Zocor  LOS (Days) 5 A FACE TO FACE EVALUATION WAS PERFORMED  KIRSTEINS,ANDREW E 03/09/2012, 9:20 AM

## 2012-03-09 NOTE — Significant Event (Signed)
Hypoglycemic Event  CBG: 39  Treatment: 15 GM gel as per prior note, pt has received one tube of gel  Symptoms: None  Follow-up CBG: Time:1720 CBG Result:88  Possible Reasons for Event: Unknown  Comments/MD notified:N/A, remains symptom free    Roberts-VonCannon, Kariana Wiles Elon Jester  Remember to initiate Hypoglycemia Order Set & complete

## 2012-03-09 NOTE — Progress Notes (Signed)
Occupational Therapy Session Note  Patient Details  Name: Judith [name] MRN: [id number] Date of Birth: 1941/01/03  Today's Date: 03/09/2012 Time: 2725-3664 Time Calculation (min): 30 min  Short Term Goals: Week 1:  OT Short Term Goal 1 (Week 1): Pt. wiill be stand for periarea care with mod assist OT Short Term Goal 2 (Week 1): Pt. will be mmoderate assist with LB bathing OT Short Term Goal 3 (Week 1): Pt. will be moderate assist with donning pants OT Short Term Goal 4 (Week 1): Pt. will transfer to [facility] with mod assist OT Short Term Goal 5 (Week 1): Pt. will recall sequence for going from sit to stand  Skilled Therapeutic Interventions/Progress Updates:    Pt seen for 1:1 OT session with focus on transfers, sit <>stand and sequencing during transfers and standing to increase participation with LB dressing. Pt in bed upon arrival and reported need to void. Pt completed bed mobility with Min A with head of bed raised. Completed slide board transfer to [facility] with Mod A and questioning verbal cues for sequencing and safe hand placement on board. Sit <> stand X 6 with Max A and a second helper to pull up and down pants/brief. Pt toileted and completed peri care with setup to obtain items and supervision for safety while leaning. Pt completed slide board transfer to return to [facility] with Mod-Min A and VC for hand placement.   Therapy Documentation Precautions:  Precautions Precautions: Fall Precaution Comments: R BKA Restrictions Weight Bearing Restrictions: Yes RLE Weight Bearing: Non weight bearing Other Position/Activity Restrictions: NWB R LE      Pain: No pain reported this session       See FIM for current functional status  Therapy/Group: Individual Therapy  [name] 03/09/2012, 3:43 PM

## 2012-03-09 NOTE — Significant Event (Signed)
Hypoglycemic Event  CBG: 48  Treatment: 15 GM carbohydrate snack  Symptoms: None  Follow-up CBG: Time:1645 CBG Result:38  Possible Reasons for Event: Unknown  Comments/MD notified:n/a    [name], Judith Judith [name]  Remember to initiate Hypoglycemia Order Set & complete

## 2012-03-09 NOTE — Progress Notes (Signed)
Physical Therapy Session Note  Patient Details  Name: Judith [name] MRN: [id number] Date of Birth: Apr 23, 1940  Today's Date: 03/09/2012 Time: 0830-0930 Time Calculation (min): 60 min  Short Term Goals: Week 1:  PT Short Term Goal 1 (Week 1): Patient will be able to perform bed mobility with S/min-A (has hospital bed at home) PT Short Term Goal 2 (Week 1): Patient will be able to perform transfers with Mod-Assist  PT Short Term Goal 3 (Week 1): Patient will be able to perform wheelchair mobility and management x 150' with S/Independent assist.  Skilled Therapeutic Interventions/Progress Updates:    Patient received supine in bed. Today's session focused on bed mobility, transfers, and wheelchair mobility. Assisted patient with donning pants, patient used bed rails to roll to bilateral sides and supervision, PT pulls pants up. Patient performs squat pivot transfer bed>wheelchair to the L and requires total assist, patient very passive and does not assist with any aspect of transfer. Prior to transfer, patient educated on sequencing and technique for transfer and when asked if ready, she states "yes" and puts arms up in air, as if to be lifted. Patient educated that she needs to assist with transfer and utilize strength in L LE.  Patient performed slide board transfer training wheelchair<>mat, see details below. Patient instructed in wheelchair mobility 125' x2 with supervision and requires increased time to complete.  Patient returned to room and left seated in wheelchair with all needs within reach.  Therapy Documentation Precautions:  Precautions Precautions: Fall Precaution Comments: R BKA Restrictions Weight Bearing Restrictions: Yes RLE Weight Bearing: Non weight bearing Other Position/Activity Restrictions: NWB R LE Pain: Pain Assessment Pain Assessment: No/denies pain Pain Score: 0-No pain Mobility: Bed Mobility Bed Mobility: Supine to Sit Supine to Sit: 5: Supervision;With  rails;HOB elevated Supine to Sit Details: Verbal cues for precautions/safety;Verbal cues for sequencing;Verbal cues for technique Transfers Squat Pivot Transfers: 1: +1 Total assist Squat Pivot Transfer Details: Tactile cues for initiation;Manual facilitation for weight bearing;Manual facilitation for placement;Manual facilitation for weight shifting;Verbal cues for precautions/safety;Verbal cues for technique;Verbal cues for sequencing Squat Pivot Transfer Details (indicate cue type and reason): Patient completely passive with squat pivot transfer; does not initiate anterior weight shift, lifting, or any sequencing during transfer. Lateral/Scoot Transfers: With slide board;With armrests removed;3: Mod assist;2: Max assist Lateral/Scoot Transfer Details: Tactile cues for initiation;Manual facilitation for weight bearing;Manual facilitation for placement;Manual facilitation for weight shifting;Verbal cues for safe use of DME/AE;Verbal cues for precautions/safety;Verbal cues for technique;Verbal cues for sequencing;Visual cues/gestures for sequencing Lateral/Scoot Transfer Details (indicate cue type and reason): Patient performed slide board transfers wheelchair<>mat x 2 with max assist. Mat positioned higher than wheelchair, so patient requires max assist wheelchair>mat. For mat>wheelchair (wheelchair positioned lower than mat), patient begins transfer scooting with mod assist, but stops scooting and begins to slide into chair. When this happens, patient wraps B UE around therapist for stabilization, causing therapist to become slightly off balance. Patient requires total assist to complete transfer and reposition to sitting upright in chair. Discussion and education with patient about using wheelchair arm rests and surface she is transferring from as stable objects during transfers. Educated patient that she is not to hold onto any staff when they are assisting her. Patient verbalized  understanding. Locomotion : Ambulation Ambulation: No Ambulation/Gait Assistance: Not tested (comment) Gait Gait: No Stairs / Additional Locomotion Stairs: No Wheelchair Mobility Wheelchair Mobility: Yes Wheelchair Assistance: 5: Supervision;Other (comment) (Requires increased time to complete) Wheelchair Assistance Details: Verbal cues for precautions/safety;Verbal cues  for sequencing;Verbal cues for technique Wheelchair Propulsion: Both upper extremities Wheelchair Parts Management: Needs assistance Distance: 125 x2   See FIM for current functional status  Therapy/Group: Individual Therapy  Chipper Herb. Aiman Noe, PT, DPT  03/09/2012, 10:37 AM

## 2012-03-09 NOTE — Progress Notes (Signed)
Occupational Therapy Session Note  Patient Details  Name: Judith [name] MRN: [id number] Date of Birth: [birth year]  Today's Date: 03/09/2012 Time: 5284-1324 Time Calculation (min): 59 min  Short Term Goals: Week 1:  OT Short Term Goal 1 (Week 1): Pt. wiill be stand for periarea care with mod assist OT Short Term Goal 2 (Week 1): Pt. will be mmoderate assist with LB bathing OT Short Term Goal 3 (Week 1): Pt. will be moderate assist with donning pants OT Short Term Goal 4 (Week 1): Pt. will transfer to [facility] with mod assist OT Short Term Goal 5 (Week 1): Pt. will recall sequence for going from sit to stand  Skilled Therapeutic Interventions/Progress Updates:    Pt seen for ADL retraining with focus on bed mobility, weight shifting, and transfers to increase participation and safety with self-care tasks of bathing and dressing. Pt in WC upon arrival and reported nursing had just completed peri care. Engaged in UB bathing and dressing seated at sink. Pt had difficulty donning bra, with several attempts before successfully problem solving. Pt completed LB dressing using lateral leans to doff and don pants. Pt able to thread pants, but requires physical assist to pull up pants and max VC for sequencing with lateral leans. Max assist slide board transfer to bed with +2, second person needed due to uphill grade. Pt engaged in donning sock/shoe seated EOB using mattress to support leg while reaching. Pt able to don sock and attempted shoe, but was unable to tie shoe in this position. Completed scoots Rt and Lt up and down mattress to increase participation in sliding board transfers and bed mobility with supervision and VC for sequencing.    Therapy Documentation Precautions:  Precautions Precautions: Fall Precaution Comments: R BKA Restrictions Weight Bearing Restrictions: Yes RLE Weight Bearing: Non weight bearing Other Position/Activity Restrictions: NWB R LE    Pain: Pain Assessment Pain  Assessment: No/denies pain Pain Score: 0-No pain  See FIM for current functional status  Therapy/Group: Individual Therapy  [name] 03/09/2012, 12:18 PM

## 2012-03-09 NOTE — Progress Notes (Signed)
Reviewed and agree with the attached treatment note.  Jenilyn Magana, OTR/L 

## 2012-03-09 NOTE — Plan of Care (Signed)
Problem: RH SKIN INTEGRITY Goal: RH STG ABLE TO PERFORM INCISION/WOUND CARE W/ASSISTANCE STG Able To Perform Incision/Wound Care With min Assistance.  Outcome: Not Progressing Pt is not participating in care of incision or dressing changes, total assist of staff to perform these task

## 2012-03-09 NOTE — Progress Notes (Signed)
Physical Therapy Session Note  Patient Details  Name: Judith [name] MRN: [id number] Date of Birth: April 14, 1940  Today's Date: 03/09/2012 Time: 1415-1500 Time Calculation (min): 45 min  Skilled Therapeutic Interventions/Progress Updates:   Patient performed w/c mobility in controlled environment x 100' with bilat UE propulsion for UE strengthening and endurance training.  Performed slideboard transfers w/c <> mat uphill and downhill with mod A overall with verbal and visual cues for hand placement and sequencing with tactile cues for trunk elongation, trunk lean and rotation for use of head hips relationship to advance buttocks.  On mat performed UE and LE strengthening with 10 reps tricep pushups with pushup blocks and coming to squat position and maintaining for 3-5 seconds with mod A for shoulder depression, elbow extension, trunk elongation during anterior lean.  Performed sit > stand from elevated mat with mod-max A and bilat UE support on back of w/c and maintained for 20-30 seconds with verbal and tactile cues to maintain upright trunk posture, full L hip and knee extension to maintain COG over BOS; fatigued quickly during second stand and presented with increased posterior LOB.   Therapy Documentation Precautions:  Precautions Precautions: Fall Precaution Comments: R BKA Restrictions Weight Bearing Restrictions: Yes RLE Weight Bearing: Non weight bearing Other Position/Activity Restrictions: NWB R LE Vital Signs: Therapy Vitals Temp: 98.4 F (36.9 C) Temp src: Oral Pulse Rate: 83 Resp: 18 BP: 117/73 mmHg Patient Position, if appropriate: Lying Oxygen Therapy SpO2: 97 % O2 Device: None (Room air) Pain:  No c/o pain  See FIM for current functional status  Therapy/Group: Individual Therapy  Edman Circle [facility] 03/09/2012, 8:01 PM [id number]}

## 2012-03-10 ENCOUNTER — Inpatient Hospital Stay (HOSPITAL_COMMUNITY): Payer: Medicare Other | Admitting: *Deleted

## 2012-03-10 ENCOUNTER — Inpatient Hospital Stay (HOSPITAL_COMMUNITY): Payer: Medicare Other | Admitting: Occupational Therapy

## 2012-03-10 ENCOUNTER — Inpatient Hospital Stay (HOSPITAL_COMMUNITY): Payer: Medicare Other | Admitting: Physical Therapy

## 2012-03-10 LAB — GLUCOSE, CAPILLARY
Glucose-Capillary: 105 mg/dL — ABNORMAL HIGH (ref 70–99)
Glucose-Capillary: 112 mg/dL — ABNORMAL HIGH (ref 70–99)

## 2012-03-10 MED ORDER — INSULIN ASPART 100 UNIT/ML ~~LOC~~ SOLN
0.0000 [IU] | Freq: Three times a day (TID) | SUBCUTANEOUS | Status: DC
  Administered 2012-03-10: 5 [IU] via SUBCUTANEOUS
  Administered 2012-03-11: 2 [IU] via SUBCUTANEOUS
  Administered 2012-03-11: 5 [IU] via SUBCUTANEOUS

## 2012-03-10 NOTE — Progress Notes (Signed)
Physical Therapy Session Note  Patient Details  Name: Judith [name] MRN: [id number] Date of Birth: [birth year]  Today's Date: 03/10/2012 Time: 1345-1430 Time Calculation (min): 45 min  Short Term Goals: Week 1:  PT Short Term Goal 1 (Week 1): Patient will be able to perform bed mobility with S/min-A (has hospital bed at home) PT Short Term Goal 2 (Week 1): Patient will be able to perform transfers with Mod-Assist  PT Short Term Goal 3 (Week 1): Patient will be able to perform wheelchair mobility and management x 150' with S/Independent assist.  Skilled Therapeutic Interventions/Progress Updates:   Patient in bed with pants donned secondary to incontinent episode earlier this pm.  Patient performed L and R rolling in bed with use of bed rails and min A with verbal cues for LE flexion and sequence to assist with donning clean pants and for supine > sit with min-mod A and bed rail.  Patient performed slideboard bed > w/c with min-mod A overall with assistance to maintain lateral and forward leans to advance buttocks across board.  Patient performed w/c mobility on unit x 100' with bilat UE propulsion for UE strengthening and endurance and 25' forwards and backwards with LLE closed chain hamstring curls and closed chain LAQ for hamstring and quad strengthening during LE propulsion.  Patient performed sit <> stands in standing frame for prolonged static standing x 2 reps x 1:00 each with focus on activation of LLE hip and knee extensors and upright trunk posture during alternating UE tasks.  Required one sitting rest break secondary to fatigue and anxiety.  No c/o pain in R hip during standing.   Therapy Documentation Precautions:  Precautions Precautions: Fall Precaution Comments: R BKA Restrictions Weight Bearing Restrictions: Yes RLE Weight Bearing: Non weight bearing Other Position/Activity Restrictions: NWB R LE General: Amount of Missed PT Time (min): 15 Minutes Missed Time Reason: Other  (comment) (Pt eating breakfast and asked PT to return later) Pain: Pain Assessment Pain Assessment: No/denies pain Pain Score: 0-No pain Locomotion : Ambulation Ambulation/Gait Assistance: Not tested (comment) Wheelchair Mobility Wheelchair Mobility: Yes Wheelchair Assistance: 5: Supervision;Other (comment) (Requires increased time to complete) Wheelchair Assistance Details: Verbal cues for precautions/safety;Verbal cues for sequencing;Verbal cues for technique Wheelchair Propulsion: Both upper extremities Wheelchair Parts Management: Needs assistance Distance: 100   See FIM for current functional status  Therapy/Group: Individual Therapy  Edman Circle [facility] 03/10/2012, 3:21 PM

## 2012-03-10 NOTE — Progress Notes (Signed)
Patient ID: Judith [name], female   DOB: 21-Jan-1941, 72 y.o.   MRN: [id number] Subjective/Complaints: 72 y.o. right-handed female with history of insulin-dependent diabetes mellitus as well as right below-knee amputation 2010 and uses a prosthesis provided by [employer] as well as rolling walker. Patient is a resident of [facility]. Admitted 02/29/2012 after a recent fall at home without loss of consciousness. She was taken to the emergency room with findings of mildly displaced fracture of the distal femur placed in a splint maintain nonweightbearing status and discharged home. She presented to the orthopedic office for followup x-rays repeated showing a displaced right distal femoral fracture. Underwent ORIF 03/01/2012 per Dr. Carola Frost. She remains nonweightbearing x6 weeks  No new issues overnite.  Hypoglycemic yesterday pm after lunch.  Elevated CBG at lunchtime, received SSI  Review of Systems  Constitutional: Negative for fever and chills.  Respiratory: Negative for cough and shortness of breath.   Gastrointestinal: Negative for diarrhea and constipation.  Genitourinary: Negative for dysuria.  All other systems reviewed and are negative.    Objective: Vital Signs: Blood pressure 115/57, pulse 80, temperature 98.3 F (36.8 C), temperature source Oral, resp. rate 17, height 5\' 4"  (1.626 m), weight 90.357 kg (199 lb 3.2 oz), SpO2 95.00%. No results found. Results for orders placed during the hospital encounter of 03/04/12 (from the past 72 hour(s))  GLUCOSE, CAPILLARY     Status: Abnormal   Collection Time    03/07/12 12:09 PM      Result Value Range   Glucose-Capillary 221 (*) 70 - 99 mg/dL   Comment 1 Notify RN    GLUCOSE, CAPILLARY     Status: Abnormal   Collection Time    03/07/12  4:55 PM      Result Value Range   Glucose-Capillary 44 (*) 70 - 99 mg/dL   Comment 1 Notify RN    GLUCOSE, CAPILLARY     Status: Abnormal   Collection Time    03/07/12   5:10 PM      Result Value Range   Glucose-Capillary 43 (*) 70 - 99 mg/dL   Comment 1 Notify RN    GLUCOSE, CAPILLARY     Status: None   Collection Time    03/07/12  5:26 PM      Result Value Range   Glucose-Capillary 78  70 - 99 mg/dL   Comment 1 Notify RN    GLUCOSE, CAPILLARY     Status: Abnormal   Collection Time    03/07/12  5:58 PM      Result Value Range   Glucose-Capillary 150 (*) 70 - 99 mg/dL   Comment 1 Notify RN    GLUCOSE, CAPILLARY     Status: Abnormal   Collection Time    03/07/12  9:24 PM      Result Value Range   Glucose-Capillary 182 (*) 70 - 99 mg/dL  GLUCOSE, CAPILLARY     Status: Abnormal   Collection Time    03/08/12  7:29 AM      Result Value Range   Glucose-Capillary 246 (*) 70 - 99 mg/dL   Comment 1 Notify RN    GLUCOSE, CAPILLARY     Status: Abnormal   Collection Time    03/08/12 12:08 PM      Result Value Range   Glucose-Capillary 193 (*) 70 - 99 mg/dL   Comment 1 Notify RN    GLUCOSE, CAPILLARY     Status: None   Collection Time  03/08/12  5:17 PM      Result Value Range   Glucose-Capillary 79  70 - 99 mg/dL   Comment 1 Notify RN    GLUCOSE, CAPILLARY     Status: Abnormal   Collection Time    03/08/12  8:46 PM      Result Value Range   Glucose-Capillary 150 (*) 70 - 99 mg/dL   Comment 1 Notify RN    GLUCOSE, CAPILLARY     Status: Abnormal   Collection Time    03/09/12  7:28 AM      Result Value Range   Glucose-Capillary 132 (*) 70 - 99 mg/dL   Comment 1 Notify RN    GLUCOSE, CAPILLARY     Status: Abnormal   Collection Time    03/09/12 10:49 AM      Result Value Range   Glucose-Capillary 262 (*) 70 - 99 mg/dL  GLUCOSE, CAPILLARY     Status: Abnormal   Collection Time    03/09/12  4:33 PM      Result Value Range   Glucose-Capillary 48 (*) 70 - 99 mg/dL   Comment 1 Notify RN    GLUCOSE, CAPILLARY     Status: Abnormal   Collection Time    03/09/12  4:47 PM      Result Value Range   Glucose-Capillary 38 (*) 70 - 99 mg/dL    Comment 1 Notify RN    GLUCOSE, CAPILLARY     Status: Abnormal   Collection Time    03/09/12  5:02 PM      Result Value Range   Glucose-Capillary 35 (*) 70 - 99 mg/dL   Comment 1 Notify RN    GLUCOSE, CAPILLARY     Status: Abnormal   Collection Time    03/09/12  5:05 PM      Result Value Range   Glucose-Capillary 39 (*) 70 - 99 mg/dL   Comment 1 Notify RN    GLUCOSE, CAPILLARY     Status: None   Collection Time    03/09/12  5:21 PM      Result Value Range   Glucose-Capillary 88  70 - 99 mg/dL   Comment 1 Notify RN    GLUCOSE, CAPILLARY     Status: Abnormal   Collection Time    03/09/12  9:16 PM      Result Value Range   Glucose-Capillary 166 (*) 70 - 99 mg/dL   Comment 1 Notify RN    GLUCOSE, CAPILLARY     Status: Abnormal   Collection Time    03/10/12  7:17 AM      Result Value Range   Glucose-Capillary 153 (*) 70 - 99 mg/dL   Comment 1 Notify RN       HEENT: normal Cardio: RRR Resp: CTA B/L GI: BS positive Extremity:  No Edema Skin:   Wound C/D/I and Other Left heel normal Neuro: Alert/Oriented, Abnormal Sensory reduced sensation in finger tips and below ankle LLE, Normal Motor and Other L foot and bilateral hand intrinsic atrophy Musc/Skel:  Other R BKA stump well healed mild edema Gen: NAD   Assessment/Plan: 1. Functional deficits secondary to R supracondylar femur fracture in a pt with R BKA, NWB x 6 weeks which require 3+ hours per day of interdisciplinary therapy in a comprehensive inpatient rehab setting. Physiatrist is providing close team supervision and 24 hour management of active medical problems listed below. Physiatrist and rehab team continue to assess barriers to discharge/monitor patient progress  toward functional and medical goals. FIM: FIM - Bathing Bathing Steps Patient Completed: Chest;Right Arm;Left Arm;Abdomen;Right upper leg;Left upper leg Bathing: 3: Mod-Patient completes 5-7 64f 10 parts or 50-74%  FIM - Upper Body  Dressing/Undressing Upper body dressing/undressing steps patient completed: Thread/unthread right bra strap;Thread/unthread left bra strap;Hook/unhook bra;Thread/unthread right sleeve of pullover shirt/dresss;Thread/unthread left sleeve of pullover shirt/dress;Put head through opening of pull over shirt/dress;Pull shirt over trunk Upper body dressing/undressing: 5: Set-up assist to: Obtain clothing/put away FIM - Lower Body Dressing/Undressing Lower body dressing/undressing steps patient completed: Thread/unthread right pants leg;Thread/unthread left pants leg;Don/Doff left sock Lower body dressing/undressing: 3: Mod-Patient completed 50-74% of tasks  FIM - Toileting Toileting: 1: Two helpers  FIM - Diplomatic Services operational officer Devices: Human resources officer Transfers: 1-Two helpers;2-To toilet/BSC: Max A (lift and lower assist);3-From toilet/BSC: Mod A (lift or lower assist)  FIM - Banker Devices: Sliding board;Bed rails;Arm rests Bed/Chair Transfer: 1: Two helpers;1: Chair or W/C > Bed: Total A (helper does all/Pt. < 25%);4: Sit > Supine: Min A (steadying pt. > 75%/lift 1 leg)  FIM - Locomotion: Wheelchair Distance: 125 Locomotion: Wheelchair: 2: Travels 50 - 149 ft with supervision, cueing or coaxing FIM - Locomotion: Ambulation Ambulation/Gait Assistance: Not tested (comment) Locomotion: Ambulation: 0: Activity did not occur  Comprehension Comprehension Mode: Auditory Comprehension: 5-Follows basic conversation/direction: With no assist  Expression Expression Mode: Verbal Expression: 5-Expresses basic needs/ideas: With extra time/assistive device  Social Interaction Social Interaction: 6-Interacts appropriately with others with medication or extra time (anti-anxiety, antidepressant).  Problem Solving Problem Solving: 5-Solves complex 90% of the time/cues < 10% of the time  Memory Memory: 4-Recognizes or  recalls 75 - 89% of the time/requires cueing 10 - 24% of the time   Medical Problem List and Plan:  1. Right supracondylar femur fracture status post ORIF 03/01/2012  2. DVT Prophylaxis/Anticoagulation: Subcutaneous Lovenox. Monitor platelet counts any signs of bleeding  3. Pain Management: Hydrocodone and Robaxin as needed. Monitor with increased mobility.  4. Neuropsych: This patient is capable of making decisions on his/her own behalf.  5. Postoperative anemia. Latest hemoglobin 8.6. Followup CBC. Patient is asymptomatic  6. History right below-knee amputation 2010. Patient does have a prosthesis per [facility] [employer]  7. Diabetes mellitus with peripheral neuropathy. uncontrolledHemoglobin A1c 8.0.Increase Levemir 25 Units for high am CBG ,D/C NovoLog 4 units 3 times a day. Check blood sugars a.c. and at bedtime. Titrate regimen as needed. Consider restart glucotrol.  Am CBG improved Change SSI to moderate 8. Hypertension. Clonidine 0.05 mg daily. Monitor with increased activity  9. Preadmission Klebsiella urine tract infection. Keflex initiated 03/02/2012. Patient denies dysuria  10. Hyperlipidemia. Zocor  LOS (Days) 6 A FACE TO FACE EVALUATION WAS PERFORMED  KIRSTEINS,ANDREW E 03/10/2012, 10:05 AM

## 2012-03-10 NOTE — Progress Notes (Signed)
Occupational Therapy Session Note  Patient Details  Name: Judith [name] MRN: [id number] Date of Birth: [birth year]  Today's Date: 03/10/2012 Time: 1132-1204 Time Calculation (min): 32 min  Short Term Goals: Week 1:  OT Short Term Goal 1 (Week 1): Pt. wiill be stand for periarea care with mod assist OT Short Term Goal 2 (Week 1): Pt. will be mmoderate assist with LB bathing OT Short Term Goal 3 (Week 1): Pt. will be moderate assist with donning pants OT Short Term Goal 4 (Week 1): Pt. will transfer to [facility] with mod assist OT Short Term Goal 5 (Week 1): Pt. will recall sequence for going from sit to stand  Skilled Therapeutic Interventions/Progress Updates:    Pt seen for 1:1 OT session with focus on sit<>stands, standing balance and squat pivot transfers to increase participation with LB dressing and bathing. After several unsuccessful attempts, pt completed 3 sit<>stands in gym outside of parallel bars with Max A + 2 and VC for sequencing and hand placement. Pt has difficulty extending hips and achieving upright standing, but was able to maintain standing for 30 sec - each time. Pt was incontinent of urine, completed squat pivot transfer with max A +2 to return to bed. Bed mobility with Min A to change brief.   Therapy Documentation Precautions:  Precautions Precautions: Fall Precaution Comments: R BKA Restrictions Weight Bearing Restrictions: Yes RLE Weight Bearing: Non weight bearing Other Position/Activity Restrictions: NWB R LE    Pain: Pain Assessment Pain Assessment: No/denies pain Pain Score: 0-No pain  See FIM for current functional status  Therapy/Group: Individual Therapy  [name] 03/10/2012, 12:54 PM

## 2012-03-10 NOTE — Progress Notes (Signed)
Occupational Therapy Session Note  Patient Details  Name: Judith [name] MRN: [id number] Date of Birth: Oct 15, 1940  Today's Date: 03/10/2012 Time: 0932-1030 Time Calculation (min): 58 min  Short Term Goals: Week 1:  OT Short Term Goal 1 (Week 1): Pt. wiill be stand for periarea care with mod assist OT Short Term Goal 2 (Week 1): Pt. will be mmoderate assist with LB bathing OT Short Term Goal 3 (Week 1): Pt. will be moderate assist with donning pants OT Short Term Goal 4 (Week 1): Pt. will transfer to [facility] with mod assist OT Short Term Goal 5 (Week 1): Pt. will recall sequence for going from sit to stand  Skilled Therapeutic Interventions/Progress Updates:  Pt seen for ADL retraining with focus on bed mobility and weight shifting to increase participation and safety with self-care tasks of bathing and dressing. Pt in WC upon arrival. Engaged in UB bathing and dressing seated at sink. Pt attempted to stand at sink X 3 with Max A, but was unable to extend hips into upright posture. Completed LB bathing and dressing using lateral leans to wash bottom and don pants and brief. Pt able to thread pants, but requires physical assist to pull up pants and max VC for sequencing with lateral leans.  Pt engaged in donning sock/shoe and tie shoe using chair placed in front of her to support leg while reaching.  Therapy Documentation Precautions:  Precautions Precautions: Fall Precaution Comments: R BKA Restrictions Weight Bearing Restrictions: Yes RLE Weight Bearing: Non weight bearing Other Position/Activity Restrictions: NWB R LE    Pain: Pain Assessment Pain Assessment: No/denies pain Pain Score: 0-No pain   Therapy/Group: Individual Therapy  [name] 03/10/2012, 12:43 PM

## 2012-03-10 NOTE — Progress Notes (Signed)
Physical Therapy Session Note  Patient Details  Name: Judith [name] MRN: [id number] Date of Birth: 02-01-1940  Today's Date: 03/10/2012 Time: 7829-5621 Time Calculation (min): 45 min  Short Term Goals: Week 1:  PT Short Term Goal 1 (Week 1): Patient will be able to perform bed mobility with S/min-A (has hospital bed at home) PT Short Term Goal 2 (Week 1): Patient will be able to perform transfers with Mod-Assist  PT Short Term Goal 3 (Week 1): Patient will be able to perform wheelchair mobility and management x 150' with S/Independent assist.  Skilled Therapeutic Interventions/Progress Updates:    Patient received supine in bed. Today's session focused on wheelchair mobility and LE and UE strengthening exercises. Patient instructed in wheelchair mobility 100' x2 with supervision and requires increased time to complete.  Patient performed seated LE therapeutic exercise: B hip flexion, B knee flexion/extension, B hip adduction with 3" ball squeeze, L ankle pumps, wheelchair push ups x25 reps for all. Patient requires verbal cues during wheelchair push ups for anterior weight shift and pushing through L LE to assist with lifting.  Patient returned to room and left seated in wheelchair with all needs within reach.  Therapy Documentation Precautions:  Precautions Precautions: Fall Precaution Comments: R BKA Restrictions Weight Bearing Restrictions: Yes RLE Weight Bearing: Non weight bearing Other Position/Activity Restrictions: NWB R LE General: Amount of Missed PT Time (min): 15 Minutes Missed Time Reason: Other (comment) (Pt eating breakfast and asked PT to return later) Pain: Pain Assessment Pain Assessment: No/denies pain Pain Score: 0-No pain Locomotion : Ambulation Ambulation/Gait Assistance: Not tested (comment) Wheelchair Mobility Wheelchair Mobility: Yes Wheelchair Assistance: 5: Supervision;Other (comment) (Requires increased time to complete) Wheelchair Assistance  Details: Verbal cues for precautions/safety;Verbal cues for sequencing;Verbal cues for technique Wheelchair Propulsion: Both upper extremities Wheelchair Parts Management: Needs assistance Distance: 100 x2   See FIM for current functional status  Therapy/Group: Individual Therapy  Chipper Herb. Emmajo Bennette, PT, DPT  03/10/2012, 12:11 PM

## 2012-03-10 NOTE — Progress Notes (Signed)
Reviewed and agree with the attached treatment note.  Phillipe Clemon, OTR/L 

## 2012-03-10 NOTE — Progress Notes (Signed)
Reviewed and agree with the attached treatment note.  Marialena Wollen, OTR/L 

## 2012-03-11 ENCOUNTER — Inpatient Hospital Stay (HOSPITAL_COMMUNITY): Payer: Medicare Other | Admitting: *Deleted

## 2012-03-11 ENCOUNTER — Inpatient Hospital Stay (HOSPITAL_COMMUNITY): Payer: Medicare Other | Admitting: Physical Therapy

## 2012-03-11 ENCOUNTER — Inpatient Hospital Stay (HOSPITAL_COMMUNITY): Payer: Medicare Other | Admitting: Occupational Therapy

## 2012-03-11 LAB — GLUCOSE, CAPILLARY
Glucose-Capillary: 215 mg/dL — ABNORMAL HIGH (ref 70–99)
Glucose-Capillary: 35 mg/dL — CL (ref 70–99)
Glucose-Capillary: 40 mg/dL — CL (ref 70–99)
Glucose-Capillary: 39 mg/dL — CL (ref 70–99)

## 2012-03-11 LAB — CREATININE, SERUM: GFR calc Af Amer: 74 mL/min — ABNORMAL LOW (ref >90)

## 2012-03-11 MED ORDER — INSULIN DETEMIR 100 UNIT/ML ~~LOC~~ SOLN
14.0000 [IU] | Freq: Two times a day (BID) | SUBCUTANEOUS | Status: DC
  Administered 2012-03-12 – 2012-03-13 (×3): 14 [IU] via SUBCUTANEOUS

## 2012-03-11 MED ORDER — GLUCOSE 40 % PO GEL
ORAL | Status: AC
  Filled 2012-03-11: qty 1

## 2012-03-11 MED ORDER — INSULIN ASPART 100 UNIT/ML ~~LOC~~ SOLN
0.0000 [IU] | Freq: Three times a day (TID) | SUBCUTANEOUS | Status: DC
  Administered 2012-03-12: 2 [IU] via SUBCUTANEOUS

## 2012-03-11 MED ORDER — INSULIN DETEMIR 100 UNIT/ML ~~LOC~~ SOLN: 28.0000 [IU] | Freq: Every day | SUBCUTANEOUS | Status: DC

## 2012-03-11 MED ORDER — GLUCOSE 40 % PO GEL
ORAL | Status: AC
  Administered 2012-03-11: 37.5 g via ORAL
  Filled 2012-03-11: qty 1

## 2012-03-11 MED ORDER — INSULIN ASPART 100 UNIT/ML ~~LOC~~ SOLN
9.0000 [IU] | Freq: Once | SUBCUTANEOUS | Status: AC
  Administered 2012-03-11: 9 [IU] via SUBCUTANEOUS

## 2012-03-11 MED ORDER — DEXTROSE 50 % IV SOLN
INTRAVENOUS | Status: AC
  Administered 2012-03-11: 50 mL via INTRAVENOUS
  Filled 2012-03-11: qty 50

## 2012-03-11 NOTE — Progress Notes (Signed)
Social Work Patient ID: Judith [name], female   DOB: [birth year], 72 y.o.   MRN: [id number] Met with pt and spoke with granddaughter-Jolonda to inform team conference goals -min level goals and discharge 03/23/2012. Aware pt will require 24 hour care at discharge.  Encouraged caregivers to be here to attend therapies with pt and begin learning her Care.  Work toward discharge and family education to begin.

## 2012-03-11 NOTE — Progress Notes (Signed)
Physical Therapy Weekly Progress Note  Patient Details  Name: Judith [name] MRN: [id number] Date of Birth: November 10, 1940  Today's Date: 03/11/2012 Time: 1500-1530 Time Calculation (min): 30 min  Patient is making slow but steady progress and has met 1 of 3 short term goals.  Patient is currently min-mod A for bed mobility with bed rails, bed <> w/c transfers with slideboard, supervision w/c mobility short distances, and max-total A for static standing. Patient has been unable to perform any hopping on LLE for stand pivot transfers.    Patient continues to demonstrate the following deficits: impaired activity tolerance, endurance, impaired UE and LE strength, impaired trunk control, dynamic standing balance, currently unable to ambulate and therefore will continue to benefit from skilled PT intervention to enhance overall performance with activity tolerance, balance, postural control and functional use of  right upper extremity, left upper extremity and left lower extremity.  Patient progressing toward long term goals..  Continue plan of care.  PT Short Term Goals Week 1:  PT Short Term Goal 1 (Week 1): Patient will be able to perform bed mobility with S/min-A (has hospital bed at home) PT Short Term Goal 1 - Progress (Week 1): Partly met PT Short Term Goal 2 (Week 1): Patient will be able to perform transfers with Mod-Assist  PT Short Term Goal 2 - Progress (Week 1): Met PT Short Term Goal 3 (Week 1): Patient will be able to perform wheelchair mobility and management x 150' with S/Independent assist. PT Short Term Goal 3 - Progress (Week 1): Partly met Week 2:  PT Short Term Goal 1 (Week 2): = LTG min A overall w/c level   Skilled Therapeutic Interventions/Progress Updates:   Discussed with patient how she was transported in community PTA; reports she would ride with her daughter in law and would transfer via stand pivot w/c <> car with RW.  Discussed with patient that without her prosthesis she  may have to performed slideboard transfers in/out of car; patient could not remember what type of car or height of car that her daughter in law drives.  Demonstrated to patient how to perform w/c <> car transfer with slideboard and had patient return demonstrate level slideboard transfer w/c <> car with min A overall for lateral and anterior trunk leans for head hips relationship and to assist LLE into and out of car.  Will need to perform on real car prior to D/C. Continued LE strengthening with LLE w/c propulsion forwards and backwards for quad and hamstring strengthening x 40' total each direction with min-mod A for hamstring curls.    Therapy Documentation Precautions:  Precautions Precautions: Fall Precaution Comments: R BKA Restrictions Weight Bearing Restrictions: Yes RLE Weight Bearing: Non weight bearing Other Position/Activity Restrictions: NWB R LE Vital Signs: Therapy Vitals Temp: 98.4 F (36.9 C) Temp src: Oral Pulse Rate: 86 Resp: 18 BP: 143/78 mmHg Patient Position, if appropriate: Sitting Oxygen Therapy SpO2: 98 % O2 Device: None (Room air) Pain: Pain Assessment Pain Assessment: No/denies pain  See FIM for current functional status  Therapy/Group: Individual Therapy  Edman Circle [place] [facility] 03/11/2012, 4:45 PM

## 2012-03-11 NOTE — Progress Notes (Signed)
Physical Therapy Note  Patient Details  Name: Judith [name] MRN: [id number] Date of Birth: Jun 16, 1940 Today's Date: 03/11/2012  1530-1555 (25 minutes) individual Pain: no reported pain Focus of treatment: Sit to stand/ standing tolerance to decrease assist during toileting/hygiene Treatment: Sit to stand to rail on steps (pulling up)  x 4 mod assist . Pt able to stand 30 seconds or less before c/o fatigue/ decreased strength LT LE.   Judith [name],JIM 03/11/2012, 7:25 AM

## 2012-03-11 NOTE — Progress Notes (Signed)
Hypoglycemic Event  CBG: 32  Treatment: D50 IV 25 mL  Symptoms: Sweaty  Follow-up CBG: Time:  1720 CBG Result: 39  Possible Reasons for Event: Unknown  Comments/MD notified:  Patient eating dinner. Alert, aware.    Sherlyn Lees  Remember to initiate Hypoglycemia Order Set & complete

## 2012-03-11 NOTE — Progress Notes (Signed)
Occupational Therapy Weekly Progress Note  Patient Details  Name: Judith [name] MRN: [id number] Date of Birth: [birth year]  Today's Date: 03/11/2012 Time: 4098-1191 Time Calculation (min): 44 min  Patient has met 3 of 5 short term goals.  Pt is Pt is Mod A for donning pants at chair level using lateral leans. Pt is Min A for LB bathing at bed level. Pt is Min-Mod A for transfer to [facility] using sliding board. Pt continues to require Max- total A to stand for perineal care or LB dressing and Max A -total A for squat pivot transfers. Pt is progressing with sequencing with sit<> stand, but still requires verbal cues.  Patient continues to demonstrate the following deficits: BUE weakness, decreased trunk control, difficulty sit <> stand, decreased activity tolerance/endurance and therefore will continue to benefit from skilled OT intervention to enhance overall performance with BADL and Reduce care partner burden.  Patient progressing toward long term goals..  Continue plan of care.  OT Short Term Goals Week 1:  OT Short Term Goal 1 (Week 1): Pt. wiill be stand for periarea care with mod assist OT Short Term Goal 2 (Week 1): Pt. will be mmoderate assist with LB bathing OT Short Term Goal 3 (Week 1): Pt. will be moderate assist with donning pants OT Short Term Goal 4 (Week 1): Pt. will transfer to [facility] with mod assist OT Short Term Goal 5 (Week 1): Pt. will recall sequence for going from sit to stand  Skilled Therapeutic Interventions/Progress Updates:    Pt seen for ADL retraining with focus on bed mobility and weight shifting to increase participation and safety with self-care tasks of bathing and dressing. Pt in bed upon arrival. Engaged in perineal care and changing brief at bed level with focus bed mobility and increasing participation in LB bathing. UB bathing and dressing seated at sink. U pants donned using lateral leans to don pants. Pt able to thread pants, but requires physical assist to pull up  pants and VC for sequencing with lateral leans.  Therapy Documentation Precautions:  Precautions Precautions: Fall Precaution Comments: R BKA Restrictions Weight Bearing Restrictions: Yes RLE Weight Bearing: Non weight bearing Other Position/Activity Restrictions: NWB R LE   Vital Signs:   Pain: Pain Assessment Pain Assessment: No/denies pain Pain Score: 0-No pain   Therapy/Group: Individual Therapy  [name] 03/11/2012, 1:03 PM

## 2012-03-11 NOTE — Progress Notes (Signed)
Hypoglycemic Event  CBG:  39  Treatment: 15 GM carbohydrate snack - Patient eating dinner  Symptoms: None  Follow-up CBG: Time: 1730 CBG Result:  188  Possible Reasons for Event: Unknown  Comments/MD notified:  Patient stable. Marissa Nestle, PA aware.    Sherlyn Lees  Remember to initiate Hypoglycemia Order Set & complete

## 2012-03-11 NOTE — Progress Notes (Signed)
At 1620, patient's blood sugar 40. Hypoglycemia protocol followed. Marissa Nestle, PA notified. Snack given. Rechecked 35 blood sugar. 15 gram glucose given. Patient assisted from wheel chair back to bed. Complaints of weakness, sweaty, and clammy. Rechecked 32 blood sugar. At 1720, another 15 gram glucose given. Patient eating dinner. Rechecked 39 blood sugar.  Amp of D50 given at 1730. Rechecked blood sugar 188 at 1755. Patient stable, alert. Sherlyn Lees , RN

## 2012-03-11 NOTE — Progress Notes (Signed)
Hypoglycemic Event  CBG: 35  Treatment: 15 GM carbohydrate snack  Symptoms: Sweaty  Follow-up CBG: Time: 1635 CBG Result: 32  Possible Reasons for Event: Unknown  Comments/MD notified:  Patient placed back in bed. Sweaty, weak.     Sherlyn Lees  Remember to initiate Hypoglycemia Order Set & complete

## 2012-03-11 NOTE — Progress Notes (Signed)
Reviewed and agree with the attached treatment note.  Mertice Uffelman, OTR/L 

## 2012-03-11 NOTE — Progress Notes (Signed)
Patient ID: Judith [name], female   DOB: [birth year], 72 y.o.   MRN: [id number] PSubjective/Complaints: 72 y.o. right-handed female with history of insulin-dependent diabetes mellitus as well as right below-knee amputation 2010 and uses a prosthesis provided by [employer] as well as rolling walker. Patient is a resident of [facility]. Admitted 02/29/2012 after a recent fall at home without loss of consciousness. She was taken to the emergency room with findings of mildly displaced fracture of the distal femur placed in a splint maintain nonweightbearing status and discharged home. She presented to the orthopedic office for followup x-rays repeated showing a displaced right distal femoral fracture. Underwent ORIF 03/01/2012 per Dr. Carola Frost. She remains nonweightbearing x6 weeks  No new issues overnite.  Hypoglycemic yesterday pm after lunch.  Elevated CBG at lunchtime, received SSI  Review of Systems  Constitutional: Negative for fever and chills.  Respiratory: Negative for cough and shortness of breath.   Gastrointestinal: Negative for diarrhea and constipation.  Genitourinary: Negative for dysuria.  All other systems reviewed and are negative.    Objective: Vital Signs: Blood pressure 119/68, pulse 83, temperature 98.4 F (36.9 C), temperature source Oral, resp. rate 18, height 5\' 4"  (1.626 m), weight 90.357 kg (199 lb 3.2 oz), SpO2 94.00%. No results found. Results for orders placed during the hospital encounter of 03/04/12 (from the past 72 hour(s))  GLUCOSE, CAPILLARY     Status: Abnormal   Collection Time    03/08/12 12:08 PM      Result Value Range   Glucose-Capillary 193 (*) 70 - 99 mg/dL   Comment 1 Notify RN    GLUCOSE, CAPILLARY     Status: None   Collection Time    03/08/12  5:17 PM      Result Value Range   Glucose-Capillary 79  70 - 99 mg/dL   Comment 1 Notify RN    GLUCOSE, CAPILLARY     Status: Abnormal   Collection Time    03/08/12  8:46  PM      Result Value Range   Glucose-Capillary 150 (*) 70 - 99 mg/dL   Comment 1 Notify RN    GLUCOSE, CAPILLARY     Status: Abnormal   Collection Time    03/09/12  7:28 AM      Result Value Range   Glucose-Capillary 132 (*) 70 - 99 mg/dL   Comment 1 Notify RN    GLUCOSE, CAPILLARY     Status: Abnormal   Collection Time    03/09/12 10:49 AM      Result Value Range   Glucose-Capillary 262 (*) 70 - 99 mg/dL  GLUCOSE, CAPILLARY     Status: Abnormal   Collection Time    03/09/12  4:33 PM      Result Value Range   Glucose-Capillary 48 (*) 70 - 99 mg/dL   Comment 1 Notify RN    GLUCOSE, CAPILLARY     Status: Abnormal   Collection Time    03/09/12  4:47 PM      Result Value Range   Glucose-Capillary 38 (*) 70 - 99 mg/dL   Comment 1 Notify RN    GLUCOSE, CAPILLARY     Status: Abnormal   Collection Time    03/09/12  5:02 PM      Result Value Range   Glucose-Capillary 35 (*) 70 - 99 mg/dL   Comment 1 Notify RN    GLUCOSE, CAPILLARY     Status: Abnormal   Collection Time  03/09/12  5:05 PM      Result Value Range   Glucose-Capillary 39 (*) 70 - 99 mg/dL   Comment 1 Notify RN    GLUCOSE, CAPILLARY     Status: None   Collection Time    03/09/12  5:21 PM      Result Value Range   Glucose-Capillary 88  70 - 99 mg/dL   Comment 1 Notify RN    GLUCOSE, CAPILLARY     Status: Abnormal   Collection Time    03/09/12  9:16 PM      Result Value Range   Glucose-Capillary 166 (*) 70 - 99 mg/dL   Comment 1 Notify RN    GLUCOSE, CAPILLARY     Status: Abnormal   Collection Time    03/10/12  7:17 AM      Result Value Range   Glucose-Capillary 153 (*) 70 - 99 mg/dL   Comment 1 Notify RN    GLUCOSE, CAPILLARY     Status: Abnormal   Collection Time    03/10/12 11:37 AM      Result Value Range   Glucose-Capillary 207 (*) 70 - 99 mg/dL   Comment 1 Notify RN    GLUCOSE, CAPILLARY     Status: Abnormal   Collection Time    03/10/12  4:33 PM      Result Value Range   Glucose-Capillary  105 (*) 70 - 99 mg/dL  GLUCOSE, CAPILLARY     Status: Abnormal   Collection Time    03/10/12  9:30 PM      Result Value Range   Glucose-Capillary 112 (*) 70 - 99 mg/dL   Comment 1 Notify RN    CREATININE, SERUM     Status: Abnormal   Collection Time    03/11/12  6:10 AM      Result Value Range   Creatinine, Ser 0.88  0.50 - 1.10 mg/dL   GFR calc non Af Amer 64 (*) >90 mL/min   GFR calc Af Amer 74 (*) >90 mL/min   Comment:            The eGFR has been calculated     using the CKD EPI equation.     This calculation has not been     validated in all clinical     situations.     eGFR's persistently     <90 mL/min signify     possible Chronic Kidney Disease.  GLUCOSE, CAPILLARY     Status: Abnormal   Collection Time    03/11/12  8:09 AM      Result Value Range   Glucose-Capillary 126 (*) 70 - 99 mg/dL     HEENT: normal Cardio: RRR Resp: CTA B/L GI: BS positive Extremity:  No Edema Skin:   Wound C/D/I and Other Left heel normal Neuro: Alert/Oriented, Abnormal Sensory reduced sensation in finger tips and below ankle LLE, Normal Motor and Other L foot and bilateral hand intrinsic atrophy Musc/Skel:  Other R BKA stump well healed mild edema Gen: NAD   Assessment/Plan: 1. Functional deficits secondary to R supracondylar femur fracture in a pt with R BKA, NWB x 6 weeks which require 3+ hours per day of interdisciplinary therapy in a comprehensive inpatient rehab setting. Physiatrist is providing close team supervision and 24 hour management of active medical problems listed below. Physiatrist and rehab team continue to assess barriers to discharge/monitor patient progress toward functional and medical goals. FIM: FIM - Bathing Bathing Steps Patient Completed:  Chest;Right Arm;Left Arm;Abdomen;Front perineal area;Right upper leg;Left upper leg;Left lower leg (including foot) Bathing: 4: Min-Patient completes 8-9 20f 10 parts or 75+ percent  FIM - Upper Body  Dressing/Undressing Upper body dressing/undressing steps patient completed: Thread/unthread right sleeve of pullover shirt/dresss;Thread/unthread left sleeve of pullover shirt/dress;Put head through opening of pull over shirt/dress;Pull shirt over trunk Upper body dressing/undressing: 5: Set-up assist to: Obtain clothing/put away FIM - Lower Body Dressing/Undressing Lower body dressing/undressing steps patient completed: Thread/unthread left pants leg;Thread/unthread right pants leg;Don/Doff left sock;Don/Doff left shoe;Fasten/unfasten left shoe Lower body dressing/undressing: 3: Mod-Patient completed 50-74% of tasks  FIM - Toileting Toileting: 1: Two helpers  FIM - Diplomatic Services operational officer Devices: Human resources officer Transfers: 1-Two helpers;2-To toilet/BSC: Max A (lift and lower assist);3-From toilet/BSC: Mod A (lift or lower assist)  FIM - Bed/Chair Transfer Bed/Chair Transfer Assistive Devices: HOB elevated;Bed rails;Arm rests Bed/Chair Transfer: 1: Chair or W/C > Bed: Total A (helper does all/Pt. < 25%);4: Supine > Sit: Min A (steadying Pt. > 75%/lift 1 leg)  FIM - Locomotion: Wheelchair Distance: 215 Locomotion: Wheelchair: 5: Travels 150 ft or more: maneuvers on rugs and over door sills with supervision, cueing or coaxing FIM - Locomotion: Ambulation Ambulation/Gait Assistance: Not tested (comment) Locomotion: Ambulation: 0: Activity did not occur  Comprehension Comprehension Mode: Auditory Comprehension: 5-Follows basic conversation/direction: With no assist  Expression Expression Mode: Verbal Expression: 5-Expresses basic needs/ideas: With extra time/assistive device  Social Interaction Social Interaction: 6-Interacts appropriately with others with medication or extra time (anti-anxiety, antidepressant).  Problem Solving Problem Solving: 5-Solves basic 90% of the time/requires cueing < 10% of the time  Memory Memory: 4-Recognizes  or recalls 75 - 89% of the time/requires cueing 10 - 24% of the time   Medical Problem List and Plan:  1. Right supracondylar femur fracture status post ORIF 03/01/2012 -remove staples today 2. DVT Prophylaxis/Anticoagulation: Subcutaneous Lovenox. Monitor platelet counts any signs of bleeding  3. Pain Management: Hydrocodone and Robaxin as needed. Monitor with increased mobility.  4. Neuropsych: This patient is capable of making decisions on his/her own behalf.  5. Postoperative anemia. Latest hemoglobin 8.6. Followup CBC. Patient is asymptomatic  6. History right below-knee amputation 2010. Patient does have a prosthesis per [employer]  7. Diabetes mellitus with peripheral neuropathy. uncontrolledHemoglobin A1c 8.0.Increase Levemir 28 Units for high am CBG ,D/C NovoLog 4 units 3 times a day. Check blood sugars a.c. and at bedtime. Titrate regimen as needed. Consider restart glucotrol.  Am CBG improved Change SSI to moderate 8. Hypertension. Clonidine 0.05 mg daily. Monitor with increased activity  9. Preadmission Klebsiella urine tract infection. Keflex initiated 03/02/2012.Complete course Patient denies dysuria  10. Hyperlipidemia. Zocor  LOS (Days) 7 A FACE TO FACE EVALUATION WAS PERFORMED  Jhovanny Guinta E 03/11/2012, 9:54 AM

## 2012-03-11 NOTE — Progress Notes (Addendum)
Pt CBG was 354 at 2037. Received order from Dr. Posey Rea to give on time 9 unit of insulin Novolog according to sensitive scale

## 2012-03-11 NOTE — Progress Notes (Signed)
Hypoglycemic Event  CBG: 40  Treatment: 15 GM carbohydrate snack  Symptoms: weak  Follow-up CBG: Time: 1620 CBG Result: 35  Possible Reasons for Event: Unknown  Comments/MD notified: Patient complains of weakness. Will continue to monitor blood sugar and contact MD.    Sherlyn Lees  Remember to initiate Hypoglycemia Order Set & complete

## 2012-03-11 NOTE — Progress Notes (Addendum)
Occupational Therapy Session Note  Patient Details  Name: Judith [name] MRN: [id number] Date of Birth: September 30, 1940  Today's Date: 03/11/2012 Time: 7829-5621 Time Calculation (min): 29 min   Skilled Therapeutic Interventions/Progress Updates:    Pt seen for 1:1 OT session with focus on transfers and sit<>stand to increase participation in self care tasks and LB dressing. Pt completed bed mobility with supervision with head of bed lowered. Donned sock and shoe (including fastening shoe) sitting EOB with supervision. Slide board transfers from bed ->WC and WC<>mat with Min A and VC for sequencing. Discussed fearfulness of standing with her, pt reports she feels like her "leg is going to give out". Assured pt that she is not likely to fall with mat table behind her. Pt stood X2 with Max A and VC for sequencing.   Therapy Documentation Precautions:  Precautions Precautions: Fall Precaution Comments: R BKA Restrictions Weight Bearing Restrictions: Yes RLE Weight Bearing: Non weight bearing Other Position/Activity Restrictions: NWB R LE  See FIM for current functional status  Therapy/Group: Individual Therapy  [name] 03/11/2012, 2:43 PM

## 2012-03-11 NOTE — Progress Notes (Signed)
11 Sutures removed w/o complications, pt tolerated well.

## 2012-03-11 NOTE — Progress Notes (Signed)
Physical Therapy Session Note  Patient Details  Name: Judith [name] MRN: [id number] Date of Birth: [birth year]  Today's Date: 03/11/2012 Time: 0930-1015 and 1130-1200 Time Calculation (min): 45 min and 30 min  Short Term Goals: Week 1:  PT Short Term Goal 1 (Week 1): Patient will be able to perform bed mobility with S/min-A (has hospital bed at home) PT Short Term Goal 1 - Progress (Week 1): Partly met PT Short Term Goal 2 (Week 1): Patient will be able to perform transfers with Mod-Assist  PT Short Term Goal 2 - Progress (Week 1): Met PT Short Term Goal 3 (Week 1): Patient will be able to perform wheelchair mobility and management x 150' with S/Independent assist. PT Short Term Goal 3 - Progress (Week 1): Partly met  Skilled Therapeutic Interventions/Progress Updates:    First Session: Patient received sitting in wheelchair. Today's session focused on wheelchair mobility and squat pivot transfers. Patient performed 2 sets x10 reps of sit<>squat with emphasis on anterior weight shift, pushing up with B UEs, and L knee extension once lifted from wheelchair. Patient benefits from tactile cues for L knee extension.  Patient performed block practice x4 wheelchair<>mat transfers with max assist. Patient demonstrates good carry over from sit<>squat exercise to assist with squat pivot transfers.  Patient returned to room and left seated in wheelchair with all needs within reach.  Second Session: Patient received supine in bed. Patient refuses to get out of bed and states "I'm going to have to get back in bed for lunch." Patient agreeable to exercises in bed. Patient performed LE there ex in bed: glut sets with 3" hold, SLRs, B hip abd, B hip add with 3" pillow squeeze, B knee flexion, L ankle pumps, single leg bridges, all 2 sets x15 reps.  Patient left supine in bed with bed alarm on and all needs within reach.  Therapy Documentation Precautions:  Precautions Precautions: Fall Precaution  Comments: R BKA Restrictions Weight Bearing Restrictions: Yes RLE Weight Bearing: Non weight bearing Other Position/Activity Restrictions: NWB R LE Pain: Pain Assessment Pain Assessment: No/denies pain Pain Score: 0-No pain Locomotion : Wheelchair Mobility Wheelchair Mobility: Yes Wheelchair Assistance: 5: Supervision;Other (comment) (increased time to complete) Wheelchair Assistance Details: Verbal cues for precautions/safety;Verbal cues for sequencing;Verbal cues for technique Wheelchair Propulsion: Both upper extremities Wheelchair Parts Management: Needs assistance Distance: 215   See FIM for current functional status  Therapy/Group: Individual Therapy  Chipper Herb. Traveion Ruddock, PT, DPT  03/11/2012, 11:43 AM

## 2012-03-12 ENCOUNTER — Inpatient Hospital Stay (HOSPITAL_COMMUNITY): Payer: Medicare Other | Admitting: Physical Therapy

## 2012-03-12 LAB — GLUCOSE, CAPILLARY
Glucose-Capillary: 183 mg/dL — ABNORMAL HIGH (ref 70–99)
Glucose-Capillary: 312 mg/dL — ABNORMAL HIGH (ref 70–99)
Glucose-Capillary: 176 mg/dL — ABNORMAL HIGH (ref 70–99)

## 2012-03-12 MED ORDER — INSULIN ASPART 100 UNIT/ML ~~LOC~~ SOLN
0.0000 [IU] | Freq: Two times a day (BID) | SUBCUTANEOUS | Status: DC
  Administered 2012-03-12: 7 [IU] via SUBCUTANEOUS
  Administered 2012-03-13 – 2012-03-14 (×3): 2 [IU] via SUBCUTANEOUS
  Administered 2012-03-14: 5 [IU] via SUBCUTANEOUS
  Administered 2012-03-15: 3 [IU] via SUBCUTANEOUS

## 2012-03-12 NOTE — Progress Notes (Signed)
Patient ID: Judith [name], female   DOB: 1940-09-29, 72 y.o.   MRN: [id number] PSubjective/Complaints: 72 y.o. right-handed female with history of insulin-dependent diabetes mellitus as well as right below-knee amputation 2010 and uses a prosthesis provided by [employer] as well as rolling walker. Patient is a resident of [facility]. Admitted 02/29/2012 after a recent fall at home without loss of consciousness. She was taken to the emergency room with findings of mildly displaced fracture of the distal femur placed in a splint maintain nonweightbearing status and discharged home. She presented to the orthopedic office for followup x-rays repeated showing a displaced right distal femoral fracture. Underwent ORIF 03/01/2012 per Dr. Carola Frost. She remains nonweightbearing x6 weeks  Patient denies any complaints. She denies headache, chest pain, shortness of breath. I reviewed blood sugars with the nurse. Note hypoglycemia yesterday. ROS Patient denies chest pain, shortness of breath, PND. She denies any musculoskeletal complaints.  Objective: Vital Signs: Blood pressure 144/78, pulse 79, temperature 98.8 F (37.1 C), temperature source Oral, resp. rate 18, height 5\' 4"  (1.626 m), weight 199 lb 3.2 oz (90.357 kg), SpO2 98.00%.  Elderly female in no acute distress. HEENT exam: Atraumatic, normocephalic. Chest without increased work of breathing. Clear to auscultation. Cardiac exam S1-S2 are regular. Abdominal exam active bowel sounds, soft. Extremities no edema the right leg. She status post left BKA. Neurologic exam she is alert. Seems to be oriented.  CBG (last 3)   Recent Labs  03/11/12 1756 03/11/12 2037 03/12/12 0723  GLUCAP 188* 354* 183*     Assessment/Plan: 1. Functional deficits secondary to R supracondylar femur fracture in a pt with R BKA, NWB x 6 weeks   Medical Problem List and Plan:  1. Right supracondylar femur fracture status post ORIF 03/01/2012  -remove staples today 2. DVT Prophylaxis/Anticoagulation: Subcutaneous Lovenox. Monitor platelet counts any signs of bleeding  3. Pain Management: Hydrocodone and Robaxin as needed. Monitor with increased mobility.  4. Neuropsych: This patient is capable of making decisions on his/her own behalf.  5. Postoperative anemia. Latest hemoglobin 8.6. Followup CBC. Patient is asymptomatic  6. History right below-knee amputation 2010. Patient does have a prosthesis per [employer]  7. Diabetes mellitus with peripheral neuropathy. uncontrolledHemoglobin A1c 8.0.blood sugar seems to be elevated in the mornings and significantly decreased in the late afternoons. I have adjusted his insulin dosing. See orders. I have changed to sliding scale insulin to breakfast and dinner only. 8. Hypertension. Clonidine 0.05 mg daily. Monitor with increased activity  9. Preadmission Klebsiella urine tract infection. Has completed course of cephalexin. 10. Hyperlipidemia. Zocor  LOS (Days) 8 A FACE TO FACE EVALUATION WAS PERFORMED  Ka Flammer HENRY 03/12/2012, 8:56 AM

## 2012-03-12 NOTE — Progress Notes (Signed)
Physical Therapy Session Note  Patient Details  Name: Judith [name] MRN: [id number] Date of Birth: 06-Oct-1940  Today's Date: 03/12/2012 Time: 4098-1191 Time Calculation (min): 45 min  Therapy Documentation Precautions:  Precautions Precautions: Fall Precaution Comments: R BKA Restrictions Weight Bearing Restrictions: Yes RLE Weight Bearing: Non weight bearing Other Position/Activity Restrictions: NWB R LE Pain:  Denies pain  Therapeutic Exercise:(15') Left LE 5# ankle weight with knee extensions and hip flexion  Therapeutic Activity:(15') Transfer w/c->bed via slideboard with min-Assist, Bed mobility with S/min-Assist Wheelchair Management:(15') propelled w/c 2 x 200' S/Independent (needing minimal verbal cues for making turns)   Therapy/Group: Individual Therapy  Rex Kras 03/12/2012, 3:38 PM

## 2012-03-13 ENCOUNTER — Inpatient Hospital Stay (HOSPITAL_COMMUNITY): Payer: Medicare Other | Admitting: *Deleted

## 2012-03-13 LAB — GLUCOSE, CAPILLARY
Glucose-Capillary: 231 mg/dL — ABNORMAL HIGH (ref 70–99)
Glucose-Capillary: 190 mg/dL — ABNORMAL HIGH (ref 70–99)

## 2012-03-13 MED ORDER — INSULIN DETEMIR 100 UNIT/ML ~~LOC~~ SOLN
16.0000 [IU] | Freq: Two times a day (BID) | SUBCUTANEOUS | Status: DC
  Administered 2012-03-13 – 2012-03-23 (×20): 16 [IU] via SUBCUTANEOUS
  Filled 2012-03-13: qty 10

## 2012-03-13 NOTE — Progress Notes (Signed)
Occupational Therapy Note  Patient Details  Name: Judith [name] MRN: [id number] Date of Birth: 1940/11/23 Today's Date: 03/13/2012 1100-1200  (60 min) Pain:  None Individual session  Pt seen for 1:1 OT session with focus on bed mobility, rolling side to side and sidelying to sitting.  Pt washed periaria in supine with rolling technique and left foot.  She donned pants in rolling as well.for LB dressing. Pt completed bed mobility with supervision with head of bed lowered. Donned sock and shoe (including fastening shoe) sitting EOB with supervision. Slide board transfers from bed ->WC with  Min A and VC for weight bearing through LLE.   Pt. Was placed in her wc from home and nurse reported going to the bed from the low wc is very difficult and takes nursing 3 people.      Humberto Seals 03/13/2012, 11:24 AM

## 2012-03-13 NOTE — Progress Notes (Signed)
Patient ID: Judith [name], female   DOB: 02/18/1940, 72 y.o.   MRN: [id number] PSubjective/Complaints: 72 y.o. right-handed female with history of insulin-dependent diabetes mellitus as well as right below-knee amputation 2010 and uses a prosthesis provided by [employer] as well as rolling walker. Patient is a resident of [facility]. Admitted 02/29/2012 after a recent fall at home without loss of consciousness. She was taken to the emergency room with findings of mildly displaced fracture of the distal femur placed in a splint maintain nonweightbearing status and discharged home. She presented to the orthopedic office for followup x-rays repeated showing a displaced right distal femoral fracture. Underwent ORIF 03/01/2012 per Dr. Carola Frost. She remains nonweightbearing x6 weeks  Patient denies any complaints. She denies headache, chest pain, shortness of breath. I reviewed blood sugars with the nurse. She denies significant pain at the surgical site. Yesterday I stopped lunchtime insulin. Blood sugars now elevated.  ROS Patient denies chest pain, shortness of breath, PND. She denies any musculoskeletal complaints.  Objective: Vital Signs: Blood pressure 110/58, pulse 78, temperature 98.2 F (36.8 C), temperature source Oral, resp. rate 18, height 5\' 4"  (1.626 m), weight 199 lb 3.2 oz (90.357 kg), SpO2 95.00%.  Elderly female in no acute distress. HEENT exam: Atraumatic, normocephalic. Chest without increased work of breathing. Clear to auscultation. Cardiac exam S1-S2 are regular. Abdominal exam active bowel sounds, soft. Extremities no edema the right leg. She status post left BKA. Neurologic exam she is alert. Seems to be oriented.  CBG (last 3)   Recent Labs  03/12/12 1639 03/12/12 2024 03/13/12 0802  GLUCAP 312* 176* 179*     Assessment/Plan: 1. Functional deficits secondary to R supracondylar femur fracture in a pt with R BKA, NWB x 6 weeks   Medical  Problem List and Plan:  1. Right supracondylar femur fracture status post ORIF 03/01/2012 -remove staples today 2. DVT Prophylaxis/Anticoagulation: Subcutaneous Lovenox. Monitor platelet counts any signs of bleeding  3. Pain Management: Hydrocodone and Robaxin as needed. Monitor with increased mobility.  4. Neuropsych: This patient is capable of making decisions on his/her own behalf.  5. Postoperative anemia. Latest hemoglobin 8.6. Followup CBC. Patient is asymptomatic  6. History right below-knee amputation 2010. Patient does have a prosthesis per [employer]  7. Diabetes mellitus with peripheral neuropathy. uncontrolledHemoglobin A1c 8.0.blood sugar seems to be elevated in the mornings and significantly decreased in the late afternoons. I have adjusted his insulin dosing. See orders. Will increase levemir. 8. Hypertension. Clonidine 0.05 mg daily. Monitor with increased activity  9. Preadmission Klebsiella urine tract infection. Has completed course of cephalexin. 10. Hyperlipidemia. Zocor  LOS (Days) 9 A FACE TO FACE EVALUATION WAS PERFORMED  Aleece Loyd HENRY 03/13/2012, 8:15 AM

## 2012-03-14 ENCOUNTER — Inpatient Hospital Stay (HOSPITAL_COMMUNITY): Payer: Medicare Other | Admitting: *Deleted

## 2012-03-14 ENCOUNTER — Inpatient Hospital Stay (HOSPITAL_COMMUNITY): Payer: Medicare Other | Admitting: Occupational Therapy

## 2012-03-14 ENCOUNTER — Inpatient Hospital Stay (HOSPITAL_COMMUNITY): Payer: Medicare Other | Admitting: Physical Therapy

## 2012-03-14 LAB — GLUCOSE, CAPILLARY
Glucose-Capillary: 267 mg/dL — ABNORMAL HIGH (ref 70–99)
Glucose-Capillary: 101 mg/dL — ABNORMAL HIGH (ref 70–99)

## 2012-03-14 MED ORDER — GLIPIZIDE 2.5 MG HALF TABLET
2.5000 mg | ORAL_TABLET | Freq: Every day | ORAL | Status: DC
  Administered 2012-03-15 – 2012-03-18 (×4): 2.5 mg via ORAL
  Filled 2012-03-14 (×5): qty 1

## 2012-03-14 NOTE — Progress Notes (Signed)
Occupational Therapy Session Note  Patient Details  Name: Judith [name] MRN: [id number] Date of Birth: March 14, 1940  Today's Date: 03/14/2012 Time: 0732-0815 Time Calculation (min): 43 min  Short Term Goals: Week 2:  OT Short Term Goal 1 (Week 2): Pt will complete bathing with Min A at bed or WC level  OT Short Term Goal 2 (Week 2): Pt with complete LB dressing with Min A at bed or WC level OT Short Term Goal 3 (Week 2): Pt will complete sldie board transfer with Min A and Min VC, including placement of board OT Short Term Goal 4 (Week 2): Pt will complete stand/squat pivot transfer to [facility] with Mod  A OT Short Term Goal 5 (Week 2): Pt will stand for peri area care with Mod A recall sequence for sit<>stand   Skilled Therapeutic Interventions/Progress Updates:    Pt seen for ADL retraining with focus on bed mobility and weight shifting to increase participation and safety with self-care tasks of bathing and dressing. Pt in bed upon arrival. Engaged in perineal care and changing brief at bed level with focus bed mobility and increasing participation in LB bathing. UB bathing and dressing seated at sink. Pants donned in WC using lateral leans. Pt able to thread pants, but requires physical assist to pull up pants. Pt completed foot care and donned sock and shoe using chair placed in front of her to support leg while reaching.    Therapy Documentation Precautions:  Precautions Precautions: Fall Precaution Comments: R BKA Restrictions Weight Bearing Restrictions: Yes RLE Weight Bearing: Non weight bearing Other Position/Activity Restrictions: NWB R LE    Pain: Pain Assessment Pain Assessment: No/denies pain  See FIM for current functional status  Therapy/Group: Individual Therapy  [name] 03/14/2012, 3:10 PM

## 2012-03-14 NOTE — Progress Notes (Signed)
Patient ID: Judith [name], female   DOB: 1940/08/01, 72 y.o.   MRN: [id number] PSubjective/Complaints: 72 y.o. right-handed female with history of insulin-dependent diabetes mellitus as well as right below-knee amputation 2010 and uses a prosthesis provided by [employer] as well as rolling walker. Patient is a resident of [facility]. Admitted 02/29/2012 after a recent fall at home without loss of consciousness. She was taken to the emergency room with findings of mildly displaced fracture of the distal femur placed in a splint maintain nonweightbearing status and discharged home. She presented to the orthopedic office for followup x-rays repeated showing a displaced right distal femoral fracture. Underwent ORIF 03/01/2012 per Dr. Carola Frost. She remains nonweightbearing x6 weeks  No pain c/os, sutures out  Review of Systems  Constitutional: Negative for fever and chills.  Respiratory: Negative for cough and shortness of breath.   Gastrointestinal: Negative for diarrhea and constipation.  Genitourinary: Negative for dysuria.  All other systems reviewed and are negative.    Objective: Vital Signs: Blood pressure 132/70, pulse 82, temperature 98.1 F (36.7 C), temperature source Oral, resp. rate 19, height 5\' 4"  (1.626 m), weight 90.357 kg (199 lb 3.2 oz), SpO2 96.00%. No results found. Results for orders placed during the hospital encounter of 03/04/12 (from the past 72 hour(s))  GLUCOSE, CAPILLARY     Status: Abnormal   Collection Time    03/11/12  8:09 AM      Result Value Range   Glucose-Capillary 126 (*) 70 - 99 mg/dL  GLUCOSE, CAPILLARY     Status: Abnormal   Collection Time    03/11/12 10:59 AM      Result Value Range   Glucose-Capillary 215 (*) 70 - 99 mg/dL  GLUCOSE, CAPILLARY     Status: Abnormal   Collection Time    03/11/12  4:10 PM      Result Value Range   Glucose-Capillary 40 (*) 70 - 99 mg/dL  GLUCOSE, CAPILLARY     Status: Abnormal   Collection Time    03/11/12  4:25 PM      Result Value Range   Glucose-Capillary 35 (*) 70 - 99 mg/dL   Comment 1 Notify RN    GLUCOSE, CAPILLARY     Status: Abnormal   Collection Time    03/11/12  4:54 PM      Result Value Range   Glucose-Capillary 32 (*) 70 - 99 mg/dL   Comment 1 Notify RN    GLUCOSE, CAPILLARY     Status: Abnormal   Collection Time    03/11/12  5:19 PM      Result Value Range   Glucose-Capillary 39 (*) 70 - 99 mg/dL  GLUCOSE, CAPILLARY     Status: Abnormal   Collection Time    03/11/12  5:56 PM      Result Value Range   Glucose-Capillary 188 (*) 70 - 99 mg/dL  GLUCOSE, CAPILLARY     Status: Abnormal   Collection Time    03/11/12  8:37 PM      Result Value Range   Glucose-Capillary 354 (*) 70 - 99 mg/dL   Comment 1 Notify RN    GLUCOSE, CAPILLARY     Status: Abnormal   Collection Time    03/12/12  7:23 AM      Result Value Range   Glucose-Capillary 183 (*) 70 - 99 mg/dL  GLUCOSE, CAPILLARY     Status: Abnormal   Collection Time    03/12/12 11:27 AM  Result Value Range   Glucose-Capillary 299 (*) 70 - 99 mg/dL  GLUCOSE, CAPILLARY     Status: Abnormal   Collection Time    03/12/12  3:34 PM      Result Value Range   Glucose-Capillary 293 (*) 70 - 99 mg/dL  GLUCOSE, CAPILLARY     Status: Abnormal   Collection Time    03/12/12  4:39 PM      Result Value Range   Glucose-Capillary 312 (*) 70 - 99 mg/dL  GLUCOSE, CAPILLARY     Status: Abnormal   Collection Time    03/12/12  8:24 PM      Result Value Range   Glucose-Capillary 176 (*) 70 - 99 mg/dL   Comment 1 Notify RN    GLUCOSE, CAPILLARY     Status: Abnormal   Collection Time    03/13/12  8:02 AM      Result Value Range   Glucose-Capillary 179 (*) 70 - 99 mg/dL  GLUCOSE, CAPILLARY     Status: Abnormal   Collection Time    03/13/12 11:30 AM      Result Value Range   Glucose-Capillary 231 (*) 70 - 99 mg/dL  GLUCOSE, CAPILLARY     Status: Abnormal   Collection Time    03/13/12  4:44 PM       Result Value Range   Glucose-Capillary 190 (*) 70 - 99 mg/dL  GLUCOSE, CAPILLARY     Status: Abnormal   Collection Time    03/13/12  9:30 PM      Result Value Range   Glucose-Capillary 215 (*) 70 - 99 mg/dL   Comment 1 Notify RN     Comment 2 Documented in Chart    GLUCOSE, CAPILLARY     Status: Abnormal   Collection Time    03/14/12  7:28 AM      Result Value Range   Glucose-Capillary 173 (*) 70 - 99 mg/dL   Comment 1 Notify RN       HEENT: normal Cardio: RRR Resp: CTA B/L GI: BS positive Extremity:  No Edema Skin:   Wound C/D/I and Other Left heel normal Neuro: Alert/Oriented, Abnormal Sensory reduced sensation in finger tips and below ankle LLE, Normal Motor and Other L foot and bilateral hand intrinsic atrophy Musc/Skel:  Other R BKA stump well healed mild edema Gen: NAD   Assessment/Plan: 1. Functional deficits secondary to R supracondylar femur fracture in a pt with R BKA, NWB x 6 weeks which require 3+ hours per day of interdisciplinary therapy in a comprehensive inpatient rehab setting. Physiatrist is providing close team supervision and 24 hour management of active medical problems listed below. Physiatrist and rehab team continue to assess barriers to discharge/monitor patient progress toward functional and medical goals. FIM: FIM - Bathing Bathing Steps Patient Completed: Chest;Right Arm;Left Arm;Abdomen;Front perineal area;Buttocks;Right upper leg;Left upper leg;Left lower leg (including foot) Bathing: 4: Min-Patient completes 8-9 23f 10 parts or 75+ percent  FIM - Upper Body Dressing/Undressing Upper body dressing/undressing steps patient completed: Thread/unthread right sleeve of pullover shirt/dresss;Thread/unthread left sleeve of pullover shirt/dress;Put head through opening of pull over shirt/dress;Pull shirt over trunk Upper body dressing/undressing: 5: Set-up assist to: Obtain clothing/put away FIM - Lower Body Dressing/Undressing Lower body  dressing/undressing steps patient completed: Thread/unthread right pants leg;Thread/unthread left pants leg;Pull pants up/down;Don/Doff left sock;Don/Doff left shoe;Fasten/unfasten left shoe Lower body dressing/undressing: 5: Set-up assist to: Obtain clothing  FIM - Toileting Toileting: 1: Two helpers  FIM - Archivist  Transfers Assistive Devices: Bedside commode;Sliding board Toilet Transfers: 0-Activity did not occur  FIM - Banker Devices: Arm rests;Sliding board Bed/Chair Transfer: 2: Bed > Chair or W/C: Max A (lift and lower assist);2: Chair or W/C > Bed: Max A (lift and lower assist)  FIM - Locomotion: Wheelchair Distance: 215 Locomotion: Wheelchair: 5: Travels 150 ft or more: maneuvers on rugs and over door sills with supervision, cueing or coaxing FIM - Locomotion: Ambulation Ambulation/Gait Assistance: Not tested (comment) Locomotion: Ambulation: 0: Activity did not occur  Comprehension Comprehension Mode: Auditory Comprehension: 5-Follows basic conversation/direction: With extra time/assistive device  Expression Expression Mode: Verbal Expression: 4-Expresses basic 75 - 89% of the time/requires cueing 10 - 24% of the time. Needs helper to occlude trach/needs to repeat words.  Social Interaction Social Interaction: 4-Interacts appropriately 75 - 89% of the time - Needs redirection for appropriate language or to initiate interaction.  Problem Solving Problem Solving: 4-Solves basic 75 - 89% of the time/requires cueing 10 - 24% of the time  Memory Memory: 5-Recognizes or recalls 90% of the time/requires cueing < 10% of the time   Medical Problem List and Plan:  1. Right supracondylar femur fracture status post ORIF 03/01/2012 -remove staples today 2. DVT Prophylaxis/Anticoagulation: Subcutaneous Lovenox. Monitor platelet counts any signs of bleeding  3. Pain Management: Hydrocodone and Robaxin as needed. Monitor  with increased mobility.  4. Neuropsych: This patient is capable of making decisions on his/her own behalf.  5. Postoperative anemia. Latest hemoglobin 8.6. Followup CBC. Patient is asymptomatic  6. History right below-knee amputation 2010. Patient does have a prosthesis per [facility]  7. Diabetes mellitus with peripheral neuropathy. uncontrolledHemoglobin A1c 8.0.Increase Levemir 16 Unit BID Units for high am CBG ,. Titrate regimen as needed. restart glucotrol.  Am CBG still elevated Change SSI to moderate 8. Hypertension. Clonidine 0.05 mg daily. Monitor with increased activity   9. Hyperlipidemia. Zocor  LOS (Days) 10 A FACE TO FACE EVALUATION WAS PERFORMED  Richar Dunklee E 03/14/2012, 8:05 AM

## 2012-03-14 NOTE — Progress Notes (Signed)
Occupational Therapy Session Note  Patient Details  Name: Judith FEHRENBACH MRN: [id number] Date of Birth: 09-20-1940  Today's Date: 03/14/2012 Time: 4098-1191 Time Calculation (min): 43 min  Short Term Goals: Week 2:  OT Short Term Goal 1 (Week 2): Pt will complete bathing with Min A at bed or WC level  OT Short Term Goal 2 (Week 2): Pt with complete LB dressing with Min A at bed or WC level OT Short Term Goal 3 (Week 2): Pt will complete sldie board transfer with Min A and Min VC, including placement of board OT Short Term Goal 4 (Week 2): Pt will complete stand/squat pivot transfer to [facility] with Mod  A OT Short Term Goal 5 (Week 2): Pt will stand for peri area care with Mod A recall sequence for sit<>stand   Skilled Therapeutic Interventions/Progress Updates:    Pt seen for 1:1 OT session with focus on increasing participation in sit <>stand, slide board and squat pivot transfers. Pt seated in WC upon arrival and reported need to use BSC. Squat pivot transfer with Mod A to BSC, with lateral deans to doff brief and pants. Sit <>stand X3 with Mod-Max A with second helper to pull up brief and pants. Pt education to increase participation in slide board transfers. Pt completed slide board transfer, including board placement from [facility] <> bed with VC for safe board and hand placement (Mod A to bed due to uphill angle, and steady assist to [facility]). Squat pivot transfer to return to bed with Mod A.   Therapy Documentation Precautions:  Precautions Precautions: Fall Precaution Comments: R BKA Restrictions Weight Bearing Restrictions: Yes RLE Weight Bearing: Non weight bearing Other Position/Activity Restrictions: NWB R LE    Pain: Pain Assessment Pain Assessment: No/denies pain   Therapy/Group: Individual Therapy  [name] 03/14/2012, 3:02 PM

## 2012-03-14 NOTE — Progress Notes (Signed)
Physical Therapy Session Note  Patient Details  Name: Judith [name] MRN: [id number] Date of Birth: February 17, 1940  Today's Date: 03/14/2012 Time: 1100-1200 Time Calculation (min): 60 min  Short Term Goals: Week 2:  PT Short Term Goal 1 (Week 2): = LTG min A overall w/c level   Skilled Therapeutic Interventions/Progress Updates:   Nursing reporting over the weekend that patient continues to require +3 assist to slideboard from w/c > bed secondary to low STF of w/c; higher w/c was brought in over the weekend but seat width is not appropriate for patient.  Patient performed level slideboard transfers mat <> w/c with min A with assistance to place board and verbal and tactile cues to assist with appropriate anterior lean and trunk rotation to fully advance buttocks.  Patient performed sit <> supine on flat mat and rolling supine > L side with supervision-min A overall.  While supine on mat patient performed bilat LE strengthening exercises with resistance of body weight and gravity only during 12 reps each: glute sets, bilat SAQ, R HS curl, R hip ABD, bilat hip IR, LLE bridges, sidelying RLE hip extension with supervision.  While patient performed exercises adjusted patient's w/c height to 17 STF + cushion and added padding to brake handles.  Patient performed w/c mobility x 150' with supervision with improved UE propulsion sequence and efficiency.  Notified nursing staff that w/c STF has been adjusted and to report back to therapy if patient requires less assistance with transfers.  If not, may recommend that patient's manual w/c be changed out for 19 STF w/c.    Therapy Documentation Precautions:  Precautions Precautions: Fall Precaution Comments: R BKA Restrictions Weight Bearing Restrictions: Yes RLE Weight Bearing: Non weight bearing Other Position/Activity Restrictions: NWB R LE Pain: Pain Assessment Pain Assessment: No/denies pain Pain Score: 0-No pain  See FIM for current functional  status  Therapy/Group: Individual Therapy  Edman Circle Faucette 03/14/2012, 12:12 PM

## 2012-03-14 NOTE — Progress Notes (Signed)
Reviewed and agree with the attached treatment note.  Eddy Termine, OTR/L 

## 2012-03-14 NOTE — Progress Notes (Signed)
Reviewed and agree with the attached treatment note.  Keatyn Luck, OTR/L 

## 2012-03-14 NOTE — Progress Notes (Signed)
Physical Therapy Session Note  Patient Details  Name: Judith [name] MRN: [id number] Date of Birth: 05-10-1940  Today's Date: 03/14/2012 Time: 4098-1191 Time Calculation (min): 45 min  Short Term Goals: Week 1:  PT Short Term Goal 1 (Week 1): Patient will be able to perform bed mobility with S/min-A (has hospital bed at home) PT Short Term Goal 1 - Progress (Week 1): Partly met PT Short Term Goal 2 (Week 1): Patient will be able to perform transfers with Mod-Assist  PT Short Term Goal 2 - Progress (Week 1): Met PT Short Term Goal 3 (Week 1): Patient will be able to perform wheelchair mobility and management x 150' with S/Independent assist. PT Short Term Goal 3 - Progress (Week 1): Partly met Week 2:  PT Short Term Goal 1 (Week 2): = LTG min A overall w/c level   Skilled Therapeutic Interventions/Progress Updates:    Patient received sitting in wheelchair. Today's session focused on wheelchair mobility and slide board transfers. Patient instructed in wheelchair mobility 215'x2 with supervision, requires increased time to complete. Patient performed block practice slide board transfers x4 wheelchair<>mat (going to both sides and going to higher surface of mat and lower surface to wheelchair). Patient requires min assist for all transfers, but requires max verbal and tactile cues for proper sequencing and technique. Patient requires increased time to complete. Patient performed LE there ex seated at edge of mat: B knee extension, B hip adduction with ball squeeze x3", B hip flexion x20 reps each.  Patient returned to room and left seated in wheelchair with  all needs within reach.  Therapy Documentation Precautions:  Precautions Precautions: Fall Precaution Comments: R BKA Restrictions Weight Bearing Restrictions: Yes RLE Weight Bearing: Non weight bearing Other Position/Activity Restrictions: NWB R LE Pain: Pain Assessment Pain Assessment: No/denies pain Pain Score: 0-No  pain Locomotion : Ambulation Ambulation: No Ambulation/Gait Assistance: Not tested (comment) Gait Gait: No Stairs / Additional Locomotion Stairs: No Wheelchair Mobility Wheelchair Mobility: Yes Wheelchair Assistance: 5: Supervision;Other (comment) (requires increased time to complete) Wheelchair Assistance Details: Verbal cues for precautions/safety;Verbal cues for sequencing;Verbal cues for technique Wheelchair Propulsion: Both upper extremities Wheelchair Parts Management: Needs assistance Distance: 215   See FIM for current functional status  Therapy/Group: Individual Therapy  Chipper Herb. Dimitri Dsouza, PT, DPT  03/14/2012, 10:52 AM

## 2012-03-15 ENCOUNTER — Inpatient Hospital Stay (HOSPITAL_COMMUNITY): Payer: Medicare Other | Admitting: Occupational Therapy

## 2012-03-15 ENCOUNTER — Inpatient Hospital Stay (HOSPITAL_COMMUNITY): Payer: Medicare Other | Admitting: Physical Therapy

## 2012-03-15 LAB — GLUCOSE, CAPILLARY
Glucose-Capillary: 113 mg/dL — ABNORMAL HIGH (ref 70–99)
Glucose-Capillary: 159 mg/dL — ABNORMAL HIGH (ref 70–99)

## 2012-03-15 NOTE — Progress Notes (Signed)
Physical Therapy Session Note  Patient Details  Name: Judith [name] MRN: [id number] Date of Birth: September 30, 1940  Today's Date: 03/15/2012 Time: 1100-1200 and 4098-1191 Time Calculation (min): 60 min and 30 min  Short Term Goals: Week 1:  PT Short Term Goal 1 (Week 1): Patient will be able to perform bed mobility with S/min-A (has hospital bed at home) PT Short Term Goal 1 - Progress (Week 1): Partly met PT Short Term Goal 2 (Week 1): Patient will be able to perform transfers with Mod-Assist  PT Short Term Goal 2 - Progress (Week 1): Met PT Short Term Goal 3 (Week 1): Patient will be able to perform wheelchair mobility and management x 150' with S/Independent assist. PT Short Term Goal 3 - Progress (Week 1): Partly met Week 2:  PT Short Term Goal 1 (Week 2): = LTG min A overall w/c level   Skilled Therapeutic Interventions/Progress Updates:   Patient in bed after OT session; patient does not remember why staff put her back in bed. Encouraged patient to spend more time OOB for continued strengthening and activity tolerance.  Patient performed bed mobility with HOB elevated and bed rail with min A and slideboard downhill to w/c with min A and assistance for board placement.  Performed UE strengthening and endurance with w/c mobility in controlled environment x 150' with supervision.  Patient performed LE strengthening with LLE w/c propulsion forwards and backwards x 25' with closed chain LAQ<>HS curls with min A and with LLE on Kinetron at 20 cm/sec x 2 minutes x 2 reps for extensor strengthening.  Patient performed sit <> stand from w/c with bilat UE support on rails with +2 A to stand fully but min A to maintain static standing x 1:00 each time with focus on shoulder depression, upright trunk, full hip and knee extension and LLE strengthening with 2 sets x 5 reps mini squats for hip, knee and ankle strengthening.  Patient tolerated well but required frequent rest breaks.    PM session: Patient  performed slideboard transfers w/c <> mat slightly uphill with min A but still requires min-mod verbal and tactile cues for hand placement and anterior/lateral leans for use of head/hips relationship to advance hips across board.  Patient reclined back on mat with LE off edge of mat to allow gradual hip flexor stretching transitioning to prone for increased hip flexor stretch during prone on elbows x 10-15 seconds at a time and during R and LLE glute/hip extensions x 10 reps each LE and during LLE knee flexion/hamstring curls x 10 reps; also performed LLE hip stretch into IR.  Transitioned back to supine and sitting edge of mat with min A.  Performed slideboard back to w/c with min A and verbal cues for hand placement and lateral leaning.     Therapy Documentation Precautions:  Precautions Precautions: Fall Precaution Comments: R BKA Restrictions Weight Bearing Restrictions: Yes RLE Weight Bearing: Non weight bearing Other Position/Activity Restrictions: NWB R LE Vital Signs: Therapy Vitals Pulse Rate: 77 BP: 97/64 mmHg Patient Position, if appropriate: Sitting Oxygen Therapy SpO2: 98 % O2 Device: None (Room air) Pain: Pain Assessment Pain Assessment: No/denies pain Locomotion : Wheelchair Mobility Distance: 150 supervision with extra time  See FIM for current functional status  Therapy/Group: Individual Therapy  Edman Circle [facility] 03/15/2012, 12:11 PM

## 2012-03-15 NOTE — Progress Notes (Signed)
Occupational Therapy Session Note  Patient Details  Name: Judith [name] MRN: [id number] Date of Birth: Apr 20, 1940  Today's Date: 03/15/2012 Time: 0730-0828 Time Calculation (min): 58 min  Short Term Goals: Week 2:  OT Short Term Goal 1 (Week 2): Pt will complete bathing with Min A at bed or WC level  OT Short Term Goal 2 (Week 2): Pt with complete LB dressing with Min A at bed or WC level OT Short Term Goal 3 (Week 2): Pt will complete sldie board transfer with Min A and Min VC, including placement of board OT Short Term Goal 4 (Week 2): Pt will complete stand/squat pivot transfer to [facility] with Mod  A OT Short Term Goal 5 (Week 2): Pt will stand for peri area care with Mod A recall sequence for sit<>stand   Skilled Therapeutic Interventions/Progress Updates:    Pt seen for ADL retraining with focus on bed mobility, transfers, and sit <> stand for LB dressing.  Pt completed peri area hygiene at bed level with rolling Rt and Lt with setup assist and physical assistance to don brief.  Stand by assist with supine to sit with use of bed rails (pt has hospital bed at home).  Pt with increased carryover of hand placement with slide board transfer from bed to w/c with only min assist with transfer this session and min cues for weight shifting.  Engaged in LB dressing with pt donning Rt and Lt leg and then requiring physical assistance to pull up pants in standing.  Max assist sit <> stand at sink with verbal cues for hand placement and tactile cues at hips for forward weight shifting, pt able to maintain standing position ~20 seconds while this therapist pulled up pants.  Pt donned Lt sock and shoe in sitting at EOB with crossing Lt leg over Rt knee and increased time.  Therapy Documentation Precautions:  Precautions Precautions: Fall Precaution Comments: R BKA Restrictions Weight Bearing Restrictions: Yes RLE Weight Bearing: Non weight bearing Other Position/Activity Restrictions: NWB R  LE General:   Vital Signs: Therapy Vitals Temp: 98.6 F (37 C) Temp src: Oral Pulse Rate: 77 Resp: 19 BP: 97/64 mmHg Patient Position, if appropriate: Sitting Oxygen Therapy SpO2: 98 % O2 Device: None (Room air) Pain: Pain Assessment Pain Assessment: No/denies pain  See FIM for current functional status  Therapy/Group: Individual Therapy  Leonette Monarch 03/15/2012, 8:30 AM

## 2012-03-15 NOTE — Progress Notes (Signed)
Occupational Therapy Session Note  Patient Details  Name: VEIDA SPIRA MRN: [id number] Date of Birth: February 14, 1940  Today's Date: 03/15/2012 Time: 1330-1415 Time Calculation (min): 45 min  Short Term Goals: Week 2:  OT Short Term Goal 1 (Week 2): Pt will complete bathing with Min A at bed or WC level  OT Short Term Goal 2 (Week 2): Pt with complete LB dressing with Min A at bed or WC level OT Short Term Goal 3 (Week 2): Pt will complete sldie board transfer with Min A and Min VC, including placement of board OT Short Term Goal 4 (Week 2): Pt will complete stand/squat pivot transfer to [facility] with Mod  A OT Short Term Goal 5 (Week 2): Pt will stand for peri area care with Mod A recall sequence for sit<>stand   Skilled Therapeutic Interventions/Progress Updates:    Pt seen for 1:1 OT session with focus on sit<>stand, slide board transfers and weight shifting to increase participation in LB dressing. Slide board transfers WC<>mat with focus on reinforcement of pt education on safe hand placement, board placement and sequencing steps to increase participation in slide board transfers. Pt required Mod A when transferring to an uphill angle, but required only assistance with board placement when returning to chair. Pt simulated donning pants (theraband loop) X 2 using lateral leans. Pt able to don pants seated on mat table, but unable in WC due to arm rests. Pt stood outside parallel bars X 2 with Max A and let go of bar with Rt hand in preparation for pulling up pants in standing.   Therapy Documentation Precautions:  Precautions Precautions: Fall Precaution Comments: R BKA Restrictions Weight Bearing Restrictions: Yes RLE Weight Bearing: Non weight bearing Other Position/Activity Restrictions: NWB R LE   Pain: No pain reported     Therapy/Group: Individual Therapy  [name] 03/15/2012, 3:09 PM

## 2012-03-15 NOTE — Progress Notes (Signed)
Reviewed and agree with the attached treatment note.  Sharnice Bosler, OTR/L 

## 2012-03-15 NOTE — Progress Notes (Signed)
Patient ID: Judith [name], female   DOB: Oct 10, 1940, 72 y.o.   MRN: [id number] PSubjective/Complaints: 72 y.o. right-handed female with history of insulin-dependent diabetes mellitus as well as right below-knee amputation 2010 and uses a prosthesis provided by advanced [employer] as well as rolling walker. Patient is a resident of [facility]. Admitted 02/29/2012 after a recent fall at home without loss of consciousness. She was taken to the emergency room with findings of mildly displaced fracture of the distal femur placed in a splint maintain nonweightbearing status and discharged home. She presented to the orthopedic office for followup x-rays repeated showing a displaced right distal femoral fracture. Underwent ORIF 03/01/2012 per Dr. Carola Frost. She remains nonweightbearing x6 weeks  No pain c/os, sutures out, stood at sink, pulled up pants  Review of Systems  Constitutional: Negative for fever and chills.  Respiratory: Negative for cough and shortness of breath.   Gastrointestinal: Negative for diarrhea and constipation.  Genitourinary: Negative for dysuria.  All other systems reviewed and are negative.    Objective: Vital Signs: Blood pressure 126/53, pulse 76, temperature 98.6 F (37 C), temperature source Oral, resp. rate 19, height 5\' 4"  (1.626 m), weight 90.357 kg (199 lb 3.2 oz), SpO2 97.00%. No results found. Results for orders placed during the hospital encounter of 03/04/12 (from the past 72 hour(s))  GLUCOSE, CAPILLARY     Status: Abnormal   Collection Time    03/12/12 11:27 AM      Result Value Range   Glucose-Capillary 299 (*) 70 - 99 mg/dL  GLUCOSE, CAPILLARY     Status: Abnormal   Collection Time    03/12/12  3:34 PM      Result Value Range   Glucose-Capillary 293 (*) 70 - 99 mg/dL  GLUCOSE, CAPILLARY     Status: Abnormal   Collection Time    03/12/12  4:39 PM      Result Value Range   Glucose-Capillary 312 (*) 70 - 99 mg/dL  GLUCOSE,  CAPILLARY     Status: Abnormal   Collection Time    03/12/12  8:24 PM      Result Value Range   Glucose-Capillary 176 (*) 70 - 99 mg/dL   Comment 1 Notify RN    GLUCOSE, CAPILLARY     Status: Abnormal   Collection Time    03/13/12  8:02 AM      Result Value Range   Glucose-Capillary 179 (*) 70 - 99 mg/dL  GLUCOSE, CAPILLARY     Status: Abnormal   Collection Time    03/13/12 11:30 AM      Result Value Range   Glucose-Capillary 231 (*) 70 - 99 mg/dL  GLUCOSE, CAPILLARY     Status: Abnormal   Collection Time    03/13/12  4:44 PM      Result Value Range   Glucose-Capillary 190 (*) 70 - 99 mg/dL  GLUCOSE, CAPILLARY     Status: Abnormal   Collection Time    03/13/12  9:30 PM      Result Value Range   Glucose-Capillary 215 (*) 70 - 99 mg/dL   Comment 1 Notify RN     Comment 2 Documented in Chart    GLUCOSE, CAPILLARY     Status: Abnormal   Collection Time    03/14/12  7:28 AM      Result Value Range   Glucose-Capillary 173 (*) 70 - 99 mg/dL   Comment 1 Notify RN    GLUCOSE, CAPILLARY  Status: Abnormal   Collection Time    03/14/12 11:52 AM      Result Value Range   Glucose-Capillary 267 (*) 70 - 99 mg/dL   Comment 1 Notify RN    GLUCOSE, CAPILLARY     Status: Abnormal   Collection Time    03/14/12  4:44 PM      Result Value Range   Glucose-Capillary 286 (*) 70 - 99 mg/dL   Comment 1 Notify RN    GLUCOSE, CAPILLARY     Status: Abnormal   Collection Time    03/14/12  8:54 PM      Result Value Range   Glucose-Capillary 101 (*) 70 - 99 mg/dL   Comment 1 Notify RN    GLUCOSE, CAPILLARY     Status: Abnormal   Collection Time    03/15/12  7:12 AM      Result Value Range   Glucose-Capillary 113 (*) 70 - 99 mg/dL   Comment 1 Notify RN       HEENT: normal Cardio: RRR Resp: CTA B/L GI: BS positive Extremity:  No Edema Skin:   Wound C/D/I and Other Left heel normal Neuro: Alert/Oriented, Abnormal Sensory reduced sensation in finger tips and below ankle LLE, Normal  Motor and Other L foot and bilateral hand intrinsic atrophy Musc/Skel:  Other R BKA stump well healed mild edema Gen: NAD   Assessment/Plan: 1. Functional deficits secondary to R supracondylar femur fracture in a pt with R BKA, NWB x 6 weeks which require 3+ hours per day of interdisciplinary therapy in a comprehensive inpatient rehab setting. Physiatrist is providing close team supervision and 24 hour management of active medical problems listed below. Physiatrist and rehab team continue to assess barriers to discharge/monitor patient progress toward functional and medical goals. FIM: FIM - Bathing Bathing Steps Patient Completed: Chest;Right Arm;Left Arm;Abdomen;Front perineal area;Buttocks;Right upper leg;Left upper leg Bathing: 4: Min-Patient completes 8-9 80f 10 parts or 75+ percent  FIM - Upper Body Dressing/Undressing Upper body dressing/undressing steps patient completed: Thread/unthread right bra strap;Thread/unthread left bra strap;Hook/unhook bra;Thread/unthread right sleeve of pullover shirt/dresss;Thread/unthread left sleeve of pullover shirt/dress;Put head through opening of pull over shirt/dress;Pull shirt over trunk Upper body dressing/undressing: 5: Set-up assist to: Obtain clothing/put away FIM - Lower Body Dressing/Undressing Lower body dressing/undressing steps patient completed: Thread/unthread right pants leg;Thread/unthread left pants leg;Don/Doff left sock;Don/Doff left shoe;Fasten/unfasten left shoe Lower body dressing/undressing: 4: Min-Patient completed 75 plus % of tasks  FIM - Toileting Toileting steps completed by patient: Performs perineal hygiene Toileting: 1: Two helpers  FIM - Diplomatic Services operational officer Devices: Psychiatrist Transfers: 3-To toilet/BSC: Mod A (lift or lower assist);3-From toilet/BSC: Mod A (lift or lower assist)  FIM - Banker Devices: Sliding board Bed/Chair Transfer: 4:  Supine > Sit: Min A (steadying Pt. > 75%/lift 1 leg);4: Bed > Chair or W/C: Min A (steadying Pt. > 75%)  FIM - Locomotion: Wheelchair Distance: 150 Locomotion: Wheelchair: 5: Travels 150 ft or more: maneuvers on rugs and over door sills with supervision, cueing or coaxing FIM - Locomotion: Ambulation Ambulation/Gait Assistance: Not tested (comment) Locomotion: Ambulation: 0: Activity did not occur  Comprehension Comprehension Mode: Auditory Comprehension: 4-Understands basic 75 - 89% of the time/requires cueing 10 - 24% of the time  Expression Expression Mode: Verbal Expression: 3-Expresses basic 50 - 74% of the time/requires cueing 25 - 50% of the time. Needs to repeat parts of sentences.  Social Interaction Social Interaction: 3-Interacts appropriately 50 -  74% of the time - May be physically or verbally inappropriate.  Problem Solving Problem Solving: 2-Solves basic 25 - 49% of the time - needs direction more than half the time to initiate, plan or complete simple activities  Memory Memory: 3-Recognizes or recalls 50 - 74% of the time/requires cueing 25 - 49% of the time   Medical Problem List and Plan:  1. Right supracondylar femur fracture status post ORIF 03/01/2012 -2. DVT Prophylaxis/Anticoagulation: Subcutaneous Lovenox. Monitor platelet counts any signs of bleeding  3. Pain Management: Hydrocodone and Robaxin as needed. Monitor with increased mobility.  4. Neuropsych: This patient is capable of making decisions on his/her own behalf.  5. Postoperative anemia. Latest hemoglobin 8.6. Followup CBC. Patient is asymptomatic  6. History right below-knee amputation 2010. Patient does have a prosthesis per [employer]  7. Diabetes mellitus with peripheral neuropathy. uncontrolledHemoglobin A1c 8.0.Increase Levemir 16 Unit BID Units for high am CBG ,. Titrate regimen as needed. restart glucotrol.  Am CBG still elevated Change SSI to moderate 8. Hypertension. Clonidine  0.05 mg daily. Monitor with increased activity   9. Hyperlipidemia. Zocor  LOS (Days) 11 A FACE TO FACE EVALUATION WAS PERFORMED  Judith [name] 03/15/2012, 8:13 AM

## 2012-03-16 ENCOUNTER — Inpatient Hospital Stay (HOSPITAL_COMMUNITY): Payer: Medicare Other | Admitting: Physical Therapy

## 2012-03-16 ENCOUNTER — Inpatient Hospital Stay (HOSPITAL_COMMUNITY): Payer: Medicare Other | Admitting: Occupational Therapy

## 2012-03-16 DIAGNOSIS — E1365 Other specified diabetes mellitus with hyperglycemia: Secondary | ICD-10-CM

## 2012-03-16 DIAGNOSIS — E1349 Other specified diabetes mellitus with other diabetic neurological complication: Secondary | ICD-10-CM

## 2012-03-16 LAB — GLUCOSE, CAPILLARY
Glucose-Capillary: 101 mg/dL — ABNORMAL HIGH (ref 70–99)
Glucose-Capillary: 132 mg/dL — ABNORMAL HIGH (ref 70–99)
Glucose-Capillary: 215 mg/dL — ABNORMAL HIGH (ref 70–99)
Glucose-Capillary: 240 mg/dL — ABNORMAL HIGH (ref 70–99)
Glucose-Capillary: 76 mg/dL (ref 70–99)

## 2012-03-16 MED ORDER — INSULIN ASPART 100 UNIT/ML ~~LOC~~ SOLN
0.0000 [IU] | Freq: Three times a day (TID) | SUBCUTANEOUS | Status: DC
  Administered 2012-03-16: 3 [IU] via SUBCUTANEOUS
  Administered 2012-03-16: 1 [IU] via SUBCUTANEOUS
  Administered 2012-03-17 (×2): 3 [IU] via SUBCUTANEOUS
  Administered 2012-03-17: 1 [IU] via SUBCUTANEOUS
  Administered 2012-03-18: 2 [IU] via SUBCUTANEOUS
  Administered 2012-03-19 – 2012-03-21 (×4): 1 [IU] via SUBCUTANEOUS
  Administered 2012-03-21: 5 [IU] via SUBCUTANEOUS
  Administered 2012-03-22: 1 [IU] via SUBCUTANEOUS
  Administered 2012-03-22: 2 [IU] via SUBCUTANEOUS
  Administered 2012-03-23: 1 [IU] via SUBCUTANEOUS

## 2012-03-16 MED ORDER — INSULIN ASPART 100 UNIT/ML ~~LOC~~ SOLN: 0.0000 [IU] | Freq: Three times a day (TID) | SUBCUTANEOUS | Status: DC

## 2012-03-16 NOTE — Progress Notes (Signed)
Reviewed and agree with the attached treatment note.  Breland Trouten, OTR/L 

## 2012-03-16 NOTE — Progress Notes (Signed)
Occupational Therapy Session Note  Patient Details  Name: Judith [name] MRN: [id number] Date of Birth: 16-Jan-1941  Today's Date: 03/16/2012 Time: 1115-1201 Time Calculation (min): 46 min  Short Term Goals: Week 2:  OT Short Term Goal 1 (Week 2): Pt will complete bathing with Min A at bed or WC level  OT Short Term Goal 2 (Week 2): Pt with complete LB dressing with Min A at bed or WC level OT Short Term Goal 3 (Week 2): Pt will complete sldie board transfer with Min A and Min VC, including placement of board OT Short Term Goal 4 (Week 2): Pt will complete stand/squat pivot transfer to [facility] with Mod  A OT Short Term Goal 5 (Week 2): Pt will stand for peri area care with Mod A recall sequence for sit<>stand   Skilled Therapeutic Interventions/Progress Updates:    Pt seen for 1:1 OT session with focus on reinforcement on education to sequence safe slide board transfers, sit<>stand, standing balance and weight shifting to increase participation in LB dressing and bathing tasks. Pt completed slide board transfers WC<>mat (level surface) with physical assistance to place board and VC to facilitate sequencing. Pt completed weight shifting activity seated on mat to simulate lateral leans to don pants in sitting. Pt completed sit<>stand with Max-Mod A X 6, requiring Mod-Min A to maintain standing balance while completing weight shifting and bi-lateral reaching activity in preparation for LB dressing. Pt stood for 30-45 seconds each time. Daughter present during last 10 mins of session, family education on pt's progress.  Therapy Documentation Precautions:  Precautions Precautions: Fall Precaution Comments: R BKA Restrictions Weight Bearing Restrictions: Yes RLE Weight Bearing: Non weight bearing Other Position/Activity Restrictions: NWB R LE    Pain: Pain Assessment Pain Assessment: No/denies pain  See FIM for current functional status  Therapy/Group: Individual Therapy  [name] 03/16/2012, 12:16 PM

## 2012-03-16 NOTE — Progress Notes (Signed)
Social Work Patient ID: Judith [name], female   DOB: 22-Feb-1940, 72 y.o.   MRN: [id number] Met with pt and spoke with pt's daughter in-law-Denise to inform team conference progression toward goals and need to begin family education. Angelique Blonder can be here Monday at 2:00 pm to do actual car transfer since she is the one to transport her.  Her granddaughter's will be here also to learn toilet transfers and other transfers also.  Will work toward discharge 2/26.  Angelique Blonder reports pt will have 24 hour care at home.

## 2012-03-16 NOTE — Progress Notes (Signed)
Patient ID: Judith [name], female   DOB: May 29, 1940, 72 y.o.   MRN: [id number] PSubjective/Complaints: 72 y.o. right-handed femalemale with history of insulin-dependent diabetes mellitus as well as right below-knee amputation 2010 and uses a prosthesis provided by [employer] as well as rolling walker. Patient is a resident of [facility]. Admitted 02/29/2012 after a recent fall at home without loss of consciousness. She was taken to the emergency room with findings of mildly displaced fracture of the distal femur placed in a splint maintain nonweightbearing status and discharged home. She presented to the orthopedic office for followup x-rays repeated showing a displaced right distal femoral fracture. Underwent ORIF 03/01/2012 per Dr. Carola Frost. She remains nonweightbearing x6 weeks    Review of Systems  Constitutional: Negative for fever and chills.  Respiratory: Negative for cough and shortness of breath.   Gastrointestinal: Negative for diarrhea and constipation.  Genitourinary: Negative for dysuria.  All other systems reviewed and are negative.    Objective: Vital Signs: Blood pressure 128/78, pulse 74, temperature 98.1 F (36.7 C), temperature source Oral, resp. rate 17, height 5\' 4"  (1.626 m), weight 90.357 kg (199 lb 3.2 oz), SpO2 97.00%. No results found. Results for orders placed during the hospital encounter of 03/04/12 (from the past 72 hour(s))  GLUCOSE, CAPILLARY     Status: Abnormal   Collection Time    03/13/12 11:30 AM      Result Value Range   Glucose-Capillary 231 (*) 70 - 99 mg/dL  GLUCOSE, CAPILLARY     Status: Abnormal   Collection Time    03/13/12  4:44 PM      Result Value Range   Glucose-Capillary 190 (*) 70 - 99 mg/dL  GLUCOSE, CAPILLARY     Status: Abnormal   Collection Time    03/13/12  9:30 PM      Result Value Range   Glucose-Capillary 215 (*) 70 - 99 mg/dL   Comment 1 Notify RN     Comment 2 Documented in Chart    GLUCOSE,  CAPILLARY     Status: Abnormal   Collection Time    03/14/12  7:28 AM      Result Value Range   Glucose-Capillary 173 (*) 70 - 99 mg/dL   Comment 1 Notify RN    GLUCOSE, CAPILLARY     Status: Abnormal   Collection Time    03/14/12 11:52 AM      Result Value Range   Glucose-Capillary 267 (*) 70 - 99 mg/dL   Comment 1 Notify RN    GLUCOSE, CAPILLARY     Status: Abnormal   Collection Time    03/14/12  4:44 PM      Result Value Range   Glucose-Capillary 286 (*) 70 - 99 mg/dL   Comment 1 Notify RN    GLUCOSE, CAPILLARY     Status: Abnormal   Collection Time    03/14/12  8:54 PM      Result Value Range   Glucose-Capillary 101 (*) 70 - 99 mg/dL   Comment 1 Notify RN    GLUCOSE, CAPILLARY     Status: Abnormal   Collection Time    03/15/12  7:12 AM      Result Value Range   Glucose-Capillary 113 (*) 70 - 99 mg/dL   Comment 1 Notify RN    GLUCOSE, CAPILLARY     Status: Abnormal   Collection Time    03/15/12 12:02 PM      Result Value Range  Glucose-Capillary 159 (*) 70 - 99 mg/dL   Comment 1 Notify RN    GLUCOSE, CAPILLARY     Status: Abnormal   Collection Time    03/15/12  4:47 PM      Result Value Range   Glucose-Capillary 215 (*) 70 - 99 mg/dL   Comment 1 Notify RN    GLUCOSE, CAPILLARY     Status: Abnormal   Collection Time    03/15/12  8:43 PM      Result Value Range   Glucose-Capillary 121 (*) 70 - 99 mg/dL   Comment 1 Notify RN    GLUCOSE, CAPILLARY     Status: Abnormal   Collection Time    03/16/12  7:07 AM      Result Value Range   Glucose-Capillary 116 (*) 70 - 99 mg/dL   Comment 1 Notify RN       HEENT: normal Cardio: RRR Resp: CTA B/L GI: BS positive Extremity:  No Edema Skin:   Wound C/D/I and Other Left heel normal Neuro: Alert/Oriented, Abnormal Sensory reduced sensation in finger tips and below ankle LLE, Normal Motor and Other L foot and bilateral hand intrinsic atrophy Musc/Skel:  Other R BKA stump well healed mild edema Gen:  NAD   Assessment/Plan: 1. Functional deficits secondary to R supracondylar femur fracture in a pt with R BKA, NWB x 6 weeks which require 3+ hours per day of interdisciplinary therapy in a comprehensive inpatient rehab setting. Physiatrist is providing close team supervision and 24 hour management of active medical problems listed below. Physiatrist and rehab team continue to assess barriers to discharge/monitor patient progress toward functional and medical goals. FIM: FIM - Bathing Bathing Steps Patient Completed: Chest;Right Arm;Left Arm;Abdomen;Front perineal area;Buttocks;Right upper leg;Left upper leg;Left lower leg (including foot) Bathing: 4: Min-Patient completes 8-9 15f 10 parts or 75+ percent  FIM - Upper Body Dressing/Undressing Upper body dressing/undressing steps patient completed: Thread/unthread right bra strap;Thread/unthread left bra strap;Hook/unhook bra;Thread/unthread right sleeve of pullover shirt/dresss;Thread/unthread left sleeve of pullover shirt/dress;Put head through opening of pull over shirt/dress;Pull shirt over trunk Upper body dressing/undressing: 5: Supervision: Safety issues/verbal cues FIM - Lower Body Dressing/Undressing Lower body dressing/undressing steps patient completed: Thread/unthread right pants leg;Thread/unthread left pants leg;Don/Doff left sock;Don/Doff left shoe;Fasten/unfasten left shoe Lower body dressing/undressing: 2: Max-Patient completed 25-49% of tasks  FIM - Toileting Toileting steps completed by patient: Performs perineal hygiene Toileting: 1: Two helpers  FIM - Diplomatic Services operational officer Devices: Sliding board;Grab bars Toilet Transfers: 1-Two helpers  FIM - Banker Devices: Arm rests;Bed rails Bed/Chair Transfer: 5: Supine > Sit: Supervision (verbal cues/safety issues);2: Bed > Chair or W/C: Max A (lift and lower assist)  FIM - Locomotion: Wheelchair Distance:  150 Locomotion: Wheelchair: 5: Travels 150 ft or more: maneuvers on rugs and over door sills with supervision, cueing or coaxing FIM - Locomotion: Ambulation Ambulation/Gait Assistance: Not tested (comment) Locomotion: Ambulation: 0: Activity did not occur  Comprehension Comprehension Mode: Auditory Comprehension: 5-Follows basic conversation/direction: With no assist  Expression Expression Mode: Verbal Expression: 5-Expresses basic needs/ideas: With no assist  Social Interaction Social Interaction: 6-Interacts appropriately with others with medication or extra time (anti-anxiety, antidepressant).  Problem Solving Problem Solving: 5-Solves basic problems: With no assist  Memory Memory: 4-Recognizes or recalls 75 - 89% of the time/requires cueing 10 - 24% of the time   Medical Problem List and Plan:  1. Right supracondylar femur fracture status post ORIF 03/01/2012 -2. DVT Prophylaxis/Anticoagulation: Subcutaneous Lovenox. Monitor  platelet counts any signs of bleeding  3. Pain Management: Hydrocodone and Robaxin as needed. Monitor with increased mobility.  4. Neuropsych: This patient is capable of making decisions on his/her own behalf.  5. Postoperative anemia. Latest hemoglobin 8.6. Followup CBC. Patient is asymptomatic  6. History right below-knee amputation 2010. Patient does have a prosthesis per [employer]  7. Diabetes mellitus with peripheral neuropathy. uncontrolledHemoglobin A1c 8.0.Increase Levemir 16 Unit BID Units for high am CBG ,. Titrate regimen as needed. restart glucotrol.  Am CBG improved, only one CBG over 200 yesterday and no hypoglycemia Change SSI to moderate 8. Hypertension. Clonidine 0.05 mg daily. Monitor with increased activity   9. Hyperlipidemia. Zocor  LOS (Days) 12 A FACE TO FACE EVALUATION WAS PERFORMED  KIRSTEINS,ANDREW E 03/16/2012, 9:22 AM

## 2012-03-16 NOTE — Progress Notes (Signed)
Physical Therapy Session Note  Patient Details  Name: Judith [name] MRN: [id number] Date of Birth: 09-03-1940  Today's Date: 03/16/2012 Time: 4098-1191 and 4782-9562 Time Calculation (min): 39 min and 54 min  Short Term Goals: Week 1:  PT Short Term Goal 1 (Week 1): Patient will be able to perform bed mobility with S/min-A (has hospital bed at home) PT Short Term Goal 1 - Progress (Week 1): Partly met PT Short Term Goal 2 (Week 1): Patient will be able to perform transfers with Mod-Assist  PT Short Term Goal 2 - Progress (Week 1): Met PT Short Term Goal 3 (Week 1): Patient will be able to perform wheelchair mobility and management x 150' with S/Independent assist. PT Short Term Goal 3 - Progress (Week 1): Partly met Week 2:  PT Short Term Goal 1 (Week 2): = LTG min A overall w/c level   Skilled Therapeutic Interventions/Progress Updates:   AM treatment session with focus on UE strengthening and endurance for w/c mobility, transfers and standing with 4 minutes forwards and 4 minutes backwards of UE UBE at variable resistances with one rest break to assess HR.  Performed theraband resisted UE one set x 10 reps each: tricep elbow extension kick backs, Lattisimus UE extensions, scap retraction rows, D2 PNF flexion <> extension all against resistance of orange theraband with rest breaks in between sets of exercises.   PM session:  Patient's daughter present; daughter unable to recall when patient received her manual and power w/c and if it was paid for by insurance or out of pocket.  Discussed how patient performing toileting currently.  Attempted to perform sit <> stand from w/c with use of UE support on STEDY to pull to stand and to maintain standing.  With 3 attempts with total lifting assistance patient unable to stand with STEDY.  Patient reporting fatigue this pm.  Performed LLE extensor strengthening on Kinetron seated in w/c at 20 cm/sec x 2 minutes x 2 reps with one rest break.  Also performed  UE strengthening, core strengthening, postural control, dynamic sitting balance and endurance training with therapy ball rebounds on trampoline forwards x 2 minutes and with L and R trunk rotation x 2 minutes each from w/c with no back support and no lateral support.  Patient required frequent rest breaks.  Returned to room and performed slideboard uphill to bed with mod A and verbal and tactile cues for sequencing and sit > supine with min A but required max-total assistance to bridge and reposition in bed.   Therapy Documentation Precautions:  Precautions Precautions: Fall Precaution Comments: R BKA Restrictions Weight Bearing Restrictions: Yes RLE Weight Bearing: Non weight bearing Other Position/Activity Restrictions: NWB R LE Vital Signs: Therapy Vitals Pulse Rate: 80 Patient Position, if appropriate: Sitting Pain: Pain Assessment Pain Assessment: No/denies pain  See FIM for current functional status  Therapy/Group: Individual Therapy  [facility] [address] [facility] 03/16/2012, 10:34 AM

## 2012-03-16 NOTE — Progress Notes (Signed)
Occupational Therapy Session Note  Patient Details  Name: Judith [name] MRN: [id number] Date of Birth: 04-04-1940  Today's Date: 03/16/2012 Time: 7829-5621 Time Calculation (min): 45 min  Short Term Goals: Week 2:  OT Short Term Goal 1 (Week 2): Pt will complete bathing with Min A at bed or WC level  OT Short Term Goal 2 (Week 2): Pt with complete LB dressing with Min A at bed or WC level OT Short Term Goal 3 (Week 2): Pt will complete sldie board transfer with Min A and Min VC, including placement of board OT Short Term Goal 4 (Week 2): Pt will complete stand/squat pivot transfer to [facility] with Mod  A OT Short Term Goal 5 (Week 2): Pt will stand for peri area care with Mod A recall sequence for sit<>stand   Skilled Therapeutic Interventions/Progress Updates:    Pt seen for ADL retraining session with focus on sit<>stand, transfers, and standing balance to increase participation in LB dressing. Pt completed LB bathing with setup at bed level. Squat pivot transfer to [facility] with Max A and VC for safe foot placement and hand placement. Pt completed UB bathing and dressing seated at sink with setup. LB dressing with Max A for sit<>stand to pull up pants. Donned sock and shoe with setup, and chair placed in front of her to support leg while tying shoe  Therapy Documentation Precautions:  Precautions Precautions: Fall Precaution Comments: R BKA Restrictions Weight Bearing Restrictions: Yes RLE Weight Bearing: Non weight bearing Other Position/Activity Restrictions: NWB R LE    Pain: No pain reported this session    See FIM for current functional status  Therapy/Group: Individual Therapy  Judith [name] 03/16/2012, 8:51 AM

## 2012-03-16 NOTE — Progress Notes (Signed)
Reviewed and agree with the attached treatment note.  Madhav Mohon, OTR/L 

## 2012-03-16 NOTE — Progress Notes (Signed)
NUTRITION FOLLOW UP  DOCUMENTATION CODES  Per approved criteria   -Obesity Unspecified    Intervention:   1. Continue Glucerna Shake po daily, each supplement provides 220 kcal and 10 grams of protein, if intake of meals declines.  2. RD to continue to follow nutrition care plan  Nutrition Dx:   Increased nutrient needs related to post-op healing as evidenced by estimated needs. Ongoing.  Goal:   Pt to meet >/= 90% of their estimated nutrition needs. Met.  Monitor:   weight trends, lab trends, I/O's, PO intake, supplement tolerance  Assessment:   S/p BKA in 2010. Admitted for surgery for her supracondylar/intracondylar femoral fracture s/p fall. Pt was seen by RD staff during acute hospitalization. Underwent ORIF 2/4.  Hypoglycemic event on 2/12 and 2/14. Meal intake remains excellent, consuming 100%. Continues with order for Glucerna Shake daily.  Height: Ht Readings from Last 1 Encounters:  03/04/12 5\' 4"  (1.626 m)    Weight Status:   Wt Readings from Last 1 Encounters:  03/04/12 199 lb 3.2 oz (90.357 kg)  No new weight since admission.  Re-estimated needs:  Kcal: 1600 - 1700 Protein: 80 - 90 Fluid: 1.8 - 2  Skin: R thigh incision  Diet Order: Carb Control Medium (1600-2000)   Intake/Output Summary (Last 24 hours) at 03/16/12 1031 Last data filed at 03/16/12 0915  Gross per 24 hour  Intake   1140 ml  Output      0 ml  Net   1140 ml    Last BM: 2/16   Labs:   Recent Labs Lab 03/11/12 0610  CREATININE 0.88    CBG (last 3)   Recent Labs  03/15/12 1647 03/15/12 2043 03/16/12 0707  GLUCAP 215* 121* 116*    Scheduled Meds: . cholecalciferol  1,000 Units Oral Daily  . cloNIDine  0.05 mg Oral Daily  . enoxaparin (LOVENOX) injection  40 mg Subcutaneous Q24H  . feeding supplement  237 mL Oral Daily  . glipiZIDE  2.5 mg Oral QAC breakfast  . insulin aspart  0-9 Units Subcutaneous BID WC  . insulin detemir  16 Units Subcutaneous BID  .  multivitamin with minerals  1 tablet Oral Daily  . pantoprazole  40 mg Oral Daily  . simvastatin  40 mg Oral QPM    Continuous Infusions:  none  Jarold Motto MS, RD, LDN Pager: [phone] After-hours pager: [phone]

## 2012-03-16 NOTE — Patient Care Conference (Signed)
Inpatient RehabilitationTeam Conference and Plan of Care Update Date: 03/16/2012   Time: 10:35 AM    Patient Name: Judith [name]      Medical Record Number: [id number]  Date of Birth: 1940-07-03 Sex: Female         Room/Bed: 4038/4038-01 Payor Info: Payor: MEDICARE  Plan: MEDICARE PART A AND B  Product Type: *No Product type*     Admitting Diagnosis: rt femur fx  Admit Date/Time:  03/04/2012  2:40 PM Admission Comments: No comment available   Primary Diagnosis:  Fracture of femur, distal, right, closed Principal Problem: Fracture of femur, distal, right, closed  Patient Active Problem List   Diagnosis Date Noted  . Acute blood loss anemia 03/03/2012  . Hx of right BKA 03/03/2012  . Fall 03/03/2012  . Obesity (BMI 30-39.9) 03/03/2012  . Poor nutrition 03/03/2012  . Fracture of femur, distal, right, closed 03/01/2012  . UTI (lower urinary tract infection) 03/01/2012  . Insulin dependent diabetes mellitus 03/01/2012  . Hypertension     Expected Discharge Date: Expected Discharge Date: 03/23/12  Team Members Present: Physician leading conference: Dr. Claudette Laws Nurse Present: Laural Roes, RN PT Present: Other (comment);Edman Circle, PT Clarisse Gouge ripa-PT) OT Present: Bretta Bang, Verlene Mayer, OT SLP Present: Fae Pippin, SLP Other (Discipline and Name): Charolette Child Coordinator     Current Status/Progress Goal Weekly Team Focus  Medical   diabetes controlled, wound healing well, occ bladder inc pre existing  Maintain medical stability  Focus on transfers   Bowel/Bladder   incontinent bowel/bladder, wears brief, lbm 2/16  continent bowel/bladder during day and at night if possible with timed toileting  timed toileting   Swallow/Nutrition/ Hydration             ADL's   UB dressing supervision. Bathing supervision at bed level, Max A sit<>stand. LB dressing Min A using lateral leans, Max A sit<>stand. Transfers Max A stand pivot, Mod-Min A with slide board   supervision UB dressing, min assist LB dressing, min assist toileting and toilet transfer  Transfers, sit<>stand, LB dressing   Mobility   min A slideboard, supervision w/c mobility, max-total to stand  supervision-min A overall w/c level, mod A static standing   activity tolerance, UE and LE strength, transfers, static standing   Communication             Safety/Cognition/ Behavioral Observations            Pain   no c/o pain  3 or less 1-10 scale  monitor   Skin   r hip incision healing and intact  no new breakdown  monitor      *See Interdisciplinary Assessment and Plan and progress notes for long and short-term goals  Barriers to Discharge: pt having problems with transfers to uneven surfaces    Possible Resolutions to Barriers:  consider use of power chair which pt has at home but cannot transport to hosp, incease family involvement    Discharge Planning/Teaching Needs:  Have family come in for family education.  Granddaughter's to be with and CAP worker-24 hr care      Team Discussion:  Timed toileting to assist with continency.  Endurance and activity tolerance better.  Pain controlled now, slow progress.  Need to begin hands on family education.  Revisions to Treatment Plan:  None   Continued Need for Acute Rehabilitation Level of Care: The patient requires daily medical management by a physician with specialized training in physical medicine and rehabilitation for the  following conditions: Daily direction of a multidisciplinary physical rehabilitation program to ensure safe treatment while eliciting the highest outcome that is of practical value to the patient.: Yes Daily medical management of patient stability for increased activity during participation in an intensive rehabilitation regime.: Yes Daily analysis of laboratory values and/or radiology reports with any subsequent need for medication adjustment of medical intervention for : Post surgical  problems;Other  Lorieann Argueta, Lemar Livings 03/16/2012, 1:36 PM

## 2012-03-17 ENCOUNTER — Inpatient Hospital Stay (HOSPITAL_COMMUNITY): Payer: Medicare Other | Admitting: Occupational Therapy

## 2012-03-17 ENCOUNTER — Inpatient Hospital Stay (HOSPITAL_COMMUNITY): Payer: Medicare Other | Admitting: *Deleted

## 2012-03-17 ENCOUNTER — Inpatient Hospital Stay (HOSPITAL_COMMUNITY): Payer: Medicare Other | Admitting: Physical Therapy

## 2012-03-17 LAB — GLUCOSE, CAPILLARY: Glucose-Capillary: 258 mg/dL — ABNORMAL HIGH (ref 70–99)

## 2012-03-17 NOTE — Progress Notes (Signed)
Occupational Therapy Session Note  Patient Details  Name: Judith [name] MRN: [id number] Date of Birth: 06/28/1940  Today's Date: 03/17/2012 Time: 0731-0829 Time Calculation (min): 58 min  Short Term Goals: Week 2:  OT Short Term Goal 1 (Week 2): Pt will complete bathing with Min A at bed or WC level  OT Short Term Goal 2 (Week 2): Pt with complete LB dressing with Min A at bed or WC level OT Short Term Goal 3 (Week 2): Pt will complete sldie board transfer with Min A and Min VC, including placement of board OT Short Term Goal 4 (Week 2): Pt will complete stand/squat pivot transfer to [facility] with Mod  A OT Short Term Goal 5 (Week 2): Pt will stand for peri area care with Mod A recall sequence for sit<>stand   Skilled Therapeutic Interventions/Progress Updates:    Pt seen for ADL retraining session with focus on sit<>stand, transfers, and standing balance to increase participation in LB dressing. Pt completed slide board transfer bed ->WC with Min A to place board and VC for sequencing. Pt completed LB bathing in standing with Mod A for sit<>stand and physical assist to wash bottom. Pt completed UB bathing and dressing seated at sink with setup. LB dressing with Mod A for sit<>stand to pull up pants. Donned sock and shoe with setup, and chair placed in front of her to support leg while tying shoe. Pt completed 3 sets of WC pushups with focus on increasing participation in slide board transfers to an uphill grade.    Therapy Documentation Precautions:  Precautions Precautions: Fall Precaution Comments: R BKA Restrictions Weight Bearing Restrictions: Yes RLE Weight Bearing: Non weight bearing Other Position/Activity Restrictions: NWB R LE    Pain: No pain reported this session    See FIM for current functional status  Therapy/Group: Individual Therapy  [name] 03/17/2012, 8:29 AM

## 2012-03-17 NOTE — Progress Notes (Signed)
Reviewed and agree with the attached treatment note.  Krishav Mamone, OTR/L 

## 2012-03-17 NOTE — Progress Notes (Signed)
Physical Therapy Session Note  Patient Details  Name: Judith [name] MRN: [id number] Date of Birth: 1940-11-18  Today's Date: 03/17/2012 Time: 0850-0930 Time Calculation (min): 40 min  Short Term Goals: Week 2:  PT Short Term Goal 1 (Week 2): = LTG min A overall w/c level   Skilled Therapeutic Interventions/Progress Updates:    Patient received sitting in wheelchair, eating breakfast. Patient reports she wants PT to return "after she has finished eating." Patient instructed in wheelchair mobility x200' with supervision and requires increased time to complete. Patient transferred squat pivot wheelchair<>NuStep seat with max assist secondary to NuStep seat being an elevated surface. Patient exercised on NuStep x14' on Lvl 2 for L LE and B UE to facilitate increased strength and endurance. Patient requires max assist for transfer back to wheelchair secondary to uncontrolled descent into wheelchair.   Patient returned to room and left seated in wheelchair with all needs within reach.  Therapy Documentation Precautions:  Precautions Precautions: Fall Precaution Comments: R BKA Restrictions Weight Bearing Restrictions: Yes RLE Weight Bearing: Non weight bearing Other Position/Activity Restrictions: NWB R LE General: Amount of Missed PT Time (min): 20 Minutes Missed Time Reason: Other (comment) (Pt eating breakfast, refuses to start before finishing eating) Pain: Pain Assessment Pain Assessment: No/denies pain Pain Score: 0-No pain Locomotion : Ambulation Ambulation/Gait Assistance: Not tested (comment) Wheelchair Mobility Wheelchair Mobility: Yes Wheelchair Assistance: 5: Supervision;Other (comment) (requires increased time to complete) Wheelchair Assistance Details: Verbal cues for precautions/safety;Verbal cues for technique Wheelchair Propulsion: Both upper extremities Wheelchair Parts Management: Needs assistance Distance: 200   See FIM for current functional  status  Therapy/Group: Individual Therapy  Chipper Herb. Nicodemus Denk, PT, DPT  03/17/2012, 10:39 AM

## 2012-03-17 NOTE — Progress Notes (Signed)
Occupational Therapy Session Note  Patient Details  Name: Judith [name] MRN: [id number] Date of Birth: 02-08-1940  Today's Date: 03/17/2012 Time: 1330-1400 Time Calculation (min): 30 min  Short Term Goals: Week 2:  OT Short Term Goal 1 (Week 2): Pt will complete bathing with Min A at bed or WC level  OT Short Term Goal 2 (Week 2): Pt with complete LB dressing with Min A at bed or WC level OT Short Term Goal 3 (Week 2): Pt will complete sldie board transfer with Min A and Min VC, including placement of board OT Short Term Goal 4 (Week 2): Pt will complete stand/squat pivot transfer to [facility] with Mod  A OT Short Term Goal 5 (Week 2): Pt will stand for peri area care with Mod A recall sequence for sit<>stand   Skilled Therapeutic Interventions/Progress Updates:    Pt seen for 1:1 OT session with focus on sit<>stand, standing balance, and slide board transfers. Pt completed slide board transfers WC<>mat table with VC for board placement and close supervision. Pt completed sit<>stands X 4 while reaching behind her with one hand at a time in preparation for pulling up pants/washing bottom. Pt propelled WC to and from gym with focus on UB strengthening and WC mobility.   Therapy Documentation Precautions:  Precautions Precautions: Fall Precaution Comments: R BKA Restrictions Weight Bearing Restrictions: Yes RLE Weight Bearing: Non weight bearing Other Position/Activity Restrictions: NWB R LE    Pain: Pain Assessment Pain Assessment: No/denies pain   Therapy/Group: Individual Therapy  [name] 03/17/2012, 3:21 PM

## 2012-03-17 NOTE — Progress Notes (Signed)
Physical Therapy Session Note  Patient Details  Name: Judith [name] MRN: [id number] Date of Birth: March 18, 1940  Today's Date: 03/17/2012 Time: 1100-1147 Time Calculation (min): 47 min  Short Term Goals: Week 1:  PT Short Term Goal 1 (Week 1): Patient will be able to perform bed mobility with S/min-A (has hospital bed at home) PT Short Term Goal 1 - Progress (Week 1): Partly met PT Short Term Goal 2 (Week 1): Patient will be able to perform transfers with Mod-Assist  PT Short Term Goal 2 - Progress (Week 1): Met PT Short Term Goal 3 (Week 1): Patient will be able to perform wheelchair mobility and management x 150' with S/Independent assist. PT Short Term Goal 3 - Progress (Week 1): Partly met Week 2:  PT Short Term Goal 1 (Week 2): = LTG min A overall w/c level   Skilled Therapeutic Interventions/Progress Updates:   Patient performed w/c mobility in controlled environment x 200' with supervision but extra time for UE strength and endurance training.  Patient performed squat pivots bed <> w/c with max A for lifting and lowering assistance and to complete full pivot with verbal and tactile cues for trunk elongation and anterior pelvic tilt during anterior lean to bring COG over LLE during hip and knee extension.  Performed bilat LE prolonged hip flexor and quad stretching reclined on mat with LE hanging off edge of mat to increase trunk extension and hip extension ROM for standing; added in isometric gluteal sets x 12 reps and hip IR/ADD pillow squeeze x 10 reps.  Continued UE strengthening with 2 sets x 10 reps shoulder depression and elbow extension push ups with blocks with min-mod A and verbal cues for trunk elongation and anterior pelvic tilt during anterior lean to bring COG over LLE.  Performed sit > stand x 2 reps from elevated mat with UE support on RW and min-mod A to come to full stand with verbal and tactile cues for full trunk, L hip and knee extension.  Able to maintain static standing ~1  minute each repetition.    Therapy Documentation Precautions:  Precautions Precautions: Fall Precaution Comments: R BKA Restrictions Weight Bearing Restrictions: Yes RLE Weight Bearing: Non weight bearing Other Position/Activity Restrictions: NWB R LE Pain: Pain Assessment Pain Assessment: No/denies pain Pain Score: 0-No pain  See FIM for current functional status  Therapy/Group: Individual Therapy  Edman Circle Faucette 03/17/2012, 12:00 PM

## 2012-03-17 NOTE — Progress Notes (Signed)
Patient ID: Judith [name], female   DOB: [birth year], 72 y.o.   MRN: [id number] Subjective/Complaints: 72 y.o. right-handed female with history of insulin-dependent diabetes mellitus as well as right below-knee amputation 2010 and uses a prosthesis provided by [employer] as well as rolling walker. Patient is a resident of [facility]. Admitted 02/29/2012 after a recent fall at home without loss of consciousness. She was taken to the emergency room with findings of mildly displaced fracture of the distal femur placed in a splint maintain nonweightbearing status and discharged home. She presented to the orthopedic office for followup x-rays repeated showing a displaced right distal femoral fracture. Underwent ORIF 03/01/2012 per Dr. Carola Frost. She remains nonweightbearing x6 weeks  Feels good today no complaints Occ bowel and bladder incont  Review of Systems  Constitutional: Negative for fever and chills.  Respiratory: Negative for cough and shortness of breath.   Gastrointestinal: Negative for diarrhea and constipation.  Genitourinary: Negative for dysuria.  All other systems reviewed and are negative.    Objective: Vital Signs: Blood pressure 131/73, pulse 77, temperature 98.4 F (36.9 C), temperature source Oral, resp. rate 17, height 5\' 4"  (1.626 m), weight 90.357 kg (199 lb 3.2 oz), SpO2 96.00%. No results found. Results for orders placed during the hospital encounter of 03/04/12 (from the past 72 hour(s))  GLUCOSE, CAPILLARY     Status: Abnormal   Collection Time    03/14/12 11:52 AM      Result Value Range   Glucose-Capillary 267 (*) 70 - 99 mg/dL   Comment 1 Notify RN    GLUCOSE, CAPILLARY     Status: Abnormal   Collection Time    03/14/12  4:44 PM      Result Value Range   Glucose-Capillary 286 (*) 70 - 99 mg/dL   Comment 1 Notify RN    GLUCOSE, CAPILLARY     Status: Abnormal   Collection Time    03/14/12  8:54 PM      Result Value Range    Glucose-Capillary 101 (*) 70 - 99 mg/dL   Comment 1 Notify RN    GLUCOSE, CAPILLARY     Status: Abnormal   Collection Time    03/15/12  7:12 AM      Result Value Range   Glucose-Capillary 113 (*) 70 - 99 mg/dL   Comment 1 Notify RN    GLUCOSE, CAPILLARY     Status: Abnormal   Collection Time    03/15/12 12:02 PM      Result Value Range   Glucose-Capillary 159 (*) 70 - 99 mg/dL   Comment 1 Notify RN    GLUCOSE, CAPILLARY     Status: Abnormal   Collection Time    03/15/12  4:47 PM      Result Value Range   Glucose-Capillary 215 (*) 70 - 99 mg/dL   Comment 1 Notify RN    GLUCOSE, CAPILLARY     Status: Abnormal   Collection Time    03/15/12  8:43 PM      Result Value Range   Glucose-Capillary 121 (*) 70 - 99 mg/dL   Comment 1 Notify RN    GLUCOSE, CAPILLARY     Status: Abnormal   Collection Time    03/16/12  7:07 AM      Result Value Range   Glucose-Capillary 116 (*) 70 - 99 mg/dL   Comment 1 Notify RN    GLUCOSE, CAPILLARY     Status: Abnormal   Collection  Time    03/16/12 11:35 AM      Result Value Range   Glucose-Capillary 240 (*) 70 - 99 mg/dL   Comment 1 Notify RN    GLUCOSE, CAPILLARY     Status: Abnormal   Collection Time    03/16/12  4:43 PM      Result Value Range   Glucose-Capillary 132 (*) 70 - 99 mg/dL  GLUCOSE, CAPILLARY     Status: None   Collection Time    03/16/12  8:59 PM      Result Value Range   Glucose-Capillary 76  70 - 99 mg/dL   Comment 1 Notify RN    GLUCOSE, CAPILLARY     Status: Abnormal   Collection Time    03/16/12 11:46 PM      Result Value Range   Glucose-Capillary 101 (*) 70 - 99 mg/dL  GLUCOSE, CAPILLARY     Status: Abnormal   Collection Time    03/17/12  7:13 AM      Result Value Range   Glucose-Capillary 136 (*) 70 - 99 mg/dL   Comment 1 Notify RN       HEENT: normal Cardio: RRR Resp: CTA B/L GI: BS positive Extremity:  No Edema Skin:   Wound C/D/I and Other Left heel normal Neuro: Alert/Oriented, Abnormal Sensory  reduced sensation in finger tips and below ankle LLE, Normal Motor and Other L foot and bilateral hand intrinsic atrophy Musc/Skel:  Other R BKA stump well healed mild edema Gen: NAD   Assessment/Plan: 1. Functional deficits secondary to R supracondylar femur fracture in a pt with R BKA, NWB x 6 weeks which require 3+ hours per day of interdisciplinary therapy in a comprehensive inpatient rehab setting. Physiatrist is providing close team supervision and 24 hour management of active medical problems listed below. Physiatrist and rehab team continue to assess barriers to discharge/monitor patient progress toward functional and medical goals. FIM: FIM - Bathing Bathing Steps Patient Completed: Chest;Right Arm;Left Arm;Abdomen;Front perineal area;Buttocks;Right upper leg;Left upper leg;Left lower leg (including foot) Bathing: 5: Set-up assist to: Obtain items  FIM - Upper Body Dressing/Undressing Upper body dressing/undressing steps patient completed: Thread/unthread right bra strap;Thread/unthread left bra strap;Hook/unhook bra;Thread/unthread right sleeve of pullover shirt/dresss;Thread/unthread left sleeve of pullover shirt/dress;Put head through opening of pull over shirt/dress;Pull shirt over trunk Upper body dressing/undressing: 5: Supervision: Safety issues/verbal cues FIM - Lower Body Dressing/Undressing Lower body dressing/undressing steps patient completed: Thread/unthread right pants leg;Thread/unthread left pants leg;Don/Doff left sock;Don/Doff left shoe;Fasten/unfasten left shoe Lower body dressing/undressing: 2: Max-Patient completed 25-49% of tasks  FIM - Toileting Toileting steps completed by patient: Performs perineal hygiene Toileting: 1: Two helpers  FIM - Diplomatic Services operational officer Devices: Sliding board;Grab bars Toilet Transfers: 1-Two helpers  FIM - Architectural technologist Transfer: 1: Two  helpers  FIM - Locomotion: Wheelchair Distance: 150 Locomotion: Wheelchair: 5: Travels 150 ft or more: maneuvers on rugs and over door sills with supervision, cueing or coaxing FIM - Locomotion: Ambulation Ambulation/Gait Assistance: Not tested (comment) Locomotion: Ambulation: 0: Activity did not occur  Comprehension Comprehension Mode: Auditory Comprehension: 5-Follows basic conversation/direction: With no assist  Expression Expression Mode: Verbal Expression: 5-Expresses basic needs/ideas: With no assist  Social Interaction Social Interaction: 6-Interacts appropriately with others with medication or extra time (anti-anxiety, antidepressant).  Problem Solving Problem Solving: 5-Solves basic problems: With no assist  Memory Memory: 4-Recognizes or recalls 75 - 89% of the time/requires cueing 10 - 24% of the  time   Medical Problem List and Plan:  1. Right supracondylar femur fracture status post ORIF 03/01/2012 -2. DVT Prophylaxis/Anticoagulation: Subcutaneous Lovenox. Monitor platelet counts any signs of bleeding  3. Pain Management: Hydrocodone and Robaxin as needed. Monitor with increased mobility.  4. Neuropsych: This patient is capable of making decisions on his/her own behalf.  5. Postoperative anemia. Latest hemoglobin 8.6. Followup CBC. Patient is asymptomatic  6. History right below-knee amputation 2010. Patient does have a prosthesis per [employer]  7. Diabetes mellitus with peripheral neuropathy. uncontrolledHemoglobin A1c 8.0.Increase Levemir 16 Unit BID Units for high am CBG ,. Titrate regimen as needed. restart glucotrol.  Am CBG improved, only one CBG over 200 yesterday and no hypoglycemia Change SSI to moderate 8. Hypertension. Clonidine 0.05 mg daily. Monitor with increased activity   9. Hyperlipidemia. Zocor  LOS (Days) 13 A FACE TO FACE EVALUATION WAS PERFORMED  Chrys Landgrebe E 03/17/2012, 8:21 AM

## 2012-03-17 NOTE — Progress Notes (Signed)
Reviewed and agree with the attached treatment note.  Gola Bribiesca, OTR/L 

## 2012-03-18 ENCOUNTER — Inpatient Hospital Stay (HOSPITAL_COMMUNITY): Payer: Medicare Other | Admitting: Physical Therapy

## 2012-03-18 ENCOUNTER — Inpatient Hospital Stay (HOSPITAL_COMMUNITY): Payer: Medicare Other | Admitting: Occupational Therapy

## 2012-03-18 ENCOUNTER — Inpatient Hospital Stay (HOSPITAL_COMMUNITY): Payer: Medicare Other | Admitting: *Deleted

## 2012-03-18 DIAGNOSIS — S72453A Displaced supracondylar fracture without intracondylar extension of lower end of unspecified femur, initial encounter for closed fracture: Secondary | ICD-10-CM

## 2012-03-18 DIAGNOSIS — W19XXXA Unspecified fall, initial encounter: Secondary | ICD-10-CM

## 2012-03-18 LAB — GLUCOSE, CAPILLARY: Glucose-Capillary: 162 mg/dL — ABNORMAL HIGH (ref 70–99)

## 2012-03-18 LAB — CREATININE, SERUM
Creatinine, Ser: 0.78 mg/dL (ref 0.50–1.10)
GFR calc non Af Amer: 82 mL/min — ABNORMAL LOW (ref >90)

## 2012-03-18 MED ORDER — BISACODYL 10 MG RE SUPP
10.0000 mg | Freq: Every day | RECTAL | Status: DC | PRN
  Administered 2012-03-18: 10 mg via RECTAL
  Filled 2012-03-18: qty 1

## 2012-03-18 MED ORDER — GLIPIZIDE 5 MG PO TABS
5.0000 mg | ORAL_TABLET | Freq: Every day | ORAL | Status: DC
  Administered 2012-03-19 – 2012-03-23 (×5): 5 mg via ORAL
  Filled 2012-03-18 (×6): qty 1

## 2012-03-18 MED ORDER — SENNOSIDES-DOCUSATE SODIUM 8.6-50 MG PO TABS
2.0000 | ORAL_TABLET | Freq: Every day | ORAL | Status: DC
  Administered 2012-03-18 – 2012-03-22 (×4): 2 via ORAL
  Filled 2012-03-18 (×4): qty 2

## 2012-03-18 NOTE — Progress Notes (Signed)
Patient ID: Judith [name], female   DOB: 1940-11-07, 72 y.o.   MRN: [id number] Subjective/Complaints: 72 y.o. right-handed female with history of insulin-dependent diabetes mellitus as well as right below-knee amputation 2010 and uses a prosthesis provided by [employer] as well as rolling walker. Patient is a resident of [facility]. Admitted 02/29/2012 after a recent fall at home without loss of consciousness. She was taken to the emergency room with findings of mildly displaced fracture of the distal femur placed in a splint maintain nonweightbearing status and discharged home. She presented to the orthopedic office for followup x-rays repeated showing a displaced right distal femoral fracture. Underwent ORIF 03/01/2012 per Dr. Carola Frost. She remains nonweightbearing x6 weeks  Feels good today no complaints No bladder incont yesterday  Review of Systems  Constitutional: Negative for fever and chills.  Respiratory: Negative for cough and shortness of breath.   Gastrointestinal: Negative for diarrhea and constipation.  Genitourinary: Negative for dysuria.  All other systems reviewed and are negative.    Objective: Vital Signs: Blood pressure 133/74, pulse 78, temperature 98.7 F (37.1 C), temperature source Oral, resp. rate 17, height 5\' 4"  (1.626 m), weight 90.357 kg (199 lb 3.2 oz), SpO2 97.00%. No results found. Results for orders placed during the hospital encounter of 03/04/12 (from the past 72 hour(s))  GLUCOSE, CAPILLARY     Status: Abnormal   Collection Time    03/15/12 12:02 PM      Result Value Range   Glucose-Capillary 159 (*) 70 - 99 mg/dL   Comment 1 Notify RN    GLUCOSE, CAPILLARY     Status: Abnormal   Collection Time    03/15/12  4:47 PM      Result Value Range   Glucose-Capillary 215 (*) 70 - 99 mg/dL   Comment 1 Notify RN    GLUCOSE, CAPILLARY     Status: Abnormal   Collection Time    03/15/12  8:43 PM      Result Value Range   Glucose-Capillary 121 (*) 70 - 99 mg/dL   Comment 1 Notify RN    GLUCOSE, CAPILLARY     Status: Abnormal   Collection Time    03/16/12  7:07 AM      Result Value Range   Glucose-Capillary 116 (*) 70 - 99 mg/dL   Comment 1 Notify RN    GLUCOSE, CAPILLARY     Status: Abnormal   Collection Time    03/16/12 11:35 AM      Result Value Range   Glucose-Capillary 240 (*) 70 - 99 mg/dL   Comment 1 Notify RN    GLUCOSE, CAPILLARY     Status: Abnormal   Collection Time    03/16/12  4:43 PM      Result Value Range   Glucose-Capillary 132 (*) 70 - 99 mg/dL  GLUCOSE, CAPILLARY     Status: None   Collection Time    03/16/12  8:59 PM      Result Value Range   Glucose-Capillary 76  70 - 99 mg/dL   Comment 1 Notify RN    GLUCOSE, CAPILLARY     Status: Abnormal   Collection Time    03/16/12 11:46 PM      Result Value Range   Glucose-Capillary 101 (*) 70 - 99 mg/dL  GLUCOSE, CAPILLARY     Status: Abnormal   Collection Time    03/17/12  7:13 AM      Result Value Range   Glucose-Capillary  136 (*) 70 - 99 mg/dL   Comment 1 Notify RN    GLUCOSE, CAPILLARY     Status: Abnormal   Collection Time    03/17/12 11:51 AM      Result Value Range   Glucose-Capillary 245 (*) 70 - 99 mg/dL   Comment 1 Notify RN    GLUCOSE, CAPILLARY     Status: Abnormal   Collection Time    03/17/12  4:46 PM      Result Value Range   Glucose-Capillary 247 (*) 70 - 99 mg/dL   Comment 1 Notify RN    GLUCOSE, CAPILLARY     Status: Abnormal   Collection Time    03/17/12  8:28 PM      Result Value Range   Glucose-Capillary 258 (*) 70 - 99 mg/dL   Comment 1 Notify RN    CREATININE, SERUM     Status: Abnormal   Collection Time    03/18/12  5:40 AM      Result Value Range   Creatinine, Ser 0.78  0.50 - 1.10 mg/dL   GFR calc non Af Amer 82 (*) >90 mL/min   GFR calc Af Amer >90  >90 mL/min   Comment:            The eGFR has been calculated     using the CKD EPI equation.     This calculation has not been      validated in all clinical     situations.     eGFR's persistently     <90 mL/min signify     possible Chronic Kidney Disease.  GLUCOSE, CAPILLARY     Status: Abnormal   Collection Time    03/18/12  7:09 AM      Result Value Range   Glucose-Capillary 118 (*) 70 - 99 mg/dL   Comment 1 Notify RN       HEENT: normal Cardio: RRR Resp: CTA B/L GI: BS positive Extremity:  No Edema Skin:   Wound C/D/I and Other Left heel normal Neuro: Alert/Oriented, Abnormal Sensory reduced sensation in finger tips and below ankle LLE, Normal Motor and Other L foot and bilateral hand intrinsic atrophy Musc/Skel:  Other R BKA stump well healed mild edema Gen: NAD   Assessment/Plan: 1. Functional deficits secondary to R supracondylar femur fracture in a pt with R BKA, NWB x 6 weeks which require 3+ hours per day of interdisciplinary therapy in a comprehensive inpatient rehab setting. Physiatrist is providing close team supervision and 24 hour management of active medical problems listed below. Physiatrist and rehab team continue to assess barriers to discharge/monitor patient progress toward functional and medical goals. FIM: FIM - Bathing Bathing Steps Patient Completed: Chest;Right Arm;Left Arm;Abdomen;Front perineal area;Right upper leg;Left upper leg;Left lower leg (including foot) (Sit<>stand level) Bathing: 3: Mod-Patient completes 5-7 1f 10 parts or 50-74%  FIM - Upper Body Dressing/Undressing Upper body dressing/undressing steps patient completed: Thread/unthread right bra strap;Thread/unthread left bra strap;Hook/unhook bra;Thread/unthread right sleeve of pullover shirt/dresss;Thread/unthread left sleeve of pullover shirt/dress;Put head through opening of pull over shirt/dress;Pull shirt over trunk Upper body dressing/undressing: 5: Set-up assist to: Obtain clothing/put away FIM - Lower Body Dressing/Undressing Lower body dressing/undressing steps patient completed: Thread/unthread right pants  leg;Thread/unthread left pants leg;Don/Doff left sock;Don/Doff left shoe;Fasten/unfasten left shoe Lower body dressing/undressing: 2: Max-Patient completed 25-49% of tasks (sit <>stand level)  FIM - Toileting Toileting steps completed by patient: Performs perineal hygiene Toileting: 1: Two helpers  FIM - Archivist  Transfers Assistive Devices: Sliding board;Grab bars Toilet Transfers: 1-Two helpers  FIM - Banker Devices: Arm rests Bed/Chair Transfer: 5: Supine > Sit: Supervision (verbal cues/safety issues);5: Sit > Supine: Supervision (verbal cues/safety issues);2: Bed > Chair or W/C: Max A (lift and lower assist);2: Chair or W/C > Bed: Max A (lift and lower assist)  FIM - Locomotion: Wheelchair Distance: 150 Locomotion: Wheelchair: 5: Travels 150 ft or more: maneuvers on rugs and over door sills with supervision, cueing or coaxing FIM - Locomotion: Ambulation Ambulation/Gait Assistance: Not tested (comment) Locomotion: Ambulation: 0: Activity did not occur  Comprehension Comprehension Mode: Auditory Comprehension: 5-Follows basic conversation/direction: With no assist  Expression Expression Mode: Verbal Expression: 5-Expresses basic needs/ideas: With no assist  Social Interaction Social Interaction: 6-Interacts appropriately with others with medication or extra time (anti-anxiety, antidepressant).  Problem Solving Problem Solving: 5-Solves basic problems: With no assist  Memory Memory: 4-Recognizes or recalls 75 - 89% of the time/requires cueing 10 - 24% of the time   Medical Problem List and Plan:  1. Right supracondylar femur fracture status post ORIF 03/01/2012 -2. DVT Prophylaxis/Anticoagulation: Subcutaneous Lovenox. Monitor platelet counts any signs of bleeding  3. Pain Management: Hydrocodone and Robaxin as needed. Monitor with increased mobility.  4. Neuropsych: This patient is capable of making decisions on  his/her own behalf.  5. Postoperative anemia. Latest hemoglobin 8.6. Followup CBC. Patient is asymptomatic  6. History right below-knee amputation 2010. Patient does have a prosthesis per [facility] [employer]  7. Diabetes mellitus with peripheral neuropathy. uncontrolledHemoglobin A1c 8.0.Increase Levemir 16 Unit BID Units for high am CBG ,. Titrate regimen as needed. restart glucotrol.  Am CBG improved, several CBG over 200 yesterday and no hypoglycemia Change SSI to sensitive 8. Hypertension. Clonidine 0.05 mg daily. Monitor with increased activity   9. Hyperlipidemia. Zocor  LOS (Days) 14 A FACE TO FACE EVALUATION WAS PERFORMED  Jajuan Skoog E 03/18/2012, 8:10 AM

## 2012-03-18 NOTE — Progress Notes (Signed)
Occupational Therapy Weekly Progress Note  Patient Details  Name: Judith [name] MRN: [id number] Date of Birth: 01/10/1941  Today's Date: 03/18/2012 Time: 4098-1191 Time Calculation (min): 44 min  Patient has met 4 of 5 short term goals.  Pt is making good progress towards long term goals. Pt is Min A-Mod for slide board transfers, including placement of board (Min A to level surfaces, Mod A if transferring towards an uphill angle) Pt is Mod A for sit<>stand, and can maintain standing balance with Min A. Pt can complete all steps of LB dressing, required Mod A for standing to pull up pants. Pt is setup for LB bathing and donning pull-up brief at bed level, as she did PTA. Pt is supervision for UB dressing and bathing.   Patient continues to demonstrate the following deficits: bilat UE and LE weakness, impaired core strength and trunk control, impaired dynamic balance, endurance and standing tolerance and therefore will continue to benefit from skilled OT intervention to enhance overall performance with BADL and Reduce care partner burden.  Patient progressing toward long term goals..  Continue plan of care.  OT Short Term Goals No short term goals set  Skilled Therapeutic Interventions/Progress Updates:    Pt seen for ADL retraining session with focus on sit<>stand, slide board transfers, and standing balance to increase participation in LB dressing. Pt completed peri area care and donned pullup brief at bed level with setup. Pt completed slide board transfer bed ->WC with VC for sequencing and safe board placement. Pt donned pants sit<>stand at sink with Mod A for sit<>stand and Min A to maintain standing balance while pt pulled up pants. Pt completed UB bathing and dressing seated at sink with setup. Donned sock and shoe with setup, and chair placed in front of her to support leg while tying shoe. Pt's daughter in law present and observed session, with several positive comments about pt's  progress.  Therapy Documentation Precautions:  Precautions Precautions: Fall Precaution Comments: R BKA Restrictions Weight Bearing Restrictions: Yes RLE Weight Bearing: Non weight bearing Other Position/Activity Restrictions: NWB R LE   Pain: Pain Assessment Pain Assessment: No/denies pain Pain Score: 0-No pain  See FIM for current functional status  Therapy/Group: Individual Therapy  [name] 03/18/2012, 9:17 AM

## 2012-03-18 NOTE — Progress Notes (Signed)
Occupational Therapy Note  Patient Details  Name: Judith [name] MRN: [id number] Date of Birth: December 13, 1940 Today's Date: 03/18/2012  Pt missed 30 mins skilled OT treatment session secondary to refusal.  Pt reports feeling fatigued and feeling "yuck".  Encouraged pt to participate even at bed level, however pt insisted she did not feel well and wanted to rest.  RN notified.  Leonette Monarch 03/18/2012, 1:41 PM

## 2012-03-18 NOTE — Progress Notes (Signed)
Social Work Patient ID: Judith [name], female   DOB: 1940/07/21, 72 y.o.   MRN: [id number] Met with daughter in-law while here who is very pleased with pt's progress, she reports she is doing more here than at home PTA.  She  Plans to continue this once home.  Granddaughter to come in Monday and she will be here in the afternoon to do car transfer.  Prepare for Discharge Wednesday.  Had questions for RN regarding diabetes, Berline Lopes will address.

## 2012-03-18 NOTE — Progress Notes (Signed)
Physical Therapy Session Note  Patient Details  Name: Judith [name] MRN: [id number] Date of Birth: [birth year]  Today's Date: 03/18/2012 Time: 4098-1191  Time Calculation (min): 61 min  Short Term Goals: Week 2:  PT Short Term Goal 1 (Week 2): = LTG min A overall w/c level   Skilled Therapeutic Interventions/Progress Updates:    Patient received sitting in wheelchair. Today's session focused on wheelchair mobility, sit<>stands with rolling walker (from elevated surface), and static standing. Patient performs squat pivot transfer wheelchair>elevated mat with max assist.  Patient performed 5 sit<>stand transfers with rolling walker from elevated mat with min-mod assist (mod assist required when patient experiences posterior LOB when transitioning L hand from mat to rolling walker). Patient performed static standing with rolling walker and min guard 75"x1, 55" x1, 30" x1, 45" x1, and 50" x1. Patient requires tactile and verbal cues for upright posture.   Patient performed 2 sets x10 reps of tricep push ups with blocks. Patient able to achieve enough elevation to clear surface of mat. Patient performed squat pivot transfer elevated mat>wheelchair with mod assist.  Patient returned to room and left seated in wheelchair with all needs within reach.  Therapy Documentation Precautions:  Precautions Precautions: Fall Precaution Comments: R BKA Restrictions Weight Bearing Restrictions: Yes RLE Weight Bearing: Non weight bearing Other Position/Activity Restrictions: NWB R LE Pain: Pain Assessment Pain Assessment: No/denies pain Pain Score: 0-No pain Locomotion : Wheelchair Mobility Wheelchair Mobility: Yes Wheelchair Assistance: 5: Supervision;Other (comment) (increased time to complete) Wheelchair Assistance Details: Verbal cues for precautions/safety Wheelchair Propulsion: Both upper extremities Wheelchair Parts Management: Needs assistance Distance: 200   See FIM for current  functional status  Therapy/Group: Individual Therapy  Chipper Herb. Teriana Danker, PT, DPT  03/18/2012, 12:06 PM

## 2012-03-18 NOTE — Progress Notes (Signed)
Physical Therapy Weekly Progress Note  Patient Details  Name: Judith [name] MRN: [id number] Date of Birth: 20-Feb-1940  Today's Date: 03/18/2012 Time: 1110-1204 Time Calculation (min): 54 min  Patient has met 4 of 7 long term goals.  Short term goals for week 3 not set due to estimated length of stay.  Patient is currently min A for bed mobility with hospital bed controls and bed rails, bed <> w/c transfers when level with slideboard, supervision for w/c mobility with manual chair with extra time in controlled environment, and mod-max A to stand and maintain static standing with bilat UE support from elevated surface.  Patient is demonstrating improved UE and LLE strength overall but patient continues to demonstrate the following deficits: bilat UE and LE weakness, impaired core strength and trunk control, impaired dynamic balance, endurance and standing tolerance and therefore will continue to benefit from skilled PT intervention to enhance overall performance with activity tolerance, balance, postural control and functional transfers.  Family to be present early next week for formal mobility, basic and car transfer training.    See Patient's Care Plan for progression toward long term goals.  Patient progressing toward long term goals..  Continue plan of care.  Skilled Therapeutic Interventions/Progress Updates:   Patient reporting feeling very weak and tired this am but willing to participate.  Patient performed w/c mobility x 150' with bilat UE propulsion for UE strengthening and endurance training with supervision; transfer w/c <> Nustep squat pivot with total A for uphill to Nustep and max A downhill back to w/c with verbal cues for hand placement and over the back technique to facilitate full anterior lean and cues for full hip and knee extension during pivoting.  Performed Nustep x 10 minutes at level 6 resistance with bilat UE and LLE for UE and LE strengthening and endurance training.  Following  Nustep performed 1 reps sit > stand with UE support on RW with max A to stand fully; patient unable to fully extend hip and trunk and unable to maintain standing >30 seconds secondary to fatigue.  Patient reporting significant fatigue and requesting to return to room.  Had nurse tech assess CBG prior to returning and vitals assessed: all WNL.  Returned to room with total A in w/c and performed total A squat pivot w/c > bed up hill with over the back technique and total A for lateral scoots in bed to reposition closer to [facility]; patient performed sit > supine in bed with min A and bed rails.  Patient set up with lunch in bed but already falling asleep.  Therapy Documentation Precautions:  Precautions Precautions: Fall Precaution Comments: R BKA Restrictions Weight Bearing Restrictions: Yes RLE Weight Bearing: Non weight bearing Other Position/Activity Restrictions: NWB R LE Vital Signs: Therapy Vitals Pulse Rate: 83 BP: 114/70 mmHg Patient Position, if appropriate: Sitting Oxygen Therapy SpO2: 100 % O2 Device: None (Room air) Pain: Pain Assessment Pain Assessment: No/denies pain Pain Score: 0-No pain Locomotion : Ambulation Ambulation/Gait Assistance: Not tested (comment) Wheelchair Mobility Wheelchair Mobility: Yes Wheelchair Assistance: 5: Supervision;Other (comment) (increased time to complete) Wheelchair Assistance Details: Verbal cues for precautions/safety Wheelchair Propulsion: Both upper extremities Wheelchair Parts Management: Needs assistance Distance: 150   See FIM for current functional status  Therapy/Group: Individual Therapy  Edman Circle Faucette 03/18/2012, 12:12 PM

## 2012-03-19 ENCOUNTER — Inpatient Hospital Stay (HOSPITAL_COMMUNITY): Payer: Medicare Other | Admitting: *Deleted

## 2012-03-19 DIAGNOSIS — S72453A Displaced supracondylar fracture without intracondylar extension of lower end of unspecified femur, initial encounter for closed fracture: Secondary | ICD-10-CM

## 2012-03-19 DIAGNOSIS — W19XXXA Unspecified fall, initial encounter: Secondary | ICD-10-CM

## 2012-03-19 LAB — GLUCOSE, CAPILLARY
Glucose-Capillary: 73 mg/dL (ref 70–99)
Glucose-Capillary: 84 mg/dL (ref 70–99)

## 2012-03-19 NOTE — Progress Notes (Signed)
Patient ID: Judith [name], female   DOB: [birth year], 72 y.o.   MRN: [id number] Subjective/Complaints: 72 y.o. right-handed female with history of insulin-dependent diabetes mellitus as well as right below-knee amputation 2010 and uses a prosthesis provided by [employer] as well as rolling walker. Patient is a resident of [facility]. Admitted 02/29/2012 after a recent fall at home without loss of consciousness. She was taken to the emergency room with findings of mildly displaced fracture of the distal femur placed in a splint maintain nonweightbearing status and discharged home. She presented to the orthopedic office for followup x-rays repeated showing a displaced right distal femoral fracture. Underwent ORIF 03/01/2012 per Dr. Carola Frost. She remains nonweightbearing x6 weeks  Feels good again this AM. No complaints. In good spirits.   Review of Systems  Constitutional: Negative for fever and chills.  Respiratory: Negative for cough and shortness of breath.   Gastrointestinal: Negative for diarrhea and constipation.  Genitourinary: Negative for dysuria.  All other systems reviewed and are negative.    Objective: Vital Signs: Blood pressure 125/62, pulse 77, temperature 98.6 F (37 C), temperature source Oral, resp. rate 17, height 5\' 4"  (1.626 m), weight 90.357 kg (199 lb 3.2 oz), SpO2 100.00%. No results found. Results for orders placed during the hospital encounter of 03/04/12 (from the past 72 hour(s))  GLUCOSE, CAPILLARY     Status: Abnormal   Collection Time    03/16/12 11:35 AM      Result Value Range   Glucose-Capillary 240 (*) 70 - 99 mg/dL   Comment 1 Notify RN    GLUCOSE, CAPILLARY     Status: Abnormal   Collection Time    03/16/12  4:43 PM      Result Value Range   Glucose-Capillary 132 (*) 70 - 99 mg/dL  GLUCOSE, CAPILLARY     Status: None   Collection Time    03/16/12  8:59 PM      Result Value Range   Glucose-Capillary 76  70 - 99 mg/dL    Comment 1 Notify RN    GLUCOSE, CAPILLARY     Status: Abnormal   Collection Time    03/16/12 11:46 PM      Result Value Range   Glucose-Capillary 101 (*) 70 - 99 mg/dL  GLUCOSE, CAPILLARY     Status: Abnormal   Collection Time    03/17/12  7:13 AM      Result Value Range   Glucose-Capillary 136 (*) 70 - 99 mg/dL   Comment 1 Notify RN    GLUCOSE, CAPILLARY     Status: Abnormal   Collection Time    03/17/12 11:51 AM      Result Value Range   Glucose-Capillary 245 (*) 70 - 99 mg/dL   Comment 1 Notify RN    GLUCOSE, CAPILLARY     Status: Abnormal   Collection Time    03/17/12  4:46 PM      Result Value Range   Glucose-Capillary 247 (*) 70 - 99 mg/dL   Comment 1 Notify RN    GLUCOSE, CAPILLARY     Status: Abnormal   Collection Time    03/17/12  8:28 PM      Result Value Range   Glucose-Capillary 258 (*) 70 - 99 mg/dL   Comment 1 Notify RN    CREATININE, SERUM     Status: Abnormal   Collection Time    03/18/12  5:40 AM      Result Value Range  Creatinine, Ser 0.78  0.50 - 1.10 mg/dL   GFR calc non Af Amer 82 (*) >90 mL/min   GFR calc Af Amer >90  >90 mL/min   Comment:            The eGFR has been calculated     using the CKD EPI equation.     This calculation has not been     validated in all clinical     situations.     eGFR's persistently     <90 mL/min signify     possible Chronic Kidney Disease.  GLUCOSE, CAPILLARY     Status: Abnormal   Collection Time    03/18/12  7:09 AM      Result Value Range   Glucose-Capillary 118 (*) 70 - 99 mg/dL   Comment 1 Notify RN    GLUCOSE, CAPILLARY     Status: Abnormal   Collection Time    03/18/12 11:53 AM      Result Value Range   Glucose-Capillary 166 (*) 70 - 99 mg/dL   Comment 1 Notify RN    GLUCOSE, CAPILLARY     Status: Abnormal   Collection Time    03/18/12  4:20 PM      Result Value Range   Glucose-Capillary 101 (*) 70 - 99 mg/dL   Comment 1 Notify RN    GLUCOSE, CAPILLARY     Status: Abnormal   Collection  Time    03/18/12  8:56 PM      Result Value Range   Glucose-Capillary 162 (*) 70 - 99 mg/dL  GLUCOSE, CAPILLARY     Status: None   Collection Time    03/19/12  7:39 AM      Result Value Range   Glucose-Capillary 73  70 - 99 mg/dL     HEENT: normal Cardio: RRR Resp: CTA B/L GI: BS positive Extremity:  No Edema Skin:   Wound C/D/I and Other Left heel normal Neuro: Alert/Oriented, Abnormal Sensory reduced sensation in finger tips and below ankle LLE, Normal Motor and Other L foot and bilateral hand intrinsic atrophy. Strength symmetrical Musc/Skel:  Other R BKA stump well healed mild edema Gen: NAD   Assessment/Plan: 1. Functional deficits secondary to R supracondylar femur fracture in a pt with R BKA, NWB x 6 weeks which require 3+ hours per day of interdisciplinary therapy in a comprehensive inpatient rehab setting. Physiatrist is providing close team supervision and 24 hour management of active medical problems listed below. Physiatrist and rehab team continue to assess barriers to discharge/monitor patient progress toward functional and medical goals. FIM: FIM - Bathing Bathing Steps Patient Completed: Chest;Right Arm;Left Arm;Abdomen;Front perineal area;Buttocks;Right upper leg;Left upper leg;Left lower leg (including foot) Bathing: 5: Set-up assist to: Obtain items  FIM - Upper Body Dressing/Undressing Upper body dressing/undressing steps patient completed: Thread/unthread right bra strap;Thread/unthread left bra strap;Hook/unhook bra;Thread/unthread right sleeve of pullover shirt/dresss;Thread/unthread left sleeve of pullover shirt/dress;Put head through opening of pull over shirt/dress;Pull shirt over trunk Upper body dressing/undressing: 5: Set-up assist to: Obtain clothing/put away FIM - Lower Body Dressing/Undressing Lower body dressing/undressing steps patient completed: Pull underwear up/down;Thread/unthread right underwear leg;Thread/unthread left underwear  leg;Thread/unthread right pants leg;Thread/unthread left pants leg;Pull pants up/down Lower body dressing/undressing: 5: Supervision: Safety issues/verbal cues  FIM - Toileting Toileting steps completed by patient: Performs perineal hygiene Toileting: 1: Two helpers  FIM - Diplomatic Services operational officer Devices: Sliding board;Grab bars Toilet Transfers: 1-Two helpers  FIM - Banker  Devices: Arm rests Bed/Chair Transfer: 4: Sit > Supine: Min A (steadying pt. > 75%/lift 1 leg);1: Bed > Chair or W/C: Total A (helper does all/Pt. < 25%);1: Chair or W/C > Bed: Total A (helper does all/Pt. < 25%)  FIM - Locomotion: Wheelchair Distance: 150 Locomotion: Wheelchair: 5: Travels 150 ft or more: maneuvers on rugs and over door sills with supervision, cueing or coaxing FIM - Locomotion: Ambulation Ambulation/Gait Assistance: Not tested (comment) Locomotion: Ambulation: 0: Activity did not occur  Comprehension Comprehension Mode: Auditory Comprehension: 5-Follows basic conversation/direction: With no assist  Expression Expression Mode: Verbal Expression: 5-Expresses basic needs/ideas: With no assist  Social Interaction Social Interaction: 6-Interacts appropriately with others with medication or extra time (anti-anxiety, antidepressant).  Problem Solving Problem Solving: 5-Solves basic problems: With no assist  Memory Memory: 4-Recognizes or recalls 75 - 89% of the time/requires cueing 10 - 24% of the time   Medical Problem List and Plan:  1. Right supracondylar femur fracture status post ORIF 03/01/2012 -2. DVT Prophylaxis/Anticoagulation: Subcutaneous Lovenox. Monitor platelet counts any signs of bleeding  3. Pain Management: Hydrocodone and Robaxin as needed. Monitor with increased mobility.  4. Neuropsych: This patient is capable of making decisions on his/her own behalf.  5. Postoperative anemia. Latest hemoglobin 8.6. Followup  CBC. Patient is asymptomatic  6. History right below-knee amputation 2010. Patient does have a prosthesis per [employer]  7. Diabetes mellitus with peripheral neuropathy. uncontrolledHemoglobin A1c 8.0.Increased Levemir 16 Unit BID Units for high am CBG ,. Titrate regimen as needed. restarted glucotrol.  CBG's generally much improved. Watch for significant/symptomatic hypoglycemia.  8. Hypertension. Clonidine 0.05 mg daily. Monitor with increased activity   9. Hyperlipidemia. Zocor  LOS (Days) 15 A FACE TO FACE EVALUATION WAS PERFORMED  Aslin Farinas T 03/19/2012, 8:24 AM

## 2012-03-19 NOTE — Progress Notes (Signed)
Physical Therapy Note  Patient Details  Name: Judith [name] MRN: [id number] Date of Birth: 08-14-1940 Today's Date: 03/19/2012  1300 -1330 (30 minutes) group ( pt missed 30 minutes secondary to bathroom needs) Pain: no reported pain Pt participated in PT group session focused on transfer training, UE strengthening/endurance Transfers - max assist wc setup for sliding board transfer; transfer to mat with sliding board with pt initiating slide with max tactile cues to lean away from direction of slide ; pt required max assist to complete slide to /from mat. Pt incontinent of stool and returned to room for hygiene; pt performed UE ergonometer X 10 minutes Level 1.    Aqeel Norgaard,JIM 03/19/2012, 2:38 PM

## 2012-03-20 ENCOUNTER — Inpatient Hospital Stay (HOSPITAL_COMMUNITY): Payer: Medicare Other

## 2012-03-20 LAB — GLUCOSE, CAPILLARY: Glucose-Capillary: 115 mg/dL — ABNORMAL HIGH (ref 70–99)

## 2012-03-20 NOTE — Progress Notes (Signed)
Occupational Therapy Note  Patient Details  Name: Judith [name] MRN: [id number] Date of Birth: August 28, 1940 Today's Date: 03/20/2012  Time: 2:15pm-2:45pm ( .) Pt seen for 1:1 OT session. Pt had just returned to bed after UE exercise group and she reported fatigue. Upon beginning to sit up at EOB, pt had bowel accident. Worked on bed mobility, rolling side to side during clean up. Increased time to clean up as pt required new pad on bed, new brief and gown. Once pt cleaned up, pulled herself up in bed using arms on side rails and mod A from therapist. Therapist suggested rehearsal of transfers from bed to wheelchair with pt refusing stating she was "beat." Pt reported no pain during session, only fatigue.    Nicko Daher Hessie Diener 03/20/2012, 2:50 PM

## 2012-03-20 NOTE — Progress Notes (Signed)
Patient ID: Judith [name], female   DOB: 1941-01-18, 72 y.o.   MRN: [id number] Subjective/Complaints: 72 y.o. right-handed female with history of insulin-dependent diabetes mellitus as well as right below-knee amputation 2010 and uses a prosthesis provided by [employer] as well as rolling walker. Patient is a resident of [facility]. Admitted 02/29/2012 after a recent fall at home without loss of consciousness. She was taken to the emergency room with findings of mildly displaced fracture of the distal femur placed in a splint maintain nonweightbearing status and discharged home. She presented to the orthopedic office for followup x-rays repeated showing a displaced right distal femoral fracture. Underwent ORIF 03/01/2012 per Dr. Carola Frost. She remains nonweightbearing x6 weeks  No complaints. Slept well again.   Review of Systems  Constitutional: Negative for fever and chills.  Respiratory: Negative for cough and shortness of breath.   Gastrointestinal: Negative for diarrhea and constipation.  Genitourinary: Negative for dysuria.  All other systems reviewed and are negative.    Objective: Vital Signs: Blood pressure 119/73, pulse 75, temperature 98.4 F (36.9 C), temperature source Oral, resp. rate 18, height 5\' 4"  (1.626 m), weight 90.357 kg (199 lb 3.2 oz), SpO2 97.00%. No results found. Results for orders placed during the hospital encounter of 03/04/12 (from the past 72 hour(s))  GLUCOSE, CAPILLARY     Status: Abnormal   Collection Time    03/17/12 11:51 AM      Result Value Range   Glucose-Capillary 245 (*) 70 - 99 mg/dL   Comment 1 Notify RN    GLUCOSE, CAPILLARY     Status: Abnormal   Collection Time    03/17/12  4:46 PM      Result Value Range   Glucose-Capillary 247 (*) 70 - 99 mg/dL   Comment 1 Notify RN    GLUCOSE, CAPILLARY     Status: Abnormal   Collection Time    03/17/12  8:28 PM      Result Value Range   Glucose-Capillary 258 (*) 70 -  99 mg/dL   Comment 1 Notify RN    CREATININE, SERUM     Status: Abnormal   Collection Time    03/18/12  5:40 AM      Result Value Range   Creatinine, Ser 0.78  0.50 - 1.10 mg/dL   GFR calc non Af Amer 82 (*) >90 mL/min   GFR calc Af Amer >90  >90 mL/min   Comment:            The eGFR has been calculated     using the CKD EPI equation.     This calculation has not been     validated in all clinical     situations.     eGFR's persistently     <90 mL/min signify     possible Chronic Kidney Disease.  GLUCOSE, CAPILLARY     Status: Abnormal   Collection Time    03/18/12  7:09 AM      Result Value Range   Glucose-Capillary 118 (*) 70 - 99 mg/dL   Comment 1 Notify RN    GLUCOSE, CAPILLARY     Status: Abnormal   Collection Time    03/18/12 11:53 AM      Result Value Range   Glucose-Capillary 166 (*) 70 - 99 mg/dL   Comment 1 Notify RN    GLUCOSE, CAPILLARY     Status: Abnormal   Collection Time    03/18/12  4:20 PM  Result Value Range   Glucose-Capillary 101 (*) 70 - 99 mg/dL   Comment 1 Notify RN    GLUCOSE, CAPILLARY     Status: Abnormal   Collection Time    03/18/12  8:56 PM      Result Value Range   Glucose-Capillary 162 (*) 70 - 99 mg/dL  GLUCOSE, CAPILLARY     Status: None   Collection Time    03/19/12  7:39 AM      Result Value Range   Glucose-Capillary 73  70 - 99 mg/dL  GLUCOSE, CAPILLARY     Status: Abnormal   Collection Time    03/19/12 11:48 AM      Result Value Range   Glucose-Capillary 139 (*) 70 - 99 mg/dL  GLUCOSE, CAPILLARY     Status: None   Collection Time    03/19/12  4:57 PM      Result Value Range   Glucose-Capillary 84  70 - 99 mg/dL  GLUCOSE, CAPILLARY     Status: Abnormal   Collection Time    03/19/12  9:06 PM      Result Value Range   Glucose-Capillary 115 (*) 70 - 99 mg/dL  GLUCOSE, CAPILLARY     Status: Abnormal   Collection Time    03/20/12  7:25 AM      Result Value Range   Glucose-Capillary 124 (*) 70 - 99 mg/dL     HEENT:  normal Cardio: RRR Resp: CTA B/L GI: BS positive Extremity:  No Edema Skin:   Wound C/D/I and Other Left heel normal Neuro: Alert/Oriented, Abnormal Sensory reduced sensation in finger tips and below ankle LLE, Normal Motor and Other L foot and bilateral hand intrinsic atrophy. Strength symmetrical Musc/Skel:  Other R BKA stump well healed mild edema Gen: NAD   Assessment/Plan: 1. Functional deficits secondary to R supracondylar femur fracture in a pt with R BKA, NWB x 6 weeks which require 3+ hours per day of interdisciplinary therapy in a comprehensive inpatient rehab setting. Physiatrist is providing close team supervision and 24 hour management of active medical problems listed below. Physiatrist and rehab team continue to assess barriers to discharge/monitor patient progress toward functional and medical goals. FIM: FIM - Bathing Bathing Steps Patient Completed: Chest;Right Arm;Left Arm;Abdomen;Front perineal area;Buttocks;Right upper leg;Left upper leg;Left lower leg (including foot) Bathing: 5: Set-up assist to: Obtain items  FIM - Upper Body Dressing/Undressing Upper body dressing/undressing steps patient completed: Thread/unthread right bra strap;Thread/unthread left bra strap;Hook/unhook bra;Thread/unthread right sleeve of pullover shirt/dresss;Thread/unthread left sleeve of pullover shirt/dress;Put head through opening of pull over shirt/dress;Pull shirt over trunk Upper body dressing/undressing: 5: Set-up assist to: Obtain clothing/put away FIM - Lower Body Dressing/Undressing Lower body dressing/undressing steps patient completed: Pull underwear up/down;Thread/unthread right underwear leg;Thread/unthread left underwear leg;Thread/unthread right pants leg;Thread/unthread left pants leg;Pull pants up/down Lower body dressing/undressing: 5: Supervision: Safety issues/verbal cues  FIM - Toileting Toileting steps completed by patient: Performs perineal hygiene Toileting: 1: Two  helpers  FIM - Diplomatic Services operational officer Devices: Sliding board;Grab bars Toilet Transfers: 1-Two helpers  FIM - Banker Devices: Arm rests Bed/Chair Transfer: 4: Sit > Supine: Min A (steadying pt. > 75%/lift 1 leg);1: Bed > Chair or W/C: Total A (helper does all/Pt. < 25%);1: Chair or W/C > Bed: Total A (helper does all/Pt. < 25%)  FIM - Locomotion: Wheelchair Distance: 150 Locomotion: Wheelchair: 5: Travels 150 ft or more: maneuvers on rugs and over door sills with supervision, cueing or  coaxing FIM - Locomotion: Ambulation Ambulation/Gait Assistance: Not tested (comment) Locomotion: Ambulation: 0: Activity did not occur  Comprehension Comprehension Mode: Auditory Comprehension: 5-Follows basic conversation/direction: With no assist  Expression Expression Mode: Verbal Expression: 5-Expresses basic needs/ideas: With no assist  Social Interaction Social Interaction: 6-Interacts appropriately with others with medication or extra time (anti-anxiety, antidepressant).  Problem Solving Problem Solving: 5-Solves basic problems: With no assist  Memory Memory: 5-Recognizes or recalls 90% of the time/requires cueing < 10% of the time   Medical Problem List and Plan:  1. Right supracondylar femur fracture status post ORIF 03/01/2012 -2. DVT Prophylaxis/Anticoagulation: Subcutaneous Lovenox. Monitor platelet counts any signs of bleeding  3. Pain Management: Hydrocodone and Robaxin as needed. Monitor with increased mobility.  4. Neuropsych: This patient is capable of making decisions on his/her own behalf.  5. Postoperative anemia. Latest hemoglobin 8.6. Followup CBC. Patient is asymptomatic  6. History right below-knee amputation 2010. Patient does have a prosthesis per [employer]  7. Diabetes mellitus with peripheral neuropathy. uncontrolledHemoglobin A1c 8.0.Increased Levemir 16 Unit BID Units for high am CBG ,.  Titrate regimen as needed. restarted glucotrol.  CBG's generally much improved. AM sugar better today. Consider HS snack 8. Hypertension. Clonidine 0.05 mg daily. Normotensive at present   9. Hyperlipidemia. Zocor  LOS (Days) 16 A FACE TO FACE EVALUATION WAS PERFORMED  Shiv Shuey T 03/20/2012, 7:58 AM

## 2012-03-21 ENCOUNTER — Inpatient Hospital Stay (HOSPITAL_COMMUNITY): Payer: Medicare Other | Admitting: Occupational Therapy

## 2012-03-21 ENCOUNTER — Inpatient Hospital Stay (HOSPITAL_COMMUNITY): Payer: Medicare Other | Admitting: Physical Therapy

## 2012-03-21 DIAGNOSIS — S72453A Displaced supracondylar fracture without intracondylar extension of lower end of unspecified femur, initial encounter for closed fracture: Secondary | ICD-10-CM

## 2012-03-21 DIAGNOSIS — S88119A Complete traumatic amputation at level between knee and ankle, unspecified lower leg, initial encounter: Secondary | ICD-10-CM

## 2012-03-21 DIAGNOSIS — E1365 Other specified diabetes mellitus with hyperglycemia: Secondary | ICD-10-CM

## 2012-03-21 DIAGNOSIS — E1349 Other specified diabetes mellitus with other diabetic neurological complication: Secondary | ICD-10-CM

## 2012-03-21 DIAGNOSIS — W19XXXA Unspecified fall, initial encounter: Secondary | ICD-10-CM

## 2012-03-21 LAB — GLUCOSE, CAPILLARY
Glucose-Capillary: 144 mg/dL — ABNORMAL HIGH (ref 70–99)
Glucose-Capillary: 294 mg/dL — ABNORMAL HIGH (ref 70–99)
Glucose-Capillary: 112 mg/dL — ABNORMAL HIGH (ref 70–99)

## 2012-03-21 NOTE — Progress Notes (Signed)
Occupational Therapy Session Note  Patient Details  Name: Judith [name] MRN: [id number] Date of Birth: [birth year]  Today's Date: 03/21/2012 Time: 6213-0865 Time Calculation (min): 29 min  Short Term Goals: Week 3:  OT Short Term Goal 1 (Week 3): Short term goals = Long term goals  Skilled Therapeutic Interventions/Progress Updates:    Pt's grandaughter present for family education on slide board transfers  bed<> BSC and WC<>elevated toilet. Demonstration and verbal cues provided for safe board placement and awareness of pt's center of gravity and positioning during transfer, also safe hand placement and blocking pt's knee for safety. Grandaughter completed transfers bed<>WC<>toilet with VC and some physical assist from clinician. Grandaughter would benefit from further instruction in order to complete transfers safely.   Therapy Documentation Precautions:  Precautions Precautions: Fall Precaution Comments: R BKA Restrictions Weight Bearing Restrictions: Yes RLE Weight Bearing: Non weight bearing Other Position/Activity Restrictions: NWB R LE    Pain: Pain Assessment Pain Assessment: No/denies pain  See FIM for current functional status  Therapy/Group: Individual Therapy  [name] 03/21/2012, 3:31 PM

## 2012-03-21 NOTE — Progress Notes (Signed)
Physical Therapy Session Note  Patient Details  Name: Judith [name] MRN: [id number] Date of Birth: 05-15-1940  Today's Date: 03/21/2012 Time: 1005-1051 and 1405-1500  Time Calculation (min): 46 min and 55 min  Short Term Goals: Week 1:  PT Short Term Goal 1 (Week 1): Patient will be able to perform bed mobility with S/min-A (has hospital bed at home) PT Short Term Goal 1 - Progress (Week 1): Partly met PT Short Term Goal 2 (Week 1): Patient will be able to perform transfers with Mod-Assist  PT Short Term Goal 2 - Progress (Week 1): Met PT Short Term Goal 3 (Week 1): Patient will be able to perform wheelchair mobility and management x 150' with S/Independent assist. PT Short Term Goal 3 - Progress (Week 1): Partly met Week 2:  PT Short Term Goal 1 (Week 2): = LTG min A overall w/c level   Skilled Therapeutic Interventions/Progress Updates:   Patient's grand daughter here for transfer training; performed w/c <> low couch and w/c <> hospital bed transfer training with slideboard with therapist demonstrating and verbalizing safe w/c placement, parts management, slideboard placement and sequencing of slideboard transfer on level and uneven surfaces with focus on patient's hand and trunk placement.  Granddaughter gave repeat demonstration of both transfers providing patient assistance with set up and min A during transfer for safety; grand daughter required min cues for w/c parts management during first transfer.  Also demonstrated and had grand daughter give repeat demonstration of assisting patient with scooting at EOB and sit <> supine with min A and use of hospital bed rails and overhead trapeze.  Discussed with granddaughter ways to make transfers more level and efficient (adding padding/cushions to couch, placing block under L foot to assist with pushing when scooting).  Patient left in bed to rest until afternoon session with OT.  Will continue transfer education this pm with daughter in law for  car transfer.    Patient's granddaughter present to assist with actual car transfer with daughter in law.  Demonstrated to daughter in law and granddaughter how to perform w/c set up, parts management, slideboard placement and sequencing of slideboard transfer into low car and verbal cues to give patient to assist with transfer.  Patient's family gave repeat demonstration of set up of w/c and slideboard; once patient set up patient is able to perform slideboard scooting and placing foot in and out of car with supervision and verbal cues for hand placement, trunk placement and sequencing.  No further questions from family.  Patient continued UE strengthening and endurance on UBE x 5 minutes forwards, 5 minutes backwards at random/variable resistances with one rest break.  Performed slideboard w/c > bed with min A overall uphill with use of bed rails; scooting to [facility] with mod A; sit > supine with bed rails and min A.    Therapy Documentation Precautions:  Precautions Precautions: Fall Precaution Comments: R BKA Restrictions Weight Bearing Restrictions: Yes RLE Weight Bearing: Non weight bearing Other Position/Activity Restrictions: NWB R LE Pain: Pain Assessment Pain Assessment: No/denies pain   See FIM for current functional status  Therapy/Group: Individual Therapy  Edman Circle [facility] 03/21/2012, 12:35 PM

## 2012-03-21 NOTE — Progress Notes (Signed)
Patient ID: Judith [name], female   DOB: 05-28-1940, 72 y.o.   MRN: [id number] Subjective/Complaints: 72 y.o. right-handed female with history of insulin-dependent diabetes mellitus as well as right below-knee amputation 2010 and uses a prosthesis provided by [employer] as well as rolling walker. Patient is a resident of [facility]. Admitted 02/29/2012 after a recent fall at home without loss of consciousness. She was taken to the emergency room with findings of mildly displaced fracture of the distal femur placed in a splint maintain nonweightbearing status and discharged home. She presented to the orthopedic office for followup x-rays repeated showing a displaced right distal femoral fracture. Underwent ORIF 03/01/2012 per Dr. Carola Frost. She remains nonweightbearing x6 weeks  No complaints. No pain in RLE   Review of Systems  Constitutional: Negative for fever and chills.  Respiratory: Negative for cough and shortness of breath.   Gastrointestinal: Negative for diarrhea and constipation.  Genitourinary: Negative for dysuria.  All other systems reviewed and are negative.    Objective: Vital Signs: Blood pressure 125/75, pulse 75, temperature 98.5 F (36.9 C), temperature source Oral, resp. rate 17, height 5\' 4"  (1.626 m), weight 90.357 kg (199 lb 3.2 oz), SpO2 99.00%. No results found. Results for orders placed during the hospital encounter of 03/04/12 (from the past 72 hour(s))  GLUCOSE, CAPILLARY     Status: Abnormal   Collection Time    03/18/12 11:53 AM      Result Value Range   Glucose-Capillary 166 (*) 70 - 99 mg/dL   Comment 1 Notify RN    GLUCOSE, CAPILLARY     Status: Abnormal   Collection Time    03/18/12  4:20 PM      Result Value Range   Glucose-Capillary 101 (*) 70 - 99 mg/dL   Comment 1 Notify RN    GLUCOSE, CAPILLARY     Status: Abnormal   Collection Time    03/18/12  8:56 PM      Result Value Range   Glucose-Capillary 162 (*) 70 - 99  mg/dL  GLUCOSE, CAPILLARY     Status: None   Collection Time    03/19/12  7:39 AM      Result Value Range   Glucose-Capillary 73  70 - 99 mg/dL  GLUCOSE, CAPILLARY     Status: Abnormal   Collection Time    03/19/12 11:48 AM      Result Value Range   Glucose-Capillary 139 (*) 70 - 99 mg/dL  GLUCOSE, CAPILLARY     Status: None   Collection Time    03/19/12  4:57 PM      Result Value Range   Glucose-Capillary 84  70 - 99 mg/dL  GLUCOSE, CAPILLARY     Status: Abnormal   Collection Time    03/19/12  9:06 PM      Result Value Range   Glucose-Capillary 115 (*) 70 - 99 mg/dL  GLUCOSE, CAPILLARY     Status: Abnormal   Collection Time    03/20/12  7:25 AM      Result Value Range   Glucose-Capillary 124 (*) 70 - 99 mg/dL  GLUCOSE, CAPILLARY     Status: Abnormal   Collection Time    03/20/12 12:07 PM      Result Value Range   Glucose-Capillary 156 (*) 70 - 99 mg/dL  GLUCOSE, CAPILLARY     Status: Abnormal   Collection Time    03/20/12  4:53 PM      Result Value  Range   Glucose-Capillary 115 (*) 70 - 99 mg/dL  GLUCOSE, CAPILLARY     Status: Abnormal   Collection Time    03/20/12  9:02 PM      Result Value Range   Glucose-Capillary 181 (*) 70 - 99 mg/dL     HEENT: normal Cardio: RRR Resp: CTA B/L GI: BS positive Extremity:  No Edema Skin:   Wound C/D/I and Other Left heel normal Neuro: Alert/Oriented, Abnormal Sensory reduced sensation in finger tips and below ankle LLE, Normal Motor and Other L foot and bilateral hand intrinsic atrophy. Strength symmetrical Musc/Skel:  Other R BKA stump well healed mild edema Gen: NAD   Assessment/Plan: 1. Functional deficits secondary to R supracondylar femur fracture in a pt with R BKA, NWB x 6 weeks which require 3+ hours per day of interdisciplinary therapy in a comprehensive inpatient rehab setting. Physiatrist is providing close team supervision and 24 hour management of active medical problems listed below. Physiatrist and rehab  team continue to assess barriers to discharge/monitor patient progress toward functional and medical goals. FIM: FIM - Bathing Bathing Steps Patient Completed: Chest;Right Arm;Left Arm;Abdomen;Front perineal area;Buttocks;Right upper leg;Left upper leg;Left lower leg (including foot) Bathing: 5: Set-up assist to: Obtain items  FIM - Upper Body Dressing/Undressing Upper body dressing/undressing steps patient completed: Thread/unthread right bra strap;Thread/unthread left bra strap;Hook/unhook bra;Thread/unthread right sleeve of pullover shirt/dresss;Thread/unthread left sleeve of pullover shirt/dress;Put head through opening of pull over shirt/dress;Pull shirt over trunk Upper body dressing/undressing: 5: Set-up assist to: Obtain clothing/put away FIM - Lower Body Dressing/Undressing Lower body dressing/undressing steps patient completed: Pull underwear up/down;Thread/unthread right underwear leg;Thread/unthread left underwear leg;Thread/unthread right pants leg;Thread/unthread left pants leg;Pull pants up/down Lower body dressing/undressing: 5: Supervision: Safety issues/verbal cues  FIM - Toileting Toileting steps completed by patient: Performs perineal hygiene Toileting: 1: Total-Patient completed zero steps, helper did all 3 (pt had incontinence of bowel at start of session)  FIM - Diplomatic Services operational officer Devices: Sliding board;Grab bars Toilet Transfers: 1-Two helpers  FIM - Banker Devices: Arm rests Bed/Chair Transfer: 4: Sit > Supine: Min A (steadying pt. > 75%/lift 1 leg);1: Bed > Chair or W/C: Total A (helper does all/Pt. < 25%);1: Chair or W/C > Bed: Total A (helper does all/Pt. < 25%)  FIM - Locomotion: Wheelchair Distance: 150 Locomotion: Wheelchair: 5: Travels 150 ft or more: maneuvers on rugs and over door sills with supervision, cueing or coaxing FIM - Locomotion: Ambulation Ambulation/Gait Assistance: Not tested  (comment) Locomotion: Ambulation: 0: Activity did not occur  Comprehension Comprehension Mode: Auditory Comprehension: 5-Follows basic conversation/direction: With no assist  Expression Expression Mode: Verbal Expression: 5-Expresses basic needs/ideas: With no assist  Social Interaction Social Interaction: 6-Interacts appropriately with others with medication or extra time (anti-anxiety, antidepressant).  Problem Solving Problem Solving: 5-Solves basic problems: With no assist  Memory Memory: 5-Recognizes or recalls 90% of the time/requires cueing < 10% of the time   Medical Problem List and Plan:  1. Right supracondylar femur fracture status post ORIF 03/01/2012 -2. DVT Prophylaxis/Anticoagulation: Subcutaneous Lovenox. Monitor platelet counts any signs of bleeding  3. Pain Management: Hydrocodone and Robaxin as needed. Monitor with increased mobility.  4. Neuropsych: This patient is capable of making decisions on his/her own behalf.  5. Postoperative anemia. Latest hemoglobin 8.6. Followup CBC. Patient is asymptomatic  6. History right below-knee amputation 2010. Patient does have a prosthesis per [employer]  7. Diabetes mellitus with peripheral neuropathy. uncontrolledHemoglobin A1c 8.0.Levemir 16 Unit  BID Units for high am CBG ,. Titrate regimen as needed.  Glucotrol at home dose 5mg .  CBG's generally much improved. AM sugar better today. Consider HS snack 8. Hypertension. Clonidine 0.05 mg daily. Normotensive at present   9. Hyperlipidemia. Zocor  LOS (Days) 17 A FACE TO FACE EVALUATION WAS PERFORMED  KIRSTEINS,ANDREW E 03/21/2012, 8:57 AM

## 2012-03-21 NOTE — Progress Notes (Signed)
Reviewed and agree with the attached treatment note.  Lynnlee Revels, OTR/L 

## 2012-03-21 NOTE — Progress Notes (Signed)
Occupational Therapy Session Note  Patient Details  Name: Judith [name] MRN: [id number] Date of Birth: Sep 25, 1940  Today's Date: 03/21/2012 Time: 0830-0929 Time Calculation (min): 59 min  Short Term Goals: Week 3:  OT Short Term Goal 1 (Week 3): Short term goals = Long term goals  Skilled Therapeutic Interventions/Progress Updates:    Pt seen for ADL retraining session with focus on slide board transfers and sit<>stand to increase participation in LB dressing. Pt completed LB bathing at bed level with setup. Pt completed bed mobility with supervision, transfer bed->WC with physical assist to place board and close supervision during transfer. UB bathing and dressing seated at sink with setup. LB dressing sit<>stand level with Mod-Max A to stand X2 due to reported fatigue and VC for sequencing. Pt's grandaughter present during final few minutes of session, plans to stay for afternoon session for family ed.   Therapy Documentation Precautions:  Precautions Precautions: Fall Precaution Comments: R BKA Restrictions Weight Bearing Restrictions: Yes RLE Weight Bearing: Non weight bearing Other Position/Activity Restrictions: NWB R LE    Pain: Pain Assessment Pain Assessment: No/denies pain  See FIM for current functional status  Therapy/Group: Individual Therapy  [name] 03/21/2012, 10:05 AM

## 2012-03-21 NOTE — Progress Notes (Signed)
Reviewed and agree with the attached treatment note.  Elinore Shults, OTR/L 

## 2012-03-22 ENCOUNTER — Inpatient Hospital Stay (HOSPITAL_COMMUNITY): Payer: Medicare Other | Admitting: *Deleted

## 2012-03-22 ENCOUNTER — Inpatient Hospital Stay (HOSPITAL_COMMUNITY): Payer: Medicare Other | Admitting: Physical Therapy

## 2012-03-22 ENCOUNTER — Inpatient Hospital Stay (HOSPITAL_COMMUNITY): Payer: Medicare Other | Admitting: Occupational Therapy

## 2012-03-22 LAB — GLUCOSE, CAPILLARY
Glucose-Capillary: 136 mg/dL — ABNORMAL HIGH (ref 70–99)
Glucose-Capillary: 104 mg/dL — ABNORMAL HIGH (ref 70–99)
Glucose-Capillary: 137 mg/dL — ABNORMAL HIGH (ref 70–99)

## 2012-03-22 NOTE — Progress Notes (Signed)
Patient ID: Judith [name], female   DOB: [birth year], 72 y.o.   MRN: [id number] Subjective/Complaints: 72 y.o. right-handed female with history of insulin-dependent diabetes mellitus as well as right below-knee amputation 2010 and uses a prosthesis provided by [employer] as well as rolling walker. Patient is a resident of [facility]. Admitted 02/29/2012 after a recent fall at home without loss of consciousness. She was taken to the emergency room with findings of mildly displaced fracture of the distal femur placed in a splint maintain nonweightbearing status and discharged home. She presented to the orthopedic office for followup x-rays repeated showing a displaced right distal femoral fracture. Underwent ORIF 03/01/2012 per Dr. Carola Frost. She remains nonweightbearing x6 weeks  No complaints. No pain in RLE, discussed diet and upcoming D/C   Review of Systems  Constitutional: Negative for fever and chills.  Respiratory: Negative for cough and shortness of breath.   Gastrointestinal: Negative for diarrhea and constipation.  Genitourinary: Negative for dysuria.  All other systems reviewed and are negative.    Objective: Vital Signs: Blood pressure 127/77, pulse 76, temperature 98.2 F (36.8 C), temperature source Oral, resp. rate 18, height 5\' 4"  (1.626 m), weight 90.357 kg (199 lb 3.2 oz), SpO2 97.00%. No results found. Results for orders placed during the hospital encounter of 03/04/12 (from the past 72 hour(s))  GLUCOSE, CAPILLARY     Status: Abnormal   Collection Time    03/19/12 11:48 AM      Result Value Range   Glucose-Capillary 139 (*) 70 - 99 mg/dL  GLUCOSE, CAPILLARY     Status: None   Collection Time    03/19/12  4:57 PM      Result Value Range   Glucose-Capillary 84  70 - 99 mg/dL  GLUCOSE, CAPILLARY     Status: Abnormal   Collection Time    03/19/12  9:06 PM      Result Value Range   Glucose-Capillary 115 (*) 70 - 99 mg/dL  GLUCOSE, CAPILLARY      Status: Abnormal   Collection Time    03/20/12  7:25 AM      Result Value Range   Glucose-Capillary 124 (*) 70 - 99 mg/dL  GLUCOSE, CAPILLARY     Status: Abnormal   Collection Time    03/20/12 12:07 PM      Result Value Range   Glucose-Capillary 156 (*) 70 - 99 mg/dL  GLUCOSE, CAPILLARY     Status: Abnormal   Collection Time    03/20/12  4:53 PM      Result Value Range   Glucose-Capillary 115 (*) 70 - 99 mg/dL  GLUCOSE, CAPILLARY     Status: Abnormal   Collection Time    03/20/12  9:02 PM      Result Value Range   Glucose-Capillary 181 (*) 70 - 99 mg/dL  GLUCOSE, CAPILLARY     Status: Abnormal   Collection Time    03/21/12  7:19 AM      Result Value Range   Glucose-Capillary 144 (*) 70 - 99 mg/dL   Comment 1 Notify RN    GLUCOSE, CAPILLARY     Status: Abnormal   Collection Time    03/21/12 11:25 AM      Result Value Range   Glucose-Capillary 294 (*) 70 - 99 mg/dL   Comment 1 Notify RN    GLUCOSE, CAPILLARY     Status: None   Collection Time    03/21/12  4:53 PM  Result Value Range   Glucose-Capillary 86  70 - 99 mg/dL  GLUCOSE, CAPILLARY     Status: Abnormal   Collection Time    03/21/12  9:19 PM      Result Value Range   Glucose-Capillary 112 (*) 70 - 99 mg/dL  GLUCOSE, CAPILLARY     Status: Abnormal   Collection Time    03/22/12  7:10 AM      Result Value Range   Glucose-Capillary 155 (*) 70 - 99 mg/dL   Comment 1 Notify RN       HEENT: normal Cardio: RRR Resp: CTA B/L GI: BS positive Extremity:  No Edema Skin:   Wound C/D/I and Other Left heel normal Neuro: Alert/Oriented, Abnormal Sensory reduced sensation in finger tips and below ankle LLE, Normal Motor and Other L foot and bilateral hand intrinsic atrophy. Strength symmetrical Musc/Skel:  Other R BKA stump well healed mild edema Gen: NAD   Assessment/Plan: 1. Functional deficits secondary to R supracondylar femur fracture in a pt with R BKA, NWB x 6 weeks which require 3+ hours per day of  interdisciplinary therapy in a comprehensive inpatient rehab setting. Physiatrist is providing close team supervision and 24 hour management of active medical problems listed below. Physiatrist and rehab team continue to assess barriers to discharge/monitor patient progress toward functional and medical goals. FIM: FIM - Bathing Bathing Steps Patient Completed: Chest;Right Arm;Left Arm;Abdomen;Front perineal area;Buttocks;Right upper leg;Left upper leg;Left lower leg (including foot) Bathing: 5: Set-up assist to: Obtain items  FIM - Upper Body Dressing/Undressing Upper body dressing/undressing steps patient completed: Thread/unthread right bra strap;Thread/unthread left bra strap;Hook/unhook bra;Thread/unthread right sleeve of pullover shirt/dresss;Thread/unthread left sleeve of pullover shirt/dress;Put head through opening of pull over shirt/dress;Pull shirt over trunk Upper body dressing/undressing: 5: Set-up assist to: Obtain clothing/put away FIM - Lower Body Dressing/Undressing Lower body dressing/undressing steps patient completed: Thread/unthread right pants leg;Thread/unthread left pants leg;Don/Doff left sock;Don/Doff left shoe Lower body dressing/undressing: 3: Mod-Patient completed 50-74% of tasks  FIM - Toileting Toileting steps completed by patient: Performs perineal hygiene Toileting: 1: Total-Patient completed zero steps, helper did all 3 (pt had incontinence of bowel at start of session)  FIM - Diplomatic Services operational officer Devices: Sliding board;Grab bars Toilet Transfers: 1-Two helpers  FIM - Banker Devices: Arm rests;Sliding board Bed/Chair Transfer: 4: Supine > Sit: Min A (steadying Pt. > 75%/lift 1 leg);4: Sit > Supine: Min A (steadying pt. > 75%/lift 1 leg);4: Bed > Chair or W/C: Min A (steadying Pt. > 75%);4: Chair or W/C > Bed: Min A (steadying Pt. > 75%)  FIM - Locomotion: Wheelchair Distance: 150 Locomotion:  Wheelchair: 5: Travels 150 ft or more: maneuvers on rugs and over door sills with supervision, cueing or coaxing FIM - Locomotion: Ambulation Ambulation/Gait Assistance: Not tested (comment) Locomotion: Ambulation: 0: Activity did not occur  Comprehension Comprehension Mode: Auditory Comprehension: 4-Understands basic 75 - 89% of the time/requires cueing 10 - 24% of the time  Expression Expression Mode: Verbal Expression: 3-Expresses basic 50 - 74% of the time/requires cueing 25 - 50% of the time. Needs to repeat parts of sentences.  Social Interaction Social Interaction: 4-Interacts appropriately 75 - 89% of the time - Needs redirection for appropriate language or to initiate interaction.  Problem Solving Problem Solving: 3-Solves basic 50 - 74% of the time/requires cueing 25 - 49% of the time  Memory Memory: 4-Recognizes or recalls 75 - 89% of the time/requires cueing 10 - 24% of the time  Medical Problem List and Plan:  1. Right supracondylar femur fracture status post ORIF 03/01/2012 -2. DVT Prophylaxis/Anticoagulation: Subcutaneous Lovenox. Monitor platelet counts any signs of bleeding  3. Pain Management: Hydrocodone and Robaxin as needed. Monitor with increased mobility.  4. Neuropsych: This patient is capable of making decisions on his/her own behalf.  5. Postoperative anemia. Latest hemoglobin 8.6. Followup CBC. Patient is asymptomatic  6. History right below-knee amputation 2010. Patient does have a prosthesis per [employer]  7. Diabetes mellitus with peripheral neuropathy. uncontrolledHemoglobin A1c 8.0.Levemir 16 Unit BID Units for high am CBG ,. Titrate regimen as needed.  Glucotrol at home dose 5mg .  CBG's generally much improved. One CBG elevated before noon yest, pt doesn't recall any sweets 8. Hypertension. Clonidine 0.05 mg daily. Normotensive at present   9. Hyperlipidemia. Zocor  LOS (Days) 18 A FACE TO FACE EVALUATION WAS  PERFORMED  Upton Russey E 03/22/2012, 8:24 AM

## 2012-03-22 NOTE — Progress Notes (Signed)
Occupational Therapy Discharge Summary  Patient Details  Name: Judith [name] MRN: [id number] Date of Birth: 15-Aug-1940  Today's Date: 03/22/2012 Time: 7829-5621 Time Calculation (min): 59 min  Patient has met 6 of 6 long term goals due to improved activity tolerance.  Patient to discharge at [facility] Assist level.  Patient's care partner is independent to provide the necessary physical and cognitive assistance at discharge.  Granddaughter, Angelena Sole, to provide physical assistance with functional transfers and self-care tasks at night until personal care aid comes around 11AM.  Reasons goals not met: N/A  Recommendation:  Patient will benefit from ongoing skilled OT services in home health setting to continue to advance functional skills in the area of BADL and Reduce care partner burden.  Equipment: drop arm BSC  Reasons for discharge: treatment goals met and discharge from hospital  Patient/family agrees with progress made and goals achieved: Yes  OT Discharge Precautions/Restrictions  Restrictions Weight Bearing Restrictions: Yes RLE Weight Bearing: Non weight bearing ADL  Min assist overall Cognition Orientation Level: Oriented to person;Oriented to place Extremity/Trunk Assessment RUE Assessment RUE Assessment: Within Functional Limits LUE Assessment LUE Assessment: Within Functional Limits  See FIM for current functional status  [name] 03/22/2012, 12:01 PM

## 2012-03-22 NOTE — Discharge Summary (Signed)
  Discharge summary job # [id number]

## 2012-03-22 NOTE — Progress Notes (Signed)
Occupational Therapy Session Note  Patient Details  Name: Judith [name] MRN: [id number] Date of Birth: 05-10-1940  Today's Date: 03/22/2012 Time: 6644-0347 Time Calculation (min): 44 min  Short Term Goals: Week 3:  OT Short Term Goal 1 (Week 3): Short term goals = Long term goals  Skilled Therapeutic Interventions/Progress Updates:    Pt seen for 1:1 OT session with focus on transfers, sit<>stand and pt education on BUE home exercise program. Pt reported need to use [facility] upon arrival. Slide board transfer WC<>BSC with Min A to place board and close supervision with VC for hand placement during transfer. Doffed pants/brief in sitting using lateral leans. Recommend caregiver use lateral leans for LB dressing at home for safety due to pt's inconsistency with ability to stand. Sit<>stand to pull up pants X2 with Mod A and VC for sequencing/hand placement. Pt completed BUE exercises using theraband with verbal instruction and demonstration for positioning. Plan to provide handout for home exercise program next session.   Therapy Documentation Precautions:  Precautions Precautions: Fall Precaution Comments: R BKA Restrictions Weight Bearing Restrictions: Yes RLE Weight Bearing: Non weight bearing Other Position/Activity Restrictions: NWB R LE    Pain: Pain Assessment Pain Assessment: No/denies pain Pain Score: 0-No pain  See FIM for current functional status  Therapy/Group: Individual Therapy  [name] 03/22/2012, 3:26 PM

## 2012-03-22 NOTE — Progress Notes (Signed)
Physical Therapy Discharge Summary  Patient Details  Name: Judith [name] MRN: [id number] Date of Birth: 02/16/1940  Today's Date: 03/22/2012 Time: 4098-1191 Time Calculation (min): 60 min  Patient has met 7 of 7 long term goals due to improved activity tolerance, improved balance, improved postural control, increased strength and functional use of  right upper extremity, left upper extremity and left lower extremity.  Patient to discharge at a wheelchair level of supervision for w/c mobility and Min Assist for functional transfers and static standing   Patient's care partner is independent to provide the necessary physical assistance at discharge.  Reasons goals not met: All goals met  Recommendation:  Patient will benefit from ongoing skilled PT services in home health setting to continue to advance safe functional mobility, address ongoing impairments in bilat UE and LE impaired strength, activity tolerance and endurance, postural control, dynamic balance, gait with prosthesis once WB status upgraded, and minimize fall risk.  Equipment: No equipment provided  Reasons for discharge: treatment goals met and discharge from hospital  Patient/family agrees with progress made and goals achieved: Yes  PT Discharge Precautions/Restrictions Precautions Precautions: Fall Precaution Comments: R BKA Restrictions Weight Bearing Restrictions: Yes RLE Weight Bearing: Non weight bearing Other Position/Activity Restrictions: NWB R LE Pain Pain Assessment Pain Assessment: No/denies pain Pain Score: 0-No pain Vision/Perception  Perception Perception: Not tested Praxis Praxis: Not tested  Sensation Sensation Light Touch: Impaired Detail Light Touch Impaired Details: Impaired LLE (Numbness in L foot, WFL rest of LLE and RLE) Stereognosis: Not tested Hot/Cold: Not tested Proprioception: Not tested Coordination Gross Motor Movements are Fluid and Coordinated: Not tested Fine Motor  Movements are Fluid and Coordinated: Not tested Motor  Motor Motor: Abnormal postural alignment and control Motor - Discharge Observations: Generalized weakness; sits in trunk flexion, rounded shoulders and posterior pelvic tilt; increased difficulty with trunk, L hip and knee terminal extension  Mobility Bed Mobility Bed Mobility: Sit to Supine;Supine to Sit Supine to Sit: With rails;HOB elevated;5: Supervision Sit to Supine: 4: Min assist;With rail Sit to Supine - Details (indicate cue type and reason): Requires use of UE on bed rails and min A to bring LLE fully into bed.  Verbal cues for initiation and sequencing Transfers Sit to Stand: 4: Min assist Sit to Stand Details (indicate cue type and reason): Performed sit <> stand from elevated mat x reps with min A overall to stand and min A to maintain static standing with verbal and tactile cues for upright posture, L hip and knee full extension.  Able to release RW with LUE to reach and touch targets out of BOS with min A; unable to release RUE.  During standing performed shoulder depression for latissimus activation and RLE open chain hip extension and hip ABD x 10 reps each and 10 reps mini squats with bilat UE support and min A.   Stand to Sit: 4: Min Designer, television/film set Transfers: 2: Max Designer, industrial/product Details (indicate cue type and reason): Max A for lifting, lowering assistance and for full pivot to next surface. Lateral/Scoot Transfers: 5: Supervision;With slide board Lateral/Scoot Transfer Details (indicate cue type and reason): Requires assistance for set up and sequencing of slideboard transfers on level surfaces with verbal cues for hand placement, and use of anterior and lateral leaning head/hips relationship to advance buttocks across board.   Locomotion  Ambulation Ambulation: No Ambulation/Gait Assistance: Not tested (comment) Stairs / Additional Locomotion Stairs: No Wheelchair Mobility Wheelchair Mobility:  Yes Wheelchair Assistance: 5:  Supervision Wheelchair Assistance Details: Verbal cues for precautions/safety Wheelchair Propulsion: Both upper extremities Wheelchair Parts Management: Needs assistance Distance: 150 with extra time and verbal cues for UE propulsion sequence for turning   Trunk/Postural Assessment  Cervical Assessment Cervical Assessment: Within Functional Limits Thoracic Assessment Thoracic Assessment: Exceptions to [facility] (rests in thoracic flexion) Lumbar Assessment Lumbar Assessment: Exceptions to [facility] (rests in posterior pelvic tilt in sitting) Postural Control Trunk Control: increased difficulty with trunk and hip extension in standing; difficulty with trunk elongation during anterior lean to bring COG over BOS for scooting or coming to squat  Balance Static Sitting Balance Static Sitting - Balance Support: No upper extremity supported;Feet supported Static Sitting - Level of Assistance: 6: Modified independent (Device/Increase time) Dynamic Sitting Balance Dynamic Sitting - Balance Support: Right upper extremity supported;Left upper extremity supported;Feet supported Dynamic Sitting - Level of Assistance: 5: Stand by assistance Static Standing Balance Static Standing - Balance Support: Bilateral upper extremity supported;Right upper extremity supported Static Standing - Level of Assistance: 4: Min assist Static Standing - Comment/# of Minutes: 1 minute total Extremity Assessment  RUE Assessment RUE Assessment: Within Functional Limits LUE Assessment LUE Assessment: Within Functional Limits RLE Assessment RLE Assessment: Exceptions to [facility] RLE Strength RLE Overall Strength: Deficits;Due to precautions RLE Overall Strength Comments: NWB; 3/5 overall, no resistance given secondary to WB precautions LLE Assessment LLE Assessment: Exceptions to [facility] LLE Strength LLE Overall Strength: Deficits;Due to premorbid status LLE Overall Strength Comments: 4-/5 overall, poor  muscular endurance  Performed bilat hip flexor and anterior chest stretch reclined in supine on mat with LE hanging off mat for prolonged period of time; added in quad stretch with PROM knee flexion bilaterally, L hip stretch into IR.  Performed upper back strengthening with 10 reps scap retractions and shoulder depressions and hip extensor strengthening with glute sets x 10 reps.    See FIM for current functional status  Edman Circle [facility] 03/22/2012, 2:49 PM

## 2012-03-22 NOTE — Progress Notes (Signed)
NUTRITION FOLLOW UP  DOCUMENTATION CODES  Per approved criteria   -Obesity Unspecified    Intervention:   1. Continue Glucerna Shake po daily, each supplement provides 220 kcal and 10 grams of protein, if intake of meals declines.  2. RD to continue to follow nutrition care plan  Nutrition Dx:   Increased nutrient needs related to post-op healing as evidenced by estimated needs. Ongoing.  Goal:   Pt to meet >/= 90% of their estimated nutrition needs. Met.  Monitor:   weight trends, lab trends, I/O's, PO intake, supplement tolerance  Assessment:   S/p BKA in 2010. Admitted for surgery for her supracondylar/intracondylar femoral fracture s/p fall. Pt was seen by RD staff during acute hospitalization. Underwent ORIF 2/4.  Meal intake remains excellent, consuming 100%. Continues with order for Glucerna Shake daily. Discussed educational needs - she reports that she has had DM for "several years" and knows what she needs to do. Declined further education at this time.  Height: Ht Readings from Last 1 Encounters:  03/04/12 5\' 4"  (1.626 m)    Weight Status:   Wt Readings from Last 1 Encounters:  03/04/12 199 lb 3.2 oz (90.357 kg)  No new weight since admission.  Re-estimated needs:  Kcal: 1600 - 1700 Protein: 80 - 90 Fluid: 1.8 - 2  Skin: R thigh incision  Diet Order: Carb Control Medium (1600-2000)   Intake/Output Summary (Last 24 hours) at 03/22/12 1029 Last data filed at 03/22/12 0900  Gross per 24 hour  Intake    720 ml  Output    350 ml  Net    370 ml    Last BM: 2/23   Labs:   Recent Labs Lab 03/18/12 0540  CREATININE 0.78    CBG (last 3)   Recent Labs  03/21/12 1653 03/21/12 2119 03/22/12 0710  GLUCAP 86 112* 155*    Scheduled Meds: . cholecalciferol  1,000 Units Oral Daily  . cloNIDine  0.05 mg Oral Daily  . enoxaparin (LOVENOX) injection  40 mg Subcutaneous Q24H  . feeding supplement  237 mL Oral Daily  . glipiZIDE  5 mg Oral QAC  breakfast  . insulin aspart  0-9 Units Subcutaneous TID WC  . insulin detemir  16 Units Subcutaneous BID  . multivitamin with minerals  1 tablet Oral Daily  . pantoprazole  40 mg Oral Daily  . senna-docusate  2 tablet Oral QHS  . simvastatin  40 mg Oral QPM    Continuous Infusions:  none  Jarold Motto MS, RD, LDN Pager: [phone] After-hours pager: [phone]

## 2012-03-22 NOTE — Progress Notes (Signed)
Physical Therapy Session Note  Patient Details  Name: Judith [name] MRN: [id number] Date of Birth: 08-Jun-1940  Today's Date: 03/22/2012 Time: 1130-1200 Time Calculation (min): 30 min  Short Term Goals: Week 2:  PT Short Term Goal 1 (Week 2): = LTG min A overall w/c level   Skilled Therapeutic Interventions/Progress Updates:    Patient received sitting in wheelchair. Today's session focused on slideboard transfers wheelchair<>sofa and wheelchair mobility in controlled and home environments and on tile and on carpet. Patient instructed in wheelchair mobility 150' (50' on carpet) with supervision. Patient requires verbal cues when negotiating transition from tile to carpet. Patient performs slide board transfer wheelchair<>sofa x2 with supervision, but requires assistance for proper placement of slide board. Patient requires verbal cues for proper sequencing and technique.  Patient returned to room and left seated in wheelchair with all needs within reach.  Therapy Documentation Precautions:  Precautions Precautions: Fall Precaution Comments: R BKA Restrictions Weight Bearing Restrictions: Yes RLE Weight Bearing: Non weight bearing Other Position/Activity Restrictions: NWB R LE Pain: Pain Assessment Pain Assessment: No/denies pain Pain Score: 0-No pain Locomotion : Ambulation Ambulation/Gait Assistance: Not tested (comment) Wheelchair Mobility Wheelchair Mobility: Yes Wheelchair Assistance: 5: Supervision;Other (comment) (increased time to complete) Wheelchair Assistance Details: Verbal cues for precautions/safety Wheelchair Propulsion: Both upper extremities Wheelchair Parts Management: Needs assistance Distance: 150   See FIM for current functional status  Therapy/Group: Individual Therapy  Chipper Herb. Perris Tripathi, PT, DPT  03/22/2012, 1:53 PM

## 2012-03-22 NOTE — Progress Notes (Signed)
Reviewed and agree with the attached treatment note.  Stavros Cail, OTR/L 

## 2012-03-22 NOTE — Progress Notes (Signed)
Occupational Therapy Session Note  Patient Details  Name: Judith [name] MRN: [id number] Date of Birth: [birth year]  Today's Date: 03/22/2012 Time: 5621-3086 Time Calculation (min): 59 min  Short Term Goals: Week 3:  OT Short Term Goal 1 (Week 3): Short term goals = Long term goals  Skilled Therapeutic Interventions/Progress Updates:    Pt seen for family education session with granddaughter, who was aware of appointment, and agreed to attend however did not show/call. ADL session completed with overall setup-Min A. Perineal care completed and pull-up brief donned at bed level with setup, as PTA. Slide board transfer with Min A to place board and close supervision during transfer. UB bathing and dressing seated at sink with setup. Pants donned in sitting using lateral leans with Min A to pull up pants.   Therapy Documentation Precautions:  Precautions Precautions: Fall Precaution Comments: R BKA Restrictions Weight Bearing Restrictions: Yes RLE Weight Bearing: Non weight bearing Other Position/Activity Restrictions: NWB R LE    Pain: No pain reported this session    See FIM for current functional status  Therapy/Group: Individual Therapy  [name] 03/22/2012, 11:45 AM

## 2012-03-22 NOTE — Progress Notes (Signed)
Reviewed and agree with the attached treatment note.  Shaneque Merkle, OTR/L 

## 2012-03-23 ENCOUNTER — Encounter (HOSPITAL_COMMUNITY): Payer: Medicare Other | Admitting: Occupational Therapy

## 2012-03-23 MED ORDER — SIMVASTATIN 40 MG PO TABS: 40.0000 mg | ORAL_TABLET | Freq: Every evening | ORAL | Status: DC

## 2012-03-23 MED ORDER — SENNOSIDES-DOCUSATE SODIUM 8.6-50 MG PO TABS: 2.0000 | ORAL_TABLET | Freq: Every day | ORAL | Status: DC

## 2012-03-23 MED ORDER — CLONIDINE HCL 0.1 MG PO TABS: 0.0500 mg | ORAL_TABLET | Freq: Every day | ORAL | Status: DC

## 2012-03-23 MED ORDER — OMEPRAZOLE 20 MG PO CPDR: 20.0000 mg | DELAYED_RELEASE_CAPSULE | Freq: Every day | ORAL | Status: DC | PRN

## 2012-03-23 MED ORDER — INSULIN DETEMIR 100 UNIT/ML ~~LOC~~ SOLN: 16.0000 [IU] | Freq: Two times a day (BID) | SUBCUTANEOUS | Status: DC

## 2012-03-23 MED ORDER — METHOCARBAMOL 500 MG PO TABS: 500.0000 mg | ORAL_TABLET | Freq: Four times a day (QID) | ORAL | Status: DC | PRN

## 2012-03-23 MED ORDER — GLIPIZIDE ER 5 MG PO TB24: 5.0000 mg | ORAL_TABLET | Freq: Every day | ORAL | Status: DC

## 2012-03-23 MED ORDER — ADULT MULTIVITAMIN W/MINERALS CH: 1.0000 | ORAL_TABLET | Freq: Every day | ORAL | Status: DC

## 2012-03-23 MED ORDER — VITAMIN D3 25 MCG (1000 UNIT) PO TABS: 1000.0000 [IU] | ORAL_TABLET | Freq: Every day | ORAL | Status: DC

## 2012-03-23 NOTE — Progress Notes (Signed)
Patient ID: Judith [name], female   DOB: 05/20/1940, 72 y.o.   MRN: [id number] Subjective/Complaints: 72 y.o. right-handed female with history of insulin-dependent diabetes mellitus as well as right below-knee amputation 2010 and uses a prosthesis provided by [employer] as well as rolling walker. Patient is a resident of [facility]. Admitted 02/29/2012 after a recent fall at home without loss of consciousness. She was taken to the emergency room with findings of mildly displaced fracture of the distal femur placed in a splint maintain nonweightbearing status and discharged home. She presented to the orthopedic office for followup x-rays repeated showing a displaced right distal femoral fracture. Underwent ORIF 03/01/2012 per Dr. Carola Frost. She remains nonweightbearing x6 weeks  No complaints. No pain in RLE, family here for D/C   Review of Systems  Constitutional: Negative for fever and chills.  Respiratory: Negative for cough and shortness of breath.   Gastrointestinal: Negative for diarrhea and constipation.  Genitourinary: Negative for dysuria.  All other systems reviewed and are negative.    Objective: Vital Signs: Blood pressure 106/58, pulse 78, temperature 98.4 F (36.9 C), temperature source Oral, resp. rate 18, height 5\' 4"  (1.626 m), weight 90.357 kg (199 lb 3.2 oz), SpO2 95.00%. No results found. Results for orders placed during the hospital encounter of 03/04/12 (from the past 72 hour(s))  GLUCOSE, CAPILLARY     Status: Abnormal   Collection Time    03/20/12 12:07 PM      Result Value Range   Glucose-Capillary 156 (*) 70 - 99 mg/dL  GLUCOSE, CAPILLARY     Status: Abnormal   Collection Time    03/20/12  4:53 PM      Result Value Range   Glucose-Capillary 115 (*) 70 - 99 mg/dL  GLUCOSE, CAPILLARY     Status: Abnormal   Collection Time    03/20/12  9:02 PM      Result Value Range   Glucose-Capillary 181 (*) 70 - 99 mg/dL  GLUCOSE, CAPILLARY      Status: Abnormal   Collection Time    03/21/12  7:19 AM      Result Value Range   Glucose-Capillary 144 (*) 70 - 99 mg/dL   Comment 1 Notify RN    GLUCOSE, CAPILLARY     Status: Abnormal   Collection Time    03/21/12 11:25 AM      Result Value Range   Glucose-Capillary 294 (*) 70 - 99 mg/dL   Comment 1 Notify RN    GLUCOSE, CAPILLARY     Status: None   Collection Time    03/21/12  4:53 PM      Result Value Range   Glucose-Capillary 86  70 - 99 mg/dL  GLUCOSE, CAPILLARY     Status: Abnormal   Collection Time    03/21/12  9:19 PM      Result Value Range   Glucose-Capillary 112 (*) 70 - 99 mg/dL  GLUCOSE, CAPILLARY     Status: Abnormal   Collection Time    03/22/12  7:10 AM      Result Value Range   Glucose-Capillary 155 (*) 70 - 99 mg/dL   Comment 1 Notify RN    GLUCOSE, CAPILLARY     Status: Abnormal   Collection Time    03/22/12 11:29 AM      Result Value Range   Glucose-Capillary 136 (*) 70 - 99 mg/dL   Comment 1 Notify RN    GLUCOSE, CAPILLARY  Status: Abnormal   Collection Time    03/22/12  4:55 PM      Result Value Range   Glucose-Capillary 104 (*) 70 - 99 mg/dL  GLUCOSE, CAPILLARY     Status: Abnormal   Collection Time    03/22/12  9:26 PM      Result Value Range   Glucose-Capillary 137 (*) 70 - 99 mg/dL  GLUCOSE, CAPILLARY     Status: Abnormal   Collection Time    03/23/12  7:11 AM      Result Value Range   Glucose-Capillary 135 (*) 70 - 99 mg/dL   Comment 1 Notify RN       HEENT: normal Cardio: RRR Resp: CTA B/L GI: BS positive Extremity:  No Edema Skin:   Wound C/D/I and Other Left heel normal Neuro: Alert/Oriented, Abnormal Sensory reduced sensation in finger tips and below ankle LLE, Normal Motor and Other L foot and bilateral hand intrinsic atrophy. Strength symmetrical Musc/Skel:  Other R BKA stump well healed mild edema Gen: NAD   Assessment/Plan: 1. Functional deficits secondary to R supracondylar femur fracture in a pt with R BKA, NWB  x 6 weeks which require 3+ hours per day of interdisciplinary therapy in a comprehensive inpatient rehab setting. Physiatrist is providing close team supervision and 24 hour management of active medical problems listed below. Physiatrist and rehab team continue to assess barriers to discharge/monitor patient progress toward functional and medical goals. FIM: FIM - Bathing Bathing Steps Patient Completed: Chest;Right Arm;Left Arm;Abdomen;Front perineal area;Buttocks;Right upper leg;Left upper leg;Left lower leg (including foot) Bathing: 5: Set-up assist to: Obtain items  FIM - Upper Body Dressing/Undressing Upper body dressing/undressing steps patient completed: Thread/unthread right bra strap;Thread/unthread left bra strap;Hook/unhook bra;Thread/unthread right sleeve of pullover shirt/dresss;Thread/unthread left sleeve of pullover shirt/dress;Put head through opening of pull over shirt/dress;Pull shirt over trunk Upper body dressing/undressing: 5: Set-up assist to: Obtain clothing/put away FIM - Lower Body Dressing/Undressing Lower body dressing/undressing steps patient completed: Thread/unthread right underwear leg;Thread/unthread left underwear leg;Pull underwear up/down;Thread/unthread right pants leg;Thread/unthread left pants leg;Pull pants up/down;Don/Doff left sock;Don/Doff left shoe;Fasten/unfasten left shoe Lower body dressing/undressing: 4: Min-Patient completed 75 plus % of tasks  FIM - Toileting Toileting steps completed by patient: Performs perineal hygiene Toileting: 1: Total-Patient completed zero steps, helper did all 3 (pt had incontinence of bowel at start of session)  FIM - Diplomatic Services operational officer Devices: Sliding board;Grab bars Toilet Transfers: 1-Two helpers  FIM - Banker Devices: Arm rests;Sliding board Bed/Chair Transfer: 4: Supine > Sit: Min A (steadying Pt. > 75%/lift 1 leg);4: Sit > Supine: Min A  (steadying pt. > 75%/lift 1 leg);5: Bed > Chair or W/C: Supervision (verbal cues/safety issues);5: Chair or W/C > Bed: Supervision (verbal cues/safety issues)  FIM - Locomotion: Wheelchair Distance: 150 with extra time and verbal cues for UE propulsion sequence for turning  Locomotion: Wheelchair: 5: Travels 150 ft or more: maneuvers on rugs and over door sills with supervision, cueing or coaxing FIM - Locomotion: Ambulation Ambulation/Gait Assistance: Not tested (comment) Locomotion: Ambulation: 0: Activity did not occur  Comprehension Comprehension Mode: Auditory Comprehension: 5-Understands basic 90% of the time/requires cueing < 10% of the time  Expression Expression Mode: Verbal Expression: 7-Expresses complex ideas: With no assist  Social Interaction Social Interaction: 7-Interacts appropriately with others - No medications needed.  Problem Solving Problem Solving: 3-Solves basic 50 - 74% of the time/requires cueing 25 - 49% of the time  Memory Memory Mode: Not assessed  Memory: 4-Recognizes or recalls 75 - 89% of the time/requires cueing 10 - 24% of the time   Medical Problem List and Plan:  1. Right supracondylar femur fracture status post ORIF 03/01/2012 -2. DVT Prophylaxis/Anticoagulation: Subcutaneous Lovenox. Monitor platelet counts any signs of bleeding  3. Pain Management: Hydrocodone and Robaxin as needed. Monitor with increased mobility.  4. Neuropsych: This patient is capable of making decisions on his/her own behalf.  5. Postoperative anemia. Latest hemoglobin 8.6. Followup CBC. Patient is asymptomatic  6. History right below-knee amputation 2010. Patient does have a prosthesis per [facility]  7. Diabetes mellitus with peripheral neuropathy. uncontrolledHemoglobin A1c 8.0.Levemir 16 Unit BID Units for high am CBG ,. Titrate regimen as needed.  Glucotrol at home dose 5mg .  CBG's generally much improved. One CBG elevated before noon yest, pt doesn't recall  any sweets 8. Hypertension. Clonidine 0.05 mg daily. Normotensive at present   9. Hyperlipidemia. Zocor  LOS (Days) 19 A FACE TO FACE EVALUATION WAS PERFORMED  Janese Radabaugh E 03/23/2012, 9:23 AM

## 2012-03-23 NOTE — Progress Notes (Signed)
1130: Pt discharged to home with granddaughter, Pt transported via her personal wheelchair to the transport van.

## 2012-03-23 NOTE — Progress Notes (Signed)
Occupational Therapy Session Note  Patient Details  Name: Judith [name] MRN: [id number] Date of Birth: 1940-05-14  Today's Date: 03/23/2012 Time: 0930-1030 Time Calculation (min): 60 min  Short Term Goals: Week 3:  OT Short Term Goal 1 (Week 3): Short term goals = Long term goals  Skilled Therapeutic Interventions/Progress Updates:    Pt seen for family education session with pt's granddaughter with emphasis on safety during slide board transfers and sit<>stand for LB dressing tasks. After verbal instruction, written handout and demonstration by clinician, granddaughter completed slide board transfer bed<>WC and WC<>BSC, initially requiring sequential VC and physical assist from clinician to position board and WC, but granddaughter was able to complete final transfer with only questioning cues for safe WC placement. Educated granddaughter on pt's varying ability to participate in sit<>stands/transfers due to fatigue. Granddaughter completed sit<>stand with pt at sink X 3 , requiring VC from clinician to provide tactile/verbal cues to pt for safe hand placement. Educated granddaughter on establishing open communication with pt and providing verbal and tactile cues for pt to facilitate safety during sit<>stands, and also educated on energy conservation/falls prevention techniques using lateral leans for LB dressing when pt is fatigued.   Therapy Documentation Precautions:  Precautions Precautions: Fall Precaution Comments: R BKA Restrictions Weight Bearing Restrictions: Yes RLE Weight Bearing: Non weight bearing Other Position/Activity Restrictions: NWB R LE    Pain: No pain reported this session      See FIM for current functional status  Therapy/Group: Individual Therapy  [name] 03/23/2012, 1:30 PM

## 2012-03-23 NOTE — Progress Notes (Signed)
Social Work Discharge Note Discharge Note  The overall goal for the admission was met for:   Discharge location: Yes-HOME WITH CAP AND FAMILY PROVIDING 24 HR CARE  Length of Stay: Yes-19 DAYS  Discharge activity level: Yes-MIN LEVEL  Home/community participation: Yes  Services provided included: MD, RD, PT, OT, RN, TR, Pharmacy and SW  Financial Services: Medicare and Medicaid  Follow-up services arranged: Home Health: [facility]-PT, RN, DME: [facility]-DROP-ARM BSC, 30 TRANSFER BOARD and Patient/Family request agency [facility]: [facility], DME: [facility]  Comments (or additional information):FAMILY EDUCATION COMPLETED.  FAMILY/ CAP WORKER TO PROVIDE 24 HR CARE  Patient/Family verbalized understanding of follow-up arrangements: Yes  Individual responsible for coordination of the follow-up plan: DENISE-DAUGHTER  Confirmed correct DME delivered: Lucy Chris 03/23/2012    Lucy Chris

## 2012-03-23 NOTE — Progress Notes (Signed)
Reviewed and agree with the attached treatment note.  Daisey Caloca, OTR/L 

## 2012-03-23 NOTE — Discharge Summary (Signed)
[name], [name]                  ACCOUNT NO.:  [id number]  MEDICAL RECORD NO.:  [id number]  LOCATION:  4038                         FACILITY:  [facility]  PHYSICIAN:  Judith [name], M.D.DATE OF BIRTH:  Judith [name]  DATE OF ADMISSION:  03/04/2012 DATE OF DISCHARGE:                              DISCHARGE SUMMARY   DISCHARGE DIAGNOSES: 1. Right supracondylar femur fracture with open reduction and internal     fixation on March 01, 2012. 2. Subcutaneous Lovenox for deep venous thrombosis prophylaxis. 3. Pain management. 4. Postoperative anemia. 5. History of right below-knee amputation in 2010. 6. Diabetes mellitus with peripheral neuropathy. 7. Hypertension. 8. Preadmission Klebsiella urinary tract infection. 9. Hyperlipidemia.  HISTORY OF PRESENT ILLNESS:  This is a 72 year old right-handed female with history of right below-knee amputation in 2010, uses prothesis as well as rolling walker and is a resident of [facility].  Admitted February 29, 2012, after a recent fall at home without loss of consciousness.  She was taken to the emergency room with findings of mildly displaced fracture of the distal femur, placed in a splint maintaining nonweightbearing status and discharged to home.  She presented to the Orthopedic Office for followup x-rays showing displaced right distal femoral fracture.  Underwent ORIF on March 01, 2012, per Dr. Carola Frost.  She remained nonweightbearing x6 weeks.  Subcutaneous Lovenox for DVT prophylaxis.  Postoperative anemia at 9.3 and monitored. Hemoglobin A1c of 8.0 with insulin therapy as directed.  She had been placed on Keflex for preadmission urinary tract infection of Klebsiella on March 02, 2012.  Physical and occupational therapy ongoing.  She is admitted for comprehensive rehab program.  PAST MEDICAL HISTORY:  See discharge diagnoses.  SOCIAL HISTORY:  Lives with son.  One-level apartment.  FUNCTIONAL HISTORY:   Prior to admission, she does not drive.  She is retired.  Uses a prosthesis and walker.  FUNCTIONAL STATUS:  Upon admission to rehab services was +2 total assist for stand pivot transfers.  PHYSICAL EXAMINATION:  VITAL SIGNS:  Blood pressure 137/53, pulse 78, temperature 98.6, respirations 18. GENERAL:  This is an alert female, oriented x3. HEENT:  Pupils round and reactive to light. LUNGS:  Clear to auscultation. CARDIAC:  Rate controlled. ABDOMEN:  Soft, nontender, good bowel sounds. EXTREMITIES:  Right below-knee amputation site was well healed.  REHABILITATION AND HOSPITAL COURSE:  The patient was admitted to inpatient rehab services with therapies initiated on a 3-hour daily basis consisting of physical therapy, occupational therapy, and rehabilitation nursing.  The following issues were addressed during the patient's rehabilitation stay.  Pertained to Judith [name]' right supracondylar femur fracture, she had undergone ORIF on March 01, 2012, by Dr. Carola Frost.  Neurovascular sensation intact.  She was nonweightbearing x6 weeks.  Subcutaneous Lovenox ongoing for DVT prophylaxis.  Pain management with the use of Robaxin and Tylenol.  She had a history of right below-knee amputation in 2010.  Site well healed. She would follow up with [facility].  Diabetes mellitus with peripheral neuropathy.  Hemoglobin A1c of 8.  She will continue on insulin therapy if blood sugars within acceptable range.  Blood pressure is controlled with the use of clonidine.  No orthostatic changes.  She had completed her course of Keflex for Klebsiella urinary tract infection and denied any dysuria or hematuria.  The patient received weekly collaborative interdisciplinary team conferences to discuss estimated length of stay, family teaching, and any barriers to her discharge.  She was upper body dressing supervision, bathing supervision at bed level, max assist sit to stand, lower body dressing  minimal assist, using lateral leans, max assist sit to stand, transfers max assist, and pivot, moderate to min assist with sliding board.  She is minimal assist at sliding board level, supervision for wheelchair mobility, total assist for stand.  Full family teaching was completed with her family.  Plan was to be discharged back to residential facility.  DISCHARGE MEDICATIONS: At the time of dictation included: 1. Vitamin D 1000 units p.o. daily. 2. Clonidine 0.5 mg p.o. daily. 3. Glipizide 5 mg p.o. daily. 4. Levemir 16 units twice daily. 5. Robaxin 500 mg every 6 hours as needed muscle spasms. 6. Multivitamin daily. 7. Protonix 40 mg daily. 8. Senokot-S 2 tablets at bedtime, hold for loose stools. 9. Zocor 40 mg p.o. daily.  DIET: 1800-calorie ADA diet.  SPECIAL INSTRUCTIONS: Nonweightbearing, right lower extremity.  FOLLOWUP: The patient should follow up with Dr. Myrene Galas, Orthopedic Service in 2 weeks, call for appointment, Dr. Fleet Contras on March 31, 2012, Dr. Claudette Laws, outpatient rehab center as needed.  Ongoing therapy were arranged as for rehab services.     Judith [name], P.A.   ______________________________ Judith [name], M.D.    DA/MEDQ  D:  03/22/2012  T:  03/23/2012  Job:  [id number]  cc:   Doralee Albino. Carola Frost, M.D. Fleet Contras, M.D.

## 2012-05-13 ENCOUNTER — Encounter: Payer: Self-pay | Admitting: Obstetrics

## 2012-08-15 ENCOUNTER — Ambulatory Visit: Payer: Medicare Other | Admitting: Obstetrics

## 2012-08-25 ENCOUNTER — Ambulatory Visit (INDEPENDENT_AMBULATORY_CARE_PROVIDER_SITE_OTHER): Payer: Medicare Other | Admitting: Obstetrics

## 2012-08-25 ENCOUNTER — Encounter: Payer: Self-pay | Admitting: Obstetrics

## 2012-08-25 VITALS — BP 148/84 | HR 91 | Temp 98.3°F

## 2012-08-25 DIAGNOSIS — N921 Excessive and frequent menstruation with irregular cycle: Secondary | ICD-10-CM

## 2012-08-25 NOTE — Progress Notes (Signed)
Subjective:     Judith [name] is a 72 y.o. female here for a routine exam.  Current complaints: for postmenopausal bleeding . Per pt's granddaugther states the bleeding has been going for about 4 months. Per pt's granddaughter hemorrhoids having been bothering the patient. Per patient's granddaughter the patient is always having blood in her brief.   Personal health questionnaire reviewed: yes.    Gynecologic History No LMP recorded. Patient is postmenopausal. Contraception: none Last Pap: unsure Last mammogram: unsure  Obstetric History OB History   Grav Para Term Preterm Abortions TAB SAB Ect Mult Living                   The following portions of the patient's history were reviewed and updated as appropriate: allergies, current medications, past family history, past medical history, past social history, past surgical history and problem list.  Review of Systems Pertinent items are noted in HPI.    Objective:    No exam performed today, patient left without being seen.    Assessment:    Healthy female exam.    Plan:    Appt. rescheduled.

## 2012-08-26 NOTE — Progress Notes (Signed)
Patient not seen by Physician.

## 2012-12-19 ENCOUNTER — Encounter (HOSPITAL_COMMUNITY): Payer: Self-pay | Admitting: Emergency Medicine

## 2012-12-19 ENCOUNTER — Emergency Department (HOSPITAL_COMMUNITY)
Admission: EM | Admit: 2012-12-19 | Discharge: 2012-12-19 | Disposition: A | Discharge status: Home / Self Care | Payer: Medicare Other | Attending: Emergency Medicine | Admitting: Emergency Medicine

## 2012-12-19 DIAGNOSIS — Z794 Long term (current) use of insulin: Secondary | ICD-10-CM | POA: Insufficient documentation

## 2012-12-19 DIAGNOSIS — K648 Other hemorrhoids: Secondary | ICD-10-CM

## 2012-12-19 DIAGNOSIS — I10 Essential (primary) hypertension: Secondary | ICD-10-CM | POA: Insufficient documentation

## 2012-12-19 DIAGNOSIS — S88119A Complete traumatic amputation at level between knee and ankle, unspecified lower leg, initial encounter: Secondary | ICD-10-CM | POA: Insufficient documentation

## 2012-12-19 DIAGNOSIS — E785 Hyperlipidemia, unspecified: Secondary | ICD-10-CM | POA: Insufficient documentation

## 2012-12-19 DIAGNOSIS — K644 Residual hemorrhoidal skin tags: Secondary | ICD-10-CM | POA: Insufficient documentation

## 2012-12-19 DIAGNOSIS — Z87891 Personal history of nicotine dependence: Secondary | ICD-10-CM | POA: Insufficient documentation

## 2012-12-19 DIAGNOSIS — Z79899 Other long term (current) drug therapy: Secondary | ICD-10-CM | POA: Insufficient documentation

## 2012-12-19 DIAGNOSIS — R197 Diarrhea, unspecified: Secondary | ICD-10-CM | POA: Insufficient documentation

## 2012-12-19 DIAGNOSIS — E119 Type 2 diabetes mellitus without complications: Secondary | ICD-10-CM | POA: Insufficient documentation

## 2012-12-19 DIAGNOSIS — E669 Obesity, unspecified: Secondary | ICD-10-CM | POA: Insufficient documentation

## 2012-12-19 DIAGNOSIS — Z8781 Personal history of (healed) traumatic fracture: Secondary | ICD-10-CM | POA: Insufficient documentation

## 2012-12-19 HISTORY — DX: Unspecified hemorrhoids: K64.9

## 2012-12-19 MED ORDER — STARCH 51 % RE SUPP: 1.0000 | RECTAL | Status: DC | PRN

## 2012-12-19 MED ORDER — LIDOCAINE HCL 2 % EX GEL: 1.0000 "application " | CUTANEOUS | Status: DC | PRN

## 2012-12-19 MED ORDER — LIDOCAINE HCL 2 % EX GEL
Freq: Once | CUTANEOUS | Status: AC
  Administered 2012-12-19: 20
  Filled 2012-12-19: qty 20

## 2012-12-19 NOTE — ED Provider Notes (Signed)
CSN: [id number]     Arrival date & time 12/19/12  2009 History  This chart was scribed for Judith Dredge, PA-C, working with Shon Baton, MD by Blanchard Kelch, ED Scribe. This patient was seen in room TR08C/TR08C and the patient's care was started at 10:19 PM.    Chief Complaint  Patient presents with  . Hemorrhoids    The history is provided by the patient and a relative. No language interpreter was used.    HPI Comments: Judith [name] is a 72 y.o. female with a history of hemorrhoids who presents to the Emergency Department complaining of intermittent external rectal pain that began about a month ago due to a flare up of hemorroids. Her family reports she has had loose stools due to stool softener she has taken for the hemorrhoids, last BM was yesterday. She was seen by her PCP and was given prescription strength Preparation H. He was unable to examine her because she is an amputee and they had no hospital beds available so she was advised to come to the ED. She has been using a stool softener and the cream with moderate relief. She denies fever, abdominal pain, or blood in stool or toilet tissue, dysuria, hematuria, or frequency. States that this pain is exactly like her chronic hemorrhoid pain.   Her PCP is Dr. Concepcion Elk.   Past Medical History  Diagnosis Date  . Hypertension   . Insulin dependent diabetes mellitus 03/01/2012  . 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 Closed right ankle fracture   . Hyperlipidemia   . Fracture of femur, distal, right, closed 03/01/2012  . Obesity (BMI 30-39.9) 03/03/2012  . Hemorrhoid    Past Surgical History  Procedure Laterality Date  . Below knee leg amputation      Right  . Orif femur fracture  03/01/2012    Procedure: OPEN REDUCTION INTERNAL FIXATION (ORIF) DISTAL FEMUR FRACTURE;  Surgeon: Budd Palmer, MD;  Location: [facility] OR;  Service: Orthopedics;  Laterality: Right;  ORIF right femur  . Orif femur fracture  03/03/2012    Dr Carola Frost   right  leg   History reviewed. No pertinent family history. History  Substance Use Topics  . Smoking status: Former Games developer  . Smokeless tobacco: Never Used  . Alcohol Use: No   OB History   Grav Para Term Preterm Abortions TAB SAB Ect Mult Living                 Review of Systems  Constitutional: Negative for fever.  Gastrointestinal: Positive for diarrhea and rectal pain. Negative for abdominal pain, blood in stool and anal bleeding.  Genitourinary: Negative for dysuria, frequency and hematuria.    Allergies  Review of patient's allergies indicates no known allergies.  Home Medications   Current Outpatient Rx  Name  Route  Sig  Dispense  Refill  . cloNIDine (CATAPRES) 0.1 MG tablet   Oral   Take 0.05 mg by mouth 2 (two) times daily.         Marland Kitchen glipiZIDE (GLUCOTROL XL) 5 MG 24 hr tablet   Oral   Take 1 tablet (5 mg total) by mouth daily with breakfast.   30 tablet   1   . insulin detemir (LEVEMIR) 100 UNIT/ML injection   Subcutaneous   Inject 20 Units into the skin daily.         . Multiple Vitamin (MULTIVITAMIN WITH MINERALS) TABS   Oral   Take 1 tablet  by mouth daily.         . simvastatin (ZOCOR) 40 MG tablet   Oral   Take 1 tablet (40 mg total) by mouth every evening.   30 tablet   1    Triage Vitals: BP 101/58  Pulse 90  Temp(Src) 98.2 F (36.8 C) (Oral)  Resp 18  SpO2 100%  Physical Exam  Nursing note and vitals reviewed. Constitutional: She is oriented to person, place, and time. She appears well-developed and well-nourished. No distress.  HENT:  Head: Normocephalic and atraumatic.  Neck: Neck supple.  Pulmonary/Chest: Effort normal.  Abdominal: Soft. There is no tenderness. There is no rebound and no guarding.  Genitourinary:  Chaperone present for rectal exam, notable for some soft internal hemorrhoids, of which palpation reporoduces pain. Two soft nontender external hemorrhoids. No gross blood. Anal tone normal. No stool impaction.    Neurological: She is alert and oriented to person, place, and time.  Skin: Skin is warm and dry. She is not diaphoretic.    ED Course  Procedures (including critical care time)  DIAGNOSTIC STUDIES: Oxygen Saturation is 100% on room air, normal by my interpretation.    COORDINATION OF CARE: 10:19 PM -Will prepare pt for rectal exam. Patient verbalizes understanding and agrees with treatment plan. 11:14 PM -Dr. Wilkie Aye saw and spoke to patient regarding pain. Patient verbalizes understanding and agrees with treatment plan.  Dr Wilkie Aye recommends lidocaine jelly PR with same for home.     Labs Review Labs Reviewed - No data to display Imaging Review No results found.  EKG Interpretation   None       MDM   1. Internal hemorrhoids    Pt with tender internal hemorrhoids that are not thrombosed.  Palpation of hemorrhoids reproduces pain.  She has no abdominal tenderness and is afebrile, nontoxic.  This pain has been ongoing for weeks-months, gradually worsening.  The internal hemorrhoids are soft, and very clearly hemorrhoids on exam.  I doubt rectal abscess.    Pt d/c home with anusol and lidocaine jelly, PCP follow up and surgical referral for worsening symptoms.  Discussed findings, treatment, and follow up  with patient.  Pt given return precautions.  Pt verbalizes understanding and agrees with plan.      I doubt any other EMC precluding discharge at this time including, but not necessarily limited to the following: rectal abscess, bacterial infection, proctitis/colitis, thrombosed hemorrhoids.   I personally performed the services described in this documentation, which was scribed in my presence. The recorded information has been reviewed and is accurate.    Judith Dredge, PA-C 12/19/12 [id number]

## 2012-12-19 NOTE — ED Notes (Signed)
Patient presents with c/o hemorrhoid pain.   Denies any bleeding, no diarrhea or constipation   States it just hurts.

## 2012-12-19 NOTE — ED Notes (Addendum)
Pts family states that pt has been having problems with her hemorrhoids - reports pain and swelling. Also state that pt was given a stool softener and has been having diarrhea for 4 days. They state that the last BM this afternoon was soft and not runny. Pts family states that she has cream for her hemorrhoids and has been using it.

## 2012-12-20 NOTE — ED Provider Notes (Signed)
Medical screening examination/treatment/procedure(s) were conducted as a shared visit with non-physician practitioner(s) and myself.  I personally evaluated the patient during the encounter.  EKG Interpretation   None       This is a 72 year old female with a history of hemorrhoids she reports with rectal pain. She's had no bleeding. She does have suppositories at home. She was advised to the ER because she was unable to be evaluated by her home health physician. She is nontoxic-appearing and her vital signs are reassuring. Patient has evidence of internal hemorrhoids. Patient is to continue her regimen. Was offered viscous lidocaine when necessary.  Patient is unable to tolerate sitz baths.  After history, exam, and medical workup I feel the patient has been appropriately medically screened and is safe for discharge home. Pertinent diagnoses were discussed with the patient. Patient was given return precautions.  Shon Baton, MD 12/20/12 [phone]

## 2013-02-18 ENCOUNTER — Inpatient Hospital Stay (HOSPITAL_COMMUNITY)
Admission: EM | Admit: 2013-02-18 | Discharge: 2013-02-20 | DRG: 689 | Disposition: A | Discharge status: Home Health | Payer: Medicare Other | Attending: Internal Medicine | Admitting: Internal Medicine

## 2013-02-18 ENCOUNTER — Encounter (HOSPITAL_COMMUNITY): Payer: Self-pay | Admitting: Emergency Medicine

## 2013-02-18 ENCOUNTER — Emergency Department (HOSPITAL_COMMUNITY): Payer: Medicare Other

## 2013-02-18 DIAGNOSIS — F0390 Unspecified dementia without behavioral disturbance: Secondary | ICD-10-CM | POA: Diagnosis present

## 2013-02-18 DIAGNOSIS — F05 Delirium due to known physiological condition: Secondary | ICD-10-CM | POA: Diagnosis present

## 2013-02-18 DIAGNOSIS — Z87440 Personal history of urinary (tract) infections: Secondary | ICD-10-CM

## 2013-02-18 DIAGNOSIS — I10 Essential (primary) hypertension: Secondary | ICD-10-CM | POA: Diagnosis present

## 2013-02-18 DIAGNOSIS — S72401A Unspecified fracture of lower end of right femur, initial encounter for closed fracture: Secondary | ICD-10-CM

## 2013-02-18 DIAGNOSIS — E669 Obesity, unspecified: Secondary | ICD-10-CM

## 2013-02-18 DIAGNOSIS — S88119A Complete traumatic amputation at level between knee and ankle, unspecified lower leg, initial encounter: Secondary | ICD-10-CM

## 2013-02-18 DIAGNOSIS — E639 Nutritional deficiency, unspecified: Secondary | ICD-10-CM

## 2013-02-18 DIAGNOSIS — Z87891 Personal history of nicotine dependence: Secondary | ICD-10-CM

## 2013-02-18 DIAGNOSIS — N390 Urinary tract infection, site not specified: Principal | ICD-10-CM | POA: Diagnosis present

## 2013-02-18 DIAGNOSIS — D62 Acute posthemorrhagic anemia: Secondary | ICD-10-CM

## 2013-02-18 DIAGNOSIS — IMO0001 Reserved for inherently not codable concepts without codable children: Secondary | ICD-10-CM

## 2013-02-18 DIAGNOSIS — D649 Anemia, unspecified: Secondary | ICD-10-CM | POA: Diagnosis present

## 2013-02-18 DIAGNOSIS — E119 Type 2 diabetes mellitus without complications: Secondary | ICD-10-CM | POA: Diagnosis present

## 2013-02-18 DIAGNOSIS — Z794 Long term (current) use of insulin: Secondary | ICD-10-CM

## 2013-02-18 DIAGNOSIS — E785 Hyperlipidemia, unspecified: Secondary | ICD-10-CM | POA: Diagnosis present

## 2013-02-18 DIAGNOSIS — Z89511 Acquired absence of right leg below knee: Secondary | ICD-10-CM

## 2013-02-18 DIAGNOSIS — G9340 Encephalopathy, unspecified: Secondary | ICD-10-CM | POA: Diagnosis present

## 2013-02-18 DIAGNOSIS — W19XXXA Unspecified fall, initial encounter: Secondary | ICD-10-CM

## 2013-02-18 LAB — CBC
HCT: 37.6 % (ref 36.0–46.0)
HEMOGLOBIN: 12.5 g/dL (ref 12.0–15.0)
MCH: 26.9 pg (ref 26.0–34.0)
MCHC: 33.2 g/dL (ref 30.0–36.0)
MCV: 81 fL (ref 78.0–100.0)
Platelets: 288 10*3/uL (ref 150–400)
RBC: 4.64 MIL/uL (ref 3.87–5.11)
RDW: 15 % (ref 11.5–15.5)
WBC: 6.9 10*3/uL (ref 4.0–10.5)

## 2013-02-18 LAB — COMPREHENSIVE METABOLIC PANEL
ALT: 10 U/L (ref 0–35)
AST: 12 U/L (ref 0–37)
Albumin: 3.2 g/dL — ABNORMAL LOW (ref 3.5–5.2)
Alkaline Phosphatase: 87 U/L (ref 39–117)
BUN: 14 mg/dL (ref 6–23)
CALCIUM: 9.5 mg/dL (ref 8.4–10.5)
CO2: 25 mEq/L (ref 19–32)
Chloride: 101 mEq/L (ref 96–112)
Creatinine, Ser: 0.7 mg/dL (ref 0.50–1.10)
GFR calc Af Amer: >90 mL/min (ref >90)
GFR calc non Af Amer: 84 mL/min — ABNORMAL LOW (ref >90)
Glucose, Bld: 287 mg/dL — ABNORMAL HIGH (ref 70–99)
POTASSIUM: 3.8 meq/L (ref 3.7–5.3)
SODIUM: 141 meq/L (ref 137–147)
TOTAL PROTEIN: 7.6 g/dL (ref 6.0–8.3)
Total Bilirubin: 0.3 mg/dL (ref 0.3–1.2)

## 2013-02-18 LAB — POCT I-STAT, CHEM 8
BUN: 13 mg/dL (ref 6–23)
CHLORIDE: 102 meq/L (ref 96–112)
CREATININE: 0.8 mg/dL (ref 0.50–1.10)
Calcium, Ion: 1.17 mmol/L (ref 1.13–1.30)
GLUCOSE: 291 mg/dL — AB (ref 70–99)
HCT: 38 % (ref 36.0–46.0)
Hemoglobin: 12.9 g/dL (ref 12.0–15.0)
POTASSIUM: 3.6 meq/L — AB (ref 3.7–5.3)
Sodium: 142 mEq/L (ref 137–147)
TCO2: 26 mmol/L (ref 0–100)

## 2013-02-18 LAB — URINE MICROSCOPIC-ADD ON

## 2013-02-18 LAB — GLUCOSE, CAPILLARY
GLUCOSE-CAPILLARY: 101 mg/dL — AB (ref 70–99)
GLUCOSE-CAPILLARY: 184 mg/dL — AB (ref 70–99)
GLUCOSE-CAPILLARY: 77 mg/dL (ref 70–99)
Glucose-Capillary: 247 mg/dL — ABNORMAL HIGH (ref 70–99)
Glucose-Capillary: 218 mg/dL — ABNORMAL HIGH (ref 70–99)

## 2013-02-18 LAB — URINALYSIS, ROUTINE W REFLEX MICROSCOPIC
Bilirubin Urine: NEGATIVE
GLUCOSE, UA: 100 mg/dL — AB
Ketones, ur: NEGATIVE mg/dL
Nitrite: NEGATIVE
PROTEIN: NEGATIVE mg/dL
SPECIFIC GRAVITY, URINE: 1.014 (ref 1.005–1.030)
UROBILINOGEN UA: 0.2 mg/dL (ref 0.0–1.0)
pH: 7 (ref 5.0–8.0)

## 2013-02-18 LAB — CG4 I-STAT (LACTIC ACID): Lactic Acid, Venous: 1.47 mmol/L (ref 0.5–2.2)

## 2013-02-18 MED ORDER — HEPARIN SODIUM (PORCINE) 5000 UNIT/ML IJ SOLN
5000.0000 [IU] | Freq: Three times a day (TID) | INTRAMUSCULAR | Status: DC
  Administered 2013-02-18 – 2013-02-20 (×7): 5000 [IU] via SUBCUTANEOUS
  Filled 2013-02-18 (×11): qty 1

## 2013-02-18 MED ORDER — INSULIN DETEMIR 100 UNIT/ML ~~LOC~~ SOLN: 10.0000 [IU] | Freq: Every day | SUBCUTANEOUS | Status: DC

## 2013-02-18 MED ORDER — INSULIN DETEMIR 100 UNIT/ML ~~LOC~~ SOLN
10.0000 [IU] | Freq: Every day | SUBCUTANEOUS | Status: DC
  Administered 2013-02-18 – 2013-02-20 (×3): 10 [IU] via SUBCUTANEOUS
  Filled 2013-02-18 (×3): qty 0.1

## 2013-02-18 MED ORDER — STARCH 51 % RE SUPP: 1.0000 | RECTAL | Status: DC | PRN

## 2013-02-18 MED ORDER — CLONIDINE HCL 0.1 MG PO TABS
0.0500 mg | ORAL_TABLET | Freq: Three times a day (TID) | ORAL | Status: DC
  Administered 2013-02-18 – 2013-02-20 (×7): 0.05 mg via ORAL
  Filled 2013-02-18 (×9): qty 0.5

## 2013-02-18 MED ORDER — HYDROCORTISONE ACETATE 25 MG RE SUPP: 25.0000 mg | Freq: Two times a day (BID) | RECTAL | Status: DC | PRN

## 2013-02-18 MED ORDER — POTASSIUM CHLORIDE CRYS ER 20 MEQ PO TBCR
40.0000 meq | EXTENDED_RELEASE_TABLET | Freq: Once | ORAL | Status: AC
  Administered 2013-02-18: 40 meq via ORAL
  Filled 2013-02-18: qty 2

## 2013-02-18 MED ORDER — INSULIN ASPART 100 UNIT/ML ~~LOC~~ SOLN
0.0000 [IU] | Freq: Three times a day (TID) | SUBCUTANEOUS | Status: DC
Start: 2013-02-18 — End: 2013-02-20
  Administered 2013-02-18: 3 [IU] via SUBCUTANEOUS
  Administered 2013-02-18 – 2013-02-19 (×2): 5 [IU] via SUBCUTANEOUS
  Administered 2013-02-19 – 2013-02-20 (×2): 3 [IU] via SUBCUTANEOUS
  Administered 2013-02-20: 2 [IU] via SUBCUTANEOUS
  Administered 2013-02-20: 3 [IU] via SUBCUTANEOUS

## 2013-02-18 MED ORDER — DEXTROSE 5 % IV SOLN
1.0000 g | Freq: Once | INTRAVENOUS | Status: AC
  Administered 2013-02-18: 1 g via INTRAVENOUS
  Filled 2013-02-18: qty 10

## 2013-02-18 MED ORDER — HYDRALAZINE HCL 20 MG/ML IJ SOLN
10.0000 mg | Freq: Once | INTRAMUSCULAR | Status: AC
  Administered 2013-02-18: 10 mg via INTRAVENOUS
  Filled 2013-02-18: qty 1

## 2013-02-18 MED ORDER — SIMVASTATIN 40 MG PO TABS
40.0000 mg | ORAL_TABLET | Freq: Every evening | ORAL | Status: DC
  Administered 2013-02-18 – 2013-02-20 (×3): 40 mg via ORAL
  Filled 2013-02-18 (×3): qty 1

## 2013-02-18 MED ORDER — LIDOCAINE HCL 2 % EX GEL
Freq: Once | CUTANEOUS | Status: AC
  Administered 2013-02-18: 20 via TOPICAL
  Filled 2013-02-18: qty 20

## 2013-02-18 MED ORDER — LIDOCAINE HCL 2 % EX GEL: 1.0000 "application " | CUTANEOUS | Status: DC | PRN

## 2013-02-18 MED ORDER — DEXTROSE 5 % IV SOLN
1.0000 g | INTRAVENOUS | Status: DC
  Administered 2013-02-19 – 2013-02-20 (×2): 1 g via INTRAVENOUS
  Filled 2013-02-18 (×2): qty 10

## 2013-02-18 MED ORDER — SODIUM CHLORIDE 0.9 % IV SOLN
INTRAVENOUS | Status: DC
  Administered 2013-02-18 (×2): via INTRAVENOUS

## 2013-02-18 MED ORDER — CLONIDINE HCL 0.1 MG PO TABS
0.0500 mg | ORAL_TABLET | Freq: Two times a day (BID) | ORAL | Status: DC
  Administered 2013-02-18: 0.05 mg via ORAL
  Filled 2013-02-18 (×2): qty 0.5

## 2013-02-18 MED ORDER — SODIUM CHLORIDE 0.9 % IV SOLN
INTRAVENOUS | Status: DC
  Administered 2013-02-18 – 2013-02-19 (×2): via INTRAVENOUS

## 2013-02-18 NOTE — Progress Notes (Signed)
Pt has an elevated Bp of 189/13mmHg, floor cover made aware. Awaiting for orders. Will continue to monitor.

## 2013-02-18 NOTE — Evaluation (Signed)
Physical Therapy Evaluation Patient Details Name: Judith [name] MRN: [id number] DOB: [birth year] Today's Date: 02/18/2013 Time: [phone] PT Time Calculation (min): 20 min  PT Assessment / Plan / Recommendation History of Present Illness  73 y.o. female admitted to [facility] on 02/18/13 with AMS.  UTI suspected.  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  Per family she had stopped therapy due to fear of falling.    Clinical Impression  Per family her mental status is getting closer to her baseline.  She has not done much walking due to fear of falling with her prosthesis in recent months.  Family would like to try home therapy again to try to get her back on her feet with her prosthetic right leg.  PT will follow acutely to practice mobility and slide board transfers to ensure safety with this to d/c home and recommend HHPT f/u to work on gait training.   PT to follow acutely for deficits listed below.       PT Assessment  Patient needs continued PT services    Follow Up Recommendations  Home health PT;Supervision/Assistance - 24 hour    Does the patient have the potential to tolerate intense rehabilitation     NA  Barriers to Discharge   None      Equipment Recommendations  None recommended by PT    Recommendations for Other Services   None  Frequency Min 3X/week    Precautions / Restrictions Precautions Precautions: Fall Precaution Comments: R BKA, has prosthesis, but not here at hospital, also has orthotic shoe which she does have here in hospital for left foot.    Pertinent Vitals/Pain See vitals flow sheet.      Mobility  Bed Mobility Overal bed mobility: Needs Assistance Bed Mobility: Rolling;Supine to Sit;Sit to Supine Rolling: Min assist Supine to sit: Min assist Sit to supine: Min assist General bed mobility comments: min assist to support trunk  for all bed mobility and supine to sit transitions.         PT Diagnosis: Difficulty walking;Abnormality of gait;Generalized weakness;Altered mental status  PT Problem List: Decreased strength;Decreased activity tolerance;Decreased mobility;Decreased balance;Decreased cognition;Decreased knowledge of use of DME;Obesity PT Treatment Interventions: DME instruction;Functional mobility training;Therapeutic activities;Therapeutic exercise;Balance training;Neuromuscular re-education;Cognitive remediation;Patient/family education;Wheelchair mobility training     PT Goals(Current goals can be found in the care plan section) Acute Rehab PT Goals Patient Stated Goal: none stated, family wants her back home and to try PT at home again PT Goal Formulation: With patient/family Time For Goal Achievement: 03/04/13 Potential to Achieve Goals: Good  Visit Information  Last PT Received On: 02/18/13 Assistance Needed: +1 History of Present Illness: 73 y.o. female admitted to [facility] on 02/18/13 with AMS.  UTI suspected.  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  Per family she had stopped therapy due to fear of falling.         Prior [place] expects to be discharged to:: Private residence Living Arrangements: Other relatives (granddaughter) Available Help at Discharge: Family;Available 24 hours/day Type of Home: House Home Access: Ramped entrance Home Layout: One level Home Equipment: Walker - 2 wheels;Bedside commode;Wheelchair - Education administrator (comment) ([employer]) Additional Comments: Per family they have all  equipment needed.   Prior Function Level of Independence: Needs assistance Gait / Transfers Assistance Needed: pt can do most slide board transfers with supervision for safety ADL's / Homemaking Assistance Needed: Total assist needed for  housekeeping.  ADLs not assessed Communication / Swallowing Assistance Needed: no difficulties Communication Communication: No difficulties    Cognition  Cognition Arousal/Alertness: Awake/alert Behavior During Therapy: WFL for tasks assessed/performed Overall Cognitive Status: History of cognitive impairments - at baseline (family reports she is much closer to baseline this PM)    Extremity/Trunk Assessment Upper Extremity Assessment Upper Extremity Assessment: Overall WFL for tasks assessed Lower Extremity Assessment Lower Extremity Assessment: RLE deficits/detail;LLE deficits/detail RLE Deficits / Details: right leg weak due to h/o BKA and ORIF to right femur.  Pt has a prosthetic leg, but is fearful of falling, so has not used it in a while per family report.   LLE Deficits / Details: Left leg although strong to MMT 4/5, is unable to support her weight (obese) over one limb to stand.   Cervical / Trunk Assessment Cervical / Trunk Assessment: Normal   Balance Balance Overall balance assessment: Needs assistance Sitting-balance support: No upper extremity supported;Feet supported Sitting balance-Leahy Scale: Good  End of Session PT - End of Session Activity Tolerance: Patient tolerated treatment well Patient left: in bed;with call bell/phone within reach;with bed alarm set;with family/visitor present    Wells Guiles B. [place], [place], DPT [phone]   02/18/2013, 4:30 PM

## 2013-02-18 NOTE — H&P (Signed)
[facility] History and Physical  Judith [name] RSW:[id number] DOB: 05/09/1940 DOA: 02/18/2013  Referring physician: EDP PCP: Philis Fendt, MD   Chief Complaint: AMS   HPI: Judith [name] is a 73 y.o. female who presents to the ED with confusion.  Symptoms onset tonight around midnight.  Per daughter she was talking about her deceased son and "nonsense".  History of same with UTI in past.  In the ED she is found to have a UTI.  Review of Systems: Systems reviewed.  As above, otherwise negative  Past Medical History  Diagnosis Date  . Hypertension   . Insulin dependent diabetes mellitus 03/01/2012  . 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 Closed right ankle fracture   . Hyperlipidemia   . Fracture of femur, distal, right, closed 03/01/2012  . Obesity (BMI 30-39.9) 03/03/2012  . Hemorrhoid    Past Surgical History  Procedure Laterality Date  . Below knee leg amputation      Right  . Orif femur fracture  03/01/2012    Procedure: OPEN REDUCTION INTERNAL FIXATION (ORIF) DISTAL FEMUR FRACTURE;  Surgeon: Rozanna Box, MD;  Location: [facility];  Service: Orthopedics;  Laterality: Right;  ORIF right femur  . Orif femur fracture  03/03/2012    Dr Marcelino Scot   right leg   Social History:  reports that she has quit smoking. She has never used smokeless tobacco. She reports that she does not drink alcohol or use illicit drugs.  No Known Allergies  No family history on file.   Prior to Admission medications   Medication Sig Start Date End Date Taking? Authorizing Provider  cloNIDine (CATAPRES) 0.1 MG tablet Take 0.05 mg by mouth 2 (two) times daily.   Yes Historical Provider, MD  glipiZIDE (GLUCOTROL XL) 5 MG 24 hr tablet Take 1 tablet (5 mg total) by mouth daily with breakfast. 03/23/12  Yes Lavon Paganini Angiulli, PA-C  insulin detemir (LEVEMIR) 100 UNIT/ML injection Inject 20 Units into the skin daily.   Yes Historical Provider, MD  Multiple Vitamin (MULTIVITAMIN WITH MINERALS) TABS Take 1  tablet by mouth daily. 03/23/12  Yes Daniel J Angiulli, PA-C  simvastatin (ZOCOR) 40 MG tablet Take 1 tablet (40 mg total) by mouth every evening. 03/23/12  Yes Daniel J Angiulli, PA-C  lidocaine (XYLOCAINE JELLY) 2 % jelly 1 application by Other route as needed. Inject tube in rectum PRN pain 12/19/12   Clayton Bibles, PA-C  starch (ANUSOL) 51 % suppository Place 1 suppository rectally as needed for pain. 12/19/12   Clayton Bibles, PA-C   Physical Exam: Filed Vitals:   02/18/13 0400  BP: 158/69  Pulse: 91  Temp:   Resp: 18    BP 158/69  Pulse 91  Temp(Src) 99.2 F (37.3 C)  Resp 18  SpO2 100%  General Appearance:    Alert, pleasantly confused, not oriented, no distress, appears stated age  Head:    Normocephalic, atraumatic  Eyes:    PERRL, EOMI, sclera non-icteric        Nose:   Nares without drainage or epistaxis. Mucosa, turbinates normal  Throat:   Moist mucous membranes. Oropharynx without erythema or exudate.  Neck:   Supple. No carotid bruits.  No thyromegaly.  No lymphadenopathy.   Back:     No CVA tenderness, no spinal tenderness  Lungs:     Clear to auscultation bilaterally, without wheezes, rhonchi or rales  Chest wall:    No tenderness to palpitation  Heart:  Regular rate and rhythm without murmurs, gallops, rubs  Abdomen:     Soft, non-tender, nondistended, normal bowel sounds, no organomegaly  Genitalia:    deferred  Rectal:    deferred  Extremities:   No clubbing, cyanosis or edema.  Pulses:   2+ and symmetric all extremities  Skin:   Skin color, texture, turgor normal, no rashes or lesions  Lymph nodes:   Cervical, supraclavicular, and axillary nodes normal  Neurologic:   CNII-XII intact. Normal strength, sensation and reflexes      throughout    Labs on Admission:  Basic Metabolic Panel:  Recent Labs Lab 02/18/13 0300 02/18/13 0319  NA 141 142  K 3.8 3.6*  CL 101 102  CO2 25  --   GLUCOSE 287* 291*  BUN 14 13  CREATININE 0.70 0.80  CALCIUM 9.5  --     Liver Function Tests:  Recent Labs Lab 02/18/13 0300  AST 12  ALT 10  ALKPHOS 87  BILITOT 0.3  PROT 7.6  ALBUMIN 3.2*   No results found for this basename: LIPASE, AMYLASE,  in the last 168 hours No results found for this basename: AMMONIA,  in the last 168 hours CBC:  Recent Labs Lab 02/18/13 0300 02/18/13 0319  WBC 6.9  --   HGB 12.5 12.9  HCT 37.6 38.0  MCV 81.0  --   PLT 288  --    Cardiac Enzymes: No results found for this basename: CKTOTAL, CKMB, CKMBINDEX, TROPONINI,  in the last 168 hours  BNP (last 3 results) No results found for this basename: PROBNP,  in the last 8760 hours CBG:  Recent Labs Lab 02/18/13 0306  GLUCAP 247*    Radiological Exams on Admission: Dg Chest Portable 1 View  02/18/2013   CLINICAL DATA:  Altered mental status.  Confusion.  EXAM: PORTABLE CHEST - 1 VIEW  COMPARISON:  Chest radiograph performed 02/29/2012  FINDINGS: The lungs are mildly hypoexpanded but appear grossly clear. There is no evidence of focal opacification, pleural effusion or pneumothorax.  The cardiomediastinal silhouette is within normal limits. No acute osseous abnormalities are seen.  IMPRESSION: Lungs mildly hypoexpanded but grossly clear.   Electronically Signed   By: Garald Balding M.D.   On: 02/18/2013 02:56    EKG: Independently reviewed.  Assessment/Plan Principal Problem:   UTI (lower urinary tract infection) Active Problems:   Insulin dependent diabetes mellitus   Delirium due to another medical condition   1. AMS - delirium due to UTI most likely, treat UTI, further work up if delirium does not resolve with treatment. 2. UTI - treating with rocephin 3. DM2 - holding home glipizide and lantus 20, putting patient on lantus 10 and med dose SSI ac/hs while here in case she has decreased PO intake with her illness.    Code Status: Full Code  Family Communication: Granddaughter at bedside Disposition Plan: Admit to inpatient   Time spent: 50  min  GARDNER, JARED M. [facility] Pager [phone]  If 7AM-7PM, please contact the day team taking care of the patient [email or web address] Password [facility] 02/18/2013, 4:29 AM

## 2013-02-18 NOTE — Progress Notes (Signed)
PATIENT DETAILS Name: Judith [name] Age: 73 y.o. Sex: female Date of Birth: 1940-02-04 Admit Date: 02/18/2013 Admitting Physician Etta Quill, DO WNU:[id number] A, MD  Subjective: Seems to be awake and alert, claims she is from [place]. Seems to be mildly forgetful, but answers a lot of questions appropriately. Spoke with patient's son, current mental status is not at baseline  Assessment/Plan: Principal Problem: Acute encephalopathy - Suspected secondary to UTI - Spoke with son Judith [name], looks like patient has some amount of dementia at baseline. - Continue to treat UTI, if not better in the next 24-48 hours, then commence further workup. However currently, speech is clear, patient is nonfocal.    UTI (lower urinary tract infection) - Continue treatment with Rocephin-day 1 - Await urine cultures    Insulin dependent diabetes mellitus - Continue Levemir and sliding scale - Will follow CBGs and adjust accordingly - Resume glipizide on discharge   Hypertension - Continue with clonidine-but since BP uncontrolled, will change to 3 times a day - Follow BP closely, and adjust/add medications accordingly  Dyslipidemia - Continue statins  History of right below the knee amputation in 2010  Disposition: Remain inpatient  DVT Prophylaxis: Prophylactic Heparin   Code Status: Full code  Family Communication Son- Judith [name] over the phone  Procedures:  None  CONSULTS:  None  Time spent 40 minutes-which includes 50% of the time with face-to-face with patient/ family and coordinating care related to the above assessment and plan.    MEDICATIONS: Scheduled Meds: . [START ON 02/19/2013] cefTRIAXone (ROCEPHIN)  IV  1 g Intravenous Q24H  . cloNIDine  0.05 mg Oral BID  . heparin  5,000 Units Subcutaneous Q8H  . insulin aspart  0-15 Units Subcutaneous TID WC  . insulin detemir  10 Units Subcutaneous Daily  . simvastatin  40 mg Oral QPM    Continuous Infusions: . sodium chloride 75 mL/hr at 02/18/13 0604   PRN Meds:.hydrocortisone, lidocaine  Antibiotics: Anti-infectives   Start     Dose/Rate Route Frequency Ordered Stop   02/19/13 0415  cefTRIAXone (ROCEPHIN) 1 g in dextrose 5 % 50 mL IVPB     1 g 100 mL/hr over 30 Minutes Intravenous Every 24 hours 02/18/13 0425     02/18/13 0415  cefTRIAXone (ROCEPHIN) 1 g in dextrose 5 % 50 mL IVPB     1 g 100 mL/hr over 30 Minutes Intravenous  Once 02/18/13 0401 02/18/13 0456       PHYSICAL EXAM: Vital signs in last 24 hours: Filed Vitals:   02/18/13 0400 02/18/13 0500 02/18/13 0555 02/18/13 0625  BP: 158/69  185/113 189/98  Pulse: 91 87 100   Temp:   98 F (36.7 C)   TempSrc:   Oral   Resp: 18 26 20    Height:   5\' 3"  (1.6 m)   Weight:   85.14 kg (187 lb 11.2 oz)   SpO2: 100% 100% 97%     Weight change:  Filed Weights   02/18/13 0555  Weight: 85.14 kg (187 lb 11.2 oz)   Body mass index is 33.26 kg/(m^2).   Gen Exam: Awake and pleasantly confused with clear speech.   Neck: Supple, No JVD.   Chest: B/L Clear.   CVS: S1 S2 Regular, no murmurs.  Abdomen: soft, BS +, non tender, non distended.  Extremities: no edema, status post right BKA Neurologic: Non Focal.   Skin: No Rash.   Wounds: N/A.    Intake/Output from previous day:  Intake/Output Summary (Last 24 hours) at 02/18/13 1033 Last data filed at 02/18/13 0933  Gross per 24 hour  Intake    240 ml  Output    501 ml  Net   -261 ml     LAB RESULTS: CBC  Recent Labs Lab 02/18/13 0300 02/18/13 0319  WBC 6.9  --   HGB 12.5 12.9  HCT 37.6 38.0  PLT 288  --   MCV 81.0  --   MCH 26.9  --   MCHC 33.2  --   RDW 15.0  --     Chemistries   Recent Labs Lab 02/18/13 0300 02/18/13 0319  NA 141 142  K 3.8 3.6*  CL 101 102  CO2 25  --   GLUCOSE 287* 291*  BUN 14 13  CREATININE 0.70 0.80  CALCIUM 9.5  --     CBG:  Recent Labs Lab 02/18/13 0306 02/18/13 0739  GLUCAP 247* 218*     GFR Estimated Creatinine Clearance: 64.8 ml/min (by C-G formula based on Cr of 0.8).  Coagulation profile No results found for this basename: INR, PROTIME,  in the last 168 hours  Cardiac Enzymes No results found for this basename: CK, CKMB, TROPONINI, MYOGLOBIN,  in the last 168 hours  No components found with this basename: POCBNP,  No results found for this basename: DDIMER,  in the last 72 hours No results found for this basename: HGBA1C,  in the last 72 hours No results found for this basename: CHOL, HDL, LDLCALC, TRIG, CHOLHDL, LDLDIRECT,  in the last 72 hours No results found for this basename: TSH, T4TOTAL, FREET3, T3FREE, THYROIDAB,  in the last 72 hours No results found for this basename: VITAMINB12, FOLATE, FERRITIN, TIBC, IRON, RETICCTPCT,  in the last 72 hours No results found for this basename: LIPASE, AMYLASE,  in the last 72 hours  Urine Studies No results found for this basename: UACOL, UAPR, USPG, UPH, UTP, UGL, UKET, UBIL, UHGB, UNIT, UROB, ULEU, UEPI, UWBC, URBC, UBAC, CAST, CRYS, UCOM, BILUA,  in the last 72 hours  MICROBIOLOGY: No results found for this or any previous visit (from the past 240 hour(s)).  RADIOLOGY STUDIES/RESULTS: Dg Chest Portable 1 View  02/18/2013   CLINICAL DATA:  Altered mental status.  Confusion.  EXAM: PORTABLE CHEST - 1 VIEW  COMPARISON:  Chest radiograph performed 02/29/2012  FINDINGS: The lungs are mildly hypoexpanded but appear grossly clear. There is no evidence of focal opacification, pleural effusion or pneumothorax.  The cardiomediastinal silhouette is within normal limits. No acute osseous abnormalities are seen.  IMPRESSION: Lungs mildly hypoexpanded but grossly clear.   Electronically Signed   By: Garald Balding M.D.   On: 02/18/2013 02:56    Oren Binet, MD  [facility] Pager:[phone]  If 7PM-7AM, please contact night-coverage [email or web address] Password [facility] 02/18/2013, 10:33 AM   LOS: 0 days

## 2013-02-18 NOTE — ED Notes (Signed)
Per EMS pt ground daughter called EMS because pt has being confused since early am on 02/17/13,  Patient usually alert and oriented X4, that is her baseline per her family. Denies any pain or discomfort.

## 2013-02-18 NOTE — ED Notes (Signed)
CBG 247MH/dL

## 2013-02-18 NOTE — ED Provider Notes (Signed)
CSN: [id number]     Arrival date & time 02/18/13  0148 History   First MD Initiated Contact with Patient 02/18/13 [id number]     Chief Complaint  Patient presents with  . Altered Mental Status   (Consider location/radiation/quality/duration/timing/severity/associated sxs/prior Treatment) HPI History provided by family - is a diabetic who lives at home with family, tonight around midnight was awake, developed confusion and was talking about her deceased son and talking "nonsense" per her granddaughter. History of same with UTI in the past. No fall. No unilateral weakness. Patient has had some hemorrhoids recently, family reports rectal pain but no blood in stools, rectal bleeding or pain complaints otherwise. Patient denies any pain complaints to me. Past Medical History  Diagnosis Date  . Hypertension   . Insulin dependent diabetes mellitus 03/01/2012  . 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 Closed right ankle fracture   . Hyperlipidemia   . Fracture of femur, distal, right, closed 03/01/2012  . Obesity (BMI 30-39.9) 03/03/2012  . Hemorrhoid    Past Surgical History  Procedure Laterality Date  . Below knee leg amputation      Right  . Orif femur fracture  03/01/2012    Procedure: OPEN REDUCTION INTERNAL FIXATION (ORIF) DISTAL FEMUR FRACTURE;  Surgeon: Rozanna Box, MD;  Location: [facility];  Service: Orthopedics;  Laterality: Right;  ORIF right femur  . Orif femur fracture  03/03/2012    Dr Marcelino Scot   right leg   No family history on file. History  Substance Use Topics  . Smoking status: Former Research scientist (life sciences)  . Smokeless tobacco: Never Used  . Alcohol Use: No   OB History   Grav Para Term Preterm Abortions TAB SAB Ect Mult Living                 Review of Systems  Constitutional: Negative for fever and chills.  Eyes: Negative for visual disturbance.  Respiratory: Negative for shortness of breath.   Cardiovascular: Negative for chest pain.  Gastrointestinal: Negative for vomiting,  abdominal pain and anal bleeding.  Genitourinary: Negative for dysuria.  Musculoskeletal: Negative for back pain and neck stiffness.  Skin: Negative for rash.  Neurological: Negative for seizures.  All other systems reviewed and are negative.    Allergies  Review of patient's allergies indicates no known allergies.  Home Medications   Current Outpatient Rx  Name  Route  Sig  Dispense  Refill  . cloNIDine (CATAPRES) 0.1 MG tablet   Oral   Take 0.05 mg by mouth 2 (two) times daily.         Marland Kitchen glipiZIDE (GLUCOTROL XL) 5 MG 24 hr tablet   Oral   Take 1 tablet (5 mg total) by mouth daily with breakfast.   30 tablet   1   . insulin detemir (LEVEMIR) 100 UNIT/ML injection   Subcutaneous   Inject 20 Units into the skin daily.         Marland Kitchen lidocaine (XYLOCAINE JELLY) 2 % jelly   Other   1 application by Other route as needed. Inject tube in rectum PRN pain   4 mL   0     Dispense #10 of 27mL tubes   . Multiple Vitamin (MULTIVITAMIN WITH MINERALS) TABS   Oral   Take 1 tablet by mouth daily.         . simvastatin (ZOCOR) 40 MG tablet   Oral   Take 1 tablet (40 mg total) by mouth every evening.  30 tablet   1   . starch (ANUSOL) 51 % suppository   Rectal   Place 1 suppository rectally as needed for pain.   24 suppository   0    BP 151/93  Pulse 96  Temp(Src) 99.2 F (37.3 C)  Resp 27  SpO2 100% Physical Exam  Constitutional: She appears well-developed and well-nourished.  HENT:  Head: Normocephalic and atraumatic.  Eyes: EOM are normal. Pupils are equal, round, and reactive to light.  Neck: Neck supple.  Cardiovascular: Regular rhythm and intact distal pulses.   Tachycardic  Pulmonary/Chest: Effort normal. No respiratory distress. She exhibits no tenderness.  Abdominal: Soft. Bowel sounds are normal. She exhibits no distension.  Musculoskeletal: Normal range of motion. She exhibits no edema.  Neurological:  Awake alert and oriented to self. Not oriented  to date, time, month, year. No unilateral deficits. Speech clear. No pronator drift. Equal grips, triceps, biceps. Equal dorsi plantar flexion.  Skin: Skin is warm and dry.    ED Course  Procedures (including critical care time) Labs Review Labs Reviewed  URINALYSIS, ROUTINE W REFLEX MICROSCOPIC - Abnormal; Notable for the following:    APPearance TURBID (*)    Glucose, UA 100 (*)    Hgb urine dipstick MODERATE (*)    Leukocytes, UA LARGE (*)    All other components within normal limits  COMPREHENSIVE METABOLIC PANEL - Abnormal; Notable for the following:    Glucose, Bld 287 (*)    Albumin 3.2 (*)    GFR calc non Af Amer 84 (*)    All other components within normal limits  GLUCOSE, CAPILLARY - Abnormal; Notable for the following:    Glucose-Capillary 247 (*)    All other components within normal limits  URINE MICROSCOPIC-ADD ON - Abnormal; Notable for the following:    Bacteria, UA MANY (*)    All other components within normal limits  POCT I-STAT, CHEM 8 - Abnormal; Notable for the following:    Potassium 3.6 (*)    Glucose, Bld 291 (*)    All other components within normal limits  URINE CULTURE  CBC  CG4 I-STAT (LACTIC ACID)   Imaging Review Dg Chest Portable 1 View  02/18/2013   CLINICAL DATA:  Altered mental status.  Confusion.  EXAM: PORTABLE CHEST - 1 VIEW  COMPARISON:  Chest radiograph performed 02/29/2012  FINDINGS: The lungs are mildly hypoexpanded but appear grossly clear. There is no evidence of focal opacification, pleural effusion or pneumothorax.  The cardiomediastinal silhouette is within normal limits. No acute osseous abnormalities are seen.  IMPRESSION: Lungs mildly hypoexpanded but grossly clear.   Electronically Signed   By: Garald Balding M.D.   On: 02/18/2013 02:56   Potassium for hypokalemia Antibiotics for UTI Medicine consult, Dr. Alcario Drought to admit MDM  Dx: Acute encephalopathy, UTI  Imaging, labs, urinalysis reviewed as above. IV antibiotics  provided. MED admit  Teressa Lower, MD 02/18/13 [phone]

## 2013-02-18 NOTE — Progress Notes (Signed)
Pt arrived on floor. Alert and oriented. Call light placed within reached. Bed in its lowest position. Oriented to room and staff. Will continue to monitor.

## 2013-02-19 DIAGNOSIS — S88119A Complete traumatic amputation at level between knee and ankle, unspecified lower leg, initial encounter: Secondary | ICD-10-CM

## 2013-02-19 LAB — BASIC METABOLIC PANEL
BUN: 17 mg/dL (ref 6–23)
CHLORIDE: 107 meq/L (ref 96–112)
CO2: 24 mEq/L (ref 19–32)
Calcium: 8.7 mg/dL (ref 8.4–10.5)
Creatinine, Ser: 0.82 mg/dL (ref 0.50–1.10)
GFR calc non Af Amer: 69 mL/min — ABNORMAL LOW (ref >90)
GFR, EST AFRICAN AMERICAN: 80 mL/min — AB (ref >90)
Glucose, Bld: 135 mg/dL — ABNORMAL HIGH (ref 70–99)
Potassium: 3.9 mEq/L (ref 3.7–5.3)
Sodium: 143 mEq/L (ref 137–147)

## 2013-02-19 LAB — CBC
HCT: 30.6 % — ABNORMAL LOW (ref 36.0–46.0)
Hemoglobin: 9.7 g/dL — ABNORMAL LOW (ref 12.0–15.0)
MCH: 25.9 pg — ABNORMAL LOW (ref 26.0–34.0)
MCHC: 31.7 g/dL (ref 30.0–36.0)
MCV: 81.8 fL (ref 78.0–100.0)
PLATELETS: 268 10*3/uL (ref 150–400)
RBC: 3.74 MIL/uL — ABNORMAL LOW (ref 3.87–5.11)
RDW: 15.4 % (ref 11.5–15.5)
WBC: 5.8 10*3/uL (ref 4.0–10.5)

## 2013-02-19 LAB — GLUCOSE, CAPILLARY
Glucose-Capillary: 121 mg/dL — ABNORMAL HIGH (ref 70–99)
Glucose-Capillary: 222 mg/dL — ABNORMAL HIGH (ref 70–99)
Glucose-Capillary: 181 mg/dL — ABNORMAL HIGH (ref 70–99)
Glucose-Capillary: 125 mg/dL — ABNORMAL HIGH (ref 70–99)

## 2013-02-19 MED ORDER — WHITE PETROLATUM GEL
Status: AC
Start: 2013-02-19 — End: 2013-02-19
  Administered 2013-02-19: 0.2
  Filled 2013-02-19: qty 10

## 2013-02-19 MED ORDER — TRAMADOL HCL 50 MG PO TABS
50.0000 mg | ORAL_TABLET | Freq: Four times a day (QID) | ORAL | Status: DC | PRN
  Filled 2013-02-19: qty 2

## 2013-02-19 NOTE — Progress Notes (Addendum)
PATIENT DETAILS Name: Judith [name] Age: 73 y.o. Sex: female Date of Birth: September 01, 1940 Admit Date: 02/18/2013 Admitting Physician Etta Quill, DO PXT:[id number],NIOEV A, MD  Subjective: Feels ok, no complaints, mentation improving  Assessment/Plan:  Acute encephalopathy - Suspected secondary to UTI - i  d/w with daughter today and she feels that pt is close to baseline now, but she may have some cognitive dysfunction at baseline. - improving with treatment of UTI  UTI (lower urinary tract infection) - Continue treatment with Rocephin-day 2 - Urine cultures pending    Insulin dependent diabetes mellitus - Continue Levemir and sliding scale - Will follow CBGs and adjust accordingly - Resume glipizide on discharge   Hypertension - Continue with clonidine- -stable today  Dyslipidemia - Continue statins  History of right BKA in 2010  Disposition: Remain inpatient, home tomorrow, pending cultures  DVT Prophylaxis: Prophylactic Heparin   Code Status: Full code  Family Communication i d/w daughter today  Procedures:  None  CONSULTS:  None  Time spent 25 minutes-    MEDICATIONS: Scheduled Meds: . cefTRIAXone (ROCEPHIN)  IV  1 g Intravenous Q24H  . cloNIDine  0.05 mg Oral TID  . heparin  5,000 Units Subcutaneous Q8H  . insulin aspart  0-15 Units Subcutaneous TID WC  . insulin detemir  10 Units Subcutaneous Daily  . simvastatin  40 mg Oral QPM   Continuous Infusions: . sodium chloride 50 mL/hr at 02/18/13 2129   PRN Meds:.hydrocortisone, lidocaine  Antibiotics: Anti-infectives   Start     Dose/Rate Route Frequency Ordered Stop   02/19/13 0415  cefTRIAXone (ROCEPHIN) 1 g in dextrose 5 % 50 mL IVPB     1 g 100 mL/hr over 30 Minutes Intravenous Every 24 hours 02/18/13 0425     02/18/13 0415  cefTRIAXone (ROCEPHIN) 1 g in dextrose 5 % 50 mL IVPB     1 g 100 mL/hr over 30 Minutes Intravenous  Once 02/18/13 0401 02/18/13 0456       PHYSICAL  EXAM: Vital signs in last 24 hours: Filed Vitals:   02/18/13 1441 02/18/13 1658 02/18/13 2052 02/19/13 0617  BP: 131/69 118/72 121/68 120/68  Pulse: 100  84 87  Temp: 97.9 F (36.6 C)  98.6 F (37 C) 98 F (36.7 C)  TempSrc: Oral  Oral Oral  Resp: 18  18 18   Height:      Weight:      SpO2: 100%  100% 100%    Weight change:  Filed Weights   02/18/13 0555  Weight: 85.14 kg (187 lb 11.2 oz)   Body mass index is 33.26 kg/(m^2).   Gen Exam: Awake, alert, oriented to self, place and partly to time, occasional confusion   Neck: Supple, No JVD.   Chest: B/L Clear.   CVS: S1 S2 Regular, no murmurs.  Abdomen: soft, BS +, non tender, non distended.  Extremities: no edema, status post right BKA Neurologic: Non Focal.   Skin: No Rash.   Wounds: N/A.    Intake/Output from previous day:  Intake/Output Summary (Last 24 hours) at 02/19/13 0733 Last data filed at 02/19/13 0500  Gross per 24 hour  Intake   2255 ml  Output      0 ml  Net   2255 ml     LAB RESULTS: CBC  Recent Labs Lab 02/18/13 0300 02/18/13 0319 02/19/13 0545  WBC 6.9  --  5.8  HGB 12.5 12.9 9.7*  HCT 37.6 38.0 30.6*  PLT 288  --  268  MCV 81.0  --  81.8  MCH 26.9  --  25.9*  MCHC 33.2  --  31.7  RDW 15.0  --  15.4    Chemistries   Recent Labs Lab 02/18/13 0300 02/18/13 0319 02/19/13 0545  NA 141 142 143  K 3.8 3.6* 3.9  CL 101 102 107  CO2 25  --  24  GLUCOSE 287* 291* 135*  BUN 14 13 17   CREATININE 0.70 0.80 0.82  CALCIUM 9.5  --  8.7    CBG:  Recent Labs Lab 02/18/13 0306 02/18/13 0739 02/18/13 1134 02/18/13 1800 02/18/13 2050  GLUCAP 247* 218* 101* 184* 77    GFR Estimated Creatinine Clearance: 63.2 ml/min (by C-G formula based on Cr of 0.82).  Coagulation profile No results found for this basename: INR, PROTIME,  in the last 168 hours  Cardiac Enzymes No results found for this basename: CK, CKMB, TROPONINI, MYOGLOBIN,  in the last 168 hours  No components found  with this basename: POCBNP,  No results found for this basename: DDIMER,  in the last 72 hours No results found for this basename: HGBA1C,  in the last 72 hours No results found for this basename: CHOL, HDL, LDLCALC, TRIG, CHOLHDL, LDLDIRECT,  in the last 72 hours No results found for this basename: TSH, T4TOTAL, FREET3, T3FREE, THYROIDAB,  in the last 72 hours No results found for this basename: VITAMINB12, FOLATE, FERRITIN, TIBC, IRON, RETICCTPCT,  in the last 72 hours No results found for this basename: LIPASE, AMYLASE,  in the last 72 hours  Urine Studies No results found for this basename: UACOL, UAPR, USPG, UPH, UTP, UGL, UKET, UBIL, UHGB, UNIT, UROB, ULEU, UEPI, UWBC, URBC, UBAC, CAST, CRYS, UCOM, BILUA,  in the last 72 hours  MICROBIOLOGY: No results found for this or any previous visit (from the past 240 hour(s)).  RADIOLOGY STUDIES/RESULTS: Dg Chest Portable 1 View  02/18/2013   CLINICAL DATA:  Altered mental status.  Confusion.  EXAM: PORTABLE CHEST - 1 VIEW  COMPARISON:  Chest radiograph performed 02/29/2012  FINDINGS: The lungs are mildly hypoexpanded but appear grossly clear. There is no evidence of focal opacification, pleural effusion or pneumothorax.  The cardiomediastinal silhouette is within normal limits. No acute osseous abnormalities are seen.  IMPRESSION: Lungs mildly hypoexpanded but grossly clear.   Electronically Signed   By: Garald Balding M.D.   On: 02/18/2013 02:56    Domenic Polite, MD  [facility] Pager:[phone]  If 7PM-7AM, please contact night-coverage [email or web address] Password [facility] 02/19/2013, 7:33 AM   LOS: 1 day

## 2013-02-20 LAB — GLUCOSE, CAPILLARY
Glucose-Capillary: 150 mg/dL — ABNORMAL HIGH (ref 70–99)
Glucose-Capillary: 185 mg/dL — ABNORMAL HIGH (ref 70–99)
Glucose-Capillary: 177 mg/dL — ABNORMAL HIGH (ref 70–99)

## 2013-02-20 LAB — CBC
HEMATOCRIT: 35.1 % — AB (ref 36.0–46.0)
Hemoglobin: 11.5 g/dL — ABNORMAL LOW (ref 12.0–15.0)
MCH: 26.9 pg (ref 26.0–34.0)
MCHC: 32.8 g/dL (ref 30.0–36.0)
MCV: 82 fL (ref 78.0–100.0)
Platelets: 287 10*3/uL (ref 150–400)
RBC: 4.28 MIL/uL (ref 3.87–5.11)
RDW: 15.3 % (ref 11.5–15.5)
WBC: 6.6 10*3/uL (ref 4.0–10.5)

## 2013-02-20 LAB — URINE CULTURE

## 2013-02-20 MED ORDER — CEPHALEXIN 500 MG PO CAPS
500.0000 mg | ORAL_CAPSULE | Freq: Two times a day (BID) | ORAL | Status: DC
  Administered 2013-02-20: 500 mg via ORAL
  Filled 2013-02-20 (×2): qty 1

## 2013-02-20 MED ORDER — CLONIDINE HCL 0.1 MG PO TABS: 0.0500 mg | ORAL_TABLET | Freq: Three times a day (TID) | ORAL | Status: DC

## 2013-02-20 MED ORDER — CEPHALEXIN 500 MG PO CAPS: 500.0000 mg | ORAL_CAPSULE | Freq: Two times a day (BID) | ORAL | Status: DC

## 2013-02-20 NOTE — Care Management Note (Signed)
    Page 1 of 1   02/20/2013     2:24:05 PM   CARE MANAGEMENT NOTE 02/20/2013  Patient:  Judith [name], Judith [name]   Account Number:  [id number]  Date Initiated:  02/20/2013  Documentation initiated by:  Tomi Bamberger  Subjective/Objective Assessment:   dx uti  admit- lives with daughter , [name] [phone].     Action/Plan:   pt eval rec hhpt/ot   Anticipated DC Date:  02/20/2013   Anticipated DC Plan:  [place]  CM consult      [facility] Choice  HOME HEALTH   Choice offered to / List presented to:  C-4 Adult Children        HH arranged  HH-2 PT  HH-3 OT      [place].   Status of service:  Completed, signed off Medicare Important Message given?   (If response is "NO", the following Medicare IM given date fields will be blank) Date Medicare IM given:   Date Additional Medicare IM given:    Discharge Disposition:  [place]  Per UR Regulation:  Reviewed for med. necessity/level of care/duration of stay  If discussed at [place] of Stay Meetings, dates discussed:    Comments:  02/20/13 14:01 Tomi Bamberger RN, BSN [phone] patient for dc today, lives with daughter, who wants [facility] for HHPt, ot.  REferral made to [facility] [place] notified. Soc will begin 24-48 hrs post discharge.

## 2013-02-20 NOTE — Discharge Summary (Signed)
Physician Discharge Summary  TIAJAH OYSTER VPX:[id number] DOB: 06-20-1940 DOA: 02/18/2013  PCP: Philis Fendt, MD  Admit date: 02/18/2013 Discharge date: 02/20/2013  Time spent: 35 minutes  Recommendations for Outpatient Follow-up:  1. Home health 2. Titrate blood pressure and diabetic meds  Discharge Diagnoses:  Principal Problem:   UTI (lower urinary tract infection) Active Problems:   Insulin dependent diabetes mellitus   Delirium due to another medical condition   Discharge Condition: improved  Diet recommendation: diabetic  Filed Weights   02/18/13 0555  Weight: 85.14 kg (187 lb 11.2 oz)    History of present illness:  Judith [name] is a 73 y.o. female who presents to the ED with confusion. Symptoms onset tonight around midnight. Per daughter she was talking about her deceased son and "nonsense". History of same with UTI in past.  In the ED she is found to have a UTI   Hospital Course:  Acute encephalopathy  - Suspected secondary to UTI   UTI (lower urinary tract infection)  - Continue treatment with Rocephin-day 3 - change to keflex - ecoli  Insulin dependent diabetes mellitus  - Continue Levemir and sliding scale  - Will follow CBGs and adjust accordingly  - Resume glipizide on discharge   Hypertension  - Continue with clonidine-    Dyslipidemia  - Continue statins   History of right BKA in 2010        Procedures:  none  Consultations:  none  Discharge Exam: Filed Vitals:   02/20/13 1403  BP: 150/85  Pulse: 82  Temp: 98.3 F (36.8 C)  Resp: 20     Discharge Instructions      Discharge Orders   Future Orders Complete By Expires   Diet - low sodium heart healthy  As directed    Diet Carb Modified  As directed    Discharge instructions  As directed    Comments:     Home health PT 24 hour care by family   Increase activity slowly  As directed        Medication List         cephALEXin 500 MG capsule  Commonly known as:   KEFLEX  Take 1 capsule (500 mg total) by mouth every 12 (twelve) hours.     cloNIDine 0.1 MG tablet  Commonly known as:  CATAPRES  Take 0.5 tablets (0.05 mg total) by mouth 3 (three) times daily.     glipiZIDE 5 MG 24 hr tablet  Commonly known as:  GLUCOTROL XL  Take 1 tablet (5 mg total) by mouth daily with breakfast.     insulin detemir 100 UNIT/ML injection  Commonly known as:  LEVEMIR  Inject 20 Units into the skin daily.     lidocaine 2 % jelly  Commonly known as:  XYLOCAINE JELLY  1 application by Other route as needed. Inject tube in rectum PRN pain     multivitamin with minerals Tabs tablet  Take 1 tablet by mouth daily.     simvastatin 40 MG tablet  Commonly known as:  ZOCOR  Take 1 tablet (40 mg total) by mouth every evening.     starch 51 % suppository  Commonly known as:  ANUSOL  Place 1 suppository rectally as needed for pain.       No Known Allergies    The results of significant diagnostics from this hospitalization (including imaging, microbiology, ancillary and laboratory) are listed below for reference.    Significant Diagnostic Studies: Dg Chest  Portable 1 View  02/18/2013   CLINICAL DATA:  Altered mental status.  Confusion.  EXAM: PORTABLE CHEST - 1 VIEW  COMPARISON:  Chest radiograph performed 02/29/2012  FINDINGS: The lungs are mildly hypoexpanded but appear grossly clear. There is no evidence of focal opacification, pleural effusion or pneumothorax.  The cardiomediastinal silhouette is within normal limits. No acute osseous abnormalities are seen.  IMPRESSION: Lungs mildly hypoexpanded but grossly clear.   Electronically Signed   By: Garald Balding M.D.   On: 02/18/2013 02:56    Microbiology: Recent Results (from the past 240 hour(s))  URINE CULTURE     Status: None   Collection Time    02/18/13  2:59 AM      Result Value Range Status   Specimen Description URINE, CLEAN CATCH   Final   Special Requests NONE   Final   Culture  Setup Time      Final   Value: 02/18/2013 15:03     Performed at [place]     Final   Value: >=100,000 COLONIES/ML     Performed at [facility]   Culture     Final   Value: ESCHERICHIA COLI     Performed at [facility]   Report Status 02/20/2013 FINAL   Final   Organism ID, Bacteria ESCHERICHIA COLI   Final     Labs: Basic Metabolic Panel:  Recent Labs Lab 02/18/13 0300 02/18/13 0319 02/19/13 0545  NA 141 142 143  K 3.8 3.6* 3.9  CL 101 102 107  CO2 25  --  24  GLUCOSE 287* 291* 135*  BUN 14 13 17   CREATININE 0.70 0.80 0.82  CALCIUM 9.5  --  8.7   Liver Function Tests:  Recent Labs Lab 02/18/13 0300  AST 12  ALT 10  ALKPHOS 87  BILITOT 0.3  PROT 7.6  ALBUMIN 3.2*   No results found for this basename: LIPASE, AMYLASE,  in the last 168 hours No results found for this basename: AMMONIA,  in the last 168 hours CBC:  Recent Labs Lab 02/18/13 0300 02/18/13 0319 02/19/13 0545 02/20/13 1000  WBC 6.9  --  5.8 6.6  HGB 12.5 12.9 9.7* 11.5*  HCT 37.6 38.0 30.6* 35.1*  MCV 81.0  --  81.8 82.0  PLT 288  --  268 287   Cardiac Enzymes: No results found for this basename: CKTOTAL, CKMB, CKMBINDEX, TROPONINI,  in the last 168 hours BNP: BNP (last 3 results) No results found for this basename: PROBNP,  in the last 8760 hours CBG:  Recent Labs Lab 02/19/13 1234 02/19/13 1852 02/19/13 2104 02/20/13 0819 02/20/13 1119  GLUCAP 222* 181* 125* 150* 185*       Signed:  Kalyn Dimattia  [facility] 02/20/2013, 3:00 PM

## 2013-02-20 NOTE — Progress Notes (Signed)
PATIENT DETAILS Name: Judith [name] Age: 73 y.o. Sex: female Date of Birth: [birth year] Admit Date: 02/18/2013 Admitting Physician Etta Quill, DO GYJ:EHUDJSH,FWYOV A, MD  Subjective: Feels better  Assessment/Plan:  Acute encephalopathy - Suspected secondary to UTI - LM with daughter, suspect back to baseline - improving with treatment of UTI  UTI (lower urinary tract infection) - Continue treatment with Rocephin-day 3 - Urine cultures pending- will d/c once results back    Insulin dependent diabetes mellitus - Continue Levemir and sliding scale - Will follow CBGs and adjust accordingly - Resume glipizide on discharge   Hypertension - Continue with clonidine- -stable today  Dyslipidemia - Continue statins  History of right BKA in 2010  Anemia -recheck CBC -large drop yesterday   Disposition: Remain inpatient, home once cultures back cultures  DVT Prophylaxis: Prophylactic Heparin   Code Status: Full code  Family Communication LM for granddaughter- Ms. Amedeo Plenty  Procedures:  None  CONSULTS:  None  Time spent 25 minutes-    MEDICATIONS: Scheduled Meds: . cefTRIAXone (ROCEPHIN)  IV  1 g Intravenous Q24H  . cloNIDine  0.05 mg Oral TID  . heparin  5,000 Units Subcutaneous Q8H  . insulin aspart  0-15 Units Subcutaneous TID WC  . insulin detemir  10 Units Subcutaneous Daily  . simvastatin  40 mg Oral QPM   Continuous Infusions: . sodium chloride 50 mL/hr at 02/19/13 2239   PRN Meds:.hydrocortisone, lidocaine, traMADol  Antibiotics: Anti-infectives   Start     Dose/Rate Route Frequency Ordered Stop   02/19/13 0415  cefTRIAXone (ROCEPHIN) 1 g in dextrose 5 % 50 mL IVPB     1 g 100 mL/hr over 30 Minutes Intravenous Every 24 hours 02/18/13 0425     02/18/13 0415  cefTRIAXone (ROCEPHIN) 1 g in dextrose 5 % 50 mL IVPB     1 g 100 mL/hr over 30 Minutes Intravenous  Once 02/18/13 0401 02/18/13 0456       PHYSICAL EXAM: Vital signs in  last 24 hours: Filed Vitals:   02/19/13 0617 02/19/13 1337 02/19/13 2109 02/20/13 0602  BP: 120/68 105/65 113/70 121/68  Pulse: 87 92 77 78  Temp: 98 F (36.7 C) 98.5 F (36.9 C) 98.3 F (36.8 C) 98.6 F (37 C)  TempSrc: Oral Oral Oral Oral  Resp: 18 19 18 18   Height:      Weight:      SpO2: 100% 100% 96% 98%    Weight change:  Filed Weights   02/18/13 0555  Weight: 85.14 kg (187 lb 11.2 oz)   Body mass index is 33.26 kg/(m^2).   Gen Exam: Awake, alert, oriented to self, place and partly to time (2018) Neck: Supple, No JVD.   Chest: B/L Clear.   CVS: S1 S2 Regular, no murmurs.  Abdomen: soft, BS +, non tender, non distended.  Extremities: no edema, status post right BKA Neurologic: Non Focal.   Skin: No Rash.     Intake/Output from previous day:  Intake/Output Summary (Last 24 hours) at 02/20/13 0832 Last data filed at 02/19/13 1900  Gross per 24 hour  Intake   1180 ml  Output      0 ml  Net   1180 ml     LAB RESULTS: CBC  Recent Labs Lab 02/18/13 0300 02/18/13 0319 02/19/13 0545  WBC 6.9  --  5.8  HGB 12.5 12.9 9.7*  HCT 37.6 38.0 30.6*  PLT 288  --  268  MCV 81.0  --  81.8  MCH 26.9  --  25.9*  MCHC 33.2  --  31.7  RDW 15.0  --  15.4    Chemistries   Recent Labs Lab 02/18/13 0300 02/18/13 0319 02/19/13 0545  NA 141 142 143  K 3.8 3.6* 3.9  CL 101 102 107  CO2 25  --  24  GLUCOSE 287* 291* 135*  BUN 14 13 17   CREATININE 0.70 0.80 0.82  CALCIUM 9.5  --  8.7    CBG:  Recent Labs Lab 02/19/13 0757 02/19/13 1234 02/19/13 1852 02/19/13 2104 02/20/13 0819  GLUCAP 121* 222* 181* 125* 150*    GFR Estimated Creatinine Clearance: 63.2 ml/min (by C-G formula based on Cr of 0.82).  Coagulation profile No results found for this basename: INR, PROTIME,  in the last 168 hours  Cardiac Enzymes No results found for this basename: CK, CKMB, TROPONINI, MYOGLOBIN,  in the last 168 hours  No components found with this basename: POCBNP,   No results found for this basename: DDIMER,  in the last 72 hours No results found for this basename: HGBA1C,  in the last 72 hours No results found for this basename: CHOL, HDL, LDLCALC, TRIG, CHOLHDL, LDLDIRECT,  in the last 72 hours No results found for this basename: TSH, T4TOTAL, FREET3, T3FREE, THYROIDAB,  in the last 72 hours No results found for this basename: VITAMINB12, FOLATE, FERRITIN, TIBC, IRON, RETICCTPCT,  in the last 72 hours No results found for this basename: LIPASE, AMYLASE,  in the last 72 hours  Urine Studies No results found for this basename: UACOL, UAPR, USPG, UPH, UTP, UGL, UKET, UBIL, UHGB, UNIT, UROB, ULEU, UEPI, UWBC, URBC, UBAC, CAST, CRYS, UCOM, BILUA,  in the last 72 hours  MICROBIOLOGY: Recent Results (from the past 240 hour(s))  URINE CULTURE     Status: None   Collection Time    02/18/13  2:59 AM      Result Value Range Status   Specimen Description URINE, CLEAN CATCH   Final   Special Requests NONE   Final   Culture  Setup Time     Final   Value: 02/18/2013 15:03     Performed at [facility]     Final   Value: >=100,000 COLONIES/ML     Performed at [facility]   Culture     Final   Value: [place]     Performed at [facility]   Report Status PENDING   Incomplete    RADIOLOGY STUDIES/RESULTS: Dg Chest Portable 1 View  02/18/2013   CLINICAL DATA:  Altered mental status.  Confusion.  EXAM: PORTABLE CHEST - 1 VIEW  COMPARISON:  Chest radiograph performed 02/29/2012  FINDINGS: The lungs are mildly hypoexpanded but appear grossly clear. There is no evidence of focal opacification, pleural effusion or pneumothorax.  The cardiomediastinal silhouette is within normal limits. No acute osseous abnormalities are seen.  IMPRESSION: Lungs mildly hypoexpanded but grossly clear.   Electronically Signed   By: Garald Balding M.D.   On: 02/18/2013 02:56    Eulogio Bear, DO  [facility] Pager:[phone]  [phone]  If 7PM-7AM, please contact night-coverage [email or web address] Password [facility] 02/20/2013, 8:32 AM   LOS: 2 days

## 2013-02-20 NOTE — Progress Notes (Signed)
Nsg Discharge Note  Admit Date:  02/18/2013 Discharge date: 02/20/2013   Judith [name] to be D/C'd Home per MD order.  AVS completed.  Copy for chart, and copy for patient signed, and dated. Patient/caregiver able to verbalize understanding.  Discharge Medication:   Medication List         cephALEXin 500 MG capsule  Commonly known as:  KEFLEX  Take 1 capsule (500 mg total) by mouth every 12 (twelve) hours.     cloNIDine 0.1 MG tablet  Commonly known as:  CATAPRES  Take 0.5 tablets (0.05 mg total) by mouth 3 (three) times daily.     glipiZIDE 5 MG 24 hr tablet  Commonly known as:  GLUCOTROL XL  Take 1 tablet (5 mg total) by mouth daily with breakfast.     insulin detemir 100 UNIT/ML injection  Commonly known as:  LEVEMIR  Inject 20 Units into the skin daily.     lidocaine 2 % jelly  Commonly known as:  XYLOCAINE JELLY  1 application by Other route as needed. Inject tube in rectum PRN pain     multivitamin with minerals Tabs tablet  Take 1 tablet by mouth daily.     simvastatin 40 MG tablet  Commonly known as:  ZOCOR  Take 1 tablet (40 mg total) by mouth every evening.     starch 51 % suppository  Commonly known as:  ANUSOL  Place 1 suppository rectally as needed for pain.        Discharge Assessment: Filed Vitals:   02/20/13 1603  BP: 163/80  Pulse:   Temp:   Resp:    Skin clean, dry and intact without evidence of skin break down, no evidence of skin tears noted. IV catheter discontinued intact. Site without signs and symptoms of complications - no redness or edema noted at insertion site, patient denies c/o pain - only slight tenderness at site.  Dressing with slight pressure applied.  D/c Instructions-Education: Discharge instructions given to patient/family with verbalized understanding. D/c education completed with patient/family including follow up instructions, medication list, d/c activities limitations if indicated, with other d/c instructions as  indicated by MD - patient able to verbalize understanding, all questions fully answered. Pt. Allowed her daughter to sign the AVS. Pt. Is more alert this evening and is at her baseline. Confusion was noted at times. The son states "She has a history of dementia." Patient instructed to return to ED, call 911, or call MD for any changes in condition.  Patient escorted via [place], and D/C home via private auto.  Dayle Points, RN 02/20/2013 5:37 PM

## 2013-05-17 ENCOUNTER — Emergency Department (HOSPITAL_COMMUNITY): Payer: Medicare Other

## 2013-05-17 ENCOUNTER — Encounter (HOSPITAL_COMMUNITY): Payer: Self-pay | Admitting: Emergency Medicine

## 2013-05-17 ENCOUNTER — Observation Stay (HOSPITAL_COMMUNITY)
Admission: EM | Admit: 2013-05-17 | Discharge: 2013-05-19 | Disposition: A | Discharge status: Home / Self Care | Payer: Medicare Other | Attending: Internal Medicine | Admitting: Internal Medicine

## 2013-05-17 DIAGNOSIS — D72829 Elevated white blood cell count, unspecified: Secondary | ICD-10-CM | POA: Insufficient documentation

## 2013-05-17 DIAGNOSIS — S88119A Complete traumatic amputation at level between knee and ankle, unspecified lower leg, initial encounter: Secondary | ICD-10-CM | POA: Insufficient documentation

## 2013-05-17 DIAGNOSIS — F05 Delirium due to known physiological condition: Secondary | ICD-10-CM

## 2013-05-17 DIAGNOSIS — Z7982 Long term (current) use of aspirin: Secondary | ICD-10-CM | POA: Insufficient documentation

## 2013-05-17 DIAGNOSIS — IMO0001 Reserved for inherently not codable concepts without codable children: Secondary | ICD-10-CM

## 2013-05-17 DIAGNOSIS — E785 Hyperlipidemia, unspecified: Secondary | ICD-10-CM | POA: Insufficient documentation

## 2013-05-17 DIAGNOSIS — E876 Hypokalemia: Secondary | ICD-10-CM

## 2013-05-17 DIAGNOSIS — Z6830 Body mass index (BMI) 30.0-30.9, adult: Secondary | ICD-10-CM | POA: Insufficient documentation

## 2013-05-17 DIAGNOSIS — E162 Hypoglycemia, unspecified: Secondary | ICD-10-CM

## 2013-05-17 DIAGNOSIS — E119 Type 2 diabetes mellitus without complications: Secondary | ICD-10-CM | POA: Insufficient documentation

## 2013-05-17 DIAGNOSIS — I10 Essential (primary) hypertension: Secondary | ICD-10-CM

## 2013-05-17 DIAGNOSIS — Z89511 Acquired absence of right leg below knee: Secondary | ICD-10-CM

## 2013-05-17 DIAGNOSIS — Z794 Long term (current) use of insulin: Secondary | ICD-10-CM | POA: Insufficient documentation

## 2013-05-17 DIAGNOSIS — F0390 Unspecified dementia without behavioral disturbance: Secondary | ICD-10-CM | POA: Insufficient documentation

## 2013-05-17 DIAGNOSIS — Z87891 Personal history of nicotine dependence: Secondary | ICD-10-CM | POA: Insufficient documentation

## 2013-05-17 DIAGNOSIS — R4182 Altered mental status, unspecified: Principal | ICD-10-CM

## 2013-05-17 HISTORY — DX: Unspecified asthma, uncomplicated: J45.909

## 2013-05-17 HISTORY — DX: Gastro-esophageal reflux disease without esophagitis: K21.9

## 2013-05-17 HISTORY — DX: Pneumonia, unspecified organism: J18.9

## 2013-05-17 HISTORY — DX: Unspecified osteoarthritis, unspecified site: M19.90

## 2013-05-17 HISTORY — DX: Headache: R51

## 2013-05-17 HISTORY — DX: Personal history of other diseases of the digestive system: Z87.19

## 2013-05-17 HISTORY — DX: Personal history of peptic ulcer disease: Z87.11

## 2013-05-17 LAB — GLUCOSE, CAPILLARY
GLUCOSE-CAPILLARY: 169 mg/dL — AB (ref 70–99)
Glucose-Capillary: 135 mg/dL — ABNORMAL HIGH (ref 70–99)
Glucose-Capillary: 141 mg/dL — ABNORMAL HIGH (ref 70–99)
Glucose-Capillary: 40 mg/dL — CL (ref 70–99)

## 2013-05-17 LAB — CBC
HCT: 38.1 % (ref 36.0–46.0)
Hemoglobin: 12.8 g/dL (ref 12.0–15.0)
MCH: 26.9 pg (ref 26.0–34.0)
MCHC: 33.6 g/dL (ref 30.0–36.0)
MCV: 80.2 fL (ref 78.0–100.0)
Platelets: 279 10*3/uL (ref 150–400)
RBC: 4.75 MIL/uL (ref 3.87–5.11)
RDW: 14.5 % (ref 11.5–15.5)
WBC: 11 10*3/uL — ABNORMAL HIGH (ref 4.0–10.5)

## 2013-05-17 LAB — URINALYSIS, ROUTINE W REFLEX MICROSCOPIC
Bilirubin Urine: NEGATIVE
GLUCOSE, UA: NEGATIVE mg/dL
Hgb urine dipstick: NEGATIVE
Ketones, ur: NEGATIVE mg/dL
LEUKOCYTES UA: NEGATIVE
Nitrite: NEGATIVE
Protein, ur: NEGATIVE mg/dL
SPECIFIC GRAVITY, URINE: 1.008 (ref 1.005–1.030)
Urobilinogen, UA: 0.2 mg/dL (ref 0.0–1.0)
pH: 5.5 (ref 5.0–8.0)

## 2013-05-17 LAB — COMPREHENSIVE METABOLIC PANEL
ALT: 9 U/L (ref 0–35)
AST: 13 U/L (ref 0–37)
Albumin: 3.4 g/dL — ABNORMAL LOW (ref 3.5–5.2)
Alkaline Phosphatase: 87 U/L (ref 39–117)
BILIRUBIN TOTAL: 0.5 mg/dL (ref 0.3–1.2)
BUN: 18 mg/dL (ref 6–23)
CHLORIDE: 98 meq/L (ref 96–112)
CO2: 27 meq/L (ref 19–32)
Calcium: 10.2 mg/dL (ref 8.4–10.5)
Creatinine, Ser: 0.84 mg/dL (ref 0.50–1.10)
GFR calc Af Amer: 78 mL/min — ABNORMAL LOW (ref >90)
GFR, EST NON AFRICAN AMERICAN: 67 mL/min — AB (ref >90)
Glucose, Bld: 161 mg/dL — ABNORMAL HIGH (ref 70–99)
Potassium: 3.5 mEq/L — ABNORMAL LOW (ref 3.7–5.3)
SODIUM: 138 meq/L (ref 137–147)
Total Protein: 7.7 g/dL (ref 6.0–8.3)

## 2013-05-17 LAB — DIFFERENTIAL
BASOS PCT: 0 % (ref 0–1)
Basophils Absolute: 0 10*3/uL (ref 0.0–0.1)
EOS PCT: 1 % (ref 0–5)
Eosinophils Absolute: 0.1 10*3/uL (ref 0.0–0.7)
Lymphocytes Relative: 20 % (ref 12–46)
Lymphs Abs: 2 10*3/uL (ref 0.7–4.0)
MONOS PCT: 11 % (ref 3–12)
Monocytes Absolute: 1 10*3/uL (ref 0.1–1.0)
NEUTROS PCT: 68 % (ref 43–77)
Neutro Abs: 6.5 10*3/uL (ref 1.7–7.7)

## 2013-05-17 LAB — CBG MONITORING, ED: Glucose-Capillary: 149 mg/dL — ABNORMAL HIGH (ref 70–99)

## 2013-05-17 LAB — RPR

## 2013-05-17 LAB — LACTIC ACID, PLASMA: LACTIC ACID, VENOUS: 1.4 mmol/L (ref 0.5–2.2)

## 2013-05-17 LAB — TSH: TSH: 2.29 u[IU]/mL (ref 0.350–4.500)

## 2013-05-17 LAB — VITAMIN B12: Vitamin B-12: 777 pg/mL (ref 211–911)

## 2013-05-17 LAB — FOLATE: Folate: >20 ng/mL

## 2013-05-17 LAB — AMMONIA: AMMONIA: 10 umol/L — AB (ref 11–60)

## 2013-05-17 MED ORDER — ONDANSETRON HCL 4 MG PO TABS: 4.0000 mg | ORAL_TABLET | Freq: Four times a day (QID) | ORAL | Status: DC | PRN

## 2013-05-17 MED ORDER — DEXTROSE 50 % IV SOLN
INTRAVENOUS | Status: AC
  Administered 2013-05-17: 25 mL
  Filled 2013-05-17: qty 50

## 2013-05-17 MED ORDER — POTASSIUM CHLORIDE CRYS ER 20 MEQ PO TBCR
40.0000 meq | EXTENDED_RELEASE_TABLET | Freq: Once | ORAL | Status: AC
  Administered 2013-05-17: 40 meq via ORAL
  Filled 2013-05-17: qty 2

## 2013-05-17 MED ORDER — SODIUM CHLORIDE 0.9 % IJ SOLN
3.0000 mL | Freq: Two times a day (BID) | INTRAMUSCULAR | Status: DC
  Administered 2013-05-17 – 2013-05-19 (×5): 3 mL via INTRAVENOUS

## 2013-05-17 MED ORDER — ONDANSETRON HCL 4 MG/2ML IJ SOLN: 4.0000 mg | Freq: Four times a day (QID) | INTRAMUSCULAR | Status: DC | PRN

## 2013-05-17 MED ORDER — SIMVASTATIN 40 MG PO TABS
40.0000 mg | ORAL_TABLET | Freq: Every evening | ORAL | Status: DC
Start: 2013-05-17 — End: 2013-05-19
  Administered 2013-05-17 – 2013-05-18 (×2): 40 mg via ORAL
  Filled 2013-05-17 (×3): qty 1

## 2013-05-17 MED ORDER — STARCH 51 % RE SUPP: 1.0000 | RECTAL | Status: DC | PRN

## 2013-05-17 MED ORDER — INSULIN DETEMIR 100 UNIT/ML ~~LOC~~ SOLN
20.0000 [IU] | Freq: Every day | SUBCUTANEOUS | Status: DC
  Administered 2013-05-17: 20 [IU] via SUBCUTANEOUS
  Filled 2013-05-17 (×2): qty 0.2

## 2013-05-17 MED ORDER — INSULIN ASPART 100 UNIT/ML ~~LOC~~ SOLN
0.0000 [IU] | Freq: Three times a day (TID) | SUBCUTANEOUS | Status: DC
  Administered 2013-05-17: 2 [IU] via SUBCUTANEOUS
  Administered 2013-05-17: 1 [IU] via SUBCUTANEOUS
  Administered 2013-05-18: 3 [IU] via SUBCUTANEOUS
  Administered 2013-05-18: 1 [IU] via SUBCUTANEOUS
  Administered 2013-05-19: 7 [IU] via SUBCUTANEOUS

## 2013-05-17 MED ORDER — INSULIN ASPART 100 UNIT/ML ~~LOC~~ SOLN: 0.0000 [IU] | Freq: Every day | SUBCUTANEOUS | Status: DC

## 2013-05-17 MED ORDER — ACETAMINOPHEN 650 MG RE SUPP: 650.0000 mg | Freq: Four times a day (QID) | RECTAL | Status: DC | PRN

## 2013-05-17 MED ORDER — ACETAMINOPHEN 325 MG PO TABS: 650.0000 mg | ORAL_TABLET | Freq: Four times a day (QID) | ORAL | Status: DC | PRN

## 2013-05-17 MED ORDER — CLONIDINE HCL 0.1 MG PO TABS
0.0500 mg | ORAL_TABLET | Freq: Three times a day (TID) | ORAL | Status: DC
  Administered 2013-05-17: 0.05 mg via ORAL
  Administered 2013-05-17: 13:00:00 via ORAL
  Administered 2013-05-18 – 2013-05-19 (×4): 0.05 mg via ORAL
  Filled 2013-05-17 (×9): qty 0.5

## 2013-05-17 MED ORDER — ENOXAPARIN SODIUM 40 MG/0.4ML ~~LOC~~ SOLN
40.0000 mg | SUBCUTANEOUS | Status: DC
  Administered 2013-05-17 – 2013-05-19 (×3): 40 mg via SUBCUTANEOUS
  Filled 2013-05-17 (×3): qty 0.4

## 2013-05-17 MED ORDER — ASPIRIN EC 81 MG PO TBEC
81.0000 mg | DELAYED_RELEASE_TABLET | Freq: Every day | ORAL | Status: DC
  Administered 2013-05-17 – 2013-05-19 (×3): 81 mg via ORAL
  Filled 2013-05-17 (×3): qty 1

## 2013-05-17 NOTE — Progress Notes (Signed)
Hypoglycemic Event  CBG: 40  Treatment: Juice and dextrose 50%  Symptoms: Sweaty and Lethargic   Follow-up CBG: Time:2154 CBG Result: 141  Possible Reasons for Event: unknown  Comments/MD notified:Hypoglycemic Protocol    Newt Minion  Remember to initiate Hypoglycemia Order Set & complete

## 2013-05-17 NOTE — ED Notes (Signed)
Per [facility] - pt from home - per pt's family on scene pt noted to be confused x1hr pta - pt normally A&Ox4 - pt w/ hx of same w/ dx of UTI. Pt admits to confusion. Pt denies any pain at present.

## 2013-05-17 NOTE — Progress Notes (Signed)
[facility] PROGRESS NOTE  HEDDY VIDANA FOY:[id number] DOB: [birth year] DOA: 05/17/2013 PCP: Philis Fendt, MD  Assessment/Plan: 1. Dementia.  -Present mental status changes could be secondary to progression of underlying dementia.  -Workup thus far has not revealed an obvious source of infection as a urine analysis and chest x-ray were negative.  -Labs did show a mild leukocytosis with white count of 11,000.  -Otherwise appears toxic and nonfocal.  2. Hypertension. -Blood pressure stable, with most recent blood pressure reading of 131/85. -Continue clonidine 0.1 mg by mouth 3 times a day  3. Insulin-dependent type 2 diabetes mellitus -Continue insulin detemir 20 units subcutaneous daily -Status scale coverage with Accu-Cheks  4. Dyslipidemia -Continue statin therapy  Code Status: Full code Family Communication: Attempted to call family members on cell phone, left message Disposition Plan: 24-hour observation   Consultants:  Physical therapy   HPI/Subjective: Patient is a pleasant 73 year old female with a past medical history of cognitive impairment, admitted to the medicine service on 05/17/2013 presenting with mental status changes. She is currently a resident at an assisted living facility. She was noted to be increased but confused, disoriented and talking to family members who have passed away. Initial workup included CT scan of brain which did not show acute intracranial abnormalities. Workup so far has included a urinalysis and chest x-ray which came back negative.  Objective: Filed Vitals:   05/17/13 1408  BP: 131/85  Pulse: 118  Temp:   Resp:     Intake/Output Summary (Last 24 hours) at 05/17/13 1510 Last data filed at 05/17/13 1230  Gross per 24 hour  Intake    960 ml  Output      0 ml  Net    960 ml   Filed Weights   05/17/13 0655  Weight: 80.3 kg (177 lb 0.5 oz)    Exam:   General:  Patient is pleasant, awake alert, no acute distress. She  is mildly confused  Cardiovascular: Regular rate rhythm normal S1-S2  Respiratory: Clear to auscultation bilaterally  Abdomen: Soft nontender nondistended  Musculoskeletal: No edema  Data Reviewed: Basic Metabolic Panel:  Recent Labs Lab 05/17/13 0104  NA 138  K 3.5*  CL 98  CO2 27  GLUCOSE 161*  BUN 18  CREATININE 0.84  CALCIUM 10.2   Liver Function Tests:  Recent Labs Lab 05/17/13 0104  AST 13  ALT 9  ALKPHOS 87  BILITOT 0.5  PROT 7.7  ALBUMIN 3.4*   No results found for this basename: LIPASE, AMYLASE,  in the last 168 hours  Recent Labs Lab 05/17/13 0916  AMMONIA 10*   CBC:  Recent Labs Lab 05/17/13 0104 05/17/13 0115  WBC 11.0*  --   NEUTROABS  --  6.5  HGB 12.8  --   HCT 38.1  --   MCV 80.2  --   PLT 279  --    Cardiac Enzymes: No results found for this basename: CKTOTAL, CKMB, CKMBINDEX, TROPONINI,  in the last 168 hours BNP (last 3 results) No results found for this basename: PROBNP,  in the last 8760 hours CBG:  Recent Labs Lab 05/17/13 0109 05/17/13 1112  GLUCAP 149* 169*    No results found for this or any previous visit (from the past 240 hour(s)).   Studies: Ct Head Wo Contrast  05/17/2013   CLINICAL DATA:  Acute onset of confusion for 4 hr.  EXAM: CT HEAD WITHOUT CONTRAST  TECHNIQUE: Contiguous axial images were obtained from the base of  the skull through the vertex without intravenous contrast.  COMPARISON:  CT HEAD W/O CM dated 11/27/2010  FINDINGS: Diffuse cerebral atrophy. Mild ventricular dilatation consistent with central atrophy. Low-attenuation changes in the deep white matter consistent with small vessel ischemia. No mass effect or midline shift. No abnormal extra-axial fluid collections. Gray-white matter junctions are distinct. Basal cisterns are not effaced. No evidence of acute intracranial hemorrhage. No depressed skull fractures. Visualized paranasal sinuses and mastoid air cells are not opacified.  IMPRESSION: No  acute intracranial abnormalities. Chronic atrophy and small vessel ischemic change.   Electronically Signed   By: Lucienne Capers M.D.   On: 05/17/2013 04:22   Dg Chest Port 1 View  05/17/2013   CLINICAL DATA:  Confusion.  Altered mental status.  Recent UTI.  EXAM: PORTABLE CHEST - 1 VIEW  COMPARISON:  DG CHEST 1V PORT dated 02/18/2013  FINDINGS: Shallow inspiration. The heart size and mediastinal contours are within normal limits. Both lungs are clear. The visualized skeletal structures are unremarkable.  IMPRESSION: No active disease.   Electronically Signed   By: Lucienne Capers M.D.   On: 05/17/2013 02:33    Scheduled Meds: . aspirin EC  81 mg Oral Daily  . cloNIDine  0.05 mg Oral TID  . enoxaparin (LOVENOX) injection  40 mg Subcutaneous Q24H  . insulin aspart  0-5 Units Subcutaneous QHS  . insulin aspart  0-9 Units Subcutaneous TID WC  . insulin detemir  20 Units Subcutaneous Daily  . simvastatin  40 mg Oral QPM  . sodium chloride  3 mL Intravenous Q12H   Continuous Infusions:   Principal Problem:   Altered mental status Active Problems:   Hypertension   Insulin dependent diabetes mellitus   Hx of right BKA   Altered mental state    Time spent: 35 min    [facility] Pager [phone]. If 7PM-7AM, please contact night-coverage at [email or web address], password [facility] 05/17/2013, 3:10 PM  LOS: 0 days

## 2013-05-17 NOTE — ED Provider Notes (Signed)
CSN: [id number]     Arrival date & time 05/17/13  0046 History   First MD Initiated Contact with Patient 05/17/13 0046     Chief Complaint  Patient presents with  . Altered Mental Status     (Consider location/radiation/quality/duration/timing/severity/associated sxs/prior Treatment) Patient is a 73 y.o. female presenting with altered mental status. The history is provided by the patient and a relative.  Altered Mental Status Her she was confused at about midnight. She was talking to relatives who are long gone. She did not have any fever and there's been no cough and no vomiting or diarrhea. A similar episode in January of this year it was from a urinary tract infection and she had to be hospitalized for that. Family member who is with her says she is slightly less confused now than she was at home. Patient has no complaints other than symptomatic hemorrhoids.  Past Medical History  Diagnosis Date  . Hypertension   . Insulin dependent diabetes mellitus 03/01/2012  . 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 Closed right ankle fracture   . Hyperlipidemia   . Fracture of femur, distal, right, closed 03/01/2012  . Obesity (BMI 30-39.9) 03/03/2012  . Hemorrhoid    Past Surgical History  Procedure Laterality Date  . Below knee leg amputation      Right  . Orif femur fracture  03/01/2012    Procedure: OPEN REDUCTION INTERNAL FIXATION (ORIF) DISTAL FEMUR FRACTURE;  Surgeon: Rozanna Box, MD;  Location: [facility];  Service: Orthopedics;  Laterality: Right;  ORIF right femur  . Orif femur fracture  03/03/2012    Dr Marcelino Scot   right leg  . Hemroid surgery     History reviewed. No pertinent family history. History  Substance Use Topics  . Smoking status: Former Research scientist (life sciences)  . Smokeless tobacco: Never Used  . Alcohol Use: No   OB History   Grav Para Term Preterm Abortions TAB SAB Ect Mult Living                 Review of Systems  Unable to perform ROS: Mental status change      Allergies   Review of patient's allergies indicates no known allergies.  Home Medications   Prior to Admission medications   Medication Sig Start Date End Date Taking? Authorizing Provider  cephALEXin (KEFLEX) 500 MG capsule Take 1 capsule (500 mg total) by mouth every 12 (twelve) hours. 02/20/13   Geradine Girt, DO  cloNIDine (CATAPRES) 0.1 MG tablet Take 0.5 tablets (0.05 mg total) by mouth 3 (three) times daily. 02/20/13   Geradine Girt, DO  glipiZIDE (GLUCOTROL XL) 5 MG 24 hr tablet Take 1 tablet (5 mg total) by mouth daily with breakfast. 03/23/12   Lavon Paganini Angiulli, PA-C  insulin detemir (LEVEMIR) 100 UNIT/ML injection Inject 20 Units into the skin daily.    Historical Provider, MD  lidocaine (XYLOCAINE JELLY) 2 % jelly 1 application by Other route as needed. Inject tube in rectum PRN pain 12/19/12   Clayton Bibles, PA-C  Multiple Vitamin (MULTIVITAMIN WITH MINERALS) TABS Take 1 tablet by mouth daily. 03/23/12   Lavon Paganini Angiulli, PA-C  simvastatin (ZOCOR) 40 MG tablet Take 1 tablet (40 mg total) by mouth every evening. 03/23/12   Lavon Paganini Angiulli, PA-C  starch (ANUSOL) 51 % suppository Place 1 suppository rectally as needed for pain. 12/19/12   Clayton Bibles, PA-C   BP 179/77  Pulse 101  Temp(Src) 98 F (36.7  C) (Oral)  Resp 20  SpO2 98% Physical Exam  Nursing note and vitals reviewed.  73 year old female, resting comfortably and in no acute distress. Vital signs are significant for borderline tachycardia with heart rate 101, and hypertension with blood pressure 179/77. Oxygen saturation is 98%, which is normal. Head is normocephalic and atraumatic. PERRLA, EOMI. Oropharynx is clear. Neck is nontender and supple without adenopathy or JVD. Back is nontender and there is no CVA tenderness. Lungs are clear without rales, wheezes, or rhonchi. Chest is nontender. Heart has regular rate and rhythm without murmur. Abdomen is soft, flat, nontender without masses or hepatosplenomegaly and peristalsis is  normoactive. Extremities have no cyanosis or edema, full range of motion is present. Right below-the-knee amputation is present. Skin is warm and dry without rash. Neurologic: She is awake, alert, oriented to person and place but not time, cranial nerves are intact, there are no motor or sensory deficits.  ED Course  Procedures (including critical care time) Labs Review Results for orders placed during the hospital encounter of 05/17/13  URINALYSIS, ROUTINE W REFLEX MICROSCOPIC      Result Value Ref Range   Color, Urine YELLOW  YELLOW   APPearance CLEAR  CLEAR   Specific Gravity, Urine 1.008  1.005 - 1.030   pH 5.5  5.0 - 8.0   Glucose, UA NEGATIVE  NEGATIVE mg/dL   Hgb urine dipstick NEGATIVE  NEGATIVE   Bilirubin Urine NEGATIVE  NEGATIVE   Ketones, ur NEGATIVE  NEGATIVE mg/dL   Protein, ur NEGATIVE  NEGATIVE mg/dL   Urobilinogen, UA 0.2  0.0 - 1.0 mg/dL   Nitrite NEGATIVE  NEGATIVE   Leukocytes, UA NEGATIVE  NEGATIVE  COMPREHENSIVE METABOLIC PANEL      Result Value Ref Range   Sodium 138  137 - 147 mEq/L   Potassium 3.5 (*) 3.7 - 5.3 mEq/L   Chloride 98  96 - 112 mEq/L   CO2 27  19 - 32 mEq/L   Glucose, Bld 161 (*) 70 - 99 mg/dL   BUN 18  6 - 23 mg/dL   Creatinine, Ser 0.84  0.50 - 1.10 mg/dL   Calcium 10.2  8.4 - 10.5 mg/dL   Total Protein 7.7  6.0 - 8.3 g/dL   Albumin 3.4 (*) 3.5 - 5.2 g/dL   AST 13  0 - 37 U/L   ALT 9  0 - 35 U/L   Alkaline Phosphatase 87  39 - 117 U/L   Total Bilirubin 0.5  0.3 - 1.2 mg/dL   GFR calc non Af Amer 67 (*) >90 mL/min   GFR calc Af Amer 78 (*) >90 mL/min  CBC      Result Value Ref Range   WBC 11.0 (*) 4.0 - 10.5 K/uL   RBC 4.75  3.87 - 5.11 MIL/uL   Hemoglobin 12.8  12.0 - 15.0 g/dL   HCT 38.1  36.0 - 46.0 %   MCV 80.2  78.0 - 100.0 fL   MCH 26.9  26.0 - 34.0 pg   MCHC 33.6  30.0 - 36.0 g/dL   RDW 14.5  11.5 - 15.5 %   Platelets 279  150 - 400 K/uL  DIFFERENTIAL      Result Value Ref Range   Neutrophils Relative % 68  43 - 77 %    Neutro Abs 6.5  1.7 - 7.7 K/uL   Lymphocytes Relative 20  12 - 46 %   Lymphs Abs 2.0  0.7 - 4.0 K/uL  Monocytes Relative 11  3 - 12 %   Monocytes Absolute 1.0  0.1 - 1.0 K/uL   Eosinophils Relative 1  0 - 5 %   Eosinophils Absolute 0.1  0.0 - 0.7 K/uL   Basophils Relative 0  0 - 1 %   Basophils Absolute 0.0  0.0 - 0.1 K/uL  CBG MONITORING, ED      Result Value Ref Range   Glucose-Capillary 149 (*) 70 - 99 mg/dL   Imaging Review Ct Head Wo Contrast  05/17/2013   CLINICAL DATA:  Acute onset of confusion for 4 hr.  EXAM: CT HEAD WITHOUT CONTRAST  TECHNIQUE: Contiguous axial images were obtained from the base of the skull through the vertex without intravenous contrast.  COMPARISON:  CT HEAD W/O CM dated 11/27/2010  FINDINGS: Diffuse cerebral atrophy. Mild ventricular dilatation consistent with central atrophy. Low-attenuation changes in the deep white matter consistent with small vessel ischemia. No mass effect or midline shift. No abnormal extra-axial fluid collections. Gray-white matter junctions are distinct. Basal cisterns are not effaced. No evidence of acute intracranial hemorrhage. No depressed skull fractures. Visualized paranasal sinuses and mastoid air cells are not opacified.  IMPRESSION: No acute intracranial abnormalities. Chronic atrophy and small vessel ischemic change.   Electronically Signed   By: Lucienne Capers M.D.   On: 05/17/2013 04:22   Dg Chest Port 1 View  05/17/2013   CLINICAL DATA:  Confusion.  Altered mental status.  Recent UTI.  EXAM: PORTABLE CHEST - 1 VIEW  COMPARISON:  DG CHEST 1V PORT dated 02/18/2013  FINDINGS: Shallow inspiration. The heart size and mediastinal contours are within normal limits. Both lungs are clear. The visualized skeletal structures are unremarkable.  IMPRESSION: No active disease.   Electronically Signed   By: Lucienne Capers M.D.   On: 05/17/2013 02:33     EKG Interpretation   Date/Time:  Wednesday May 17 2013 02:42:13  EDT Ventricular Rate:  97 PR Interval:    QRS Duration: 77 QT Interval:  472 QTC Calculation: 600 R Axis:   22 Text Interpretation:  Sinus rhythm Prolonged QT interval Artifact in  lead(s) I III aVR aVL aVF and baseline wander in lead(s) I When compared  with ECG of 03/01/2012, QT has lengthened Confirmed by Roxanne Mins  MD, Ravis Herne  ([id number]) on 05/17/2013 3:55:20 AM      MDM   Final diagnoses:  Altered mental status  Hypokalemia    Confusion which certainly may be due to occult urinary tract infection. WBC is mildly elevated but remainder of lab work is not remarkable. Urinalysis is pending. Chest x-ray will be ordered. Old records are reviewed and she had been hospitalized in January with encephalopathy secondary to urinary tract infection.  Urinalysis is come back without evidence of infection. She'll be sent for CT of head.  CT of head is unremarkable. Case is discussed with Dr. Posey Pronto [facility] who agrees to admit the patient under observation status.  Delora Fuel, MD 89/16/94 [id number]

## 2013-05-17 NOTE — H&P (Addendum)
[facility] History and Physical  Patient: Judith [name]  OEU:[id number]  DOB: 13-Mar-1940  DOS: the patient was seen and examined on 05/17/2013 PCP: Philis Fendt, MD  Chief Complaint: Altered mental status  HPI: Judith [name] is a 73 y.o. female with Past medical history of hypertension, diabetes, dyslipidemia, right foot BKA, hemorrhoids. The patient was brought in by her daughter. The history was obtained from her daughter. Patient lives in an assisted living facility with her granddaughter. As per the daughter last night the patient was confused and was talking about her dead family, the patient also told that somebody had called her to tell her that her mother was dead. As per her daughter the patient at her baseline does not talk this. There was no fall no trauma reported. There was no fever no chills no chest pain no shortness of breath no dizziness no blurring of the vision no abdominal pain no diarrhea no constipation. Patient is incontinence of urine at her baseline and wears depends. Patient denies any burning urination. Recently her hemorrhoids have been causing pain on defecation, which is getting better with the suppository. She had one episode of vomiting 2 days ago no nausea at present. No recent change in her medications. No workup for delirium or dementia  The patient is coming from home. And at her baseline dependent for most of her  ADL.  Review of Systems: as mentioned in the history of present illness.  A Comprehensive review of the other systems is negative.  Past Medical History  Diagnosis Date  . Hypertension   . Insulin dependent diabetes mellitus 03/01/2012  . 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 Closed right ankle fracture   . Hyperlipidemia   . Fracture of femur, distal, right, closed 03/01/2012  . Obesity (BMI 30-39.9) 03/03/2012  . Hemorrhoid    Past Surgical History  Procedure Laterality Date  . Below knee leg amputation      Right  . Orif  femur fracture  03/01/2012    Procedure: OPEN REDUCTION INTERNAL FIXATION (ORIF) DISTAL FEMUR FRACTURE;  Surgeon: Rozanna Box, MD;  Location: [facility];  Service: Orthopedics;  Laterality: Right;  ORIF right femur  . Orif femur fracture  03/03/2012    Dr Marcelino Scot   right leg  . Hemroid surgery     Social History:  reports that she has quit smoking. She has never used smokeless tobacco. She reports that she does not drink alcohol or use illicit drugs.  No Known Allergies  History reviewed. No pertinent family history.  Prior to Admission medications   Medication Sig Start Date End Date Taking? Authorizing Provider  aspirin EC 81 MG tablet Take 81 mg by mouth daily.   Yes Historical Provider, MD  cloNIDine (CATAPRES) 0.1 MG tablet Take 0.5 tablets (0.05 mg total) by mouth 3 (three) times daily. 02/20/13  Yes Geradine Girt, DO  glipiZIDE (GLUCOTROL XL) 5 MG 24 hr tablet Take 1 tablet (5 mg total) by mouth daily with breakfast. 03/23/12  Yes Lavon Paganini Angiulli, PA-C  insulin detemir (LEVEMIR) 100 UNIT/ML injection Inject 20 Units into the skin daily.   Yes Historical Provider, MD  Multiple Vitamin (MULTIVITAMIN WITH MINERALS) TABS Take 1 tablet by mouth daily. 03/23/12  Yes Daniel J Angiulli, PA-C  simvastatin (ZOCOR) 40 MG tablet Take 1 tablet (40 mg total) by mouth every evening. 03/23/12  Yes Daniel J Angiulli, PA-C  starch (ANUSOL) 51 % suppository Place 1 suppository rectally  as needed for pain. 12/19/12  Yes Clayton Bibles, PA-C    Physical Exam: Filed Vitals:   05/17/13 0400 05/17/13 0500 05/17/13 0615 05/17/13 0655  BP: 161/64 164/67  137/83  Pulse: 96 94 98 97  Temp:    97.8 F (36.6 C)  TempSrc:    Oral  Resp: 23 20 29 18   Weight:    80.3 kg (177 lb 0.5 oz)  SpO2: 99% 100% 99% 94%    General: Alert, Awake and Oriented to Time, Place and Person. Appear in mild distress Eyes: PERRL ENT: Oral Mucosa clear moist, geographic tongue. Neck: No JVD Cardiovascular: S1 and S2 Present, no  Murmur, Peripheral Pulses Present Respiratory: Bilateral Air entry equal and Decreased, Clear to Auscultation,  No Crackles, no wheezes Abdomen: Bowel Sound Present, Soft and Non tender Skin: No Rash Extremities: Right BKA, trace Pedal edema, no calf tenderness Neurologic: Grossly Unremarkable. Mental status AAOx3, speech normal, attention normal, Cranial Nerves PERRL, EOM normal and present, facial sensation to light touch present: , Motor strength bilateral equal strength 5/5, Sensation present to light touch, reflexes present biceps, Cerebellar test normal finger nose finger.   Labs on Admission:  CBC:  Recent Labs Lab 05/17/13 0104 05/17/13 0115  WBC 11.0*  --   NEUTROABS  --  6.5  HGB 12.8  --   HCT 38.1  --   MCV 80.2  --   PLT 279  --     CMP     Component Value Date/Time   NA 138 05/17/2013 0104   K 3.5* 05/17/2013 0104   CL 98 05/17/2013 0104   CO2 27 05/17/2013 0104   GLUCOSE 161* 05/17/2013 0104   BUN 18 05/17/2013 0104   CREATININE 0.84 05/17/2013 0104   CALCIUM 10.2 05/17/2013 0104   PROT 7.7 05/17/2013 0104   ALBUMIN 3.4* 05/17/2013 0104   AST 13 05/17/2013 0104   ALT 9 05/17/2013 0104   ALKPHOS 87 05/17/2013 0104   BILITOT 0.5 05/17/2013 0104   GFRNONAA 67* 05/17/2013 0104   GFRAA 78* 05/17/2013 0104    No results found for this basename: LIPASE, AMYLASE,  in the last 168 hours No results found for this basename: AMMONIA,  in the last 168 hours  No results found for this basename: CKTOTAL, CKMB, CKMBINDEX, TROPONINI,  in the last 168 hours BNP (last 3 results) No results found for this basename: PROBNP,  in the last 8760 hours  Radiological Exams on Admission: Ct Head Wo Contrast  05/17/2013   CLINICAL DATA:  Acute onset of confusion for 4 hr.  EXAM: CT HEAD WITHOUT CONTRAST  TECHNIQUE: Contiguous axial images were obtained from the base of the skull through the vertex without intravenous contrast.  COMPARISON:  CT HEAD W/O CM dated 11/27/2010  FINDINGS: Diffuse  cerebral atrophy. Mild ventricular dilatation consistent with central atrophy. Low-attenuation changes in the deep white matter consistent with small vessel ischemia. No mass effect or midline shift. No abnormal extra-axial fluid collections. Gray-white matter junctions are distinct. Basal cisterns are not effaced. No evidence of acute intracranial hemorrhage. No depressed skull fractures. Visualized paranasal sinuses and mastoid air cells are not opacified.  IMPRESSION: No acute intracranial abnormalities. Chronic atrophy and small vessel ischemic change.   Electronically Signed   By: Lucienne Capers M.D.   On: 05/17/2013 04:22   Dg Chest Port 1 View  05/17/2013   CLINICAL DATA:  Confusion.  Altered mental status.  Recent UTI.  EXAM: PORTABLE CHEST - 1 VIEW  COMPARISON:  DG CHEST 1V PORT dated 02/18/2013  FINDINGS: Shallow inspiration. The heart size and mediastinal contours are within normal limits. Both lungs are clear. The visualized skeletal structures are unremarkable.  IMPRESSION: No active disease.   Electronically Signed   By: Lucienne Capers M.D.   On: 05/17/2013 02:33    EKG: Independently reviewed. normal sinus rhythm, nonspecific ST and T waves changes.  Assessment/Plan Principal Problem:   Altered mental status Active Problems:   Hypertension   Insulin dependent diabetes mellitus   Hx of right BKA   1. Altered mental status The patient is presenting with acute altered mental status post appendectomy her current presentation is secondary to progression of dementia versus Delirium. Her CT head is negative for any acute abnormality. Her urine does not show any evidence of UTI. Her CBC is not showing any significant abnormality as well. At present I will check secondary causes of delirium for further workup. PTOT consultation. Telemetry monitoring.  2. Hypertension Continue clonidine  3. Diabetes mellitus Continue Levemir and sliding scale  4. Hypokalemia Likely secondary to  vomiting At present replacing, no evidence of EKG abnormality, no further nausea or vomiting at present.  Consults: PTOT  DVT Prophylaxis: subcutaneous Heparin Nutrition: Cardiac and diabetic diet  Code Status: Full  Family Communication: Daughter was present at bedside, opportunity was given to ask question and all questions were answered satisfactorily at the time of interview. Disposition: Admitted to observation in telemetry unit.  Author: Berle Mull, MD [facility] Pager: [phone] 05/17/2013, 7:11 AM    If 7PM-7AM, please contact night-coverage [email or web address] Password [email or web address]

## 2013-05-17 NOTE — Progress Notes (Signed)
CSW spoke to patient's daughter in law over the phone to confirm where patient lives. Daughter in law states that patent lives in a senior apartment with her granddaughter and has [employer] aides. Patient's daughter in law states that she is fine at her apartment and if there are further questions, to call her or patient's son. CSW signing off at this time.  Jeanette Caprice, MSW, [place]

## 2013-05-17 NOTE — Evaluation (Signed)
Physical Therapy Evaluation Patient Details Name: Judith [name] MRN: [id number] DOB: Jan 20, 1941 Today's Date: 05/17/2013   History of Present Illness  Pt admit with AMS.  Clinical Impression  Pt admitted with above. Pt currently with functional limitations due to the deficits listed below (see PT Problem List).  Pt will benefit from skilled PT to increase their independence and safety with mobility to allow discharge to the venue listed below.     Follow Up Recommendations Supervision/Assistance - 24 hour;Home health PT (only if 24 hour assist.  If no 24 hour assist, recommend NHP)    Equipment Recommendations  None recommended by PT    Recommendations for Other Services       Precautions / Restrictions Precautions Precautions: Fall Restrictions Weight Bearing Restrictions: No      Mobility  Bed Mobility Overal bed mobility: Needs Assistance;+2 for physical assistance Bed Mobility: Supine to Sit     Supine to sit: Min assist     General bed mobility comments: Assist for elevation of trunk.    Transfers Overall transfer level: Needs assistance Equipment used: None Transfers: Squat Pivot Transfers     Squat pivot transfers: Mod assist;+2 physical assistance     General transfer comment: Used pad to assist with squat pivot transfer.  Pt needed assist for hip and knee extension.  Pt has difficulty with anterior lean to unweight bottom.    Ambulation/Gait                Stairs            Wheelchair Mobility    Modified Rankin (Stroke Patients Only)       Balance Overall balance assessment: Needs assistance;History of Falls Sitting-balance support: No upper extremity supported Sitting balance-Leahy Scale: Fair     Standing balance support: Bilateral upper extremity supported;During functional activity Standing balance-Leahy Scale: Zero Standing balance comment: Pt cannot stand on left LE alone.                                Pertinent Vitals/Pain VSS, No pain    Home Living Family/patient expects to be discharged to:: Private residence Living Arrangements: Children;Parent;Other relatives ("mother is 21 yr old & lives in my house") Available Help at Discharge: Family;Available 24 hours/day Type of Home: House Home Access: Ramped entrance     Home Layout: One level Home Equipment: Walker - 2 wheels;Bedside commode;Wheelchair - Education administrator (comment)      Prior Function Level of Independence: Needs assistance   Gait / Transfers Assistance Needed: pt can do most slide board transfers with supervision for safety  ADL's / Homemaking Assistance Needed: Total assist needed for housekeeping.  ADLs not assessed        Hand Dominance   Dominant Hand: Right    Extremity/Trunk Assessment   Upper Extremity Assessment: Defer to OT evaluation           Lower Extremity Assessment: RLE deficits/detail;LLE deficits/detail RLE Deficits / Details: grossly 2-/5 LLE Deficits / Details: grossly 2/5     Communication   Communication: No difficulties  Cognition Arousal/Alertness: Awake/alert Behavior During Therapy: WFL for tasks assessed/performed Overall Cognitive Status: History of cognitive impairments - at baseline                      General Comments General comments (skin integrity, edema, etc.): Pt states she has prosthesis however pt confused.  Unsure if accurate information.  Exercises General Exercises - Lower Extremity Ankle Circles/Pumps: AROM;Left;5 reps;Supine Long Arc Quad: AROM;10 reps;Seated;Both      Assessment/Plan    PT Assessment Patient needs continued PT services  PT Diagnosis Generalized weakness   PT Problem List Decreased activity tolerance;Decreased balance;Decreased mobility;Decreased knowledge of use of DME;Decreased safety awareness;Decreased knowledge of precautions;Decreased strength  PT Treatment Interventions DME instruction;Gait  training;Functional mobility training;Therapeutic activities;Therapeutic exercise;Balance training;Patient/family education   PT Goals (Current goals can be found in the Care Plan section) Acute Rehab PT Goals Patient Stated Goal: unable to assess as pt confused PT Goal Formulation: Patient unable to participate in goal setting Time For Goal Achievement: 05/24/13 Potential to Achieve Goals: Fair    Frequency Min 3X/week   Barriers to discharge        Co-evaluation               End of Session Equipment Utilized During Treatment: Gait belt Activity Tolerance: Patient limited by fatigue Patient left: in chair;with call bell/phone within reach;with chair alarm set Nurse Communication: Mobility status    Functional Assessment Tool Used: clinical judgement Functional Limitation: Mobility: Walking and moving around Mobility: Walking and Moving Around Current Status [phone]): At least 40 percent but less than 60 percent impaired, limited or restricted Mobility: Walking and Moving Around Goal Status [phone]): At least 1 percent but less than 20 percent impaired, limited or restricted    Time: 1040-1055 PT Time Calculation (min): 15 min   Charges:   PT Evaluation $Initial PT Evaluation Tier I: 1 Procedure PT Treatments $Therapeutic Activity: 8-22 mins   PT G Codes:   Functional Assessment Tool Used: clinical judgement Functional Limitation: Mobility: Walking and moving around    [facility] 05/17/2013, 12:39 PM [facility] Acute Rehabilitation [phone] (pager)

## 2013-05-18 DIAGNOSIS — E162 Hypoglycemia, unspecified: Secondary | ICD-10-CM

## 2013-05-18 LAB — BASIC METABOLIC PANEL
BUN: 20 mg/dL (ref 6–23)
CALCIUM: 9.9 mg/dL (ref 8.4–10.5)
CHLORIDE: 104 meq/L (ref 96–112)
CO2: 26 mEq/L (ref 19–32)
CREATININE: 0.86 mg/dL (ref 0.50–1.10)
GFR calc Af Amer: 76 mL/min — ABNORMAL LOW (ref >90)
GFR calc non Af Amer: 65 mL/min — ABNORMAL LOW (ref >90)
GLUCOSE: 39 mg/dL — AB (ref 70–99)
Potassium: 3.9 mEq/L (ref 3.7–5.3)
Sodium: 144 mEq/L (ref 137–147)

## 2013-05-18 LAB — GLUCOSE, CAPILLARY
Glucose-Capillary: 107 mg/dL — ABNORMAL HIGH (ref 70–99)
Glucose-Capillary: 38 mg/dL — CL (ref 70–99)
Glucose-Capillary: 77 mg/dL (ref 70–99)
Glucose-Capillary: 145 mg/dL — ABNORMAL HIGH (ref 70–99)
Glucose-Capillary: 206 mg/dL — ABNORMAL HIGH (ref 70–99)
Glucose-Capillary: 69 mg/dL — ABNORMAL LOW (ref 70–99)

## 2013-05-18 LAB — CBC
HEMATOCRIT: 36.1 % (ref 36.0–46.0)
HEMOGLOBIN: 11.7 g/dL — AB (ref 12.0–15.0)
MCH: 26.4 pg (ref 26.0–34.0)
MCHC: 32.4 g/dL (ref 30.0–36.0)
MCV: 81.3 fL (ref 78.0–100.0)
Platelets: 310 10*3/uL (ref 150–400)
RBC: 4.44 MIL/uL (ref 3.87–5.11)
RDW: 14.8 % (ref 11.5–15.5)
WBC: 9.3 10*3/uL (ref 4.0–10.5)

## 2013-05-18 MED ORDER — DEXTROSE 50 % IV SOLN
25.0000 mL | Freq: Once | INTRAVENOUS | Status: AC | PRN
  Administered 2013-05-18: 25 mL via INTRAVENOUS

## 2013-05-18 MED ORDER — INSULIN DETEMIR 100 UNIT/ML ~~LOC~~ SOLN
12.0000 [IU] | Freq: Every day | SUBCUTANEOUS | Status: DC
  Administered 2013-05-18 – 2013-05-19 (×2): 12 [IU] via SUBCUTANEOUS
  Filled 2013-05-18 (×2): qty 0.12

## 2013-05-18 MED ORDER — DEXTROSE 50 % IV SOLN: 25.0000 mL | Freq: Once | INTRAVENOUS | Status: AC | PRN

## 2013-05-18 NOTE — Progress Notes (Signed)
  05/18/13 Noted Levemir discontinued due to hypoglycemia this am.  Pt may need only half his home dose 10 units Levemir while here if fasting tomorrow is elevated. Thank you, Rosita Kea, RN, CNS, Diabetes Coordinator [phone])

## 2013-05-18 NOTE — Progress Notes (Signed)
Physical Therapy Treatment Patient Details Name: Judith [name] MRN: [id number] DOB: 11-07-1940 Today's Date: 2013-06-11    History of Present Illness Pt admit with AMS.    PT Comments    Pt admitted with above. Pt currently with functional limitations due to balance and endurance deficits.  Pt will benefit from skilled PT to increase their independence and safety with mobility to allow discharge to the venue listed below.   Follow Up Recommendations  Supervision/Assistance - 24 hour;Home health PT (only if 24 hour assist.  If no 24 hour assist, recommend NHP)     Equipment Recommendations  None recommended by PT    Recommendations for Other Services       Precautions / Restrictions Precautions Precautions: Fall Restrictions Weight Bearing Restrictions: No    Mobility  Bed Mobility Overal bed mobility: Needs Assistance;+2 for physical assistance Bed Mobility: Supine to Sit     Supine to sit: Min assist     General bed mobility comments: Assist for elevation of trunk.    Transfers Overall transfer level: Needs assistance Equipment used: None Transfers: Comptroller transfers: Min guard   General transfer comment: Used pad to assist with anterior posterior transfer as unit did not have a wheelchair to get pt in.  Pt needed very little assist with pad and did most of this transfer without assist by PT.    Ambulation/Gait                 Stairs            Wheelchair Mobility    Modified Rankin (Stroke Patients Only)       Balance Overall balance assessment: Needs assistance;History of Falls Sitting-balance support: No upper extremity supported;Feet supported Sitting balance-Leahy Scale: Fair                              Cognition Arousal/Alertness: Awake/alert Behavior During Therapy: WFL for tasks assessed/performed Overall Cognitive Status: History of cognitive impairments - at  baseline                      Exercises General Exercises - Lower Extremity Ankle Circles/Pumps: AROM;Left;5 reps;Supine Long Arc Quad: AROM;10 reps;Seated;Both Hip Flexion/Marching: AROM;Both;10 reps;Seated    General Comments        Pertinent Vitals/Pain VSS, no pain    Home Living                      Prior Function            PT Goals (current goals can now be found in the care plan section) Progress towards PT goals: Progressing toward goals    Frequency  Min 3X/week    PT Plan Current plan remains appropriate    Co-evaluation             End of Session Equipment Utilized During Treatment: Gait belt Activity Tolerance: Patient limited by fatigue Patient left: in chair;with call bell/phone within reach;with chair alarm set     Time: 1245-8099 PT Time Calculation (min): 25 min  Charges:  $Therapeutic Exercise: 8-22 mins $Therapeutic Activity: 8-22 mins                    G Codes:      Christianne Dolin 2013/06/11, 11:28 AM [facility] [phone] (pager)

## 2013-05-18 NOTE — Care Management Note (Signed)
    Page 1 of 1   05/19/2013     4:07:54 PM CARE MANAGEMENT NOTE 05/19/2013  Patient:  Judith [name], Judith [name]   Account Number:  [id number]  Date Initiated:  05/18/2013  Documentation initiated by:  Judith [name]  Subjective/Objective Assessment:   Pt adm on 05/17/13 with mental status changes.  PTA, pt [facility] with her granddaughter.     Action/Plan:   P.T. recommending HH follow up with 24hr supervision at dc. Will confirm dc plans with family.   Anticipated DC Date:  05/19/2013   Anticipated DC Plan:  [place]  CM consult      [facility] Choice  HOME HEALTH   Choice offered to / List presented to:  C-4 Adult Children        HH arranged  HH-1 RN  [place].   Status of service:  Completed, signed off Medicare Important Message given?   (If response is "NO", the following Medicare IM given date fields will be blank) Date Medicare IM given:   Date Additional Medicare IM given:    Discharge Disposition:  [place]  Per UR Regulation:  Reviewed for med. necessity/level of care/duration of stay  If discussed at [place] of Stay Meetings, dates discussed:    Comments:  05/19/13 Judith [name], BSN [phone] Pt for dc home today with family.  Spoke with daughter Judith [name] by phone, to discuss [facility] arrangements.  Referral to [facility], per daughter's choice.  Daughter asks that [facility] agency call her cell phone to arrange appts, as pt gets confused at times.  Judith [name] (daughter) cell # [phone]

## 2013-05-18 NOTE — Progress Notes (Signed)
Hypoglycemic Event  CBG:  40  Treatment: D50 IV 25 mL  Symptoms: Sweaty  Follow-up CBG: [id number] CBG Result:107  Possible Reasons for Event: unknown  Comments/MD notified:Hypoglgemic protocol    Judith [name]  Remember to initiate Hypoglycemia Order Set & complete

## 2013-05-18 NOTE — Progress Notes (Signed)
[facility] PROGRESS NOTE  Judith [name] KWI:[id number] DOB: 1940/07/06 DOA: 05/17/2013 PCP: Philis Fendt, MD  Assessment/Plan: 1. Dementia.  -Present mental status changes could be secondary to progression of underlying dementia. It is possible hypoglycemia may be contributing  -Workup did not reveal an obvious source of infection as a urine analysis and chest x-ray were negative.  -Labs did show a mild leukocytosis with white count of 11,000.  -Appears toxic and nonfocal.  2. Hypoglycemia -Patient becoming hypoglycemic overnight, having a blood sugar of 39. She was placed on hypoglycemia protocol -Her insulin detemir was decreased from 20 units subcutaneous daily to 12 units subcutaneous daily -We'll continue close monitoring  3. Hypertension. -Blood pressure stable, with most recent blood pressure reading of 126/64. -Continue clonidine 0.1 mg by mouth 3 times a day  4. Insulin-dependent type 2 diabetes mellitus -Insulin dose decreased to 12 units of Detemir daily from 20 units due to development of hypoglycemia  5. Dyslipidemia -Continue statin therapy  Code Status: Full code Family Communication: Attempted to call family members on cell phone, left message Disposition Plan: 24-hour observation   Consultants:  Physical therapy   HPI/Subjective: Patient is a pleasant 73 year old female with a past medical history of cognitive impairment, admitted to the medicine service on 05/17/2013 presenting with mental status changes. She is currently a resident at an assisted living facility. She was noted to be increased but confused, disoriented and talking to family members who have passed away. Initial workup included CT scan of brain which did not show acute intracranial abnormalities. Workup so far has included a urinalysis and chest x-ray which came back negative.  Overnight patient having episode of hypoglycemia with BS of 39, was given dextrose. This morning she reports  feeling fine, tolerating PO intake.   Objective: Filed Vitals:   05/18/13 1046  BP: 126/64  Pulse:   Temp:   Resp:     Intake/Output Summary (Last 24 hours) at 05/18/13 1347 Last data filed at 05/18/13 1134  Gross per 24 hour  Intake    480 ml  Output      0 ml  Net    480 ml   Filed Weights   05/17/13 0655 05/18/13 0406  Weight: 80.3 kg (177 lb 0.5 oz) 78.7 kg (173 lb 8 oz)    Exam:   General:  Patient is pleasant, awake alert, no acute distress. She is mildly confused  Cardiovascular: Regular rate rhythm normal S1-S2  Respiratory: Clear to auscultation bilaterally  Abdomen: Soft nontender nondistended  Musculoskeletal: No edema  Data Reviewed: Basic Metabolic Panel:  Recent Labs Lab 05/17/13 0104 05/18/13 0340  NA 138 144  K 3.5* 3.9  CL 98 104  CO2 27 26  GLUCOSE 161* 39*  BUN 18 20  CREATININE 0.84 0.86  CALCIUM 10.2 9.9   Liver Function Tests:  Recent Labs Lab 05/17/13 0104  AST 13  ALT 9  ALKPHOS 87  BILITOT 0.5  PROT 7.7  ALBUMIN 3.4*   No results found for this basename: LIPASE, AMYLASE,  in the last 168 hours  Recent Labs Lab 05/17/13 0916  AMMONIA 10*   CBC:  Recent Labs Lab 05/17/13 0104 05/17/13 0115 05/18/13 0340  WBC 11.0*  --  9.3  NEUTROABS  --  6.5  --   HGB 12.8  --  11.7*  HCT 38.1  --  36.1  MCV 80.2  --  81.3  PLT 279  --  310   Cardiac Enzymes: No results  found for this basename: CKTOTAL, CKMB, CKMBINDEX, TROPONINI,  in the last 168 hours BNP (last 3 results) No results found for this basename: PROBNP,  in the last 8760 hours CBG:  Recent Labs Lab 05/17/13 2135 05/17/13 2153 05/18/13 0343 05/18/13 0412 05/18/13 0619  GLUCAP 40* 141* 38* 107* 77    No results found for this or any previous visit (from the past 240 hour(s)).   Studies: Ct Head Wo Contrast  05/17/2013   CLINICAL DATA:  Acute onset of confusion for 4 hr.  EXAM: CT HEAD WITHOUT CONTRAST  TECHNIQUE: Contiguous axial images were  obtained from the base of the skull through the vertex without intravenous contrast.  COMPARISON:  CT HEAD W/O CM dated 11/27/2010  FINDINGS: Diffuse cerebral atrophy. Mild ventricular dilatation consistent with central atrophy. Low-attenuation changes in the deep white matter consistent with small vessel ischemia. No mass effect or midline shift. No abnormal extra-axial fluid collections. Gray-white matter junctions are distinct. Basal cisterns are not effaced. No evidence of acute intracranial hemorrhage. No depressed skull fractures. Visualized paranasal sinuses and mastoid air cells are not opacified.  IMPRESSION: No acute intracranial abnormalities. Chronic atrophy and small vessel ischemic change.   Electronically Signed   By: Lucienne Capers M.D.   On: 05/17/2013 04:22   Dg Chest Port 1 View  05/17/2013   CLINICAL DATA:  Confusion.  Altered mental status.  Recent UTI.  EXAM: PORTABLE CHEST - 1 VIEW  COMPARISON:  DG CHEST 1V PORT dated 02/18/2013  FINDINGS: Shallow inspiration. The heart size and mediastinal contours are within normal limits. Both lungs are clear. The visualized skeletal structures are unremarkable.  IMPRESSION: No active disease.   Electronically Signed   By: Lucienne Capers M.D.   On: 05/17/2013 02:33    Scheduled Meds: . aspirin EC  81 mg Oral Daily  . cloNIDine  0.05 mg Oral TID  . enoxaparin (LOVENOX) injection  40 mg Subcutaneous Q24H  . insulin aspart  0-5 Units Subcutaneous QHS  . insulin aspart  0-9 Units Subcutaneous TID WC  . insulin detemir  12 Units Subcutaneous Daily  . simvastatin  40 mg Oral QPM  . sodium chloride  3 mL Intravenous Q12H   Continuous Infusions:   Principal Problem:   Altered mental status Active Problems:   Hypertension   Insulin dependent diabetes mellitus   Hx of right BKA   Altered mental state    Time spent: 35 min    [facility] Pager [phone]. If 7PM-7AM, please contact night-coverage at  [email or web address], password [facility] 05/18/2013, 1:47 PM  LOS: 1 day

## 2013-05-19 DIAGNOSIS — F05 Delirium due to known physiological condition: Secondary | ICD-10-CM

## 2013-05-19 LAB — GLUCOSE, CAPILLARY
GLUCOSE-CAPILLARY: 126 mg/dL — AB (ref 70–99)
Glucose-Capillary: 103 mg/dL — ABNORMAL HIGH (ref 70–99)
Glucose-Capillary: 123 mg/dL — ABNORMAL HIGH (ref 70–99)
Glucose-Capillary: 128 mg/dL — ABNORMAL HIGH (ref 70–99)
Glucose-Capillary: 274 mg/dL — ABNORMAL HIGH (ref 70–99)

## 2013-05-19 MED ORDER — INSULIN DETEMIR 100 UNIT/ML ~~LOC~~ SOLN: 12.0000 [IU] | Freq: Every day | SUBCUTANEOUS | Status: DC

## 2013-05-19 NOTE — Discharge Summary (Signed)
Physician Discharge Summary  Judith [name] GYI:[id number] DOB: Apr 04, 1940 DOA: 05/17/2013  PCP: Philis Fendt, MD  Admit date: 05/17/2013 Discharge date: 05/19/2013  Time spent: 35 minutes  Recommendations for Outpatient Follow-up:  1. Please follow up on Blood sugars, she became hypoglycemic during this hospitalization for which I decreased her insulin detemir to 12 units [place] daily from 20 units  Discharge Diagnoses:  Principal Problem:   Altered mental status Active Problems:   Hypertension   Insulin dependent diabetes mellitus   Hx of right BKA   Altered mental state   Discharge Condition:Stable/Improved  Diet recommendation: Carb Modified Diet  Filed Weights   05/17/13 0655 05/18/13 0406 05/19/13 0352  Weight: 80.3 kg (177 lb 0.5 oz) 78.7 kg (173 lb 8 oz) 80.4 kg (177 lb 4 oz)    History of present illness:  Judith [name] is a 73 y.o. female with Past medical history of hypertension, diabetes, dyslipidemia, right foot BKA, hemorrhoids.  The patient was brought in by her daughter. The history was obtained from her daughter. Patient lives in an assisted living facility with her granddaughter. As per the daughter last night the patient was confused and was talking about her dead family, the patient also told that somebody had called her to tell her that her mother was dead. As per her daughter the patient at her baseline does not talk this.  There was no fall no trauma reported. There was no fever no chills no chest pain no shortness of breath no dizziness no blurring of the vision no abdominal pain no diarrhea no constipation.  Patient is incontinence of urine at her baseline and wears depends.  Patient denies any burning urination.  Recently her hemorrhoids have been causing pain on defecation, which is getting better with the suppository.  She had one episode of vomiting 2 days ago no nausea at present.  No recent change in her medications.  No workup for delirium or  dementia   Hospital Course:  Patient is a pleasant 73 year old female with a past medical history of cognitive impairment in minutes medicine service on 05/17/2013 presenting with mental status changes. She has a history of type diabetes mellitus on subcutaneous insulin daily. Workup did not reveal an obvious source of infection as a chest x-ray was clear, urinalysis unremarkable. During this hospitalization she remained afebrile, nontoxic. Hospitalization was complicated by the development of hypoglycemia as she had a blood sugar of 39 on 05/18/2013. Her insulin detemir was decreased from 20 units and continue daily to 12 units and continue daily. Blood sugars are asked remained improved. It is unclear what may have caused mental status changes, possibilities to include hypoglycemia although family members reported blood sugars in the 150s at home. This could be secondary to progression of underlying dementia. Given the patient's stability she was discharged in stable condition on 05/19/2013 to her home. Prior to discharge I spoke with patient's daughter. Home services were set up for discharge as well for patient to receive home PT and RN. Blood sugars will need to be followed up.     Discharge Exam: Filed Vitals:   05/19/13 0328  BP: 120/44  Pulse: 69  Temp: 98 F (36.7 C)  Resp: 18    General: No acute distress, awake alert pleasant Cardiovascular: Regular rate rhythm normal S1-S2 Respiratory: Clear to auscultation bilaterally Abdomen: Soft nontender nondistended Extremity: Status post right BKA  Discharge Instructions You were cared for by a hospitalist during your hospital stay. If you have  any questions about your discharge medications or the care you received while you were in the hospital after you are discharged, you can call the unit and asked to speak with the hospitalist on call if the hospitalist that took care of you is not available. Once you are discharged, your primary care  physician will handle any further medical issues. Please note that NO REFILLS for any discharge medications will be authorized once you are discharged, as it is imperative that you return to your primary care physician (or establish a relationship with a primary care physician if you do not have one) for your aftercare needs so that they can reassess your need for medications and monitor your lab values.  Discharge Orders   Future Orders Complete By Expires   Call MD for:  difficulty breathing, headache or visual disturbances  As directed    Call MD for:  extreme fatigue  As directed    Call MD for:  persistant dizziness or light-headedness  As directed    Call MD for:  persistant nausea and vomiting  As directed    Call MD for:  severe uncontrolled pain  As directed    Call MD for:  temperature >100.4  As directed    Diet - low sodium heart healthy  As directed    Increase activity slowly  As directed        Medication List    STOP taking these medications       glipiZIDE 5 MG 24 hr tablet  Commonly known as:  GLUCOTROL XL      TAKE these medications       aspirin EC 81 MG tablet  Take 81 mg by mouth daily.     cloNIDine 0.1 MG tablet  Commonly known as:  CATAPRES  Take 0.5 tablets (0.05 mg total) by mouth 3 (three) times daily.     insulin detemir 100 UNIT/ML injection  Commonly known as:  LEVEMIR  Inject 0.12 mLs (12 Units total) into the skin daily.     multivitamin with minerals Tabs tablet  Take 1 tablet by mouth daily.     simvastatin 40 MG tablet  Commonly known as:  ZOCOR  Take 1 tablet (40 mg total) by mouth every evening.     starch 51 % suppository  Commonly known as:  ANUSOL  Place 1 suppository rectally as needed for pain.       No Known Allergies     Follow-up Information   Follow up with AVBUERE,EDWIN A, MD In 1 week.   Specialty:  Internal Medicine   Contact information:   [address] [address] [phone]        The  results of significant diagnostics from this hospitalization (including imaging, microbiology, ancillary and laboratory) are listed below for reference.    Significant Diagnostic Studies: Ct Head Wo Contrast  05/17/2013   CLINICAL DATA:  Acute onset of confusion for 4 hr.  EXAM: CT HEAD WITHOUT CONTRAST  TECHNIQUE: Contiguous axial images were obtained from the base of the skull through the vertex without intravenous contrast.  COMPARISON:  CT HEAD W/O CM dated 11/27/2010  FINDINGS: Diffuse cerebral atrophy. Mild ventricular dilatation consistent with central atrophy. Low-attenuation changes in the deep white matter consistent with small vessel ischemia. No mass effect or midline shift. No abnormal extra-axial fluid collections. Gray-white matter junctions are distinct. Basal cisterns are not effaced. No evidence of acute intracranial hemorrhage. No depressed skull fractures. Visualized paranasal sinuses and  mastoid air cells are not opacified.  IMPRESSION: No acute intracranial abnormalities. Chronic atrophy and small vessel ischemic change.   Electronically Signed   By: Lucienne Capers M.D.   On: 05/17/2013 04:22   Dg Chest Port 1 View  05/17/2013   CLINICAL DATA:  Confusion.  Altered mental status.  Recent UTI.  EXAM: PORTABLE CHEST - 1 VIEW  COMPARISON:  DG CHEST 1V PORT dated 02/18/2013  FINDINGS: Shallow inspiration. The heart size and mediastinal contours are within normal limits. Both lungs are clear. The visualized skeletal structures are unremarkable.  IMPRESSION: No active disease.   Electronically Signed   By: Lucienne Capers M.D.   On: 05/17/2013 02:33    Microbiology: No results found for this or any previous visit (from the past 240 hour(s)).   Labs: Basic Metabolic Panel:  Recent Labs Lab 05/17/13 0104 05/18/13 0340  NA 138 144  K 3.5* 3.9  CL 98 104  CO2 27 26  GLUCOSE 161* 39*  BUN 18 20  CREATININE 0.84 0.86  CALCIUM 10.2 9.9   Liver Function Tests:  Recent Labs Lab  05/17/13 0104  AST 13  ALT 9  ALKPHOS 87  BILITOT 0.5  PROT 7.7  ALBUMIN 3.4*   No results found for this basename: LIPASE, AMYLASE,  in the last 168 hours  Recent Labs Lab 05/17/13 0916  AMMONIA 10*   CBC:  Recent Labs Lab 05/17/13 0104 05/17/13 0115 05/18/13 0340  WBC 11.0*  --  9.3  NEUTROABS  --  6.5  --   HGB 12.8  --  11.7*  HCT 38.1  --  36.1  MCV 80.2  --  81.3  PLT 279  --  310   Cardiac Enzymes: No results found for this basename: CKTOTAL, CKMB, CKMBINDEX, TROPONINI,  in the last 168 hours BNP: BNP (last 3 results) No results found for this basename: PROBNP,  in the last 8760 hours CBG:  Recent Labs Lab 05/18/13 2021 05/18/13 2256 05/19/13 0044 05/19/13 0628 05/19/13 1126  GLUCAP 69* 123* 128* 103* 274*       Signed:  [facility] 05/19/2013, 11:47 AM

## 2013-08-30 ENCOUNTER — Encounter: Payer: Self-pay | Admitting: Gastroenterology

## 2013-09-06 ENCOUNTER — Encounter: Payer: Self-pay | Admitting: *Deleted

## 2013-09-19 ENCOUNTER — Ambulatory Visit: Payer: Medicare Other | Admitting: Internal Medicine

## 2013-09-21 ENCOUNTER — Inpatient Hospital Stay (HOSPITAL_COMMUNITY): Payer: Medicare Other

## 2013-09-21 ENCOUNTER — Ambulatory Visit (INDEPENDENT_AMBULATORY_CARE_PROVIDER_SITE_OTHER): Payer: Medicare Other | Admitting: Physician Assistant

## 2013-09-21 ENCOUNTER — Observation Stay (HOSPITAL_COMMUNITY)
Admission: AD | Admit: 2013-09-21 | Discharge: 2013-09-23 | Disposition: A | Discharge status: Home / Self Care | Payer: Medicare Other | Source: Ambulatory Visit | Attending: Internal Medicine | Admitting: Internal Medicine

## 2013-09-21 ENCOUNTER — Encounter: Payer: Self-pay | Admitting: Physician Assistant

## 2013-09-21 ENCOUNTER — Encounter (HOSPITAL_COMMUNITY): Payer: Self-pay | Admitting: General Practice

## 2013-09-21 VITALS — BP 116/72 | HR 76 | Ht 63.0 in

## 2013-09-21 DIAGNOSIS — E119 Type 2 diabetes mellitus without complications: Secondary | ICD-10-CM | POA: Insufficient documentation

## 2013-09-21 DIAGNOSIS — Z79899 Other long term (current) drug therapy: Secondary | ICD-10-CM | POA: Insufficient documentation

## 2013-09-21 DIAGNOSIS — F0390 Unspecified dementia without behavioral disturbance: Secondary | ICD-10-CM | POA: Diagnosis not present

## 2013-09-21 DIAGNOSIS — K625 Hemorrhage of anus and rectum: Secondary | ICD-10-CM | POA: Diagnosis not present

## 2013-09-21 DIAGNOSIS — E43 Unspecified severe protein-calorie malnutrition: Secondary | ICD-10-CM | POA: Insufficient documentation

## 2013-09-21 DIAGNOSIS — R32 Unspecified urinary incontinence: Secondary | ICD-10-CM | POA: Insufficient documentation

## 2013-09-21 DIAGNOSIS — E639 Nutritional deficiency, unspecified: Secondary | ICD-10-CM

## 2013-09-21 DIAGNOSIS — R339 Retention of urine, unspecified: Secondary | ICD-10-CM | POA: Insufficient documentation

## 2013-09-21 DIAGNOSIS — D62 Acute posthemorrhagic anemia: Secondary | ICD-10-CM

## 2013-09-21 DIAGNOSIS — K5900 Constipation, unspecified: Secondary | ICD-10-CM

## 2013-09-21 DIAGNOSIS — K5909 Other constipation: Secondary | ICD-10-CM

## 2013-09-21 DIAGNOSIS — S88119A Complete traumatic amputation at level between knee and ankle, unspecified lower leg, initial encounter: Secondary | ICD-10-CM | POA: Diagnosis not present

## 2013-09-21 DIAGNOSIS — F0391 Unspecified dementia with behavioral disturbance: Secondary | ICD-10-CM

## 2013-09-21 DIAGNOSIS — D649 Anemia, unspecified: Secondary | ICD-10-CM | POA: Diagnosis not present

## 2013-09-21 DIAGNOSIS — Z794 Long term (current) use of insulin: Secondary | ICD-10-CM | POA: Insufficient documentation

## 2013-09-21 DIAGNOSIS — I10 Essential (primary) hypertension: Secondary | ICD-10-CM | POA: Diagnosis not present

## 2013-09-21 DIAGNOSIS — K5641 Fecal impaction: Secondary | ICD-10-CM

## 2013-09-21 DIAGNOSIS — R627 Adult failure to thrive: Secondary | ICD-10-CM

## 2013-09-21 DIAGNOSIS — K6289 Other specified diseases of anus and rectum: Secondary | ICD-10-CM

## 2013-09-21 DIAGNOSIS — Z7982 Long term (current) use of aspirin: Secondary | ICD-10-CM | POA: Diagnosis not present

## 2013-09-21 DIAGNOSIS — IMO0001 Reserved for inherently not codable concepts without codable children: Secondary | ICD-10-CM

## 2013-09-21 DIAGNOSIS — Z89511 Acquired absence of right leg below knee: Secondary | ICD-10-CM

## 2013-09-21 DIAGNOSIS — R634 Abnormal weight loss: Secondary | ICD-10-CM

## 2013-09-21 DIAGNOSIS — N390 Urinary tract infection, site not specified: Secondary | ICD-10-CM

## 2013-09-21 DIAGNOSIS — E669 Obesity, unspecified: Secondary | ICD-10-CM

## 2013-09-21 DIAGNOSIS — F05 Delirium due to known physiological condition: Secondary | ICD-10-CM

## 2013-09-21 DIAGNOSIS — E162 Hypoglycemia, unspecified: Secondary | ICD-10-CM

## 2013-09-21 LAB — GLUCOSE, CAPILLARY
GLUCOSE-CAPILLARY: 208 mg/dL — AB (ref 70–99)
GLUCOSE-CAPILLARY: 63 mg/dL — AB (ref 70–99)
GLUCOSE-CAPILLARY: 76 mg/dL (ref 70–99)
Glucose-Capillary: 46 mg/dL — ABNORMAL LOW (ref 70–99)
Glucose-Capillary: 52 mg/dL — ABNORMAL LOW (ref 70–99)

## 2013-09-21 LAB — CBC WITH DIFFERENTIAL/PLATELET
BASOS PCT: 1 % (ref 0–1)
Basophils Absolute: 0 10*3/uL (ref 0.0–0.1)
Eosinophils Absolute: 0.1 10*3/uL (ref 0.0–0.7)
Eosinophils Relative: 1 % (ref 0–5)
HCT: 34.4 % — ABNORMAL LOW (ref 36.0–46.0)
Hemoglobin: 11.2 g/dL — ABNORMAL LOW (ref 12.0–15.0)
LYMPHS PCT: 32 % (ref 12–46)
Lymphs Abs: 2 10*3/uL (ref 0.7–4.0)
MCH: 26.7 pg (ref 26.0–34.0)
MCHC: 32.6 g/dL (ref 30.0–36.0)
MCV: 82.1 fL (ref 78.0–100.0)
Monocytes Absolute: 0.5 10*3/uL (ref 0.1–1.0)
Monocytes Relative: 7 % (ref 3–12)
NEUTROS ABS: 3.8 10*3/uL (ref 1.7–7.7)
Neutrophils Relative %: 59 % (ref 43–77)
Platelets: 258 10*3/uL (ref 150–400)
RBC: 4.19 MIL/uL (ref 3.87–5.11)
RDW: 14.8 % (ref 11.5–15.5)
WBC: 6.4 10*3/uL (ref 4.0–10.5)

## 2013-09-21 LAB — COMPREHENSIVE METABOLIC PANEL
ALBUMIN: 2.8 g/dL — AB (ref 3.5–5.2)
ALT: 8 U/L (ref 0–35)
AST: 12 U/L (ref 0–37)
Alkaline Phosphatase: 69 U/L (ref 39–117)
Anion gap: 11 (ref 5–15)
BUN: 15 mg/dL (ref 6–23)
CALCIUM: 10 mg/dL (ref 8.4–10.5)
CO2: 28 meq/L (ref 19–32)
Chloride: 100 mEq/L (ref 96–112)
Creatinine, Ser: 0.82 mg/dL (ref 0.50–1.10)
GFR calc Af Amer: 80 mL/min — ABNORMAL LOW (ref >90)
GFR, EST NON AFRICAN AMERICAN: 69 mL/min — AB (ref >90)
Glucose, Bld: 231 mg/dL — ABNORMAL HIGH (ref 70–99)
Potassium: 3.8 mEq/L (ref 3.7–5.3)
SODIUM: 139 meq/L (ref 137–147)
TOTAL PROTEIN: 6.6 g/dL (ref 6.0–8.3)
Total Bilirubin: 0.3 mg/dL (ref 0.3–1.2)

## 2013-09-21 MED ORDER — ONDANSETRON HCL 4 MG/2ML IJ SOLN: 4.0000 mg | Freq: Four times a day (QID) | INTRAMUSCULAR | Status: DC | PRN

## 2013-09-21 MED ORDER — DEXTROSE 50 % IV SOLN: 25.0000 mL | Freq: Once | INTRAVENOUS | Status: DC | PRN

## 2013-09-21 MED ORDER — ACETAMINOPHEN 325 MG PO TABS: 650.0000 mg | ORAL_TABLET | Freq: Four times a day (QID) | ORAL | Status: DC | PRN

## 2013-09-21 MED ORDER — ASPIRIN EC 81 MG PO TBEC
81.0000 mg | DELAYED_RELEASE_TABLET | Freq: Every day | ORAL | Status: DC
  Administered 2013-09-21 – 2013-09-23 (×3): 81 mg via ORAL
  Filled 2013-09-21 (×3): qty 1

## 2013-09-21 MED ORDER — GLUCOSE 40 % PO GEL
1.0000 | ORAL | Status: DC | PRN
  Administered 2013-09-21 (×2): 37.5 g via ORAL
  Filled 2013-09-21: qty 1

## 2013-09-21 MED ORDER — INSULIN ASPART 100 UNIT/ML ~~LOC~~ SOLN
0.0000 [IU] | SUBCUTANEOUS | Status: DC
  Administered 2013-09-22 (×2): 2 [IU] via SUBCUTANEOUS

## 2013-09-21 MED ORDER — POLYETHYLENE GLYCOL 3350 17 G PO PACK
17.0000 g | PACK | Freq: Two times a day (BID) | ORAL | Status: DC
Start: 2013-09-21 — End: 2013-09-22
  Administered 2013-09-21 (×2): 17 g via ORAL
  Filled 2013-09-21 (×4): qty 1

## 2013-09-21 MED ORDER — CLONIDINE HCL 0.2 MG PO TABS
0.2000 mg | ORAL_TABLET | Freq: Two times a day (BID) | ORAL | Status: DC
  Administered 2013-09-21 – 2013-09-23 (×4): 0.2 mg via ORAL
  Filled 2013-09-21 (×6): qty 1

## 2013-09-21 MED ORDER — INSULIN ASPART 100 UNIT/ML ~~LOC~~ SOLN
0.0000 [IU] | Freq: Three times a day (TID) | SUBCUTANEOUS | Status: DC
  Administered 2013-09-21: 3 [IU] via SUBCUTANEOUS

## 2013-09-21 MED ORDER — SIMVASTATIN 40 MG PO TABS
40.0000 mg | ORAL_TABLET | Freq: Every evening | ORAL | Status: DC
  Administered 2013-09-21 – 2013-09-22 (×2): 40 mg via ORAL
  Filled 2013-09-21 (×3): qty 1

## 2013-09-21 MED ORDER — GLUCOSE 40 % PO GEL
ORAL | Status: AC
  Filled 2013-09-21: qty 1

## 2013-09-21 MED ORDER — SENNOSIDES-DOCUSATE SODIUM 8.6-50 MG PO TABS
2.0000 | ORAL_TABLET | Freq: Two times a day (BID) | ORAL | Status: DC
  Administered 2013-09-21 – 2013-09-23 (×5): 2 via ORAL
  Filled 2013-09-21 (×5): qty 2

## 2013-09-21 MED ORDER — SODIUM CHLORIDE 0.9 % IV SOLN
INTRAVENOUS | Status: DC
  Administered 2013-09-21 – 2013-09-22 (×2): via INTRAVENOUS

## 2013-09-21 MED ORDER — INSULIN DETEMIR 100 UNIT/ML ~~LOC~~ SOLN: 10.0000 [IU] | Freq: Every day | SUBCUTANEOUS | Status: DC

## 2013-09-21 MED ORDER — SORBITOL 70 % SOLN
960.0000 mL | TOPICAL_OIL | Freq: Once | ORAL | Status: AC
  Administered 2013-09-21: 960 mL via RECTAL
  Filled 2013-09-21: qty 240

## 2013-09-21 MED ORDER — DEXTROSE 50 % IV SOLN: 25.0000 mL | INTRAVENOUS | Status: DC | PRN

## 2013-09-21 NOTE — Progress Notes (Signed)
Hypoglycemic Event  CBG: 46  Treatment: 15 GM carbohydrate snack  Symptoms: None  Follow-up CBG: Time:2110 CBG Result:52  Possible Reasons for Event: Inadequate meal intake  Comments/MD notified:NP Penne Lash, Alleen Borne Sison  Remember to initiate Hypoglycemia Order Set & complete

## 2013-09-21 NOTE — Progress Notes (Signed)
SMOG enema given to pt. Pt passed a large amount of stool during the procedure with some digital assistance and disimpaction.

## 2013-09-21 NOTE — H&P (Signed)
[facility] History and Physical  Judith [name] XBJ:[id number] DOB: 1940/09/10 DOA: 09/21/2013  Referring physician: Direct Admission from GI office PCP: Philis Fendt, MD   Chief Complaint: BRBPR and rectal pain/impaction  HPI: Judith [name] is a 73 y.o. female with PMH of Dementia, DM, HTN, R BKA has been having intermittent rectal/abd pain with defecation and small amounts of red blood in stools noted when her diaper is changed by family off and on for about 1 year, but more so recently. She used to be on some laxatives previously for constipation but not any more. Denies any diarrhea or constipation at this time, has incontinence. She was also having anorexia and weight loss, but has recently started eating better and drinking glucerna.  In addition has been having memory and problems with confusion intermittently. She was treated for hemorrhoidal bleeding for 1 year without much improvement and hence went to GI office today where she was noted to have fecal impaction and since family did not feel she could be appropriately managed at home she was referred per GI to Hospital as a direct admission.    Review of Systems: positives bolded Constitutional:  weight loss, night sweats, Fevers, chills, fatigue.  HEENT:  No headaches, Difficulty swallowing,Tooth/dental problems,Sore throat,  No sneezing, itching, ear ache, nasal congestion, post nasal drip,  Cardio-vascular:  No chest pain, Orthopnea, PND, swelling in lower extremities, anasarca, dizziness, palpitations  GI:  No heartburn, indigestion, abdominal pain, nausea, vomiting, diarrhea, change in bowel habits, loss of appetite, rectal pain, blood in stools Resp:  No shortness of breath with exertion or at rest. No excess mucus, no productive cough, No non-productive cough, No coughing up of blood.No change in color of mucus.No wheezing.No chest wall deformity  Skin:  no rash or lesions.  GU:  no dysuria, change in color of  urine, no urgency or frequency. No flank pain.  Musculoskeletal:  No joint pain or swelling. No decreased range of motion. No back pain.  Psych:  No change in mood or affect. No depression or anxiety. No memory loss.   Past Medical History  Diagnosis Date  . Hypertension   . Insulin dependent diabetes mellitus 03/01/2012  . Closed right ankle fracture   . Hyperlipidemia   . Fracture of femur, distal, right, closed 03/01/2012  . Obesity (BMI 30-39.9) 03/03/2012  . Hemorrhoid   . Asthma   . Pneumonia     "once" (05/17/2013)  . History of stomach ulcers   . GERD (gastroesophageal reflux disease)   . HYQMVHQI(696.2)     "probably weekly" (05/17/2013)  . 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 Arthritis     "left leg" (05/17/2013)  . Proctocolitis 09/04/02  . Colitis 09/06/02   Past Surgical History  Procedure Laterality Date  . 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 Orif femur fracture  03/01/2012    Procedure: OPEN REDUCTION INTERNAL FIXATION (ORIF) DISTAL FEMUR FRACTURE;  Surgeon: Rozanna Box, MD;  Location: [facility];  Service: Orthopedics;  Laterality: Right;  ORIF right femur  . Cataract extraction w/ intraocular lens  implant, bilateral Bilateral   . Breast lumpectomy Bilateral     "not cancer"  . Abdominal hysterectomy     Social History:  reports that she quit smoking about 20 years ago. She has never used smokeless tobacco. She reports that she does not drink alcohol or use illicit drugs.  No Known Allergies  Family History  Problem Relation Age of  Onset  . Diabetes Mother   . Diabetes Father   . Diabetes Sister     x3  . Diabetes Brother     x4  . Colon cancer Neg Hx   . Colon polyps Neg Hx      Prior to Admission medications   Medication Sig Start Date End Date Taking? Authorizing Provider  aspirin EC 81 MG tablet Take 81 mg by mouth daily.    Historical Provider, MD  cloNIDine (CATAPRES) 0.1 MG tablet Take 0.5 tablets (0.05 mg total) by mouth 3 (three) times  daily. 02/20/13   Geradine Girt, DO  glipizide-metformin (METAGLIP) 2.5-250 MG per tablet Take 2 tablets by mouth daily after breakfast.    Historical Provider, MD  insulin detemir (LEVEMIR) 100 UNIT/ML injection Inject 0.12 mLs (12 Units total) into the skin daily. 05/19/13   Kelvin Cellar, MD  Multiple Vitamin (MULTIVITAMIN WITH MINERALS) TABS Take 1 tablet by mouth daily. 03/23/12   Lavon Paganini Angiulli, PA-C  simvastatin (ZOCOR) 40 MG tablet Take 1 tablet (40 mg total) by mouth every evening. 03/23/12   Lavon Paganini Angiulli, PA-C  starch (ANUSOL) 51 % suppository Place 1 suppository rectally as needed for pain. 12/19/12   Clayton Bibles, PA-C   Physical Exam: Filed Vitals:   09/21/13 1425  BP: 100/50  Pulse: 62  Temp: 98.4 F (36.9 C)  TempSrc: Oral  Resp: 16  Height: 5\' 3"  (1.6 m)  Weight: 74.844 kg (165 lb)  SpO2: 96%    Wt Readings from Last 3 Encounters:  09/21/13 74.844 kg (165 lb)  05/19/13 80.4 kg (177 lb 4 oz)  02/18/13 85.14 kg (187 lb 11.2 oz)    General:  Appears calm and comfortable, AAO to self, place and partly to time Eyes: PERRL, normal lids, irises & conjunctiva ENT: grossly normal lips & tongue Neck: no LAD, masses or thyromegaly Cardiovascular: RRR, no m/r/g. No LE edema. Respiratory: CTA bilaterally, no w/r/r. Normal respiratory effort. Abdomen: soft, ntnd Skin: no rash or induration seen on limited exam Musculoskeletal: R BKA Psychiatric: grossly normal mood and affect, speech fluent and appropriate Neurologic: grossly non-focal.          Labs on Admission:  Basic Metabolic Panel: No results found for this basename: NA, K, CL, CO2, GLUCOSE, BUN, CREATININE, CALCIUM, MG, PHOS,  in the last 168 hours Liver Function Tests: No results found for this basename: AST, ALT, ALKPHOS, BILITOT, PROT, ALBUMIN,  in the last 168 hours No results found for this basename: LIPASE, AMYLASE,  in the last 168 hours No results found for this basename: AMMONIA,  in the last 168  hours CBC: No results found for this basename: WBC, NEUTROABS, HGB, HCT, MCV, PLT,  in the last 168 hours Cardiac Enzymes: No results found for this basename: CKTOTAL, CKMB, CKMBINDEX, TROPONINI,  in the last 168 hours  BNP (last 3 results) No results found for this basename: PROBNP,  in the last 8760 hours CBG: No results found for this basename: GLUCAP,  in the last 168 hours  Radiological Exams on Admission: No results found.  Assessment/Plan   1. Fecal impaction/ rectal pain with BRBPR  -check KUB -H/o colonoscopy over 10years ago, reportedly normal then -check KUB -clears, IVF, Add senokot and Miralax -GI to follow, may plan flex sig depending on clinical progression  2. H/o urinary incontinence and intermittent retention -check bladder scan, monitor clinically  3. DM -continue levemir at lower dose, SSI -hold metformin, glipizide  4. Dementia -with  intermittent confusion, stable now  5. R BKA  6. HTN -continue clonidine at lower dose  ALL LABS PENDING  DVT proph: SCDs  Code Status: Full Code DVT Prophylaxis: SCDs Family Communication: d/w daughter in law at bedside Disposition Plan: inpt  Time spent: 1min  [facility] Pager [phone]  **Disclaimer: This note may have been dictated with voice recognition software. Similar sounding words can inadvertently be transcribed and this note may contain transcription errors which may not have been corrected upon publication of note.**

## 2013-09-21 NOTE — Progress Notes (Signed)
Subjective:    Patient ID: Judith [name], female    DOB: [birth year], 73 y.o.   MRN: [id number]  HPI Judith [name] is a 73 year old African American female new to our office today brought in the office by a family member and referred by Dr. Karie Chimera with complaints of hemorrhoids. Patient is unable to offer much pertinent history but denies any abdominal pain she does complain of rectal pain. Her daughter states that she did have a prior colonoscopy done 49 or 14 years ago here in [place] but does not remember who did that. I see Dr. Lula Olszewski name listed in her old records in [place] find the colonoscopy report. Patient is an insulin-dependent diabetic, she is status post right BKA and is wheelchair-bound. She has had some problems with delirium and/or dementia recently, also with obesity and hypertension. Daughter states that she is incontinent of stool generally and has had problems with constipation but past couple of months has been having soft to loose bowel movements and also complaining of rectal pain. He apparently intermittently has some bright red blood noted on the tissue. She has been gaining a stool softener at home but has not been on any laxatives. Patient does not complain of abdominal pain but her daughter-in-law states that her appetite has been poor and she has been losing weight. She has care givers at home but not 24 hours a day, has a granddaughter that stays with her part of the day.   Review of Systems  Constitutional: Positive for appetite change and unexpected weight change.  HENT: Negative.   Respiratory: Negative.   Cardiovascular: Negative.   Gastrointestinal: Positive for diarrhea, constipation, blood in stool and rectal pain.  Endocrine: Negative.   Genitourinary: Positive for enuresis.  Musculoskeletal: Positive for gait problem.  Skin: Negative.   Allergic/Immunologic: Negative.   Neurological: Positive for weakness.  Hematological: Negative.     Psychiatric/Behavioral: Negative.    Outpatient Prescriptions Prior to Visit  Medication Sig Dispense Refill  . aspirin EC 81 MG tablet Take 81 mg by mouth daily.      . cloNIDine (CATAPRES) 0.1 MG tablet Take 0.5 tablets (0.05 mg total) by mouth 3 (three) times daily.  60 tablet  11  . insulin detemir (LEVEMIR) 100 UNIT/ML injection Inject 0.12 mLs (12 Units total) into the skin daily.  10 mL  11  . Multiple Vitamin (MULTIVITAMIN WITH MINERALS) TABS Take 1 tablet by mouth daily.      . simvastatin (ZOCOR) 40 MG tablet Take 1 tablet (40 mg total) by mouth every evening.  30 tablet  1  . starch (ANUSOL) 51 % suppository Place 1 suppository rectally as needed for pain.  24 suppository  0   No facility-administered medications prior to visit.   No Known Allergies Patient Active Problem List   Diagnosis Date Noted  . Altered mental status 05/17/2013  . Altered mental state 05/17/2013  . Delirium due to another medical condition 02/18/2013  . Acute blood loss anemia 03/03/2012  . Hx of right BKA 03/03/2012  . Fall 03/03/2012  . Obesity (BMI 30-39.9) 03/03/2012  . Poor nutrition 03/03/2012  . Fracture of femur, distal, right, closed 03/01/2012  . UTI (lower urinary tract infection) 03/01/2012  . Insulin dependent diabetes mellitus 03/01/2012  . Hypertension    History  Substance Use Topics  . Smoking status: Former Smoker -- 1.00 packs/day for 5 years    Quit date: 01/26/1993  . Smokeless tobacco: Never Used  Comment: "quit smoking at ~ age 73"  . Alcohol Use: No   family history includes Diabetes in her brother, father, mother, and sister. There is no history of Colon cancer or Colon polyps.     Objective:   Physical Exam  well-developed elderly African American female in no acute distress, accompanied by her daughter-in-law patient is wheelchair-bound and is status post right BKA blood pressure 116/72 pulse 76 height 5 foot 3 not weighed today. HEENT; nontraumatic  normocephalic EOMI PERRLA sclera anicteric, Supple; no JVD, Cardiovascular; regular rate and rhythm with S1-S2 no murmur or gallop, Pulmonary ;clear bilaterally, Abdomen; large soft she is a palpable around the mass in the lower abdomen up to the umbilicus which may be a distended bladder this is nontender bowel sounds are present, Rectal; exam no external hemorrhoids noted on digital exam she has a large fecal impaction and is very tender to exam there is no gross blood noted stool is brown I attempted to do an anoscopy but she cannot tolerate., Extremities ;status post right BKA amputation, Neuropsych; patient is alert and cooperative affect appropriate        Assessment & Plan:  #6  73 year old African American female with rectal pain secondary to fecal impaction, she may be having intermittent hematochezia from an occult lesion or stark oral ulcer versus internal hemorrhoids. She also has a rounded palpable mass in her lower abdomen which may be a distended bladder raising the question of partial bladder outlet obstruction possibly from fecal impaction #2 insulin-dependent diabetes mellitus #3 history of delirium with hospitalization at April 2015 with UTI #4 underlying dementia #5 status post right BKA #6 wheelchair-bound nonambulatory #7 hypertension #8 obesity  Plan; patient will need admission to the hospital for disimpaction and bowel purge. Her daughter-in-law does not feel that they will be able to manage this problem on an outpatient basis .She will also need at least plain abdominal films to assess for a distended urinary bladder and may require catheterization. She may need further abdominal imaging to rule out other pelvic mass I have contacted the [facility] and she will be admitted on the hospitalist service at [facility]. She will be seen by GI in consult and can consider at least flexible sigmoidoscopy once this and action has been completed.

## 2013-09-21 NOTE — Patient Instructions (Signed)
We are admitting to [facility] hospital.  Go to [facility] tower entrance- admitting.  There is valet parking.

## 2013-09-22 DIAGNOSIS — F0391 Unspecified dementia with behavioral disturbance: Secondary | ICD-10-CM

## 2013-09-22 DIAGNOSIS — K5641 Fecal impaction: Secondary | ICD-10-CM | POA: Diagnosis not present

## 2013-09-22 DIAGNOSIS — E43 Unspecified severe protein-calorie malnutrition: Secondary | ICD-10-CM | POA: Insufficient documentation

## 2013-09-22 DIAGNOSIS — F03918 Unspecified dementia, unspecified severity, with other behavioral disturbance: Secondary | ICD-10-CM

## 2013-09-22 DIAGNOSIS — K5909 Other constipation: Secondary | ICD-10-CM

## 2013-09-22 LAB — GLUCOSE, CAPILLARY
GLUCOSE-CAPILLARY: 58 mg/dL — AB (ref 70–99)
GLUCOSE-CAPILLARY: 173 mg/dL — AB (ref 70–99)
Glucose-Capillary: 119 mg/dL — ABNORMAL HIGH (ref 70–99)
Glucose-Capillary: 156 mg/dL — ABNORMAL HIGH (ref 70–99)
Glucose-Capillary: 191 mg/dL — ABNORMAL HIGH (ref 70–99)
Glucose-Capillary: 62 mg/dL — ABNORMAL LOW (ref 70–99)
Glucose-Capillary: 73 mg/dL (ref 70–99)
Glucose-Capillary: 85 mg/dL (ref 70–99)

## 2013-09-22 LAB — BASIC METABOLIC PANEL
ANION GAP: 14 (ref 5–15)
BUN: 13 mg/dL (ref 6–23)
CHLORIDE: 105 meq/L (ref 96–112)
CO2: 23 meq/L (ref 19–32)
Calcium: 8.8 mg/dL (ref 8.4–10.5)
Creatinine, Ser: 0.64 mg/dL (ref 0.50–1.10)
GFR calc Af Amer: >90 mL/min (ref >90)
GFR calc non Af Amer: 86 mL/min — ABNORMAL LOW (ref >90)
Glucose, Bld: 64 mg/dL — ABNORMAL LOW (ref 70–99)
Potassium: 4.8 mEq/L (ref 3.7–5.3)
SODIUM: 142 meq/L (ref 137–147)

## 2013-09-22 MED ORDER — GLUCERNA SHAKE PO LIQD
237.0000 mL | Freq: Three times a day (TID) | ORAL | Status: DC
  Administered 2013-09-22 – 2013-09-23 (×3): 237 mL via ORAL

## 2013-09-22 MED ORDER — GLUCERNA SHAKE PO LIQD: 237.0000 mL | Freq: Three times a day (TID) | ORAL | Status: DC

## 2013-09-22 MED ORDER — POLYETHYLENE GLYCOL 3350 17 G PO PACK: 17.0000 g | PACK | Freq: Every day | ORAL | Status: AC

## 2013-09-22 MED ORDER — METFORMIN HCL 500 MG PO TABS: 500.0000 mg | ORAL_TABLET | Freq: Two times a day (BID) | ORAL | Status: DC

## 2013-09-22 MED ORDER — SENNOSIDES-DOCUSATE SODIUM 8.6-50 MG PO TABS: 2.0000 | ORAL_TABLET | Freq: Two times a day (BID) | ORAL | Status: DC

## 2013-09-22 MED ORDER — POLYETHYLENE GLYCOL 3350 17 G PO PACK
17.0000 g | PACK | Freq: Every day | ORAL | Status: DC
  Administered 2013-09-22 – 2013-09-23 (×2): 17 g via ORAL
  Filled 2013-09-22 (×2): qty 1

## 2013-09-22 MED ORDER — INSULIN DETEMIR 100 UNIT/ML ~~LOC~~ SOLN: 10.0000 [IU] | Freq: Every day | SUBCUTANEOUS | Status: DC

## 2013-09-22 NOTE — Progress Notes (Signed)
Hypoglycemic Event  CBG: 52  Treatment: 1 tube instant glucose  Symptoms: None  Follow-up CBG: Time:2227 CBG Result:63  Possible Reasons for Event: Inadequate meal intake  Comments/MD notified:NP Penne Lash, Alleen Borne Sison  Remember to initiate Hypoglycemia Order Set & complete

## 2013-09-22 NOTE — Progress Notes (Signed)
Hypoglycemic Event  CBG: 62  Treatment: 15 GM carbohydrate snack  Symptoms: None  Follow-up CBG: Time:1754 CBG Result:73  Possible Reasons for Event: Unknown  Comments/MD notified:Ghimire MD aware     Audria Nine F  Remember to initiate Hypoglycemia Order Set & complete

## 2013-09-22 NOTE — Care Management Note (Addendum)
    Page 1 of 1   09/22/2013     4:26:41 PM CARE MANAGEMENT NOTE 09/22/2013  Patient:  Judith [name], Judith [name]   Account Number:  [id number]  Date Initiated:  09/22/2013  Documentation initiated by:  Tomi Bamberger  Subjective/Objective Assessment:   dx fecal impaction rectal pain, rectal bleeding  admit as observation- lives with daughter n law.     Action/Plan:   Anticipated DC Date:  09/22/2013   Anticipated DC Plan:  [place]  CM consult      Choice offered to / List presented to:             Status of service:  Completed, signed off Medicare Important Message given?  NO (If response is "NO", the following Medicare IM given date fields will be blank) Date Medicare IM given:   Medicare IM given by:   Date Additional Medicare IM given:   Additional Medicare IM given by:    Discharge Disposition:  HOME/SELF CARE  Per UR Regulation:  Reviewed for med. necessity/level of care/duration of stay  If discussed at [place] of Stay Meetings, dates discussed:    Comments:  09/22/13 [name], BSN [phone] patient for dc today, no needs anticipated.  Code 44 paper work given to patient, she is now observation status.

## 2013-09-22 NOTE — Progress Notes (Signed)
INITIAL NUTRITION ASSESSMENT  DOCUMENTATION CODES Per approved criteria  -Severe malnutrition in the context of chronic illness  Pt meets criteria for severe MALNUTRITION in the context of chronic illness as evidenced by weight loss of 12% weight loss x 7 months, severe subcutaneous fat and muscle depletion.  INTERVENTION:  Provide Glucerna Shake po TID, each supplement provides 220 kcal and 10 grams of protein  Modify diet order to include Dysphagia 3 food texture  Encourage PO intake  Will continue to monitor  NUTRITION DIAGNOSIS: Inadequate oral intake related to decreased appetite, inability to chew as evidenced by weight loss of 12% x 7 months.   Goal: Pt to meet >/= 90% of their estimated nutrition needs    Monitor:  PO and supplemental intake, weight, labs, I/O's   Reason for Assessment: Pt identified as at nutrition risk on the Malnutrition Screen Tool   Admitting Dx:  BRBPR and rectal pain/impaction  ASSESSMENT: 73 y.o. female with PMH of Dementia, DM, HTN, R BKA has been having intermittent rectal/abd pain with defecation and small amounts of red blood in stools. She was also having anorexia and weight loss, but has recently started eating better and drinking glucerna.  No PO intake documented.  Pt reports having trouble eating d/t mouth soreness, having no dentures to chew. States that she was drinking Glucerna shakes at home, would like to continue receiving those during her stay. Patient states that she consumes a mechanical soft diet at home, will add dysphagia 3 diet to current diet order.  Per documentation, pt has been experiencing hypglycemic events 8/27 d/t inadequate meal intake.   Nutrition Focused Physical Exam:  Subcutaneous Fat:  Orbital Region: WNL Upper Arm Region: severe depletion Thoracic and Lumbar Region: WNL  Muscle:  Temple Region: mild depletion Clavicle Bone Region: moderate depletion Clavicle and Acromion Bone Region: moderate  depletion Scapular Bone Region: mild depletion Dorsal Hand: moderate depletion Patellar Region: WNL Anterior Thigh Region: moderate depletion Posterior Calf Region: severe depletion  Edema: LLE edema  Labs reviewed:  Glucose 231   Height: Ht Readings from Last 1 Encounters:  09/21/13 5\' 3"  (1.6 m)    Weight: Wt Readings from Last 1 Encounters:  09/22/13 166 lb 14.2 oz (75.7 kg)    Ideal Body Weight: 108 lb (adjusted for BKA)  % Ideal Body Weight: 153%  Wt Readings from Last 10 Encounters:  09/22/13 166 lb 14.2 oz (75.7 kg)  05/19/13 177 lb 4 oz (80.4 kg)  02/18/13 187 lb 11.2 oz (85.14 kg)  03/04/12 199 lb 3.2 oz (90.357 kg)  02/29/12 195 lb (88.451 kg)  02/29/12 195 lb (88.451 kg)    Usual Body Weight: 190 - 195 lb  % Usual Body Weight: 85%  BMI: 31  (adjusted for BKA)  Estimated Nutritional Needs: Kcal: 1600 -1700 Protein: 100-110g Fluid: >1.6L/day  Skin: intact  Diet Order: Cardiac  EDUCATION NEEDS: -Education not appropriate at this time  No intake or output data in the 24 hours ending 09/22/13 1130  Last BM: 8/28   Labs:   Recent Labs Lab 09/21/13 1615 09/22/13 0835  NA 139 142  K 3.8 4.8  CL 100 105  CO2 28 23  BUN 15 13  CREATININE 0.82 0.64  CALCIUM 10.0 8.8  GLUCOSE 231* 64*    CBG (last 3)   Recent Labs  09/22/13 0013 09/22/13 0515 09/22/13 0804  GLUCAP 191* 119* 85    Scheduled Meds: . aspirin EC  81 mg Oral Daily  .  cloNIDine  0.2 mg Oral BID  . insulin aspart  0-9 Units Subcutaneous Q4H  . polyethylene glycol  17 g Oral Daily  . senna-docusate  2 tablet Oral BID  . simvastatin  40 mg Oral QPM    Continuous Infusions:   Past Medical History  Diagnosis Date  . Hypertension   . Insulin dependent diabetes mellitus 03/01/2012  . Closed right ankle fracture   . Hyperlipidemia   . Fracture of femur, distal, right, closed 03/01/2012  . Obesity (BMI 30-39.9) 03/03/2012  . Hemorrhoid   . Asthma   . Pneumonia      "once" (05/17/2013)  . History of stomach ulcers   . GERD (gastroesophageal reflux disease)   . VOZDGUYQ(034.7)     "probably weekly" (05/17/2013)  . 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 Arthritis     "left leg" (05/17/2013)  . Proctocolitis 09/04/02  . Colitis 09/06/02    Past Surgical History  Procedure Laterality Date  . 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 Orif femur fracture  03/01/2012    Procedure: OPEN REDUCTION INTERNAL FIXATION (ORIF) DISTAL FEMUR FRACTURE;  Surgeon: Rozanna Box, MD;  Location: [facility];  Service: Orthopedics;  Laterality: Right;  ORIF right femur  . Cataract extraction w/ intraocular lens  implant, bilateral Bilateral   . Breast lumpectomy Bilateral     "not cancer"  . Abdominal hysterectomy      Clayton Bibles, [place], [place] Licensed Dietitian Nutritionist Pager: [phone]

## 2013-09-22 NOTE — Discharge Instructions (Signed)
Increase fiber in your diet to prevent constipation

## 2013-09-22 NOTE — Progress Notes (Signed)
Pt BP 98/46. Ghimire MD made aware. No further orders given.

## 2013-09-22 NOTE — Progress Notes (Signed)
Hypoglycemia recurred-hold discharge, stop SSI

## 2013-09-22 NOTE — Progress Notes (Signed)
Hypoglycemic Event  CBG:63  Treatment: 1 tube instant glucose  Symptoms: None  Follow-up CBG: Time:2306 CBG Result:76  Possible Reasons for Event: Inadequate meal intake   Comments/MD notified:;NP KIRBY    Judith [name]  Remember to initiate Hypoglycemia Order Set & complete

## 2013-09-22 NOTE — Discharge Summary (Addendum)
Physician Discharge Summary  Judith [name]:[id number] DOB: [birth year] DOA: 09/21/2013  PCP: Philis Fendt, MD  Admit date: 09/21/2013 Discharge date: 09/23/2013  Time spent:  minutes  Recommendations for Outpatient Follow-up:  1. Outpatient follow up  with gastroenterologist for colonscopy 2. Decreased Lantus, stopped Glipizide-please evaluate/optimize at next visit  Discharge Diagnoses:  Active Problems:   Hypertension   Insulin dependent diabetes mellitus   Hx of right BKA   Dementia   Rectal bleeding   Fecal impaction   BRBPR (bright red blood per rectum)   Discharge Condition: stable  Diet recommendation: heart health, carb modified  Filed Weights   09/21/13 1425 09/22/13 0500  Weight: 74.844 kg (165 lb) 75.7 kg (166 lb 14.2 oz)    History of present illness:  Judith [name] is a 73 y.o. female with PMH of Dementia, DM, HTN, R BKA has been having intermittent rectal/abd pain with defecation for the past year.  The pain has gotten progressively worse over the year and is associated with constipation and small amount of red blood in stools noted in her diaper when changed.  She denies any fever, chills, diarrhea, or constipation, fever, chills, or abdominal pain. Reported to gastroenterologist for follow up and was noted to have fecal impaction, and was brought to the ED by family.   Hospital Course:  Fecal impaction/ rectal pain with BRBPR  -secondary to chronic constipation -bright red blood per rectum-Xray showed fecal impaction- Disimpacted with SMOG enema by gastroenterology and  resulted in large BM output  -Much improvement after disimpaction -Will be discharged on Miralax use for chronic constipation. Please titrate MiraLax accordingly, may need to add Senokot. GI will arrange for outpatient colonoscopy  Normocytic anemia  -Likely secondary to hemorrhoids and chronic constipation -Hgb 11.2 -Last colonscopy 10 years ago; patient refuses inpatient colonscopy and  states would have it done outpatient -follow up with GI outpatient for colonscopy  Hypoglycemia -developed hypoglycemia during this hospitalizion-Suspect that she is not compliant with diet at home-therefore requiring multiple agents to control CBG's - By day of discharge, CBG reading was more than 400 and was given 10 units of NovoLog. Will stop glipizide, continue metformin, resume Lantus but that can units instead of her usual 20 units. Further optimization will need to be done by PCP on followup. Please note, patient is awake, alert and is eating well.  Diabetes Mellitus -see above. Please re-evaluate and see if Glipizide can be resumed. Dose of Levemir decreased to 10 units.  H/o urinary incontinence and intermittent retention  - monitor clinically   HTN  -continue home regimen of clonidine  R BKA   Procedures:  None  Consultations: None  Discharge Exam: Filed Vitals:   09/22/13 0938  BP: 110/57  Pulse:   Temp:   Resp:      Exam  General: Appears calm and comfortable  Eyes: PERRL, normal lids, irises & conjunctiva  ENT: grossly normal lips & tongue  Neck: no LAD, masses or thyromegaly  Cardiovascular: RRR, no m/r/g. No LE edema.  Respiratory: CTA bilaterally, no r/r/w  Normal respiratory effort.  Abdomen: soft, non-tender non-distended, + bowel sounds Skin: no rash or induration seen on limited exam  Musculoskeletal: R BKA  Psychiatric: grossly normal mood and affect, speech fluent and appropriate  Neurologic: alert and oriented to self, partly to place and time   Discharge Instructions      Discharge Instructions   Call MD for:    Complete by:  As directed  Worsening constipation or fecal impaction     Diet - low sodium heart healthy    Complete by:  As directed      Diet Carb Modified    Complete by:  As directed      Increase activity slowly    Complete by:  As directed             Medication List    STOP taking these medications        glipizide-metformin 2.5-250 MG per tablet  Commonly known as:  METAGLIP      TAKE these medications       aspirin EC 81 MG tablet  Take 81 mg by mouth daily.     cholecalciferol 1000 UNITS tablet  Commonly known as:  VITAMIN D  Take 1,000 Units by mouth daily.     cloNIDine 0.1 MG tablet  Commonly known as:  CATAPRES  Take 0.05 mg by mouth 2 (two) times daily.     feeding supplement (GLUCERNA SHAKE) Liqd  Take 237 mLs by mouth 3 (three) times daily with meals.     insulin detemir 100 UNIT/ML injection  Commonly known as:  LEVEMIR  Inject 0.1 mLs (10 Units total) into the skin daily with lunch.     metFORMIN 500 MG tablet  Commonly known as:  GLUCOPHAGE  Take 1 tablet (500 mg total) by mouth 2 (two) times daily with a meal.     multivitamin with minerals Tabs tablet  Take 1 tablet by mouth daily.     polyethylene glycol packet  Commonly known as:  MIRALAX / GLYCOLAX  Take 17 g by mouth daily.     senna-docusate 8.6-50 MG per tablet  Commonly known as:  Senokot-S  Take 2 tablets by mouth 2 (two) times daily.     simvastatin 40 MG tablet  Commonly known as:  ZOCOR  Take 1 tablet (40 mg total) by mouth every evening.       No Known Allergies Follow-up Information   Follow up with AVBUERE,EDWIN A, MD. Schedule an appointment as soon as possible for a visit in 1 week.   Specialty:  Internal Medicine   Contact information:   [facility] [place] [address] [phone]        The results of significant diagnostics from this hospitalization (including imaging, microbiology, ancillary and laboratory) are listed below for reference.    Significant Diagnostic Studies: Dg Abd 1 View  09/21/2013   CLINICAL DATA:  Abdominal pain. Abdominal distention. Constipation.  EXAM: ABDOMEN - 1 VIEW  COMPARISON:  None.  FINDINGS: Large stool ball is present within the rectum compatible with fecal impaction. Gaseous distension of colon extending cranial from the stool  ball. No plain film evidence of free air on this single supine radiograph.  IMPRESSION: Large amount of stool in the rectum compatible with fecal impaction.   Electronically Signed   By: Dereck Ligas M.D.   On: 09/21/2013 21:09    Microbiology: No results found for this or any previous visit (from the past 240 hour(s)).   Labs: Basic Metabolic Panel:  Recent Labs Lab 09/21/13 1615 09/22/13 0835  NA 139 142  K 3.8 4.8  CL 100 105  CO2 28 23  GLUCOSE 231* 64*  BUN 15 13  CREATININE 0.82 0.64  CALCIUM 10.0 8.8   Liver Function Tests:  Recent Labs Lab 09/21/13 1615  AST 12  ALT 8  ALKPHOS 69  BILITOT 0.3  PROT 6.6  ALBUMIN  2.8*   No results found for this basename: LIPASE, AMYLASE,  in the last 168 hours No results found for this basename: AMMONIA,  in the last 168 hours CBC:  Recent Labs Lab 09/21/13 1615  WBC 6.4  NEUTROABS 3.8  HGB 11.2*  HCT 34.4*  MCV 82.1  PLT 258   Cardiac Enzymes: No results found for this basename: CKTOTAL, CKMB, CKMBINDEX, TROPONINI,  in the last 168 hours BNP: BNP (last 3 results) No results found for this basename: PROBNP,  in the last 8760 hours CBG:  Recent Labs Lab 09/21/13 2306 09/22/13 0013 09/22/13 0515 09/22/13 0804 09/22/13 1213  GLUCAP 76 191* 119* 85 156*    Signed: Nena Alexander MD [facility] 09/22/2013, 2:28 PM

## 2013-09-22 NOTE — Progress Notes (Signed)
Utilization review completed. Zauria Dombek, RN, BSN. 

## 2013-09-22 NOTE — Progress Notes (Signed)
Agree with Ms. Esterwood's assessment and plan. Carl E. Gessner, MD, FACG   

## 2013-09-22 NOTE — Progress Notes (Signed)
Note/chart reviewed. Agree with note.   Wilkes Potvin RD, LDN, CNSC [phone] Pager [phone] After Hours Pager   

## 2013-09-22 NOTE — Progress Notes (Signed)
[facility] Progress Note    Since last GI note: She feels much better after SMOG enema and disimpaction yesterday, resulted in large BM output. Feels fine this morning.  Objective: Vital signs in last 24 hours: Temp:  [98.4 F (36.9 C)-98.6 F (37 C)] 98.5 F (36.9 C) (08/28 0500) Pulse Rate:  [62-96] 96 (08/28 0500) Resp:  [16-18] 18 (08/28 0500) BP: (100-120)/(50-72) 103/63 mmHg (08/28 0500) SpO2:  [96 %] 96 % (08/28 0500) Weight:  [165 lb (74.844 kg)-166 lb 14.2 oz (75.7 kg)] 166 lb 14.2 oz (75.7 kg) (08/28 0500) Last BM Date: 09/21/13 General: alert and oriented times 3 Heart: regular rate and rythm Abdomen: soft, non-tender, non-distended, normal bowel sounds   Lab Results:  Recent Labs  09/21/13 1615  WBC 6.4  HGB 11.2*  PLT 258  MCV 82.1    Recent Labs  09/21/13 1615  NA 139  K 3.8  CL 100  CO2 28  GLUCOSE 231*  BUN 15  CREATININE 0.82  CALCIUM 10.0    Recent Labs  09/21/13 1615  PROT 6.6  ALBUMIN 2.8*  AST 12  ALT 8  ALKPHOS 69  BILITOT 0.3   No results found for this basename: INR,  in the last 72 hours   Studies/Results: Dg Abd 1 View  09/21/2013   CLINICAL DATA:  Abdominal pain. Abdominal distention. Constipation.  EXAM: ABDOMEN - 1 VIEW  COMPARISON:  None.  FINDINGS: Large stool ball is present within the rectum compatible with fecal impaction. Gaseous distension of colon extending cranial from the stool ball. No plain film evidence of free air on this single supine radiograph.  IMPRESSION: Large amount of stool in the rectum compatible with fecal impaction.   Electronically Signed   By: Dereck Ligas M.D.   On: 09/21/2013 21:09     Medications: Scheduled Meds: . aspirin EC  81 mg Oral Daily  . cloNIDine  0.2 mg Oral BID  . insulin aspart  0-9 Units Subcutaneous Q4H  . polyethylene glycol  17 g Oral BID  . senna-docusate  2 tablet Oral BID  . simvastatin  40 mg Oral QPM   Continuous Infusions: . sodium chloride  75 mL/hr at 09/22/13 0720   PRN Meds:.acetaminophen, dextrose, dextrose, ondansetron (ZOFRAN) IV    Assessment/Plan: 73 y.o. female improved after fecal disimpaction yesterday  She has not had colonsocopy in 10-15 years and I offered that we proceed with one while she is in hospital but she greatly prefers to go home today and have colonoscopy as an outpatient.  I will have my office contact her to schedule in next few weeks. For now she should begin taking once daily miralax to help prevent future bowel impactions. Would start with one dose, once daily and she can titrate this up/down as needed.    Milus Banister, MD  09/22/2013, 8:07 AM [facility] Pager [phone]

## 2013-09-23 DIAGNOSIS — K5641 Fecal impaction: Secondary | ICD-10-CM | POA: Diagnosis not present

## 2013-09-23 DIAGNOSIS — I10 Essential (primary) hypertension: Secondary | ICD-10-CM

## 2013-09-23 DIAGNOSIS — K5900 Constipation, unspecified: Secondary | ICD-10-CM

## 2013-09-23 DIAGNOSIS — E162 Hypoglycemia, unspecified: Secondary | ICD-10-CM

## 2013-09-23 LAB — GLUCOSE, CAPILLARY
GLUCOSE-CAPILLARY: 130 mg/dL — AB (ref 70–99)
GLUCOSE-CAPILLARY: 286 mg/dL — AB (ref 70–99)
GLUCOSE-CAPILLARY: 82 mg/dL (ref 70–99)
Glucose-Capillary: 103 mg/dL — ABNORMAL HIGH (ref 70–99)
Glucose-Capillary: 421 mg/dL — ABNORMAL HIGH (ref 70–99)

## 2013-09-23 MED ORDER — INSULIN ASPART 100 UNIT/ML ~~LOC~~ SOLN
10.0000 [IU] | Freq: Once | SUBCUTANEOUS | Status: AC
  Administered 2013-09-23: 10 [IU] via SUBCUTANEOUS

## 2013-09-23 MED ORDER — INSULIN ASPART 100 UNIT/ML ~~LOC~~ SOLN: 0.0000 [IU] | Freq: Three times a day (TID) | SUBCUTANEOUS | Status: DC

## 2013-09-23 NOTE — Progress Notes (Signed)
Pt's blood sugar 421. Ghimire MD made aware. 10 units of novolog ordered. Will continue to monitor.

## 2013-09-23 NOTE — Discharge Summary (Signed)
Judith [name] to be D/C'd Home per MD order.  Discussed with the patient and all questions fully answered.    Medication List    STOP taking these medications       glipizide-metformin 2.5-250 MG per tablet  Commonly known as:  METAGLIP      TAKE these medications       aspirin EC 81 MG tablet  Take 81 mg by mouth daily.     cholecalciferol 1000 UNITS tablet  Commonly known as:  VITAMIN D  Take 1,000 Units by mouth daily.     cloNIDine 0.1 MG tablet  Commonly known as:  CATAPRES  Take 0.05 mg by mouth 2 (two) times daily.     feeding supplement (GLUCERNA SHAKE) Liqd  Take 237 mLs by mouth 3 (three) times daily with meals.     insulin detemir 100 UNIT/ML injection  Commonly known as:  LEVEMIR  Inject 0.1 mLs (10 Units total) into the skin daily with lunch.     metFORMIN 500 MG tablet  Commonly known as:  GLUCOPHAGE  Take 1 tablet (500 mg total) by mouth 2 (two) times daily with a meal.     multivitamin with minerals Tabs tablet  Take 1 tablet by mouth daily.     polyethylene glycol packet  Commonly known as:  MIRALAX / GLYCOLAX  Take 17 g by mouth daily.     senna-docusate 8.6-50 MG per tablet  Commonly known as:  Senokot-S  Take 2 tablets by mouth 2 (two) times daily.     simvastatin 40 MG tablet  Commonly known as:  ZOCOR  Take 1 tablet (40 mg total) by mouth every evening.        VVS, Skin clean, dry and intact without evidence of skin break down, no evidence of skin tears noted. IV catheter discontinued intact. Site without signs and symptoms of complications. Dressing and pressure applied.  An After Visit Summary was printed and given to the patient.  D/c education completed with patient/family including follow up instructions, medication list, d/c activities limitations if indicated, with other d/c instructions as indicated by MD - patient able to verbalize understanding, all questions fully answered.   Patient instructed to return to ED, call 911, or  call MD for any changes in condition.   Patient escorted via [place], and D/C home via private auto.  Audria Nine F 09/23/2013 4:25 PM

## 2013-09-23 NOTE — Progress Notes (Signed)
PATIENT DETAILS Name: Judith [name] Age: 73 y.o. Sex: female Date of Birth: 05-01-1940 Admit Date: 09/21/2013 Admitting Physician Bonnielee Haff, MD RSW:NIOEVOJ,JKKXF A, MD  Subjective: No major issues-numerous BM's overnight  Assessment/Plan: Active Problems: Fecal impaction/ rectal pain with BRBPR  -secondary to chronic constipation  -bright red blood per rectum-Xray showed fecal impaction- Disimpacted with SMOG enema by gastroenterology and resulted in large BM output  -Much improvement after disimpaction  -Will be discharged Miralax use for chronic constipation. GI will arrange for outpatient colonoscopy   Normocytic anemia  -Likely secondary to hemorrhoids and chronic constipation  -Hgb 11.2  -Last colonscopy 10 years ago; patient refuses inpatient colonscopy and states would have it done outpatient  -follow up with GI outpatient for colonscopy   Hypoglycemia  -developed hypoglycemia-all SSI/Lantus/Oral hypoglycemics on hold. Suspect that she is not compliant with diet at home-therefore requiring multiple agents to control CBG's. Plan to continue to hold all medications at on discharge.    Diabetes Mellitus  -see above.   H/o urinary incontinence and intermittent retention  - monitor clinically   HTN  -continue home regimen of clonidine   R BKA   Disposition: Remain inpatient  DVT Prophylaxis:  SCD's  Code Status: Full code  Family Communication Daughter-Denise over the phone  Procedures:  None  CONSULTS:  GI  Time spent 40 minutes-which includes 50% of the time with face-to-face with patient/ family and coordinating care related to the above assessment and plan.    MEDICATIONS: Scheduled Meds: . aspirin EC  81 mg Oral Daily  . cloNIDine  0.2 mg Oral BID  . feeding supplement (GLUCERNA SHAKE)  237 mL Oral TID WC  . polyethylene glycol  17 g Oral Daily  . senna-docusate  2 tablet Oral BID  . simvastatin  40 mg Oral QPM   Continuous  Infusions:  PRN Meds:.acetaminophen, dextrose, dextrose, ondansetron (ZOFRAN) IV  Antibiotics: Anti-infectives   None       PHYSICAL EXAM: Vital signs in last 24 hours: Filed Vitals:   09/22/13 1534 09/22/13 2137 09/23/13 0509 09/23/13 0914  BP: 98/46 122/59 125/79 111/70  Pulse: 81 88 83   Temp: 98.1 F (36.7 C) 98.5 F (36.9 C) 97.7 F (36.5 C)   TempSrc: Oral Oral Oral   Resp: 16 16 16    Height:      Weight:      SpO2: 100% 97% 99%     Weight change:  Filed Weights   09/21/13 1425 09/22/13 0500  Weight: 74.844 kg (165 lb) 75.7 kg (166 lb 14.2 oz)   Body mass index is 29.57 kg/(m^2).   Gen Exam: Awake and alert with clear speech.   Neck: Supple, No JVD.   Chest: B/L Clear.   CVS: S1 S2 Regular, no murmurs.  Abdomen: soft, BS +, non tender, non distended.  Extremities: no edema, Right BKA Neurologic: Non Focal.   Skin: No Rash.   Wounds: N/A.    Intake/Output from previous day:  Intake/Output Summary (Last 24 hours) at 09/23/13 1057 Last data filed at 09/23/13 0921  Gross per 24 hour  Intake    200 ml  Output      0 ml  Net    200 ml     LAB RESULTS: CBC  Recent Labs Lab 09/21/13 1615  WBC 6.4  HGB 11.2*  HCT 34.4*  PLT 258  MCV 82.1  MCH 26.7  MCHC 32.6  RDW 14.8  LYMPHSABS  2.0  MONOABS 0.5  EOSABS 0.1  BASOSABS 0.0    Chemistries   Recent Labs Lab 09/21/13 1615 09/22/13 0835  NA 139 142  K 3.8 4.8  CL 100 105  CO2 28 23  GLUCOSE 231* 64*  BUN 15 13  CREATININE 0.82 0.64  CALCIUM 10.0 8.8    CBG:  Recent Labs Lab 09/22/13 1752 09/22/13 2015 09/23/13 0005 09/23/13 0402 09/23/13 0804  GLUCAP 73 173* 130* 82 103*    GFR Estimated Creatinine Clearance: 60.1 ml/min (by C-G formula based on Cr of 0.64).  Coagulation profile No results found for this basename: INR, PROTIME,  in the last 168 hours  Cardiac Enzymes No results found for this basename: CK, CKMB, TROPONINI, MYOGLOBIN,  in the last 168 hours  No  components found with this basename: POCBNP,  No results found for this basename: DDIMER,  in the last 72 hours No results found for this basename: HGBA1C,  in the last 72 hours No results found for this basename: CHOL, HDL, LDLCALC, TRIG, CHOLHDL, LDLDIRECT,  in the last 72 hours No results found for this basename: TSH, T4TOTAL, FREET3, T3FREE, THYROIDAB,  in the last 72 hours No results found for this basename: VITAMINB12, FOLATE, FERRITIN, TIBC, IRON, RETICCTPCT,  in the last 72 hours No results found for this basename: LIPASE, AMYLASE,  in the last 72 hours  Urine Studies No results found for this basename: UACOL, UAPR, USPG, UPH, UTP, UGL, UKET, UBIL, UHGB, UNIT, UROB, ULEU, UEPI, UWBC, URBC, UBAC, CAST, CRYS, UCOM, BILUA,  in the last 72 hours  MICROBIOLOGY: No results found for this or any previous visit (from the past 240 hour(s)).  RADIOLOGY STUDIES/RESULTS: Dg Abd 1 View  09/21/2013   CLINICAL DATA:  Abdominal pain. Abdominal distention. Constipation.  EXAM: ABDOMEN - 1 VIEW  COMPARISON:  None.  FINDINGS: Large stool ball is present within the rectum compatible with fecal impaction. Gaseous distension of colon extending cranial from the stool ball. No plain film evidence of free air on this single supine radiograph.  IMPRESSION: Large amount of stool in the rectum compatible with fecal impaction.   Electronically Signed   By: Dereck Ligas M.D.   On: 09/21/2013 21:09    Oren Binet, MD  [facility] Pager:[phone]  If 7PM-7AM, please contact night-coverage [email or web address] Password [email or web address] 09/23/2013, 10:57 AM   LOS: 2 days   **Disclaimer: This note may have been dictated with voice recognition software. Similar sounding words can inadvertently be transcribed and this note may contain transcription errors which may not have been corrected upon publication of note.**

## 2013-09-25 ENCOUNTER — Telehealth: Payer: Self-pay

## 2013-09-25 NOTE — Telephone Encounter (Signed)
Ok, thanks.

## 2013-09-25 NOTE — Progress Notes (Signed)
Agree with Ms. Esterwood's assessment and plan. Jinna Weinman E. Emmerich Cryer, MD, FACG   

## 2013-09-25 NOTE — Telephone Encounter (Signed)
Message copied by Barron Alvine on Mon Sep 25, 2013  7:54 AM ------      Message from: Milus Banister      Created: Fri Sep 22, 2013  8:13 AM       Chong Sicilian,      She needs colonoscopy for constipation, screening in next few weeks at [facility].  Should be leaving hospital later today.            Thanks       ------

## 2013-09-25 NOTE — Telephone Encounter (Signed)
Left message on machine to call back  

## 2013-09-25 NOTE — Telephone Encounter (Signed)
The family and the pt do not feel the pt needs any further testing at this time.  She will call back if they change their minds.

## 2013-11-01 ENCOUNTER — Ambulatory Visit: Payer: Medicare Other | Admitting: Gastroenterology

## 2014-02-26 ENCOUNTER — Ambulatory Visit
Admission: RE | Admit: 2014-02-26 | Discharge: 2014-02-26 | Disposition: A | Discharge status: Home / Self Care | Payer: No Typology Code available for payment source | Source: Ambulatory Visit | Attending: Infectious Disease | Admitting: Infectious Disease

## 2014-02-26 ENCOUNTER — Other Ambulatory Visit: Payer: Self-pay | Admitting: Infectious Disease

## 2014-02-26 DIAGNOSIS — R7611 Nonspecific reaction to tuberculin skin test without active tuberculosis: Secondary | ICD-10-CM

## 2014-03-05 ENCOUNTER — Ambulatory Visit
Admission: RE | Admit: 2014-03-05 | Discharge: 2014-03-05 | Disposition: A | Discharge status: Home / Self Care | Payer: No Typology Code available for payment source | Source: Ambulatory Visit | Attending: Infectious Disease | Admitting: Infectious Disease

## 2014-03-05 ENCOUNTER — Other Ambulatory Visit: Payer: Self-pay | Admitting: Infectious Disease

## 2014-03-05 DIAGNOSIS — R7611 Nonspecific reaction to tuberculin skin test without active tuberculosis: Secondary | ICD-10-CM

## 2014-04-28 ENCOUNTER — Emergency Department (HOSPITAL_COMMUNITY)
Admission: EM | Admit: 2014-04-28 | Discharge: 2014-04-28 | Disposition: A | Discharge status: Home / Self Care | Payer: Medicare Other | Attending: Emergency Medicine | Admitting: Emergency Medicine

## 2014-04-28 ENCOUNTER — Encounter (HOSPITAL_COMMUNITY): Payer: Self-pay

## 2014-04-28 DIAGNOSIS — M62838 Other muscle spasm: Secondary | ICD-10-CM | POA: Diagnosis not present

## 2014-04-28 DIAGNOSIS — Z8719 Personal history of other diseases of the digestive system: Secondary | ICD-10-CM | POA: Diagnosis not present

## 2014-04-28 DIAGNOSIS — F419 Anxiety disorder, unspecified: Secondary | ICD-10-CM | POA: Diagnosis not present

## 2014-04-28 DIAGNOSIS — E785 Hyperlipidemia, unspecified: Secondary | ICD-10-CM | POA: Diagnosis not present

## 2014-04-28 DIAGNOSIS — E669 Obesity, unspecified: Secondary | ICD-10-CM | POA: Diagnosis not present

## 2014-04-28 DIAGNOSIS — Z79899 Other long term (current) drug therapy: Secondary | ICD-10-CM | POA: Diagnosis not present

## 2014-04-28 DIAGNOSIS — M1990 Unspecified osteoarthritis, unspecified site: Secondary | ICD-10-CM | POA: Diagnosis not present

## 2014-04-28 DIAGNOSIS — Z794 Long term (current) use of insulin: Secondary | ICD-10-CM | POA: Insufficient documentation

## 2014-04-28 DIAGNOSIS — E119 Type 2 diabetes mellitus without complications: Secondary | ICD-10-CM | POA: Insufficient documentation

## 2014-04-28 DIAGNOSIS — Z7982 Long term (current) use of aspirin: Secondary | ICD-10-CM | POA: Insufficient documentation

## 2014-04-28 DIAGNOSIS — Z8701 Personal history of pneumonia (recurrent): Secondary | ICD-10-CM | POA: Insufficient documentation

## 2014-04-28 DIAGNOSIS — Z87891 Personal history of nicotine dependence: Secondary | ICD-10-CM | POA: Insufficient documentation

## 2014-04-28 DIAGNOSIS — J45909 Unspecified asthma, uncomplicated: Secondary | ICD-10-CM | POA: Insufficient documentation

## 2014-04-28 DIAGNOSIS — Z8781 Personal history of (healed) traumatic fracture: Secondary | ICD-10-CM | POA: Insufficient documentation

## 2014-04-28 DIAGNOSIS — I10 Essential (primary) hypertension: Secondary | ICD-10-CM | POA: Insufficient documentation

## 2014-04-28 LAB — MAGNESIUM: Magnesium: 1.8 mg/dL (ref 1.5–2.5)

## 2014-04-28 LAB — URINALYSIS, ROUTINE W REFLEX MICROSCOPIC
Bilirubin Urine: NEGATIVE
Glucose, UA: >1000 mg/dL — AB
Ketones, ur: 15 mg/dL — AB
Leukocytes, UA: NEGATIVE
NITRITE: NEGATIVE
PH: 6 (ref 5.0–8.0)
Protein, ur: NEGATIVE mg/dL
Specific Gravity, Urine: 1.023 (ref 1.005–1.030)
UROBILINOGEN UA: 0.2 mg/dL (ref 0.0–1.0)

## 2014-04-28 LAB — BASIC METABOLIC PANEL
Anion gap: 14 (ref 5–15)
BUN: 13 mg/dL (ref 6–23)
CO2: 23 mmol/L (ref 19–32)
CREATININE: 1.13 mg/dL — AB (ref 0.50–1.10)
Calcium: 9.6 mg/dL (ref 8.4–10.5)
Chloride: 97 mmol/L (ref 96–112)
GFR, EST AFRICAN AMERICAN: 54 mL/min — AB (ref >90)
GFR, EST NON AFRICAN AMERICAN: 47 mL/min — AB (ref >90)
GLUCOSE: 392 mg/dL — AB (ref 70–99)
POTASSIUM: 4.7 mmol/L (ref 3.5–5.1)
Sodium: 134 mmol/L — ABNORMAL LOW (ref 135–145)

## 2014-04-28 LAB — CBG MONITORING, ED: Glucose-Capillary: 327 mg/dL — ABNORMAL HIGH (ref 70–99)

## 2014-04-28 LAB — CK: Total CK: 93 U/L (ref 7–177)

## 2014-04-28 LAB — CBC WITH DIFFERENTIAL/PLATELET
Basophils Absolute: 0.1 10*3/uL (ref 0.0–0.1)
Basophils Relative: 1 % (ref 0–1)
EOS ABS: 0 10*3/uL (ref 0.0–0.7)
Eosinophils Relative: 0 % (ref 0–5)
HEMATOCRIT: 37.9 % (ref 36.0–46.0)
HEMOGLOBIN: 12.3 g/dL (ref 12.0–15.0)
LYMPHS ABS: 1.5 10*3/uL (ref 0.7–4.0)
LYMPHS PCT: 25 % (ref 12–46)
MCH: 26.3 pg (ref 26.0–34.0)
MCHC: 32.5 g/dL (ref 30.0–36.0)
MCV: 81 fL (ref 78.0–100.0)
MONO ABS: 0.5 10*3/uL (ref 0.1–1.0)
Monocytes Relative: 9 % (ref 3–12)
NEUTROS ABS: 3.9 10*3/uL (ref 1.7–7.7)
NEUTROS PCT: 65 % (ref 43–77)
Platelets: 246 10*3/uL (ref 150–400)
RBC: 4.68 MIL/uL (ref 3.87–5.11)
RDW: 15.3 % (ref 11.5–15.5)
WBC: 5.9 10*3/uL (ref 4.0–10.5)

## 2014-04-28 LAB — URINE MICROSCOPIC-ADD ON

## 2014-04-28 LAB — PHOSPHORUS: Phosphorus: 3 mg/dL (ref 2.3–4.6)

## 2014-04-28 MED ORDER — LORAZEPAM 0.5 MG PO TABS: 0.5000 mg | ORAL_TABLET | Freq: Four times a day (QID) | ORAL | Status: DC | PRN

## 2014-04-28 MED ORDER — SODIUM CHLORIDE 0.9 % IV BOLUS (SEPSIS)
1000.0000 mL | Freq: Once | INTRAVENOUS | Status: AC
  Administered 2014-04-28: 1000 mL via INTRAVENOUS

## 2014-04-28 MED ORDER — LORAZEPAM 1 MG PO TABS
0.5000 mg | ORAL_TABLET | Freq: Once | ORAL | Status: AC
  Administered 2014-04-28: 0.5 mg via ORAL
  Filled 2014-04-28: qty 1

## 2014-04-28 NOTE — Discharge Instructions (Signed)

## 2014-04-28 NOTE — ED Notes (Signed)
MD Reather Converse informed of patient blood pressure readings.

## 2014-04-28 NOTE — ED Notes (Signed)
Lab at the bedside 

## 2014-04-28 NOTE — ED Notes (Signed)
Pt started Cipro 4--16 for UTI.  Pt lives at [place].  Onset last night pt started having episodes of "drawing up" all over body.  Pt still having back pain.

## 2014-04-28 NOTE — ED Provider Notes (Signed)
CSN: [id number]     Arrival date & time 04/28/14  1705 History   First MD Initiated Contact with Patient 04/28/14 1817     Chief Complaint  Patient presents with  . Spasms     (Consider location/radiation/quality/duration/timing/severity/associated sxs/prior Treatment) Patient is a 74 y.o. female presenting with musculoskeletal pain. The history is provided by the patient.  Muscle Pain This is a new problem. The current episode started in the past 7 days (Since Monday, waxing and waning). The problem occurs daily. The problem has been waxing and waning. Associated symptoms include fatigue and urinary symptoms. Pertinent negatives include no abdominal pain, fever, nausea or weakness. Nothing aggravates the symptoms. She has tried nothing for the symptoms. The treatment provided no relief.    Past Medical History  Diagnosis Date  . Hypertension   . Insulin dependent diabetes mellitus 03/01/2012  . Closed right ankle fracture   . Hyperlipidemia   . Fracture of femur, distal, right, closed 03/01/2012  . Obesity (BMI 30-39.9) 03/03/2012  . Hemorrhoid   . Asthma   . Pneumonia     "once" (05/17/2013)  . History of stomach ulcers   . GERD (gastroesophageal reflux disease)   . BVAPOLID(030.1)     "probably weekly" (05/17/2013)  . 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 Arthritis     "left leg" (05/17/2013)  . Proctocolitis 09/04/02  . Colitis 09/06/02   Past Surgical History  Procedure Laterality Date  . 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 Orif femur fracture  03/01/2012    Procedure: OPEN REDUCTION INTERNAL FIXATION (ORIF) DISTAL FEMUR FRACTURE;  Surgeon: Rozanna Box, MD;  Location: [place];  Service: Orthopedics;  Laterality: Right;  ORIF right femur  . Cataract extraction w/ intraocular lens  implant, bilateral Bilateral   . Breast lumpectomy Bilateral     "not cancer"  . Abdominal hysterectomy     Family History  Problem Relation Age of Onset  . Diabetes Mother   . Diabetes  Father   . Diabetes Sister     x3  . Diabetes Brother     x4  . Colon cancer Neg Hx   . Colon polyps Neg Hx    History  Substance Use Topics  . Smoking status: Former Smoker -- 1.00 packs/day for 5 years    Quit date: 01/26/1993  . Smokeless tobacco: Never Used     Comment: "quit smoking at ~ age 64"  . Alcohol Use: No   OB History    No data available     Review of Systems  Constitutional: Positive for fatigue. Negative for fever.  Gastrointestinal: Negative for nausea and abdominal pain.  Neurological: Negative for weakness.  All other systems reviewed and are negative.     Allergies  Review of patient's allergies indicates no known allergies.  Home Medications   Prior to Admission medications   Medication Sig Start Date End Date Taking? Authorizing Provider  aspirin EC 81 MG tablet Take 81 mg by mouth daily.    Historical Provider, MD  cholecalciferol (VITAMIN D) 1000 UNITS tablet Take 1,000 Units by mouth daily.    Historical Provider, MD  cloNIDine (CATAPRES) 0.1 MG tablet Take 0.05 mg by mouth 2 (two) times daily.    Historical Provider, MD  feeding supplement, GLUCERNA SHAKE, (GLUCERNA SHAKE) LIQD Take 237 mLs by mouth 3 (three) times daily with meals. 09/22/13   Shanker Kristeen Mans, MD  insulin detemir (LEVEMIR) 100 UNIT/ML injection Inject  0.1 mLs (10 Units total) into the skin daily with lunch. 09/22/13   Waldon Merl, PA-C  LORazepam (ATIVAN) 0.5 MG tablet Take 1 tablet (0.5 mg total) by mouth every 6 (six) hours as needed for anxiety. 04/28/14   Larence Penning, MD  metFORMIN (GLUCOPHAGE) 500 MG tablet Take 1 tablet (500 mg total) by mouth 2 (two) times daily with a meal. 09/22/13   Jonetta Osgood, MD  Multiple Vitamin (MULTIVITAMIN WITH MINERALS) TABS Take 1 tablet by mouth daily. 03/23/12   Lavon Paganini Angiulli, PA-C  polyethylene glycol (MIRALAX / GLYCOLAX) packet Take 17 g by mouth daily. 09/22/13   Sahar Heloise Beecham, PA-C  senna-docusate (SENOKOT-S) 8.6-50 MG per tablet  Take 2 tablets by mouth 2 (two) times daily. 09/22/13   Sahar Heloise Beecham, PA-C  simvastatin (ZOCOR) 40 MG tablet Take 1 tablet (40 mg total) by mouth every evening. 03/23/12   Lavon Paganini Angiulli, PA-C   BP 204/86 mmHg  Pulse 96  Temp(Src) 98.1 F (36.7 C) (Oral)  Resp 18  Ht 5\' 3"  (1.6 m)  Wt 166 lb (75.297 kg)  BMI 29.41 kg/m2  SpO2 100% Physical Exam  Constitutional: She appears well-developed and well-nourished. She appears distressed.  Elderly female, anxious, intermittently exclaims in high-pitched voice. Holding her hands in a clenched tight position without notable muscle spasms  HENT:  Head: Normocephalic and atraumatic.  Oropharynx dry  Eyes: EOM are normal. Pupils are equal, round, and reactive to light.  Cardiovascular: Normal rate, regular rhythm, normal heart sounds and intact distal pulses.  Exam reveals no gallop and no friction rub.   No murmur heard. Pulmonary/Chest: Effort normal and breath sounds normal. No respiratory distress. She has no wheezes. She has no rales.  Abdominal: Soft. She exhibits no distension. There is no tenderness.  Nontender to deep palpation throughout. No CVA tenderness  Musculoskeletal: Normal range of motion. She exhibits no edema or tenderness.  Neurological: She is alert.  Skin: Skin is warm and dry. No rash noted. She is not diaphoretic.  Psychiatric:  Anxious  Nursing note and vitals reviewed.   ED Course  Procedures (including critical care time) Labs Review Labs Reviewed  BASIC METABOLIC PANEL - Abnormal; Notable for the following:    Sodium 134 (*)    Glucose, Bld 392 (*)    Creatinine, Ser 1.13 (*)    GFR calc non Af Amer 47 (*)    GFR calc Af Amer 54 (*)    All other components within normal limits  URINALYSIS, ROUTINE W REFLEX MICROSCOPIC - Abnormal; Notable for the following:    Glucose, UA >1000 (*)    Hgb urine dipstick SMALL (*)    Ketones, ur 15 (*)    All other components within normal limits  CBG MONITORING, ED -  Abnormal; Notable for the following:    Glucose-Capillary 327 (*)    All other components within normal limits  CBC WITH DIFFERENTIAL/PLATELET  MAGNESIUM  PHOSPHORUS  CK  URINE MICROSCOPIC-ADD ON    Imaging Review No results found.   EKG Interpretation None      MDM   Final diagnoses:  Night muscle spasms  Anxiety    74 year old female presents with complaint of body spasms. Treated as an outpatient for UTI as of yesterday. Treated with Cipro. Complains of spasming in her hands and intermittently exclaims in spasm pain. No focal symptoms otherwise. Afebrile and vital signs are stable. She is quite anxious on my exam and I feel that  anxiety and depression could play a moderate role in these episodes. She does not appear to have any actual muscular spasms that I can palpate. We will try fluid hydration and give her a small dose of Ativan. We will also check electrolytes including mag and phosphorus. Urine will be rechecked as well.  Laboratory workup reassuring with hyperglycemia the only noted abnormality. Hypertension also improved when she stopped clenching and shaking her upper extremities. Symptoms improved with 0.5 Ativan as well as 2 L fluid bolus. Discussed with the patient as well as daughter that I felt likely etiology was some combination of dehydration with some true muscle spasm but likely a high component of anxiety. She will be discharged with continued oral rehydration as well as small supply of 0.5 mg Ativan tablets. PCP follow-up should the symptoms persist.  Larence Penning, MD 04/28/14 1694  Elnora Morrison, MD 04/29/14 0040

## 2014-04-29 ENCOUNTER — Encounter (HOSPITAL_COMMUNITY): Payer: Self-pay | Admitting: Emergency Medicine

## 2014-04-29 ENCOUNTER — Emergency Department (HOSPITAL_COMMUNITY)
Admission: EM | Admit: 2014-04-29 | Discharge: 2014-04-29 | Disposition: A | Discharge status: Home / Self Care | Payer: Medicare Other | Attending: Emergency Medicine | Admitting: Emergency Medicine

## 2014-04-29 DIAGNOSIS — I10 Essential (primary) hypertension: Secondary | ICD-10-CM | POA: Insufficient documentation

## 2014-04-29 DIAGNOSIS — E669 Obesity, unspecified: Secondary | ICD-10-CM | POA: Diagnosis not present

## 2014-04-29 DIAGNOSIS — Z794 Long term (current) use of insulin: Secondary | ICD-10-CM | POA: Insufficient documentation

## 2014-04-29 DIAGNOSIS — J45909 Unspecified asthma, uncomplicated: Secondary | ICD-10-CM | POA: Insufficient documentation

## 2014-04-29 DIAGNOSIS — S80212A Abrasion, left knee, initial encounter: Secondary | ICD-10-CM | POA: Insufficient documentation

## 2014-04-29 DIAGNOSIS — W1839XA Other fall on same level, initial encounter: Secondary | ICD-10-CM | POA: Insufficient documentation

## 2014-04-29 DIAGNOSIS — Z87440 Personal history of urinary (tract) infections: Secondary | ICD-10-CM | POA: Diagnosis not present

## 2014-04-29 DIAGNOSIS — Y999 Unspecified external cause status: Secondary | ICD-10-CM | POA: Diagnosis not present

## 2014-04-29 DIAGNOSIS — Y939 Activity, unspecified: Secondary | ICD-10-CM | POA: Diagnosis not present

## 2014-04-29 DIAGNOSIS — E785 Hyperlipidemia, unspecified: Secondary | ICD-10-CM | POA: Insufficient documentation

## 2014-04-29 DIAGNOSIS — Z8719 Personal history of other diseases of the digestive system: Secondary | ICD-10-CM | POA: Diagnosis not present

## 2014-04-29 DIAGNOSIS — S99922A Unspecified injury of left foot, initial encounter: Secondary | ICD-10-CM | POA: Diagnosis not present

## 2014-04-29 DIAGNOSIS — Z79899 Other long term (current) drug therapy: Secondary | ICD-10-CM | POA: Insufficient documentation

## 2014-04-29 DIAGNOSIS — Z7982 Long term (current) use of aspirin: Secondary | ICD-10-CM | POA: Insufficient documentation

## 2014-04-29 DIAGNOSIS — Z87891 Personal history of nicotine dependence: Secondary | ICD-10-CM | POA: Insufficient documentation

## 2014-04-29 DIAGNOSIS — Z8701 Personal history of pneumonia (recurrent): Secondary | ICD-10-CM | POA: Insufficient documentation

## 2014-04-29 DIAGNOSIS — Y929 Unspecified place or not applicable: Secondary | ICD-10-CM | POA: Diagnosis not present

## 2014-04-29 DIAGNOSIS — M1990 Unspecified osteoarthritis, unspecified site: Secondary | ICD-10-CM | POA: Diagnosis not present

## 2014-04-29 DIAGNOSIS — Z8739 Personal history of other diseases of the musculoskeletal system and connective tissue: Secondary | ICD-10-CM | POA: Insufficient documentation

## 2014-04-29 DIAGNOSIS — E119 Type 2 diabetes mellitus without complications: Secondary | ICD-10-CM | POA: Diagnosis not present

## 2014-04-29 DIAGNOSIS — W19XXXA Unspecified fall, initial encounter: Secondary | ICD-10-CM

## 2014-04-29 DIAGNOSIS — S8992XA Unspecified injury of left lower leg, initial encounter: Secondary | ICD-10-CM | POA: Diagnosis present

## 2014-04-29 NOTE — Discharge Instructions (Signed)
Please apply Neosporin or another antibacterial ointment to the left toes twice a day.  Please return to the emergency department for any new or worsening symptoms or signs of infection of the toes.

## 2014-04-29 NOTE — ED Notes (Signed)
[facility] has arrived to transport the pt back to the facility.

## 2014-04-29 NOTE — ED Provider Notes (Signed)
CSN: [id number]     Arrival date & time 04/29/14  0309 History  This chart was scribed for Jola Schmidt, MD by Eustaquio Maize, ED Scribe. This patient was seen in room A11C/A11C and the patient's care was started at 3:36 AM.     Chief Complaint  Patient presents with  . Fall   The history is provided by the patient. No language interpreter was used.     HPI Comments: Judith [name] is a 74 y.o. female brought in by ambulance, who presents to the Emergency Department complaining of ground level fall that occurred earlier tonight. Nurse reports that pt does not remember the incident. The fall was unwitnessed. Pt has some bleeding noted to the bottom of her left foot by the nursing team. She was recently diagnosed with a UTI is currently on antibiotics.  Patient is not on any anticoagulants.  Patient denies neck pain or chest pain.  She denies abdominal pain.  She denies pain in her hips, or left lower leg.  She reports she feels fine and has no issues.  Earlier this evening she states she was having back pain but she reports this has since resolved.   Past Medical History  Diagnosis Date  . Hypertension   . Insulin dependent diabetes mellitus 03/01/2012  . Closed right ankle fracture   . Hyperlipidemia   . Fracture of femur, distal, right, closed 03/01/2012  . Obesity (BMI 30-39.9) 03/03/2012  . Hemorrhoid   . Asthma   . Pneumonia     "once" (05/17/2013)  . History of stomach ulcers   . GERD (gastroesophageal reflux disease)   . IONGEXBM(841.3)     "probably weekly" (05/17/2013)  . 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 Arthritis     "left leg" (05/17/2013)  . Proctocolitis 09/04/02  . Colitis 09/06/02   Past Surgical History  Procedure Laterality Date  . 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 Orif femur fracture  03/01/2012    Procedure: OPEN REDUCTION INTERNAL FIXATION (ORIF) DISTAL FEMUR FRACTURE;  Surgeon: Rozanna Box, MD;  Location: [place];  Service: Orthopedics;  Laterality:  Right;  ORIF right femur  . Cataract extraction w/ intraocular lens  implant, bilateral Bilateral   . Breast lumpectomy Bilateral     "not cancer"  . Abdominal hysterectomy     Family History  Problem Relation Age of Onset  . Diabetes Mother   . Diabetes Father   . Diabetes Sister     x3  . Diabetes Brother     x4  . Colon cancer Neg Hx   . Colon polyps Neg Hx    History  Substance Use Topics  . Smoking status: Former Smoker -- 1.00 packs/day for 5 years    Quit date: 01/26/1993  . Smokeless tobacco: Never Used     Comment: "quit smoking at ~ age 61"  . Alcohol Use: No   OB History    No data available     Review of Systems  A complete 10 system review of systems was obtained and all systems are negative except as noted in the HPI and PMH.    Allergies  Review of patient's allergies indicates no known allergies.  Home Medications   Prior to Admission medications   Medication Sig Start Date End Date Taking? Authorizing Provider  aspirin EC 81 MG tablet Take 81 mg by mouth daily.    Historical Provider, MD  cholecalciferol (VITAMIN D) 1000 UNITS tablet  Take 1,000 Units by mouth daily.    Historical Provider, MD  cloNIDine (CATAPRES) 0.1 MG tablet Take 0.05 mg by mouth 2 (two) times daily.    Historical Provider, MD  feeding supplement, GLUCERNA SHAKE, (GLUCERNA SHAKE) LIQD Take 237 mLs by mouth 3 (three) times daily with meals. 09/22/13   Shanker Kristeen Mans, MD  insulin detemir (LEVEMIR) 100 UNIT/ML injection Inject 0.1 mLs (10 Units total) into the skin daily with lunch. 09/22/13   Waldon Merl, PA-C  LORazepam (ATIVAN) 0.5 MG tablet Take 1 tablet (0.5 mg total) by mouth every 6 (six) hours as needed for anxiety. 04/28/14   Larence Penning, MD  metFORMIN (GLUCOPHAGE) 500 MG tablet Take 1 tablet (500 mg total) by mouth 2 (two) times daily with a meal. 09/22/13   Jonetta Osgood, MD  Multiple Vitamin (MULTIVITAMIN WITH MINERALS) TABS Take 1 tablet by mouth daily. 03/23/12    Lavon Paganini Angiulli, PA-C  polyethylene glycol (MIRALAX / GLYCOLAX) packet Take 17 g by mouth daily. 09/22/13   Sahar Heloise Beecham, PA-C  senna-docusate (SENOKOT-S) 8.6-50 MG per tablet Take 2 tablets by mouth 2 (two) times daily. 09/22/13   Sahar Heloise Beecham, PA-C  simvastatin (ZOCOR) 40 MG tablet Take 1 tablet (40 mg total) by mouth every evening. 03/23/12   Lavon Paganini Angiulli, PA-C   Triage Vitals: BP 154/76 mmHg  Pulse 93  Temp(Src) 98.3 F (36.8 C) (Oral)  Resp 16  Ht 5\' 5"  (1.651 m)  SpO2 94%   Physical Exam  Constitutional: She is oriented to person, place, and time. She appears well-developed and well-nourished. No distress.  HENT:  Head: Normocephalic and atraumatic.  Eyes: EOM are normal.  Neck: Normal range of motion.  No C-Spine tenderness. Full ROM of neck.     Cardiovascular: Normal rate, regular rhythm and normal heart sounds.   Pulmonary/Chest: Effort normal and breath sounds normal.  Non tender chest.   Abdominal: Soft. She exhibits no distension. There is no tenderness.  Musculoskeletal: Normal range of motion.  BKA R leg. Full ROM of right hip. Small abrasion to left knee. Full ROM of left knee and left hip. Small bleeding from left third toe on plantar surface with appearance of chronic wound without secondary signs of infection.. Chronic wound left heel; no secondary signs of infection.   No thoracic or lumbar tenderness.   Neurological: She is alert and oriented to person, place, and time.  Skin: Skin is warm and dry.  Psychiatric: She has a normal mood and affect. Judgment normal.  Nursing note and vitals reviewed.   ED Course  Procedures (including critical care time)  DIAGNOSTIC STUDIES: Oxygen Saturation is 94% on RA, normal by my interpretation.    COORDINATION OF CARE: 3:42 AM-Discussed treatment plan with pt at bedside and pt agreed to plan.   Labs Review Labs Reviewed - No data to display  Imaging Review No results found.   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  There appears to be a small amount of bleeding which is now basically stopped from the third toe which is easily resolved with pressure.  Full range of motion neck.  No neck pain.  Chest and abdomen are benign.  No head injury or complaints  of headache.  No indication for imaging or additional workup.  This appears to be a mechanical fall as the patient reports that she fell and rolled out of bed.  She states she was sleeping when this occurred.    I personally performed the services described in this documentation, which was scribed in my presence. The recorded information has been reviewed and is accurate.       Jola Schmidt, MD 04/29/14 [id number]

## 2014-04-29 NOTE — ED Notes (Signed)
Pt is from [facility]. Per EMS, pt rolled out of bed while she was sleeping. Fall was unwitnessed. Pt with lac to the bottom of her left foot. Pt was recently diagnosed with a UTI, and so she is confused. EMS states that per staff, pt is usually alert and oriented. Pt received 4 mg of Zofran en route. CBG - 480.

## 2014-04-29 NOTE — ED Notes (Signed)
0.5mg  Ativan wasted with Tilda Burrow, RN in sharps container.

## 2014-08-07 ENCOUNTER — Encounter (HOSPITAL_BASED_OUTPATIENT_CLINIC_OR_DEPARTMENT_OTHER): Payer: Medicare Other | Attending: General Surgery

## 2014-08-07 DIAGNOSIS — J449 Chronic obstructive pulmonary disease, unspecified: Secondary | ICD-10-CM | POA: Diagnosis not present

## 2014-08-07 DIAGNOSIS — M1990 Unspecified osteoarthritis, unspecified site: Secondary | ICD-10-CM | POA: Diagnosis not present

## 2014-08-07 DIAGNOSIS — Z89511 Acquired absence of right leg below knee: Secondary | ICD-10-CM | POA: Insufficient documentation

## 2014-08-07 DIAGNOSIS — E11622 Type 2 diabetes mellitus with other skin ulcer: Secondary | ICD-10-CM | POA: Insufficient documentation

## 2014-08-07 DIAGNOSIS — L97322 Non-pressure chronic ulcer of left ankle with fat layer exposed: Secondary | ICD-10-CM | POA: Insufficient documentation

## 2014-08-07 DIAGNOSIS — Z833 Family history of diabetes mellitus: Secondary | ICD-10-CM | POA: Insufficient documentation

## 2014-08-07 DIAGNOSIS — Z794 Long term (current) use of insulin: Secondary | ICD-10-CM | POA: Diagnosis not present

## 2014-08-07 DIAGNOSIS — I10 Essential (primary) hypertension: Secondary | ICD-10-CM | POA: Insufficient documentation

## 2014-08-08 LAB — GLUCOSE, CAPILLARY: Glucose-Capillary: 222 mg/dL — ABNORMAL HIGH (ref 65–99)

## 2014-08-14 DIAGNOSIS — E11622 Type 2 diabetes mellitus with other skin ulcer: Secondary | ICD-10-CM | POA: Diagnosis not present

## 2014-08-14 DIAGNOSIS — Z89511 Acquired absence of right leg below knee: Secondary | ICD-10-CM | POA: Diagnosis not present

## 2014-08-14 DIAGNOSIS — Z833 Family history of diabetes mellitus: Secondary | ICD-10-CM | POA: Diagnosis not present

## 2014-08-14 DIAGNOSIS — L97322 Non-pressure chronic ulcer of left ankle with fat layer exposed: Secondary | ICD-10-CM | POA: Diagnosis not present

## 2014-08-28 ENCOUNTER — Encounter (HOSPITAL_BASED_OUTPATIENT_CLINIC_OR_DEPARTMENT_OTHER): Payer: Medicare Other | Attending: General Surgery

## 2014-08-28 DIAGNOSIS — J449 Chronic obstructive pulmonary disease, unspecified: Secondary | ICD-10-CM | POA: Insufficient documentation

## 2014-08-28 DIAGNOSIS — S91002A Unspecified open wound, left ankle, initial encounter: Secondary | ICD-10-CM | POA: Insufficient documentation

## 2014-08-28 DIAGNOSIS — I10 Essential (primary) hypertension: Secondary | ICD-10-CM | POA: Insufficient documentation

## 2014-08-28 DIAGNOSIS — E1141 Type 2 diabetes mellitus with diabetic mononeuropathy: Secondary | ICD-10-CM | POA: Insufficient documentation

## 2014-08-28 DIAGNOSIS — M1990 Unspecified osteoarthritis, unspecified site: Secondary | ICD-10-CM | POA: Diagnosis not present

## 2014-08-28 DIAGNOSIS — X58XXXA Exposure to other specified factors, initial encounter: Secondary | ICD-10-CM | POA: Diagnosis not present

## 2014-09-11 DIAGNOSIS — J449 Chronic obstructive pulmonary disease, unspecified: Secondary | ICD-10-CM | POA: Diagnosis not present

## 2014-09-11 DIAGNOSIS — E1141 Type 2 diabetes mellitus with diabetic mononeuropathy: Secondary | ICD-10-CM | POA: Diagnosis not present

## 2014-09-11 DIAGNOSIS — I10 Essential (primary) hypertension: Secondary | ICD-10-CM | POA: Diagnosis not present

## 2014-09-11 DIAGNOSIS — S91002A Unspecified open wound, left ankle, initial encounter: Secondary | ICD-10-CM | POA: Diagnosis not present

## 2014-10-02 ENCOUNTER — Encounter (HOSPITAL_BASED_OUTPATIENT_CLINIC_OR_DEPARTMENT_OTHER): Payer: Medicare Other | Attending: General Surgery

## 2014-10-02 DIAGNOSIS — X58XXXA Exposure to other specified factors, initial encounter: Secondary | ICD-10-CM | POA: Diagnosis not present

## 2014-10-02 DIAGNOSIS — J449 Chronic obstructive pulmonary disease, unspecified: Secondary | ICD-10-CM | POA: Insufficient documentation

## 2014-10-02 DIAGNOSIS — I10 Essential (primary) hypertension: Secondary | ICD-10-CM | POA: Diagnosis not present

## 2014-10-02 DIAGNOSIS — M1990 Unspecified osteoarthritis, unspecified site: Secondary | ICD-10-CM | POA: Diagnosis not present

## 2014-10-02 DIAGNOSIS — S91002A Unspecified open wound, left ankle, initial encounter: Secondary | ICD-10-CM | POA: Insufficient documentation

## 2014-10-02 DIAGNOSIS — E1141 Type 2 diabetes mellitus with diabetic mononeuropathy: Secondary | ICD-10-CM | POA: Insufficient documentation

## 2014-10-16 DIAGNOSIS — S91002A Unspecified open wound, left ankle, initial encounter: Secondary | ICD-10-CM | POA: Diagnosis not present

## 2014-10-16 DIAGNOSIS — I10 Essential (primary) hypertension: Secondary | ICD-10-CM | POA: Diagnosis not present

## 2014-10-16 DIAGNOSIS — E1141 Type 2 diabetes mellitus with diabetic mononeuropathy: Secondary | ICD-10-CM | POA: Diagnosis not present

## 2014-10-16 DIAGNOSIS — J449 Chronic obstructive pulmonary disease, unspecified: Secondary | ICD-10-CM | POA: Diagnosis not present

## 2014-10-17 ENCOUNTER — Emergency Department (HOSPITAL_COMMUNITY)
Admission: EM | Admit: 2014-10-17 | Discharge: 2014-10-17 | Disposition: A | Discharge status: Home / Self Care | Payer: Medicare Other | Attending: Emergency Medicine | Admitting: Emergency Medicine

## 2014-10-17 ENCOUNTER — Encounter (HOSPITAL_COMMUNITY): Payer: Self-pay | Admitting: Emergency Medicine

## 2014-10-17 DIAGNOSIS — E162 Hypoglycemia, unspecified: Secondary | ICD-10-CM | POA: Diagnosis present

## 2014-10-17 DIAGNOSIS — Z794 Long term (current) use of insulin: Secondary | ICD-10-CM | POA: Diagnosis not present

## 2014-10-17 DIAGNOSIS — F419 Anxiety disorder, unspecified: Secondary | ICD-10-CM | POA: Insufficient documentation

## 2014-10-17 DIAGNOSIS — Z7982 Long term (current) use of aspirin: Secondary | ICD-10-CM | POA: Diagnosis not present

## 2014-10-17 DIAGNOSIS — E780 Pure hypercholesterolemia: Secondary | ICD-10-CM | POA: Insufficient documentation

## 2014-10-17 DIAGNOSIS — Z7952 Long term (current) use of systemic steroids: Secondary | ICD-10-CM | POA: Diagnosis not present

## 2014-10-17 DIAGNOSIS — Z79899 Other long term (current) drug therapy: Secondary | ICD-10-CM | POA: Insufficient documentation

## 2014-10-17 DIAGNOSIS — E11649 Type 2 diabetes mellitus with hypoglycemia without coma: Secondary | ICD-10-CM | POA: Insufficient documentation

## 2014-10-17 DIAGNOSIS — Z87891 Personal history of nicotine dependence: Secondary | ICD-10-CM | POA: Insufficient documentation

## 2014-10-17 DIAGNOSIS — I10 Essential (primary) hypertension: Secondary | ICD-10-CM | POA: Diagnosis not present

## 2014-10-17 HISTORY — DX: Type 2 diabetes mellitus without complications: E11.9

## 2014-10-17 HISTORY — DX: Anxiety disorder, unspecified: F41.9

## 2014-10-17 HISTORY — DX: Pure hypercholesterolemia, unspecified: E78.00

## 2014-10-17 LAB — I-STAT CHEM 8, ED
BUN: 12 mg/dL (ref 6–20)
CALCIUM ION: 1.21 mmol/L (ref 1.13–1.30)
CHLORIDE: 105 mmol/L (ref 101–111)
Creatinine, Ser: 0.8 mg/dL (ref 0.44–1.00)
GLUCOSE: 110 mg/dL — AB (ref 65–99)
HCT: 43 % (ref 36.0–46.0)
Hemoglobin: 14.6 g/dL (ref 12.0–15.0)
POTASSIUM: 4.1 mmol/L (ref 3.5–5.1)
SODIUM: 144 mmol/L (ref 135–145)
TCO2: 26 mmol/L (ref 0–100)

## 2014-10-17 LAB — CBC WITH DIFFERENTIAL/PLATELET
Basophils Absolute: 0 10*3/uL (ref 0.0–0.1)
Basophils Relative: 1 %
Eosinophils Absolute: 0.1 10*3/uL (ref 0.0–0.7)
Eosinophils Relative: 1 %
HEMATOCRIT: 40.6 % (ref 36.0–46.0)
HEMOGLOBIN: 12.8 g/dL (ref 12.0–15.0)
LYMPHS ABS: 1 10*3/uL (ref 0.7–4.0)
LYMPHS PCT: 18 %
MCH: 26.2 pg (ref 26.0–34.0)
MCHC: 31.5 g/dL (ref 30.0–36.0)
MCV: 83 fL (ref 78.0–100.0)
MONOS PCT: 6 %
Monocytes Absolute: 0.3 10*3/uL (ref 0.1–1.0)
NEUTROS ABS: 4.3 10*3/uL (ref 1.7–7.7)
NEUTROS PCT: 74 %
Platelets: 204 10*3/uL (ref 150–400)
RBC: 4.89 MIL/uL (ref 3.87–5.11)
RDW: 15.3 % (ref 11.5–15.5)
WBC: 5.8 10*3/uL (ref 4.0–10.5)

## 2014-10-17 LAB — URINE MICROSCOPIC-ADD ON

## 2014-10-17 LAB — CBG MONITORING, ED
GLUCOSE-CAPILLARY: 108 mg/dL — AB (ref 65–99)
Glucose-Capillary: 123 mg/dL — ABNORMAL HIGH (ref 65–99)

## 2014-10-17 LAB — URINALYSIS, ROUTINE W REFLEX MICROSCOPIC
Bilirubin Urine: NEGATIVE
GLUCOSE, UA: 100 mg/dL — AB
Ketones, ur: NEGATIVE mg/dL
Leukocytes, UA: NEGATIVE
Nitrite: NEGATIVE
Protein, ur: NEGATIVE mg/dL
SPECIFIC GRAVITY, URINE: 1.009 (ref 1.005–1.030)
Urobilinogen, UA: 0.2 mg/dL (ref 0.0–1.0)
pH: 7 (ref 5.0–8.0)

## 2014-10-17 NOTE — ED Notes (Signed)
[facility] at bedside to transport patient.  

## 2014-10-17 NOTE — ED Notes (Signed)
MD at bedside. 

## 2014-10-17 NOTE — Discharge Instructions (Signed)
Hypoglycemia °Hypoglycemia occurs when the glucose in your blood is too low. Glucose is a type of sugar that is your body's main energy source. Hormones, such as insulin and glucagon, control the level of glucose in the blood. Insulin lowers blood glucose and glucagon increases blood glucose. Having too much insulin in your blood stream, or not eating enough food containing sugar, can result in hypoglycemia. Hypoglycemia can happen to people with or without diabetes. It can develop quickly and can be a medical emergency.  °CAUSES  °· Missing or delaying meals. °· Not eating enough carbohydrates at meals. °· Taking too much diabetes medicine. °· Not timing your oral diabetes medicine or insulin doses with meals, snacks, and exercise. °· Nausea and vomiting. °· Certain medicines. °· Severe illnesses, such as hepatitis, kidney disorders, and certain eating disorders. °· Increased activity or exercise without eating something extra or adjusting medicines. °· Drinking too much alcohol. °· A nerve disorder that affects body functions like your heart rate, blood pressure, and digestion (autonomic neuropathy). °· A condition where the stomach muscles do not function properly (gastroparesis). Therefore, medicines and food may not absorb properly. °· Rarely, a tumor of the pancreas can produce too much insulin. °SYMPTOMS  °· Hunger. °· Sweating (diaphoresis). °· Change in body temperature. °· Shakiness. °· Headache. °· Anxiety. °· Lightheadedness. °· Irritability. °· Difficulty concentrating. °· Dry mouth. °· Tingling or numbness in the hands or feet. °· Restless sleep or sleep disturbances. °· Altered speech and coordination. °· Change in mental status. °· Seizures or prolonged convulsions. °· Combativeness. °· Drowsiness (lethargic). °· Weakness. °· Increased heart rate or palpitations. °· Confusion. °· Pale, gray skin color. °· Blurred or double vision. °· Fainting. °DIAGNOSIS  °A physical exam and medical history will be  performed. Your caregiver may make a diagnosis based on your symptoms. Blood tests and other lab tests may be performed to confirm a diagnosis. Once the diagnosis is made, your caregiver will see if your signs and symptoms go away once your blood glucose is raised.  °TREATMENT  °Usually, you can easily treat your hypoglycemia when you notice symptoms. °· Check your blood glucose. If it is less than 70 mg/dl, take one of the following:   °¨ 3-4 glucose tablets.   °¨ ½ cup juice.   °¨ ½ cup regular soda.   °¨ 1 cup skim milk.   °¨ ½-1 tube of glucose gel.   °¨ 5-6 hard candies.   °· Avoid high-fat drinks or food that may delay a rise in blood glucose levels. °· Do not take more than the recommended amount of sugary foods, drinks, gel, or tablets. Doing so will cause your blood glucose to go too high.   °· Wait 10-15 minutes and recheck your blood glucose. If it is still less than 70 mg/dl or below your target range, repeat treatment.   °· Eat a snack if it is more than 1 hour until your next meal.   °There may be a time when your blood glucose may go so low that you are unable to treat yourself at home when you start to notice symptoms. You may need someone to help you. You may even faint or be unable to swallow. If you cannot treat yourself, someone will need to bring you to the hospital.  °HOME CARE INSTRUCTIONS °· If you have diabetes, follow your diabetes management plan by: °¨ Taking your medicines as directed. °¨ Following your exercise plan. °¨ Following your meal plan. Do not skip meals. Eat on time. °¨ Testing your blood   glucose regularly. Check your blood glucose before and after exercise. If you exercise longer or different than usual, be sure to check blood glucose more frequently. °¨ Wearing your medical alert jewelry that says you have diabetes. °· Identify the cause of your hypoglycemia. Then, develop ways to prevent the recurrence of hypoglycemia. °· Do not take a hot bath or shower right after an  insulin shot. °· Always carry treatment with you. Glucose tablets are the easiest to carry. °· If you are going to drink alcohol, drink it only with meals. °· Tell friends or family members ways to keep you safe during a seizure. This may include removing hard or sharp objects from the area or turning you on your side. °· Maintain a healthy weight. °SEEK MEDICAL CARE IF:  °· You are having problems keeping your blood glucose in your target range. °· You are having frequent episodes of hypoglycemia. °· You feel you might be having side effects from your medicines. °· You are not sure why your blood glucose is dropping so low. °· You notice a change in vision or a new problem with your vision. °SEEK IMMEDIATE MEDICAL CARE IF:  °· Confusion develops. °· A change in mental status occurs. °· The inability to swallow develops. °· Fainting occurs. °Document Released: 01/12/2005 Document Revised: 01/17/2013 Document Reviewed: 05/11/2011 °[employer]® Patient Information ©2015 [employer]. This information is not intended to replace advice given to you by your health care provider. Make sure you discuss any questions you have with your health care provider. ° °

## 2014-10-17 NOTE — ED Notes (Addendum)
Pt arrives via [facility] from [facility] on [address] for c/o hypoglycemia. Employees at [facility] saw patient this am around 0530 and patient appeared her normal self, saw her again around 0630 and called ems for "stroke like symptoms." pts CBG was 38 upon fire [employer] arrival. Given 1 amp D50, CBG 198 per ems. Pt alert, speech clear, denies pain.

## 2014-10-17 NOTE — ED Provider Notes (Signed)
CSN: [id number]     Arrival date & time 10/17/14  0815 History   First MD Initiated Contact with Patient 10/17/14 0818     Chief Complaint  Patient presents with  . Hypoglycemia     (Consider location/radiation/quality/duration/timing/severity/associated sxs/prior Treatment) Patient is a 74 y.o. female presenting with hypoglycemia. The history is provided by the patient and a relative.  Hypoglycemia  patient presents from living facility with hypoglycemia. Poorly was normal last morning but then confused. Found to have sugar in the 30s. Has had episodes like this in the past. Given sugar and feeling better now. States she's felt a little bad the last few days. Has had some chills and some urinary frequency. Talk to family she has had some difficulty controlling the sugar. States it has dropped low in the times. She is not eaten yet this morning. She is back at her baseline.  Past Medical History  Diagnosis Date  . Diabetes mellitus without complication   . High cholesterol   . Hypertension   . Anxiety    History reviewed. No pertinent past surgical history. No family history on file. Social History  Substance Use Topics  . Smoking status: Former Research scientist (life sciences)  . Smokeless tobacco: None  . Alcohol Use: No   OB History    No data available     Review of Systems  Constitutional: Positive for chills. Negative for appetite change.  Cardiovascular: Negative for chest pain.  Gastrointestinal: Negative for abdominal pain.  Genitourinary: Positive for frequency.  Musculoskeletal: Negative for back pain.  Skin: Negative for wound.  Neurological: Negative for numbness.  Psychiatric/Behavioral: Positive for confusion.      Allergies  Review of patient's allergies indicates no known allergies.  Home Medications   Prior to Admission medications   Medication Sig Start Date End Date Taking? Authorizing Provider  aspirin 325 MG tablet Take 325 mg by mouth daily.   Yes Historical  Provider, MD  atorvastatin (LIPITOR) 20 MG tablet Take 20 mg by mouth daily.   Yes Historical Provider, MD  cloNIDine (CATAPRES) 0.1 MG tablet Take 0.1 mg by mouth 2 (two) times daily.   Yes Historical Provider, MD  glipiZIDE (GLUCOTROL XL) 5 MG 24 hr tablet Take 5 mg by mouth 2 (two) times daily.   Yes Historical Provider, MD  hydrocerin (EUCERIN) CREA Apply 1 application topically 2 (two) times daily. Applied to bilateral legs twice a day   Yes Historical Provider, MD  hydrocerin (EUCERIN) CREA Apply 1 application topically as needed (application to bilateral legs as needed (in addition to the scheduled twice a day applications)).   Yes Historical Provider, MD  hydrocortisone cream 1 % Apply 1 application topically 3 (three) times daily as needed (applied topically to hemorrhoids as needed for irritation/pain).   Yes Historical Provider, MD  insulin aspart (NOVOLOG) 100 UNIT/ML injection Inject 0-15 Units into the skin 3 (three) times daily before meals. 0-150 = 0 units 151-200 = 3 units 201-250 = 5 units 251-300 = 7 units 301-350 = 9 units 351-400 = 11 units 401-450 = 13 units 451-500 = 15 units and call provider   Yes Historical Provider, MD  insulin glargine (LANTUS) 100 UNIT/ML injection Inject 18 Units into the skin at bedtime.   Yes Historical Provider, MD  loperamide (IMODIUM) 2 MG capsule Take 2-4 mg by mouth as needed for diarrhea or loose stools (take 2 tablets by mouth after 1st loose stool and 1 by mouth after each additional loose stool.  max  8 mg in 24 hours.).   Yes Historical Provider, MD  LORazepam (ATIVAN) 0.5 MG tablet Take 0.5 mg by mouth every 6 (six) hours as needed for anxiety.   Yes Historical Provider, MD  magnesium hydroxide (MILK OF MAGNESIA) 400 MG/5ML suspension Take 30 mLs by mouth daily as needed (for constipation if no BM in 3 days).   Yes Historical Provider, MD   BP 145/61 mmHg  Pulse 85  Temp(Src) 97.7 F (36.5 C) (Oral)  Resp 22  SpO2 100% Physical Exam   Constitutional: She appears well-developed.  HENT:  Head: Atraumatic.  Eyes: EOM are normal.  Cardiovascular: Normal rate.   Pulmonary/Chest: Effort normal.  Abdominal: Soft.  Musculoskeletal: She exhibits no edema.  Neurological: She is alert.  Skin: Skin is warm.  Psychiatric: She has a normal mood and affect.    ED Course  Procedures (including critical care time) Labs Review Labs Reviewed  URINALYSIS, ROUTINE W REFLEX MICROSCOPIC (NOT AT [facility]) - Abnormal; Notable for the following:    APPearance CLOUDY (*)    Glucose, UA 100 (*)    Hgb urine dipstick SMALL (*)    All other components within normal limits  URINE MICROSCOPIC-ADD ON - Abnormal; Notable for the following:    Bacteria, UA FEW (*)    All other components within normal limits  CBG MONITORING, ED - Abnormal; Notable for the following:    Glucose-Capillary 123 (*)    All other components within normal limits  I-STAT CHEM 8, ED - Abnormal; Notable for the following:    Glucose, Bld 110 (*)    All other components within normal limits  CBG MONITORING, ED - Abnormal; Notable for the following:    Glucose-Capillary 108 (*)    All other components within normal limits  CBC WITH DIFFERENTIAL/PLATELET    Imaging Review No results found. I have personally reviewed and evaluated these images and lab results as part of my medical decision-making.   EKG Interpretation None      MDM   Final diagnoses:  Hypoglycemia   Patient hypoglycemia. Resolved with D50 and then with eating maintained it up. Has had some issues with walking. No other clear reason for the hypoglycemia. Will discharge home.   Davonna Belling, MD 10/19/14 [phone]

## 2014-10-17 NOTE — ED Notes (Signed)
Breakfast tray to bedside.

## 2014-10-24 ENCOUNTER — Telehealth (HOSPITAL_BASED_OUTPATIENT_CLINIC_OR_DEPARTMENT_OTHER): Payer: Self-pay | Admitting: *Deleted

## 2014-10-30 ENCOUNTER — Encounter (HOSPITAL_BASED_OUTPATIENT_CLINIC_OR_DEPARTMENT_OTHER): Payer: Medicare Other | Attending: General Surgery

## 2014-10-30 DIAGNOSIS — J449 Chronic obstructive pulmonary disease, unspecified: Secondary | ICD-10-CM | POA: Insufficient documentation

## 2014-10-30 DIAGNOSIS — I10 Essential (primary) hypertension: Secondary | ICD-10-CM | POA: Insufficient documentation

## 2014-10-30 DIAGNOSIS — Z89511 Acquired absence of right leg below knee: Secondary | ICD-10-CM | POA: Insufficient documentation

## 2014-10-30 DIAGNOSIS — M1990 Unspecified osteoarthritis, unspecified site: Secondary | ICD-10-CM | POA: Insufficient documentation

## 2014-10-30 DIAGNOSIS — X58XXXA Exposure to other specified factors, initial encounter: Secondary | ICD-10-CM | POA: Insufficient documentation

## 2014-10-30 DIAGNOSIS — S91012A Laceration without foreign body, left ankle, initial encounter: Secondary | ICD-10-CM | POA: Insufficient documentation

## 2014-10-30 DIAGNOSIS — E1140 Type 2 diabetes mellitus with diabetic neuropathy, unspecified: Secondary | ICD-10-CM | POA: Insufficient documentation

## 2014-11-06 DIAGNOSIS — I10 Essential (primary) hypertension: Secondary | ICD-10-CM | POA: Diagnosis not present

## 2014-11-06 DIAGNOSIS — M1990 Unspecified osteoarthritis, unspecified site: Secondary | ICD-10-CM | POA: Diagnosis not present

## 2014-11-06 DIAGNOSIS — J449 Chronic obstructive pulmonary disease, unspecified: Secondary | ICD-10-CM | POA: Diagnosis not present

## 2014-11-06 DIAGNOSIS — S91012A Laceration without foreign body, left ankle, initial encounter: Secondary | ICD-10-CM | POA: Diagnosis not present

## 2014-11-06 DIAGNOSIS — X58XXXA Exposure to other specified factors, initial encounter: Secondary | ICD-10-CM | POA: Diagnosis not present

## 2014-11-06 DIAGNOSIS — Z89511 Acquired absence of right leg below knee: Secondary | ICD-10-CM | POA: Diagnosis not present

## 2014-11-06 DIAGNOSIS — E1140 Type 2 diabetes mellitus with diabetic neuropathy, unspecified: Secondary | ICD-10-CM | POA: Diagnosis not present

## 2014-11-20 DIAGNOSIS — I10 Essential (primary) hypertension: Secondary | ICD-10-CM | POA: Diagnosis not present

## 2014-11-20 DIAGNOSIS — S91012A Laceration without foreign body, left ankle, initial encounter: Secondary | ICD-10-CM | POA: Diagnosis not present

## 2014-11-20 DIAGNOSIS — J449 Chronic obstructive pulmonary disease, unspecified: Secondary | ICD-10-CM | POA: Diagnosis not present

## 2014-11-20 DIAGNOSIS — Z89511 Acquired absence of right leg below knee: Secondary | ICD-10-CM | POA: Diagnosis not present

## 2014-12-04 ENCOUNTER — Encounter (HOSPITAL_BASED_OUTPATIENT_CLINIC_OR_DEPARTMENT_OTHER): Payer: Medicare Other | Attending: General Surgery

## 2014-12-04 DIAGNOSIS — E1141 Type 2 diabetes mellitus with diabetic mononeuropathy: Secondary | ICD-10-CM | POA: Insufficient documentation

## 2014-12-04 DIAGNOSIS — Z89511 Acquired absence of right leg below knee: Secondary | ICD-10-CM | POA: Diagnosis not present

## 2014-12-04 DIAGNOSIS — I10 Essential (primary) hypertension: Secondary | ICD-10-CM | POA: Diagnosis not present

## 2014-12-04 DIAGNOSIS — J449 Chronic obstructive pulmonary disease, unspecified: Secondary | ICD-10-CM | POA: Insufficient documentation

## 2014-12-04 DIAGNOSIS — M1990 Unspecified osteoarthritis, unspecified site: Secondary | ICD-10-CM | POA: Insufficient documentation

## 2014-12-04 DIAGNOSIS — L97421 Non-pressure chronic ulcer of left heel and midfoot limited to breakdown of skin: Secondary | ICD-10-CM | POA: Diagnosis not present

## 2014-12-04 DIAGNOSIS — E11622 Type 2 diabetes mellitus with other skin ulcer: Secondary | ICD-10-CM | POA: Insufficient documentation

## 2015-01-01 ENCOUNTER — Encounter (HOSPITAL_BASED_OUTPATIENT_CLINIC_OR_DEPARTMENT_OTHER): Payer: Medicare Other | Attending: General Surgery

## 2015-01-01 DIAGNOSIS — E11622 Type 2 diabetes mellitus with other skin ulcer: Secondary | ICD-10-CM | POA: Insufficient documentation

## 2015-01-01 DIAGNOSIS — M1990 Unspecified osteoarthritis, unspecified site: Secondary | ICD-10-CM | POA: Insufficient documentation

## 2015-01-01 DIAGNOSIS — L97421 Non-pressure chronic ulcer of left heel and midfoot limited to breakdown of skin: Secondary | ICD-10-CM | POA: Diagnosis not present

## 2015-01-01 DIAGNOSIS — Z89511 Acquired absence of right leg below knee: Secondary | ICD-10-CM | POA: Diagnosis not present

## 2015-01-01 DIAGNOSIS — J449 Chronic obstructive pulmonary disease, unspecified: Secondary | ICD-10-CM | POA: Diagnosis not present

## 2015-01-01 DIAGNOSIS — I10 Essential (primary) hypertension: Secondary | ICD-10-CM | POA: Diagnosis not present

## 2015-01-01 DIAGNOSIS — E1141 Type 2 diabetes mellitus with diabetic mononeuropathy: Secondary | ICD-10-CM | POA: Diagnosis not present

## 2015-01-15 DIAGNOSIS — E11622 Type 2 diabetes mellitus with other skin ulcer: Secondary | ICD-10-CM | POA: Diagnosis not present

## 2015-01-15 DIAGNOSIS — L97421 Non-pressure chronic ulcer of left heel and midfoot limited to breakdown of skin: Secondary | ICD-10-CM | POA: Diagnosis not present

## 2015-01-15 DIAGNOSIS — E1141 Type 2 diabetes mellitus with diabetic mononeuropathy: Secondary | ICD-10-CM | POA: Diagnosis not present

## 2015-01-15 DIAGNOSIS — J449 Chronic obstructive pulmonary disease, unspecified: Secondary | ICD-10-CM | POA: Diagnosis not present

## 2015-01-29 ENCOUNTER — Encounter (HOSPITAL_BASED_OUTPATIENT_CLINIC_OR_DEPARTMENT_OTHER): Payer: Medicare Other | Attending: Internal Medicine

## 2015-01-29 DIAGNOSIS — I10 Essential (primary) hypertension: Secondary | ICD-10-CM | POA: Insufficient documentation

## 2015-01-29 DIAGNOSIS — E11621 Type 2 diabetes mellitus with foot ulcer: Secondary | ICD-10-CM | POA: Insufficient documentation

## 2015-01-29 DIAGNOSIS — L97822 Non-pressure chronic ulcer of other part of left lower leg with fat layer exposed: Secondary | ICD-10-CM | POA: Insufficient documentation

## 2015-01-29 DIAGNOSIS — M1990 Unspecified osteoarthritis, unspecified site: Secondary | ICD-10-CM | POA: Insufficient documentation

## 2015-01-29 DIAGNOSIS — E1141 Type 2 diabetes mellitus with diabetic mononeuropathy: Secondary | ICD-10-CM | POA: Insufficient documentation

## 2015-01-29 DIAGNOSIS — J449 Chronic obstructive pulmonary disease, unspecified: Secondary | ICD-10-CM | POA: Insufficient documentation

## 2015-02-05 DIAGNOSIS — I10 Essential (primary) hypertension: Secondary | ICD-10-CM | POA: Diagnosis not present

## 2015-02-05 DIAGNOSIS — E1141 Type 2 diabetes mellitus with diabetic mononeuropathy: Secondary | ICD-10-CM | POA: Diagnosis not present

## 2015-02-05 DIAGNOSIS — J449 Chronic obstructive pulmonary disease, unspecified: Secondary | ICD-10-CM | POA: Diagnosis not present

## 2015-02-05 DIAGNOSIS — M1990 Unspecified osteoarthritis, unspecified site: Secondary | ICD-10-CM | POA: Diagnosis not present

## 2015-02-05 DIAGNOSIS — L97822 Non-pressure chronic ulcer of other part of left lower leg with fat layer exposed: Secondary | ICD-10-CM | POA: Diagnosis not present

## 2015-02-05 DIAGNOSIS — E11621 Type 2 diabetes mellitus with foot ulcer: Secondary | ICD-10-CM | POA: Diagnosis not present

## 2015-02-12 DIAGNOSIS — E11621 Type 2 diabetes mellitus with foot ulcer: Secondary | ICD-10-CM | POA: Diagnosis not present

## 2015-02-19 DIAGNOSIS — E11621 Type 2 diabetes mellitus with foot ulcer: Secondary | ICD-10-CM | POA: Diagnosis not present

## 2015-02-26 DIAGNOSIS — E11621 Type 2 diabetes mellitus with foot ulcer: Secondary | ICD-10-CM | POA: Diagnosis not present

## 2015-03-05 ENCOUNTER — Encounter (HOSPITAL_BASED_OUTPATIENT_CLINIC_OR_DEPARTMENT_OTHER): Payer: Medicare Other | Attending: Surgery

## 2015-03-05 DIAGNOSIS — M1990 Unspecified osteoarthritis, unspecified site: Secondary | ICD-10-CM | POA: Insufficient documentation

## 2015-03-05 DIAGNOSIS — E11621 Type 2 diabetes mellitus with foot ulcer: Secondary | ICD-10-CM | POA: Insufficient documentation

## 2015-03-05 DIAGNOSIS — E1141 Type 2 diabetes mellitus with diabetic mononeuropathy: Secondary | ICD-10-CM | POA: Insufficient documentation

## 2015-03-05 DIAGNOSIS — L97429 Non-pressure chronic ulcer of left heel and midfoot with unspecified severity: Secondary | ICD-10-CM | POA: Diagnosis not present

## 2015-03-05 DIAGNOSIS — J449 Chronic obstructive pulmonary disease, unspecified: Secondary | ICD-10-CM | POA: Diagnosis not present

## 2015-03-05 DIAGNOSIS — I10 Essential (primary) hypertension: Secondary | ICD-10-CM | POA: Insufficient documentation

## 2015-03-12 DIAGNOSIS — E11621 Type 2 diabetes mellitus with foot ulcer: Secondary | ICD-10-CM | POA: Diagnosis not present

## 2015-03-26 DIAGNOSIS — E11621 Type 2 diabetes mellitus with foot ulcer: Secondary | ICD-10-CM | POA: Diagnosis not present

## 2015-04-02 ENCOUNTER — Encounter (HOSPITAL_BASED_OUTPATIENT_CLINIC_OR_DEPARTMENT_OTHER): Payer: Medicare Other | Attending: Surgery

## 2015-04-02 DIAGNOSIS — E11621 Type 2 diabetes mellitus with foot ulcer: Secondary | ICD-10-CM | POA: Insufficient documentation

## 2015-04-02 DIAGNOSIS — E1140 Type 2 diabetes mellitus with diabetic neuropathy, unspecified: Secondary | ICD-10-CM | POA: Insufficient documentation

## 2015-04-02 DIAGNOSIS — Z89511 Acquired absence of right leg below knee: Secondary | ICD-10-CM | POA: Diagnosis not present

## 2015-04-02 DIAGNOSIS — I10 Essential (primary) hypertension: Secondary | ICD-10-CM | POA: Diagnosis not present

## 2015-04-02 DIAGNOSIS — L97429 Non-pressure chronic ulcer of left heel and midfoot with unspecified severity: Secondary | ICD-10-CM | POA: Insufficient documentation

## 2015-04-02 DIAGNOSIS — J449 Chronic obstructive pulmonary disease, unspecified: Secondary | ICD-10-CM | POA: Insufficient documentation

## 2015-04-23 DIAGNOSIS — E11621 Type 2 diabetes mellitus with foot ulcer: Secondary | ICD-10-CM | POA: Diagnosis not present

## 2015-04-30 ENCOUNTER — Encounter (HOSPITAL_BASED_OUTPATIENT_CLINIC_OR_DEPARTMENT_OTHER): Payer: Medicare Other | Attending: Surgery

## 2015-04-30 DIAGNOSIS — E11622 Type 2 diabetes mellitus with other skin ulcer: Secondary | ICD-10-CM | POA: Insufficient documentation

## 2015-04-30 DIAGNOSIS — J449 Chronic obstructive pulmonary disease, unspecified: Secondary | ICD-10-CM | POA: Diagnosis not present

## 2015-04-30 DIAGNOSIS — E1140 Type 2 diabetes mellitus with diabetic neuropathy, unspecified: Secondary | ICD-10-CM | POA: Insufficient documentation

## 2015-04-30 DIAGNOSIS — M1990 Unspecified osteoarthritis, unspecified site: Secondary | ICD-10-CM | POA: Insufficient documentation

## 2015-04-30 DIAGNOSIS — L97329 Non-pressure chronic ulcer of left ankle with unspecified severity: Secondary | ICD-10-CM | POA: Insufficient documentation

## 2015-04-30 DIAGNOSIS — I10 Essential (primary) hypertension: Secondary | ICD-10-CM | POA: Diagnosis not present

## 2015-04-30 DIAGNOSIS — Z89511 Acquired absence of right leg below knee: Secondary | ICD-10-CM | POA: Insufficient documentation

## 2015-05-07 DIAGNOSIS — E11622 Type 2 diabetes mellitus with other skin ulcer: Secondary | ICD-10-CM | POA: Diagnosis not present

## 2015-05-21 DIAGNOSIS — E11622 Type 2 diabetes mellitus with other skin ulcer: Secondary | ICD-10-CM | POA: Diagnosis not present

## 2015-05-25 ENCOUNTER — Emergency Department (HOSPITAL_COMMUNITY)
Admission: EM | Admit: 2015-05-25 | Discharge: 2015-05-26 | Disposition: A | Discharge status: Home / Self Care | Payer: Medicare Other | Attending: Emergency Medicine | Admitting: Emergency Medicine

## 2015-05-25 ENCOUNTER — Encounter (HOSPITAL_COMMUNITY): Payer: Self-pay | Admitting: Emergency Medicine

## 2015-05-25 ENCOUNTER — Emergency Department (HOSPITAL_COMMUNITY): Payer: Medicare Other

## 2015-05-25 DIAGNOSIS — I10 Essential (primary) hypertension: Secondary | ICD-10-CM | POA: Insufficient documentation

## 2015-05-25 DIAGNOSIS — N2889 Other specified disorders of kidney and ureter: Secondary | ICD-10-CM | POA: Insufficient documentation

## 2015-05-25 DIAGNOSIS — E7800 Pure hypercholesterolemia, unspecified: Secondary | ICD-10-CM | POA: Diagnosis not present

## 2015-05-25 DIAGNOSIS — Z79899 Other long term (current) drug therapy: Secondary | ICD-10-CM | POA: Diagnosis not present

## 2015-05-25 DIAGNOSIS — N858 Other specified noninflammatory disorders of uterus: Secondary | ICD-10-CM | POA: Diagnosis not present

## 2015-05-25 DIAGNOSIS — E119 Type 2 diabetes mellitus without complications: Secondary | ICD-10-CM | POA: Insufficient documentation

## 2015-05-25 DIAGNOSIS — Z7984 Long term (current) use of oral hypoglycemic drugs: Secondary | ICD-10-CM | POA: Insufficient documentation

## 2015-05-25 DIAGNOSIS — K5641 Fecal impaction: Secondary | ICD-10-CM | POA: Diagnosis not present

## 2015-05-25 DIAGNOSIS — R197 Diarrhea, unspecified: Secondary | ICD-10-CM | POA: Diagnosis present

## 2015-05-25 DIAGNOSIS — N9489 Other specified conditions associated with female genital organs and menstrual cycle: Secondary | ICD-10-CM

## 2015-05-25 DIAGNOSIS — Z794 Long term (current) use of insulin: Secondary | ICD-10-CM | POA: Diagnosis not present

## 2015-05-25 DIAGNOSIS — Z87891 Personal history of nicotine dependence: Secondary | ICD-10-CM | POA: Diagnosis not present

## 2015-05-25 DIAGNOSIS — Z7982 Long term (current) use of aspirin: Secondary | ICD-10-CM | POA: Diagnosis not present

## 2015-05-25 LAB — COMPREHENSIVE METABOLIC PANEL
ALBUMIN: 3.6 g/dL (ref 3.5–5.0)
ALT: 17 U/L (ref 14–54)
AST: 18 U/L (ref 15–41)
Alkaline Phosphatase: 89 U/L (ref 38–126)
Anion gap: 10 (ref 5–15)
BUN: 21 mg/dL — AB (ref 6–20)
CHLORIDE: 110 mmol/L (ref 101–111)
CO2: 23 mmol/L (ref 22–32)
CREATININE: 1.09 mg/dL — AB (ref 0.44–1.00)
Calcium: 9.3 mg/dL (ref 8.9–10.3)
GFR calc Af Amer: 56 mL/min — ABNORMAL LOW (ref >60)
GFR, EST NON AFRICAN AMERICAN: 48 mL/min — AB (ref >60)
GLUCOSE: 194 mg/dL — AB (ref 65–99)
POTASSIUM: 4.2 mmol/L (ref 3.5–5.1)
Sodium: 143 mmol/L (ref 135–145)
Total Bilirubin: 0.4 mg/dL (ref 0.3–1.2)
Total Protein: 7.1 g/dL (ref 6.5–8.1)

## 2015-05-25 LAB — CBC
HEMATOCRIT: 35.7 % — AB (ref 36.0–46.0)
Hemoglobin: 11.4 g/dL — ABNORMAL LOW (ref 12.0–15.0)
MCH: 26.3 pg (ref 26.0–34.0)
MCHC: 31.9 g/dL (ref 30.0–36.0)
MCV: 82.3 fL (ref 78.0–100.0)
PLATELETS: 238 10*3/uL (ref 150–400)
RBC: 4.34 MIL/uL (ref 3.87–5.11)
RDW: 15.2 % (ref 11.5–15.5)
WBC: 6.7 10*3/uL (ref 4.0–10.5)

## 2015-05-25 LAB — LIPASE, BLOOD: LIPASE: 65 U/L — AB (ref 11–51)

## 2015-05-25 LAB — POC OCCULT BLOOD, ED: FECAL OCCULT BLD: NEGATIVE

## 2015-05-25 MED ORDER — DIATRIZOATE MEGLUMINE & SODIUM 66-10 % PO SOLN
30.0000 mL | Freq: Once | ORAL | Status: AC
  Administered 2015-05-25: 30 mL via ORAL
  Filled 2015-05-25: qty 30

## 2015-05-25 MED ORDER — SODIUM CHLORIDE 0.9 % IV BOLUS (SEPSIS)
500.0000 mL | Freq: Once | INTRAVENOUS | Status: AC
  Administered 2015-05-25: 500 mL via INTRAVENOUS

## 2015-05-25 MED ORDER — IOPAMIDOL (ISOVUE-300) INJECTION 61%
100.0000 mL | Freq: Once | INTRAVENOUS | Status: AC | PRN
  Administered 2015-05-26: 100 mL via INTRAVENOUS

## 2015-05-25 NOTE — ED Notes (Signed)
Brought in by EMS from [facility] with c/o diarrhea, onset today.  Pt reports that she has been having "bouts of loose stools since this morning".  Pt denies abdominal pain or nausea.  Hx of DM---- CBG was 268 by EMS.

## 2015-05-25 NOTE — ED Provider Notes (Addendum)
CSN: [id number]     Arrival date & time 05/25/15  2124 History  By signing my name below, I, Rowan Blase, attest that this documentation has been prepared under the direction and in the presence of Orpah Greek, MD . Electronically Signed: Rowan Blase, Scribe. 05/25/2015. 11:51 PM.   Chief Complaint  Patient presents with  . Diarrhea   The history is provided by the patient. No language interpreter was used.   HPI Comments:  Judith [name] is a 75 y.o. female with PMHx of DM, HLD and HTN who presents to the Emergency Department from [facility] complaining of multiple episodes of non-bloody diarrhea today. Pt reports associated mild lower abdominal pain, worse when having a bowel movement. Pt was given Imodium without relief. Family also notes pt is an amputee and has had sores on her left foot recently. Denies vomiting, fever or recent antibiotic use.  Past Medical History  Diagnosis Date  . Diabetes mellitus without complication ([place])   . High cholesterol   . Hypertension   . Anxiety    History reviewed. No pertinent past surgical history. History reviewed. No pertinent family history. Social History  Substance Use Topics  . Smoking status: Former Research scientist (life sciences)  . Smokeless tobacco: None  . Alcohol Use: No   OB History    No data available     Review of Systems  Constitutional: Negative for fever.  Gastrointestinal: Positive for abdominal pain and diarrhea. Negative for vomiting and blood in stool.  All other systems reviewed and are negative.  Allergies  Review of patient's allergies indicates no known allergies.  Home Medications   Prior to Admission medications   Medication Sig Start Date End Date Taking? Authorizing Provider  aspirin 325 MG tablet Take 325 mg by mouth daily.   Yes Historical Provider, MD  atorvastatin (LIPITOR) 20 MG tablet Take 20 mg by mouth daily.   Yes Historical Provider, MD  chlorhexidine (PERIDEX) 0.12 % solution Use as  directed 15 mLs in the mouth or throat 2 (two) times daily. 15 ml by mouth twice daily for mouth sores after brushing teeth. Swish for seconds. Do Not rinse mouth or brush teeth after use.   Yes Historical Provider, MD  cloNIDine (CATAPRES) 0.1 MG tablet Take 0.1 mg by mouth 2 (two) times daily.   Yes Historical Provider, MD  glipiZIDE (GLUCOTROL XL) 5 MG 24 hr tablet Take 5 mg by mouth 2 (two) times daily.   Yes Historical Provider, MD  hydrocerin (EUCERIN) CREA Apply 1 application topically 2 (two) times daily. Applied to bilateral legs twice a day   Yes Historical Provider, MD  hydrocerin (EUCERIN) CREA Apply 1 application topically as needed (application to bilateral legs as needed (in addition to the scheduled twice a day applications)).   Yes Historical Provider, MD  hydrocortisone cream 1 % Apply 1 application topically 3 (three) times daily as needed (applied topically to hemorrhoids as needed for irritation/pain).   Yes Historical Provider, MD  insulin aspart (NOVOLOG) 100 UNIT/ML injection Inject 0-15 Units into the skin 3 (three) times daily before meals. 0-150 = 0 units 151-200 = 3 units 201-250 = 5 units 251-300 = 7 units 301-350 = 9 units 351-400 = 11 units 401-450 = 13 units 451-500 = 15 units and call provider   Yes Historical Provider, MD  insulin glargine (LANTUS) 100 UNIT/ML injection Inject 15 Units into the skin at bedtime.    Yes Historical Provider, MD  lisinopril (PRINIVIL,ZESTRIL) 40 MG tablet  Take 40 mg by mouth daily.   Yes Historical Provider, MD  loperamide (IMODIUM) 2 MG capsule Take 2-4 mg by mouth as needed for diarrhea or loose stools (take 2 tablets by mouth after 1st loose stool and 1 by mouth after each additional loose stool.  max 8 mg in 24 hours.).   Yes Historical Provider, MD  LORazepam (ATIVAN) 0.5 MG tablet Take 0.5 mg by mouth every 6 (six) hours as needed for anxiety.   Yes Historical Provider, MD  magic mouthwash SOLN Take 15 mLs by mouth 2 (two) times  daily.   Yes Historical Provider, MD  magnesium hydroxide (MILK OF MAGNESIA) 400 MG/5ML suspension Take 30 mLs by mouth daily as needed (for constipation if no BM in 3 days).   Yes Historical Provider, MD  metoprolol succinate (TOPROL-XL) 50 MG 24 hr tablet Take 50 mg by mouth daily. Take with or immediately following a meal.   Yes Historical Provider, MD  ranitidine (ZANTAC) 150 MG tablet Take 150 mg by mouth at bedtime.   Yes Historical Provider, MD  SODIUM CHLORIDE PO Take 30 mLs by mouth as directed. Gargle mouth with warm salt water twice daily for mouth pain.   Yes Historical Provider, MD   BP 171/78 mmHg  Pulse 83  Temp(Src) 98.6 F (37 C) (Oral)  Resp 24  SpO2 97% Physical Exam  Constitutional: She is oriented to person, place, and time. She appears well-developed and well-nourished. No distress.  HENT:  Head: Normocephalic and atraumatic.  Right Ear: Hearing normal.  Left Ear: Hearing normal.  Nose: Nose normal.  Mouth/Throat: Oropharynx is clear and moist and mucous membranes are normal.  Eyes: Conjunctivae and EOM are normal. Pupils are equal, round, and reactive to light.  Neck: Normal range of motion. Neck supple.  Cardiovascular: Normal rate, regular rhythm, S1 normal and S2 normal.  Exam reveals no gallop and no friction rub.   No murmur heard. Pulmonary/Chest: Effort normal and breath sounds normal. No respiratory distress. She exhibits no tenderness.  Abdominal: Soft. Normal appearance and bowel sounds are normal. There is no hepatosplenomegaly. There is no tenderness. There is no rebound, no guarding, no tenderness at McBurney's point and negative Murphy's sign. No hernia.  Musculoskeletal: Normal range of motion.  Neurological: She is alert and oriented to person, place, and time. She has normal strength. No cranial nerve deficit or sensory deficit. Coordination normal. GCS eye subscore is 4. GCS verbal subscore is 5. GCS motor subscore is 6.  Skin: Skin is warm, dry  and intact. No rash noted. No cyanosis.  Small amount of skin breakdown on left heel without erythema, swelling or drainage  Psychiatric: She has a normal mood and affect. Her speech is normal and behavior is normal. Thought content normal.  Nursing note and vitals reviewed.   ED Course  Procedures  DIAGNOSTIC STUDIES:  Oxygen Saturation is 100% on RA, normal by my interpretation.    COORDINATION OF CARE:  11:12 PM Will administer fluids and order CT A/P. Discussed treatment plan with pt at bedside and pt agreed to plan.  Labs Review Labs Reviewed  LIPASE, BLOOD - Abnormal; Notable for the following:    Lipase 65 (*)    All other components within normal limits  COMPREHENSIVE METABOLIC PANEL - Abnormal; Notable for the following:    Glucose, Bld 194 (*)    BUN 21 (*)    Creatinine, Ser 1.09 (*)    GFR calc non Af Amer 48 (*)  GFR calc Af Amer 56 (*)    All other components within normal limits  CBC - Abnormal; Notable for the following:    Hemoglobin 11.4 (*)    HCT 35.7 (*)    All other components within normal limits  GASTROINTESTINAL PANEL BY PCR, STOOL (REPLACES STOOL CULTURE)  C DIFFICILE QUICK SCREEN W PCR REFLEX  URINALYSIS, ROUTINE W REFLEX MICROSCOPIC (NOT AT [facility])  OCCULT BLOOD X 1 CARD TO LAB, STOOL  POC OCCULT BLOOD, ED    Imaging Review Ct Abdomen Pelvis W Contrast  05/26/2015  CLINICAL DATA:  Abdominal pain with intermittent non bloody diarrhea today. EXAM: CT ABDOMEN AND PELVIS WITH CONTRAST TECHNIQUE: Multidetector CT imaging of the abdomen and pelvis was performed using the standard protocol following bolus administration of intravenous contrast. CONTRAST:  189mL ISOVUE-300 IOPAMIDOL (ISOVUE-300) INJECTION 61% COMPARISON:  None. FINDINGS: Lower chest and abdominal wall: Gastroesophageal reflux or poor clearance. Question Nissen fundoplication. Atherosclerosis Hepatobiliary: No focal liver abnormality.No evidence of biliary obstruction or stone. Pancreas:  Unremarkable. Spleen: Unremarkable. Adrenals/Urinary Tract: Negative adrenals. No hydronephrosis or stone. Extensive bilateral renal cortical thinning with scattered scarring greater on the left. At least 4 right and 1 left renal lesions which appears solid and neoplastic: 18 mm in the right upper pole 18 mm in the right lower pole. ~Two 8 mm lesions in the interpolar right kidney 22 mm in left lower pole Bladder diverticula compatible with chronic outlet obstruction. No acute bladder finding. Reproductive:Thickened appearance of the endometrium with internal fluid showing the irregularly marginated endometrium. Negative adnexae. Stomach/Bowel: The rectum is thick walled with mesorectal edema. There is rectal distension to 8 mm by stool. No proximal obstruction is noted. No appendicitis. Vascular/Lymphatic: No acute vascular abnormality. No mass or adenopathy. Peritoneal: No ascites or pneumoperitoneum. Musculoskeletal: No acute abnormalities. IMPRESSION: 1. Rectal impaction with proctitis suggesting stercoral colitis. 2. Thickened and irregular endometrium suggesting carcinoma. Recommend gynecologic referral. 3. 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 Recommend renal MRI with contrast. 4. Chronic bladder outlet obstruction with diverticula. 5. Poor esophageal clearance versus gastroesophageal reflux. Question Nissen fundoplication. Electronically Signed   By: Monte Fantasia M.D.   On: 05/26/2015 00:33   I have personally reviewed and evaluated these images and lab results as part of my medical decision-making.   EKG Interpretation None      MDM   Final diagnoses:  Fecal impaction ([place])  Kidney mass  Endometrial mass    Brought to the emergency department for evaluation of diarrhea. Patient has been having watery stools. There has not been any blood in her stool. Abdominal exam is benign but she did complain of some cramping and pain earlier. CT scan was performed and does show evidence  of rectal impaction but no other evidence of obstruction or inflammatory changes. Manual disimpaction Attempted by myself, but stool is soft and clay like, difficult to remove. Enema will be attempted and patient will be prescribed laxatives.  CT scan does have incidental findings of bilateral renal masses that are concerning for cancer. Additionally she has irregular endometrium that is consistent with possible carcinoma as well. Findings were discussed with the patient and she will need to follow-up  I personally performed the services described in this documentation, which was scribed in my presence. The recorded information has been reviewed and is accurate.    Orpah Greek, MD 05/26/15 [place], MD 05/26/15 [phone]

## 2015-05-25 NOTE — ED Notes (Signed)
Bed: WA23 Expected date:  Expected time:  Means of arrival:  Comments: EMS 

## 2015-05-26 LAB — GASTROINTESTINAL PANEL BY PCR, STOOL (REPLACES STOOL CULTURE)
ADENOVIRUS F40/41: NOT DETECTED
Astrovirus: NOT DETECTED
CRYPTOSPORIDIUM: NOT DETECTED
CYCLOSPORA CAYETANENSIS: NOT DETECTED
Campylobacter species: NOT DETECTED
E. coli O157: NOT DETECTED
ENTEROAGGREGATIVE E COLI (EAEC): NOT DETECTED
ENTEROPATHOGENIC E COLI (EPEC): NOT DETECTED
Entamoeba histolytica: NOT DETECTED
Enterotoxigenic E coli (ETEC): NOT DETECTED
GIARDIA LAMBLIA: NOT DETECTED
Norovirus GI/GII: NOT DETECTED
Plesimonas shigelloides: NOT DETECTED
Rotavirus A: NOT DETECTED
SALMONELLA SPECIES: NOT DETECTED
SHIGELLA/ENTEROINVASIVE E COLI (EIEC): NOT DETECTED
Sapovirus (I, II, IV, and V): NOT DETECTED
Shiga like toxin producing E coli (STEC): NOT DETECTED
VIBRIO SPECIES: NOT DETECTED
Vibrio cholerae: NOT DETECTED
YERSINIA ENTEROCOLITICA: NOT DETECTED

## 2015-05-26 LAB — C DIFFICILE QUICK SCREEN W PCR REFLEX
C Diff antigen: NEGATIVE
C Diff interpretation: NEGATIVE
C Diff toxin: NEGATIVE

## 2015-05-26 MED ORDER — POLYETHYLENE GLYCOL 3350 17 G PO PACK: 17.0000 g | PACK | Freq: Every day | ORAL | Status: DC

## 2015-05-26 MED ORDER — DOCUSATE SODIUM 100 MG PO CAPS: 100.0000 mg | ORAL_CAPSULE | Freq: Two times a day (BID) | ORAL | Status: DC

## 2015-05-26 NOTE — Discharge Instructions (Signed)
Your diarrhea is being caused by impaction which was removed. The CAT scan did show a mass in the kidney as well as abnormality in your uterus. Both of these areas are concerning for possible cancer. You need to follow-up with a primary care doctor and I will refer you to our cancer center.  Fecal Impaction A fecal impaction happens when there is a large, firm amount of stool (or feces) that cannot be passed. The impacted stool is usually in the rectum, which is the lowest part of the large bowel. The impacted stool can block the colon and cause significant problems. CAUSES  The longer stool stays in the rectum, the harder it gets. Anything that slows down your bowel movements can lead to fecal impaction, such as:  Constipation. This can be a long-standing (chronic) problem or can happen suddenly (acute).  Painful conditions of the rectum, such as hemorrhoids or anal fissures. The pain of these conditions can make you try to avoid having bowel movements.  Narcotic pain-relieving medicines, such as methadone, morphine, or codeine.  Not drinking enough fluids.  Inactivity and bed rest over long periods of time.  Diseases of the brain or nervous system that damage the nerves controlling the muscles of the intestines. SIGNS AND SYMPTOMS   Lack of normal bowel movements or changes in bowel patterns.  Sense of fullness in the rectum but unable to pass stool.  Pain or cramps in the abdominal area (often after meals).  Thin, watery discharge from the rectum. DIAGNOSIS  Your health care provider may suspect that you have a fecal impaction based on your symptoms and a physical exam. This will include an exam of your rectum. Sometimes X-rays or lab testing may be needed to confirm the diagnosis and to be sure there are no other problems.  TREATMENT   Initially an impaction can be removed manually. Using a gloved finger, your health care provider can remove hard stool from your rectum.  Medicine  is sometimes needed. A suppository or enema can be given in the rectum to soften the stool, which can stimulate a bowel movement. Medicines can also be given by mouth (orally).  Though rare, surgery may be needed if the colon has torn (perforated) due to blockage. HOME CARE INSTRUCTIONS   Develop regular bowel habits. This could include getting in the habit of having a bowel movement after your morning cup of coffee or after eating. Be sure to allow yourself enough time on the toilet.  Maintain a high-fiber diet.  Drink enough fluids to keep your urine clear or pale yellow as directed by your health care provider.  Exercise regularly.  If you begin to get constipated, increase the amount of fiber in your diet. Eat plenty of fruits, vegetables, whole wheat breads, bran, oatmeal, and similar products.  Take natural fiber laxatives or other laxatives only as directed by your health care provider. SEEK MEDICAL CARE IF:   You have ongoing rectal pain.  You require enemas or suppositories more than twice a week.  You have rectal bleeding.  You have continued problems, or you develop abdominal pain.  You have thin, pencil-like stools. SEEK IMMEDIATE MEDICAL CARE IF:  You have black or tarry stools. MAKE SURE YOU:   Understand these instructions.  Will watch your condition.  Will get help right away if you are not doing well or get worse.   This information is not intended to replace advice given to you by your health care provider. Make sure  you discuss any questions you have with your health care provider.   Document Released: 10/05/2003 Document Revised: 11/02/2012 Document Reviewed: 07/19/2012 Elsevier Interactive Patient Education [employer].

## 2015-05-26 NOTE — ED Notes (Signed)
Family at bedside. 

## 2015-05-26 NOTE — ED Notes (Signed)
Soap suds enema was administered. Pt was only able to expel stool water but nothing solid. Per Dr Betsey Holiday, pt may go back to facility with miralax rx to take daily.

## 2015-05-26 NOTE — ED Notes (Signed)
[facility] here to transport pt back to facility. Pt son at bedside. Reviewed d/c papers with son and gave him specific details on follow up for pt dx. Pt requested that son be given a copy of d/c instructions which was provided. Pt being transported back to [facility] per son.

## 2015-05-26 NOTE — ED Notes (Signed)
EDP at bedside to administer manual disimpaction---- poor result obtained.

## 2015-06-04 ENCOUNTER — Encounter (HOSPITAL_COMMUNITY): Payer: Self-pay | Admitting: Emergency Medicine

## 2015-06-04 ENCOUNTER — Encounter (HOSPITAL_BASED_OUTPATIENT_CLINIC_OR_DEPARTMENT_OTHER): Payer: Medicare Other | Attending: Surgery

## 2015-06-04 DIAGNOSIS — E1140 Type 2 diabetes mellitus with diabetic neuropathy, unspecified: Secondary | ICD-10-CM | POA: Insufficient documentation

## 2015-06-04 DIAGNOSIS — M1990 Unspecified osteoarthritis, unspecified site: Secondary | ICD-10-CM | POA: Insufficient documentation

## 2015-06-04 DIAGNOSIS — Z89511 Acquired absence of right leg below knee: Secondary | ICD-10-CM | POA: Diagnosis not present

## 2015-06-04 DIAGNOSIS — I10 Essential (primary) hypertension: Secondary | ICD-10-CM | POA: Insufficient documentation

## 2015-06-04 DIAGNOSIS — E11621 Type 2 diabetes mellitus with foot ulcer: Secondary | ICD-10-CM | POA: Insufficient documentation

## 2015-06-04 DIAGNOSIS — L97421 Non-pressure chronic ulcer of left heel and midfoot limited to breakdown of skin: Secondary | ICD-10-CM | POA: Insufficient documentation

## 2015-06-04 DIAGNOSIS — J449 Chronic obstructive pulmonary disease, unspecified: Secondary | ICD-10-CM | POA: Insufficient documentation

## 2015-06-11 ENCOUNTER — Encounter: Payer: Self-pay | Admitting: Hematology and Oncology

## 2015-06-11 ENCOUNTER — Ambulatory Visit (HOSPITAL_BASED_OUTPATIENT_CLINIC_OR_DEPARTMENT_OTHER): Payer: Medicare Other | Admitting: Hematology and Oncology

## 2015-06-11 VITALS — BP 146/57 | HR 74 | Temp 99.0°F | Resp 17

## 2015-06-11 DIAGNOSIS — N852 Hypertrophy of uterus: Secondary | ICD-10-CM | POA: Diagnosis present

## 2015-06-11 DIAGNOSIS — N289 Disorder of kidney and ureter, unspecified: Secondary | ICD-10-CM | POA: Diagnosis not present

## 2015-06-11 NOTE — Progress Notes (Signed)
[place] NOTE  Patient Care Team: Nolene Ebbs, MD as PCP - General (Internal Medicine)  CHIEF COMPLAINTS/PURPOSE OF CONSULTATION:  Kidney lesions and uterine hypertrophy  HISTORY OF PRESENTING ILLNESS:  Judith [name] 75 y.o. female is here because of recent diagnosis of ilateral kidney lesions and uterine hypertrophy. Patient was in the emergency room with complaints of lower abdominal pain and diarrhea. She underwent CT of the abdomen and pelvis which revealed uterine hypertrophyconcentrically uterine cancer as well as renal lesionsthat were solid and concerning for kidney cancer. She was referred to was for further evaluation of both of these lesions. Patient is in a wheelchair with right above-knee amputation. She does move around and didn't go to shopping with some assistance.  I reviewed her records extensively and collaborated the history with the patient.  MEDICAL HISTORY:  Past Medical History  Diagnosis Date  . Insulin dependent diabetes mellitus ([place]) 03/01/2012  . Closed right ankle fracture   . Hyperlipidemia   . Fracture of femur, distal, right, closed ([place]) 03/01/2012  . Obesity (BMI 30-39.9) 03/03/2012  . Hemorrhoid   . Asthma   . Pneumonia     "once" (05/17/2013)  . History of stomach ulcers   . GERD (gastroesophageal reflux disease)   . [id number]     "probably weekly" (05/17/2013)  . 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 Arthritis     "left leg" (05/17/2013)  . Proctocolitis ([place]) 09/04/02  . Colitis 09/06/02  . Diabetes mellitus without complication ([place])   . High cholesterol   . Hypertension   . Anxiety     SURGICAL HISTORY: Past Surgical History  Procedure Laterality Date  . 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 Orif femur fracture  03/01/2012    Procedure: OPEN REDUCTION INTERNAL FIXATION (ORIF) DISTAL FEMUR FRACTURE;  Surgeon: Rozanna Box, MD;  Location: [place];  Service: Orthopedics;  Laterality: Right;  ORIF  right femur  . Cataract extraction w/ intraocular lens  implant, bilateral Bilateral   . Breast lumpectomy Bilateral     "not cancer"  . Abdominal hysterectomy      SOCIAL HISTORY: Social History   Social History  . Marital Status: Single    Spouse Name: N/A  . Number of Children: 7  . Years of Education: N/A   Occupational History  . Retired    Social History Main Topics  . Smoking status: Former Smoker -- 1.00 packs/day for 5 years    Quit date: 01/26/1993  . Smokeless tobacco: Not on file     Comment: "quit smoking at ~ age 89"  . Alcohol Use: No  . Drug Use: No  . Sexual Activity: No   Other Topics Concern  . Not on file   Social History Narrative   ** Merged History Encounter **       Pt lives at home with son   Uses prosthesis on L BKA with walker   Fairly independent    FAMILY HISTORY: Family History  Problem Relation Age of Onset  . Diabetes Mother   . Diabetes Father   . Diabetes Sister     x3  . Diabetes Brother     x4  . Colon cancer Neg Hx   . Colon polyps Neg Hx     ALLERGIES:  has No Known Allergies.  MEDICATIONS:  Current Outpatient Prescriptions  Medication Sig Dispense Refill  . aspirin 325 MG tablet Take 325 mg by mouth  daily.    . aspirin EC 81 MG tablet Take 81 mg by mouth daily.    Marland Kitchen atorvastatin (LIPITOR) 20 MG tablet Take 20 mg by mouth daily.    . chlorhexidine (PERIDEX) 0.12 % solution Use as directed 15 mLs in the mouth or throat 2 (two) times daily. 15 ml by mouth twice daily for mouth sores after brushing teeth. Swish for seconds. Do Not rinse mouth or brush teeth after use.    . cholecalciferol (VITAMIN D) 1000 UNITS tablet Take 1,000 Units by mouth daily.    . cloNIDine (CATAPRES) 0.1 MG tablet Take 0.05 mg by mouth 2 (two) times daily.    . cloNIDine (CATAPRES) 0.1 MG tablet Take 0.1 mg by mouth 2 (two) times daily.    Marland Kitchen docusate sodium (COLACE) 100 MG capsule Take 1 capsule (100 mg total) by mouth every 12 (twelve) hours.  60 capsule 0  . feeding supplement, GLUCERNA SHAKE, (GLUCERNA SHAKE) LIQD Take 237 mLs by mouth 3 (three) times daily with meals. 90 Can 0  . glipiZIDE (GLUCOTROL XL) 5 MG 24 hr tablet Take 5 mg by mouth 2 (two) times daily.    . hydrocerin (EUCERIN) CREA Apply 1 application topically 2 (two) times daily. Applied to bilateral legs twice a day    . hydrocerin (EUCERIN) CREA Apply 1 application topically as needed (application to bilateral legs as needed (in addition to the scheduled twice a day applications)).    . hydrocortisone cream 1 % Apply 1 application topically 3 (three) times daily as needed (applied topically to hemorrhoids as needed for irritation/pain).    . insulin aspart (NOVOLOG) 100 UNIT/ML injection Inject 0-15 Units into the skin 3 (three) times daily before meals. 0-150 = 0 units 151-200 = 3 units 201-250 = 5 units 251-300 = 7 units 301-350 = 9 units 351-400 = 11 units 401-450 = 13 units 451-500 = 15 units and call provider    . insulin detemir (LEVEMIR) 100 UNIT/ML injection Inject 0.1 mLs (10 Units total) into the skin daily with lunch. 10 mL 11  . insulin glargine (LANTUS) 100 UNIT/ML injection Inject 15 Units into the skin at bedtime.     Marland Kitchen lisinopril (PRINIVIL,ZESTRIL) 40 MG tablet Take 40 mg by mouth daily.    Marland Kitchen loperamide (IMODIUM) 2 MG capsule Take 2-4 mg by mouth as needed for diarrhea or loose stools (take 2 tablets by mouth after 1st loose stool and 1 by mouth after each additional loose stool.  max 8 mg in 24 hours.).    Marland Kitchen LORazepam (ATIVAN) 0.5 MG tablet Take 1 tablet (0.5 mg total) by mouth every 6 (six) hours as needed for anxiety. 8 tablet 0  . LORazepam (ATIVAN) 0.5 MG tablet Take 0.5 mg by mouth every 6 (six) hours as needed for anxiety.    . magic mouthwash SOLN Take 15 mLs by mouth 2 (two) times daily.    . magnesium hydroxide (MILK OF MAGNESIA) 400 MG/5ML suspension Take 30 mLs by mouth daily as needed (for constipation if no BM in 3 days).    . metFORMIN  (GLUCOPHAGE) 500 MG tablet Take 1 tablet (500 mg total) by mouth 2 (two) times daily with a meal. 30 tablet 0  . metoprolol succinate (TOPROL-XL) 50 MG 24 hr tablet Take 50 mg by mouth daily. Take with or immediately following a meal.    . Multiple Vitamin (MULTIVITAMIN WITH MINERALS) TABS Take 1 tablet by mouth daily.    . polyethylene glycol (MIRALAX /  GLYCOLAX) packet Take 17 g by mouth daily. 14 each 0  . polyethylene glycol (MIRALAX / GLYCOLAX) packet Take 17 g by mouth daily. 14 each 0  . ranitidine (ZANTAC) 150 MG tablet Take 150 mg by mouth at bedtime.    . senna-docusate (SENOKOT-S) 8.6-50 MG per tablet Take 2 tablets by mouth 2 (two) times daily. 60 tablet 0  . simvastatin (ZOCOR) 40 MG tablet Take 1 tablet (40 mg total) by mouth every evening. 30 tablet 1  . SODIUM CHLORIDE PO Take 30 mLs by mouth as directed. Gargle mouth with warm salt water twice daily for mouth pain.     No current facility-administered medications for this visit.    REVIEW OF SYSTEMS:   Constitutional: Denies fevers, chills or abnormal night sweats Eyes: Denies blurriness of vision, double vision or watery eyes Ears, nose, mouth, throat, and face: Denies mucositis or sore throat Respiratory: Denies cough, dyspnea or wheezes Cardiovascular: Denies palpitation, chest discomfort or lower extremity swelling Gastrointestinal:  Denies nausea, heartburn or change in bowel habits Skin: Denies abnormal skin rashes Lymphatics: Denies new lymphadenopathy or easy bruising Neurological:Denies numbness, tingling or new weaknesses Behavioral/Psych: Mood is stable, no new changes  Right above knee amputation All other systems were reviewed with the patient and are negative.  PHYSICAL EXAMINATION: ECOG PERFORMANCE STATUS: 2 - Symptomatic, <50% confined to bed  Filed Vitals:   06/11/15 1237  BP: 146/57  Pulse: 74  Temp: 99 F (37.2 C)  Resp: 17   Filed Weights    GENERAL:alert, no distress and  comfortable SKIN: skin color, texture, turgor are normal, no rashes or significant lesions EYES: normal, conjunctiva are pink and non-injected, sclera clear OROPHARYNX:no exudate, no erythema and lips, buccal mucosa, and tongue normal  NECK: supple, thyroid normal size, non-tender, without nodularity LYMPH:  no palpable lymphadenopathy in the cervical, axillary or inguinal LUNGS: clear to auscultation and percussion with normal breathing effort HEART: regular rate & rhythm and no murmurs and no lower extremity edema ABDOMEN:abdomen soft, non-tender and normal bowel sounds Musculoskeletal:no cyanosis of digits and no clubbing  PSYCH: alert & oriented x 3 with fluent speech NEURO: no focal motor/sensory deficits  LABORATORY DATA:  I have reviewed the data as listed Lab Results  Component Value Date   WBC 6.7 05/25/2015   HGB 11.4* 05/25/2015   HCT 35.7* 05/25/2015   MCV 82.3 05/25/2015   PLT 238 05/25/2015   Lab Results  Component Value Date   NA 143 05/25/2015   K 4.2 05/25/2015   CL 110 05/25/2015   CO2 23 05/25/2015    RADIOGRAPHIC STUDIES: I have personally reviewed the radiological reports and agreed with the findings in the report.  ASSESSMENT AND PLAN:  Uterine hypertrophy Uterine hypertrophy was detected in the emergency room when she presented with lower abdominal painand CT of the abdomen was performed. Patient does not have any bleeding symptoms from the vaginal area.  Recommendation: Consult GYN oncology for assessment and biopsies.  Kidney lesion, native, bilateral For kidney lesions on the right and one lesion on the left solid in appearance concerning for a kidney cancer.I will refer the patient to urology. She may need a renal MRI as recommended by the CT scan. I would like to see what the urologist since before ordering further scans.  I need to see the patient back only if she hasknown diagnosis of malignancy. Patient understands that most of the approaches  to treatment of both uterine and kidney cancers have been  to do with surgeries as primary modality of treatment.   All questions were answered. The patient knows to call the clinic with any problems, questions or concerns.    Rulon Eisenmenger, MD 06/11/2015

## 2015-06-11 NOTE — Assessment & Plan Note (Signed)
For kidney lesions on the right and one lesion on the left solid in appearance concerning for a kidney cancer.I will refer the patient to urology. She may need a renal MRI as recommended by the CT scan. I would like to see what the urologist since before ordering further scans.  I need to see the patient back only if she hasknown diagnosis of malignancy. Patient understands that most of the approaches to treatment of both uterine and kidney cancers have been to do with surgeries as primary modality of treatment.

## 2015-06-11 NOTE — Assessment & Plan Note (Signed)
Uterine hypertrophy was detected in the emergency room when she presented with lower abdominal painand CT of the abdomen was performed. Patient does not have any bleeding symptoms from the vaginal area.  Recommendation: Consult GYN oncology for assessment and biopsies.

## 2015-06-12 ENCOUNTER — Telehealth: Payer: Self-pay | Admitting: Hematology and Oncology

## 2015-06-12 NOTE — Telephone Encounter (Signed)
Faxed pt medical records to [facility] [phone]

## 2015-06-18 DIAGNOSIS — E11621 Type 2 diabetes mellitus with foot ulcer: Secondary | ICD-10-CM | POA: Diagnosis not present

## 2015-06-28 ENCOUNTER — Ambulatory Visit: Payer: No Typology Code available for payment source | Admitting: Gynecology

## 2015-06-28 ENCOUNTER — Other Ambulatory Visit: Payer: Self-pay | Admitting: Urology

## 2015-06-28 DIAGNOSIS — C641 Malignant neoplasm of right kidney, except renal pelvis: Secondary | ICD-10-CM

## 2015-06-28 DIAGNOSIS — C642 Malignant neoplasm of left kidney, except renal pelvis: Secondary | ICD-10-CM

## 2015-07-02 ENCOUNTER — Encounter (HOSPITAL_BASED_OUTPATIENT_CLINIC_OR_DEPARTMENT_OTHER): Payer: Medicare Other | Attending: Surgery

## 2015-07-02 DIAGNOSIS — L97421 Non-pressure chronic ulcer of left heel and midfoot limited to breakdown of skin: Secondary | ICD-10-CM | POA: Insufficient documentation

## 2015-07-02 DIAGNOSIS — E1141 Type 2 diabetes mellitus with diabetic mononeuropathy: Secondary | ICD-10-CM | POA: Insufficient documentation

## 2015-07-02 DIAGNOSIS — E11621 Type 2 diabetes mellitus with foot ulcer: Secondary | ICD-10-CM | POA: Insufficient documentation

## 2015-07-02 DIAGNOSIS — Z89511 Acquired absence of right leg below knee: Secondary | ICD-10-CM | POA: Insufficient documentation

## 2015-07-02 DIAGNOSIS — I10 Essential (primary) hypertension: Secondary | ICD-10-CM | POA: Insufficient documentation

## 2015-07-02 DIAGNOSIS — J449 Chronic obstructive pulmonary disease, unspecified: Secondary | ICD-10-CM | POA: Insufficient documentation

## 2015-07-11 ENCOUNTER — Ambulatory Visit (HOSPITAL_COMMUNITY)
Admission: RE | Admit: 2015-07-11 | Discharge: 2015-07-11 | Disposition: A | Discharge status: Home / Self Care | Payer: Medicare Other | Source: Ambulatory Visit | Attending: Urology | Admitting: Urology

## 2015-07-11 DIAGNOSIS — C642 Malignant neoplasm of left kidney, except renal pelvis: Secondary | ICD-10-CM | POA: Diagnosis not present

## 2015-07-11 DIAGNOSIS — C641 Malignant neoplasm of right kidney, except renal pelvis: Secondary | ICD-10-CM | POA: Insufficient documentation

## 2015-07-11 LAB — POCT I-STAT CREATININE: Creatinine, Ser: 0.9 mg/dL (ref 0.44–1.00)

## 2015-07-11 MED ORDER — GADOBENATE DIMEGLUMINE 529 MG/ML IV SOLN
20.0000 mL | Freq: Once | INTRAVENOUS | Status: AC | PRN
  Administered 2015-07-11: 16 mL via INTRAVENOUS

## 2015-07-15 ENCOUNTER — Ambulatory Visit: Payer: Medicare Other | Attending: Gynecologic Oncology | Admitting: Gynecologic Oncology

## 2015-07-15 ENCOUNTER — Encounter: Payer: Self-pay | Admitting: Gynecologic Oncology

## 2015-07-15 VITALS — BP 143/85 | HR 85 | Temp 98.0°F | Resp 18

## 2015-07-15 DIAGNOSIS — Z9842 Cataract extraction status, left eye: Secondary | ICD-10-CM | POA: Insufficient documentation

## 2015-07-15 DIAGNOSIS — F419 Anxiety disorder, unspecified: Secondary | ICD-10-CM | POA: Insufficient documentation

## 2015-07-15 DIAGNOSIS — Z90710 Acquired absence of both cervix and uterus: Secondary | ICD-10-CM | POA: Diagnosis not present

## 2015-07-15 DIAGNOSIS — Z79899 Other long term (current) drug therapy: Secondary | ICD-10-CM | POA: Diagnosis not present

## 2015-07-15 DIAGNOSIS — N852 Hypertrophy of uterus: Secondary | ICD-10-CM | POA: Insufficient documentation

## 2015-07-15 DIAGNOSIS — Z7984 Long term (current) use of oral hypoglycemic drugs: Secondary | ICD-10-CM | POA: Insufficient documentation

## 2015-07-15 DIAGNOSIS — Z833 Family history of diabetes mellitus: Secondary | ICD-10-CM | POA: Diagnosis not present

## 2015-07-15 DIAGNOSIS — Z8371 Family history of colonic polyps: Secondary | ICD-10-CM | POA: Diagnosis not present

## 2015-07-15 DIAGNOSIS — Z89431 Acquired absence of right foot: Secondary | ICD-10-CM | POA: Diagnosis not present

## 2015-07-15 DIAGNOSIS — Z794 Long term (current) use of insulin: Secondary | ICD-10-CM | POA: Insufficient documentation

## 2015-07-15 DIAGNOSIS — E11610 Type 2 diabetes mellitus with diabetic neuropathic arthropathy: Secondary | ICD-10-CM | POA: Diagnosis not present

## 2015-07-15 DIAGNOSIS — Z6830 Body mass index (BMI) 30.0-30.9, adult: Secondary | ICD-10-CM | POA: Diagnosis not present

## 2015-07-15 DIAGNOSIS — N289 Disorder of kidney and ureter, unspecified: Secondary | ICD-10-CM | POA: Insufficient documentation

## 2015-07-15 DIAGNOSIS — E7800 Pure hypercholesterolemia, unspecified: Secondary | ICD-10-CM | POA: Insufficient documentation

## 2015-07-15 DIAGNOSIS — Z87891 Personal history of nicotine dependence: Secondary | ICD-10-CM | POA: Diagnosis not present

## 2015-07-15 DIAGNOSIS — Z9841 Cataract extraction status, right eye: Secondary | ICD-10-CM | POA: Diagnosis not present

## 2015-07-15 DIAGNOSIS — Z7982 Long term (current) use of aspirin: Secondary | ICD-10-CM | POA: Insufficient documentation

## 2015-07-15 DIAGNOSIS — Z8719 Personal history of other diseases of the digestive system: Secondary | ICD-10-CM | POA: Insufficient documentation

## 2015-07-15 DIAGNOSIS — E1165 Type 2 diabetes mellitus with hyperglycemia: Secondary | ICD-10-CM | POA: Diagnosis not present

## 2015-07-15 DIAGNOSIS — Z800 Family history of malignant neoplasm of digestive organs: Secondary | ICD-10-CM | POA: Diagnosis not present

## 2015-07-15 DIAGNOSIS — F0390 Unspecified dementia without behavioral disturbance: Secondary | ICD-10-CM | POA: Insufficient documentation

## 2015-07-15 DIAGNOSIS — Z89511 Acquired absence of right leg below knee: Secondary | ICD-10-CM | POA: Diagnosis not present

## 2015-07-15 DIAGNOSIS — K219 Gastro-esophageal reflux disease without esophagitis: Secondary | ICD-10-CM | POA: Insufficient documentation

## 2015-07-15 DIAGNOSIS — I10 Essential (primary) hypertension: Secondary | ICD-10-CM | POA: Insufficient documentation

## 2015-07-15 DIAGNOSIS — E669 Obesity, unspecified: Secondary | ICD-10-CM | POA: Insufficient documentation

## 2015-07-15 NOTE — Patient Instructions (Addendum)
Plan to have an ultrasound.  Arrive with a full bladder.  We will contact you with the results.

## 2015-07-15 NOTE — Progress Notes (Signed)
Consult Note: Gyn-Onc  Consult was requested by Dr. Sonny Dandy for the evaluation of Judith [name] 75 y.o. female  CC:  Chief Complaint  Patient presents with  . Uterine Hypertrophy    New Consultation    Assessment/Plan:  Judith [name]  is a 75 y.o.  year old with uterine hypertrophy on CT imaging in the setting of dementia, poorly controlled DM and multiple medical comorbidities.  The patient is asymptomatic (no bleeding).   She additionally has multiple renal lesions on imaging also receiving further work-up.  I discussed with the patient and her daughter in law, [name], the findings from the CT scan and we reviewed the images. I discussed that it is extremely rare for uterine cancer to present without vaginal bleeding. She likely has some cervical stenosis which would be consistent with exam findings and this is causing retained fluid/mucous in the uterus. Because we were unsuccessful in our attempts to biopsy the uterus due to the cervix being flush with the vagina and bulky stool in the rectum, we will instead order a TV US. If this appears non-concerning for malignancy, then no further work-up is required. If this demonstrates thickened endometrium concerning for malignancy, we would consider a D&C in the OR. I discussed with [name] briefly if this might be something that she would be interested in and she is uncertain of this, because she may not be interested in pursuing treatment for an asymptomatic uterine cancer should one exist, and therefore is reluctant to undergo invasive procedures to diagnose one.  We will contact [name] with the [place] results and plan.  HPI: Judith [name] is a 75 year old [id number] who is seen in consultation at the request of Dr Sonny Dandy for a hypertrophied uterus. It was seen in the emergency room on 05/26/2015 for abdominal pain. Final diagnosis was rectal impaction of stool. During that ER visit she underwent CT scan of the abdomen and pelvis with IV contrast. This  revealed 4 right and 1 left renal lesions which appeared solid neoplastic. It also revealed a thickened appearance of the endometrium with internal fluid and an irregularly marginated endometrium. The patient reports no vaginal bleeding. Her history states a prior abdominal hysterectomy though she is completely unclear of the circumstances surrounding this and is unlikely that this took place. Of note the patient has dementia and is a poor historian and most history is provided by her daughter in law.  The patient also has a right below-knee amputation, poorly controlled diabetes mellitus, Charcot joint in the left foot. She lives in a assisted living facility with maximal cyst. She's had 7 prior vaginal deliveries.    Current Meds:  Outpatient Encounter Prescriptions as of 07/15/2015  Medication Sig  . aspirin 325 MG tablet Take 325 mg by mouth daily.  Marland Kitchen aspirin EC 81 MG tablet Take 81 mg by mouth daily.  Marland Kitchen atorvastatin (LIPITOR) 20 MG tablet Take 20 mg by mouth daily.  . chlorhexidine (PERIDEX) 0.12 % solution Use as directed 15 mLs in the mouth or throat 2 (two) times daily. 15 ml by mouth twice daily for mouth sores after brushing teeth. Swish for seconds. Do Not rinse mouth or brush teeth after use.  . cholecalciferol (VITAMIN D) 1000 UNITS tablet Take 1,000 Units by mouth daily.  . cloNIDine (CATAPRES) 0.1 MG tablet Take 0.05 mg by mouth 2 (two) times daily.  . cloNIDine (CATAPRES) 0.1 MG tablet Take 0.1 mg by mouth 2 (two) times daily.  Marland Kitchen docusate  sodium (COLACE) 100 MG capsule Take 1 capsule (100 mg total) by mouth every 12 (twelve) hours.  . feeding supplement, GLUCERNA SHAKE, (GLUCERNA SHAKE) LIQD Take 237 mLs by mouth 3 (three) times daily with meals.  Marland Kitchen glipiZIDE (GLUCOTROL XL) 5 MG 24 hr tablet Take 5 mg by mouth 2 (two) times daily.  . hydrocerin (EUCERIN) CREA Apply 1 application topically 2 (two) times daily. Applied to bilateral legs twice a day  . hydrocerin (EUCERIN) CREA Apply 1  application topically as needed (application to bilateral legs as needed (in addition to the scheduled twice a day applications)).  . hydrocortisone cream 1 % Apply 1 application topically 3 (three) times daily as needed (applied topically to hemorrhoids as needed for irritation/pain).  . insulin aspart (NOVOLOG) 100 UNIT/ML injection Inject 0-15 Units into the skin 3 (three) times daily before meals. 0-150 = 0 units 151-200 = 3 units 201-250 = 5 units 251-300 = 7 units 301-350 = 9 units 351-400 = 11 units 401-450 = 13 units 451-500 = 15 units and call provider  . insulin detemir (LEVEMIR) 100 UNIT/ML injection Inject 0.1 mLs (10 Units total) into the skin daily with lunch.  . insulin glargine (LANTUS) 100 UNIT/ML injection Inject 15 Units into the skin at bedtime.   Marland Kitchen lisinopril (PRINIVIL,ZESTRIL) 40 MG tablet Take 40 mg by mouth daily.  Marland Kitchen loperamide (IMODIUM) 2 MG capsule Take 2-4 mg by mouth as needed for diarrhea or loose stools (take 2 tablets by mouth after 1st loose stool and 1 by mouth after each additional loose stool.  max 8 mg in 24 hours.).  Marland Kitchen LORazepam (ATIVAN) 0.5 MG tablet Take 1 tablet (0.5 mg total) by mouth every 6 (six) hours as needed for anxiety.  Marland Kitchen LORazepam (ATIVAN) 0.5 MG tablet Take 0.5 mg by mouth every 6 (six) hours as needed for anxiety.  . magic mouthwash SOLN Take 15 mLs by mouth 2 (two) times daily.  . magnesium hydroxide (MILK OF MAGNESIA) 400 MG/5ML suspension Take 30 mLs by mouth daily as needed (for constipation if no BM in 3 days).  . metFORMIN (GLUCOPHAGE) 500 MG tablet Take 1 tablet (500 mg total) by mouth 2 (two) times daily with a meal.  . metoprolol succinate (TOPROL-XL) 50 MG 24 hr tablet Take 50 mg by mouth daily. Take with or immediately following a meal.  . Multiple Vitamin (MULTIVITAMIN WITH MINERALS) TABS Take 1 tablet by mouth daily.  . polyethylene glycol (MIRALAX / GLYCOLAX) packet Take 17 g by mouth daily.  . polyethylene glycol (MIRALAX /  GLYCOLAX) packet Take 17 g by mouth daily.  . ranitidine (ZANTAC) 150 MG tablet Take 150 mg by mouth at bedtime.  . senna-docusate (SENOKOT-S) 8.6-50 MG per tablet Take 2 tablets by mouth 2 (two) times daily.  . simvastatin (ZOCOR) 40 MG tablet Take 1 tablet (40 mg total) by mouth every evening.  Marland Kitchen SODIUM CHLORIDE PO Take 30 mLs by mouth as directed. Gargle mouth with warm salt water twice daily for mouth pain.   No facility-administered encounter medications on file as of 07/15/2015.    Allergy: No Known Allergies  Social Hx:   Social History   Social History  . Marital Status: Single    Spouse Name: N/A  . Number of Children: 7  . Years of Education: N/A   Occupational History  . Retired    Social History Main Topics  . Smoking status: Former Smoker -- 1.00 packs/day for 5 years    Quit  date: 01/26/1993  . Smokeless tobacco: Not on file     Comment: "quit smoking at ~ age 87"  . Alcohol Use: No  . Drug Use: No  . Sexual Activity: No   Other Topics Concern  . Not on file   Social History Narrative   ** Merged History Encounter **       Pt lives at home with son   Uses prosthesis on L BKA with walker   Fairly independent    Past Surgical Hx:  Past Surgical History  Procedure Laterality Date  . 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 Orif femur fracture  03/01/2012    Procedure: OPEN REDUCTION INTERNAL FIXATION (ORIF) DISTAL FEMUR FRACTURE;  Surgeon: Rozanna Box, MD;  Location: [place];  Service: Orthopedics;  Laterality: Right;  ORIF right femur  . Cataract extraction w/ intraocular lens  implant, bilateral Bilateral   . Breast lumpectomy Bilateral     "not cancer"  . Abdominal hysterectomy      Past Medical Hx:  Past Medical History  Diagnosis Date  . Insulin dependent diabetes mellitus ([place]) 03/01/2012  . Closed right ankle fracture   . Hyperlipidemia   . Fracture of femur, distal, right, closed ([place]) 03/01/2012  . Obesity (BMI 30-39.9) 03/03/2012  .  Hemorrhoid   . Asthma   . Pneumonia     "once" (05/17/2013)  . History of stomach ulcers   . GERD (gastroesophageal reflux disease)   . [id number])     "probably weekly" (05/17/2013)  . 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)   . Arthritis     "left leg" (05/17/2013)  . Proctocolitis ([place]) 09/04/02  . Colitis 09/06/02  . Diabetes mellitus without complication ([place])   . High cholesterol   . Hypertension   . Anxiety     Past Gynecological History:  SVD x 7.   No LMP recorded. Patient is postmenopausal.  Family Hx:  Family History  Problem Relation Age of Onset  . Diabetes Mother   . Diabetes Father   . Diabetes Sister     x3  . Diabetes Brother     x4  . Colon cancer Neg Hx   . Colon polyps Neg Hx     Review of Systems:  Constitutional  Feels well,    ENT Normal appearing ears and nares bilaterally Skin/Breast  No rash, sores, jaundice, itching, dryness Cardiovascular  No chest pain, shortness of breath, or edema  Pulmonary  No cough or wheeze.  Gastro Intestinal  No nausea, vomitting, or diarrhoea. No bright red blood per rectum, no abdominal pain, change in bowel movement, + constipation.  Genito Urinary  No frequency, urgency, dysuria, no postmenopausal bleeding Musculo Skeletal  No myalgia, arthralgia, joint swelling or pain  Neurologic  No weakness, numbness, change in gait,  Psychology  No depression, anxiety, insomnia.   Vitals:  Blood pressure 143/85, pulse 85, temperature 98 F (36.7 C), temperature source Oral, resp. rate 18, SpO2 100 %.  Physical Exam: WD in NAD Neck  Supple NROM, without any enlargements.  Lymph Node Survey No cervical supraclavicular or inguinal adenopathy Cardiovascular  Pulse normal rate, regularity and rhythm. S1 and S2 normal.  Lungs  Clear to auscultation bilateraly, without wheezes/crackles/rhonchi. Good air movement.  Skin  No rash/lesions/breakdown  Psychiatry  Alert and oriented to person, place, and  time  Abdomen  Normoactive bowel sounds, abdomen soft, non-tender and mildly obese without evidence of hernia. No masses Back No CVA  tenderness Genito Urinary  Vulva/vagina: Normal external female genitalia.  No lesions. No discharge or bleeding.  Bladder/urethra:  No lesions or masses, well supported bladder  Vagina: normal, posterior wall pushed anteriorally by large stool burden in rectum.  Cervix: Not visible with speculum. Palpably flush with upper vagina. Unable to perform endometrial biopsy due to flush stenotic cervix with upper vagina.  Uterus:  Small, mobile, no parametrial involvement or nodularity.  Adnexa: no palpable masses. Rectal  Good tone, no masses no cul de sac nodularity.  Large volume of hard stool in rectum. Extremities  No bilateral cyanosis, clubbing or edema.   Donaciano Eva, MD  07/15/2015, 9:59 AM

## 2015-07-16 DIAGNOSIS — J449 Chronic obstructive pulmonary disease, unspecified: Secondary | ICD-10-CM | POA: Diagnosis not present

## 2015-07-16 DIAGNOSIS — I10 Essential (primary) hypertension: Secondary | ICD-10-CM | POA: Diagnosis not present

## 2015-07-16 DIAGNOSIS — Z89511 Acquired absence of right leg below knee: Secondary | ICD-10-CM | POA: Diagnosis not present

## 2015-07-16 DIAGNOSIS — L97421 Non-pressure chronic ulcer of left heel and midfoot limited to breakdown of skin: Secondary | ICD-10-CM | POA: Diagnosis not present

## 2015-07-16 DIAGNOSIS — E11621 Type 2 diabetes mellitus with foot ulcer: Secondary | ICD-10-CM | POA: Diagnosis not present

## 2015-07-16 DIAGNOSIS — E1141 Type 2 diabetes mellitus with diabetic mononeuropathy: Secondary | ICD-10-CM | POA: Diagnosis not present

## 2015-07-19 ENCOUNTER — Ambulatory Visit (HOSPITAL_COMMUNITY): Payer: No Typology Code available for payment source

## 2015-07-23 ENCOUNTER — Ambulatory Visit (HOSPITAL_COMMUNITY)
Admission: RE | Admit: 2015-07-23 | Discharge: 2015-07-23 | Disposition: A | Discharge status: Home / Self Care | Payer: Medicare Other | Source: Ambulatory Visit | Attending: Gynecologic Oncology | Admitting: Gynecologic Oncology

## 2015-07-23 DIAGNOSIS — N852 Hypertrophy of uterus: Secondary | ICD-10-CM | POA: Insufficient documentation

## 2015-07-23 DIAGNOSIS — R938 Abnormal findings on diagnostic imaging of other specified body structures: Secondary | ICD-10-CM | POA: Insufficient documentation

## 2015-08-06 ENCOUNTER — Telehealth: Payer: Self-pay | Admitting: Gynecologic Oncology

## 2015-08-06 ENCOUNTER — Encounter (HOSPITAL_BASED_OUTPATIENT_CLINIC_OR_DEPARTMENT_OTHER): Payer: Medicare Other | Attending: Surgery

## 2015-08-06 DIAGNOSIS — I10 Essential (primary) hypertension: Secondary | ICD-10-CM | POA: Insufficient documentation

## 2015-08-06 DIAGNOSIS — E1140 Type 2 diabetes mellitus with diabetic neuropathy, unspecified: Secondary | ICD-10-CM | POA: Diagnosis not present

## 2015-08-06 DIAGNOSIS — L97421 Non-pressure chronic ulcer of left heel and midfoot limited to breakdown of skin: Secondary | ICD-10-CM | POA: Diagnosis not present

## 2015-08-06 DIAGNOSIS — E11621 Type 2 diabetes mellitus with foot ulcer: Secondary | ICD-10-CM | POA: Insufficient documentation

## 2015-08-06 DIAGNOSIS — J449 Chronic obstructive pulmonary disease, unspecified: Secondary | ICD-10-CM | POA: Insufficient documentation

## 2015-08-06 DIAGNOSIS — M1990 Unspecified osteoarthritis, unspecified site: Secondary | ICD-10-CM | POA: Diagnosis not present

## 2015-08-06 NOTE — Telephone Encounter (Signed)
Informed [name] (daughter-in-law) of findings of thickened endometrium concerning for cancer. In order to definitively establish this diagnosis, she would require a D&C procedure under general anesthesia. The patient is asymptomatic (no bleeding).  I discussed that, given that she is asymptomatic, the only benefit of establishing a diagnosis would be to guide therapy (which would likely be chemotherapy and radiation). Given that [name] is not interested in aggressive, curative intent treatments for her mother in law with her underlying dementia and poorly controlled DM, she does not feel that there would be a great deal of benefit to subjecting her mother in law to the [facility] to establish a diagnosis of cancer at this time. I believe this is a sensible conclusion.  I discussed that if her mother in law later on develops bleeding, we could contemplate D&C and palliative course of radiation at that time to stop bleeding. This is usually very well tolerated.  [name] will discuss this further with her family and let me know if they change their minds.  Donaciano Eva, MD

## 2015-08-20 DIAGNOSIS — E11621 Type 2 diabetes mellitus with foot ulcer: Secondary | ICD-10-CM | POA: Diagnosis not present

## 2015-09-03 ENCOUNTER — Encounter (HOSPITAL_BASED_OUTPATIENT_CLINIC_OR_DEPARTMENT_OTHER): Payer: Medicare Other | Attending: Surgery

## 2015-09-03 DIAGNOSIS — E11621 Type 2 diabetes mellitus with foot ulcer: Secondary | ICD-10-CM | POA: Diagnosis not present

## 2015-09-03 DIAGNOSIS — L97411 Non-pressure chronic ulcer of right heel and midfoot limited to breakdown of skin: Secondary | ICD-10-CM | POA: Insufficient documentation

## 2015-09-03 DIAGNOSIS — E1141 Type 2 diabetes mellitus with diabetic mononeuropathy: Secondary | ICD-10-CM | POA: Insufficient documentation

## 2015-11-19 ENCOUNTER — Encounter (HOSPITAL_BASED_OUTPATIENT_CLINIC_OR_DEPARTMENT_OTHER): Payer: Medicare Other | Attending: Surgery

## 2015-11-19 DIAGNOSIS — Z794 Long term (current) use of insulin: Secondary | ICD-10-CM | POA: Insufficient documentation

## 2015-11-19 DIAGNOSIS — M19042 Primary osteoarthritis, left hand: Secondary | ICD-10-CM | POA: Insufficient documentation

## 2015-11-19 DIAGNOSIS — K5900 Constipation, unspecified: Secondary | ICD-10-CM | POA: Insufficient documentation

## 2015-11-19 DIAGNOSIS — I10 Essential (primary) hypertension: Secondary | ICD-10-CM | POA: Diagnosis not present

## 2015-11-19 DIAGNOSIS — L97521 Non-pressure chronic ulcer of other part of left foot limited to breakdown of skin: Secondary | ICD-10-CM | POA: Diagnosis not present

## 2015-11-19 DIAGNOSIS — Z87891 Personal history of nicotine dependence: Secondary | ICD-10-CM | POA: Insufficient documentation

## 2015-11-19 DIAGNOSIS — E11621 Type 2 diabetes mellitus with foot ulcer: Secondary | ICD-10-CM | POA: Diagnosis not present

## 2015-11-19 DIAGNOSIS — Z79899 Other long term (current) drug therapy: Secondary | ICD-10-CM | POA: Insufficient documentation

## 2015-11-19 DIAGNOSIS — E669 Obesity, unspecified: Secondary | ICD-10-CM | POA: Insufficient documentation

## 2015-11-19 DIAGNOSIS — Z89511 Acquired absence of right leg below knee: Secondary | ICD-10-CM | POA: Diagnosis not present

## 2015-11-19 DIAGNOSIS — E1140 Type 2 diabetes mellitus with diabetic neuropathy, unspecified: Secondary | ICD-10-CM | POA: Diagnosis not present

## 2015-11-19 DIAGNOSIS — F0390 Unspecified dementia without behavioral disturbance: Secondary | ICD-10-CM | POA: Insufficient documentation

## 2015-11-19 DIAGNOSIS — Z7982 Long term (current) use of aspirin: Secondary | ICD-10-CM | POA: Insufficient documentation

## 2015-11-19 DIAGNOSIS — M19041 Primary osteoarthritis, right hand: Secondary | ICD-10-CM | POA: Insufficient documentation

## 2015-11-19 DIAGNOSIS — Z6831 Body mass index (BMI) 31.0-31.9, adult: Secondary | ICD-10-CM | POA: Diagnosis not present

## 2015-11-26 DIAGNOSIS — E11621 Type 2 diabetes mellitus with foot ulcer: Secondary | ICD-10-CM | POA: Diagnosis not present

## 2015-12-03 ENCOUNTER — Encounter (HOSPITAL_BASED_OUTPATIENT_CLINIC_OR_DEPARTMENT_OTHER): Payer: Medicare Other | Attending: Surgery

## 2015-12-03 DIAGNOSIS — L97412 Non-pressure chronic ulcer of right heel and midfoot with fat layer exposed: Secondary | ICD-10-CM | POA: Insufficient documentation

## 2015-12-03 DIAGNOSIS — M1990 Unspecified osteoarthritis, unspecified site: Secondary | ICD-10-CM | POA: Insufficient documentation

## 2015-12-03 DIAGNOSIS — E11621 Type 2 diabetes mellitus with foot ulcer: Secondary | ICD-10-CM | POA: Insufficient documentation

## 2015-12-03 DIAGNOSIS — I10 Essential (primary) hypertension: Secondary | ICD-10-CM | POA: Insufficient documentation

## 2015-12-03 DIAGNOSIS — E1141 Type 2 diabetes mellitus with diabetic mononeuropathy: Secondary | ICD-10-CM | POA: Diagnosis not present

## 2015-12-10 DIAGNOSIS — E11621 Type 2 diabetes mellitus with foot ulcer: Secondary | ICD-10-CM | POA: Diagnosis not present

## 2015-12-17 DIAGNOSIS — E11621 Type 2 diabetes mellitus with foot ulcer: Secondary | ICD-10-CM | POA: Diagnosis not present

## 2015-12-24 DIAGNOSIS — E11621 Type 2 diabetes mellitus with foot ulcer: Secondary | ICD-10-CM | POA: Diagnosis not present

## 2016-01-07 ENCOUNTER — Encounter (HOSPITAL_BASED_OUTPATIENT_CLINIC_OR_DEPARTMENT_OTHER): Payer: Medicare Other | Attending: Surgery

## 2016-01-07 DIAGNOSIS — Z794 Long term (current) use of insulin: Secondary | ICD-10-CM | POA: Insufficient documentation

## 2016-01-07 DIAGNOSIS — I10 Essential (primary) hypertension: Secondary | ICD-10-CM | POA: Insufficient documentation

## 2016-01-07 DIAGNOSIS — E1140 Type 2 diabetes mellitus with diabetic neuropathy, unspecified: Secondary | ICD-10-CM | POA: Insufficient documentation

## 2016-01-07 DIAGNOSIS — Z89511 Acquired absence of right leg below knee: Secondary | ICD-10-CM | POA: Diagnosis not present

## 2016-01-07 DIAGNOSIS — E11621 Type 2 diabetes mellitus with foot ulcer: Secondary | ICD-10-CM | POA: Insufficient documentation

## 2016-01-07 DIAGNOSIS — L97522 Non-pressure chronic ulcer of other part of left foot with fat layer exposed: Secondary | ICD-10-CM | POA: Diagnosis not present

## 2016-01-14 DIAGNOSIS — E11621 Type 2 diabetes mellitus with foot ulcer: Secondary | ICD-10-CM | POA: Diagnosis not present

## 2016-01-28 ENCOUNTER — Encounter (HOSPITAL_BASED_OUTPATIENT_CLINIC_OR_DEPARTMENT_OTHER): Payer: Medicare Other | Attending: Surgery

## 2016-01-28 DIAGNOSIS — Z794 Long term (current) use of insulin: Secondary | ICD-10-CM | POA: Insufficient documentation

## 2016-01-28 DIAGNOSIS — E1141 Type 2 diabetes mellitus with diabetic mononeuropathy: Secondary | ICD-10-CM | POA: Diagnosis not present

## 2016-01-28 DIAGNOSIS — Z89511 Acquired absence of right leg below knee: Secondary | ICD-10-CM | POA: Insufficient documentation

## 2016-01-28 DIAGNOSIS — E11621 Type 2 diabetes mellitus with foot ulcer: Secondary | ICD-10-CM | POA: Insufficient documentation

## 2016-01-28 DIAGNOSIS — L97412 Non-pressure chronic ulcer of right heel and midfoot with fat layer exposed: Secondary | ICD-10-CM | POA: Insufficient documentation

## 2016-01-28 DIAGNOSIS — I10 Essential (primary) hypertension: Secondary | ICD-10-CM | POA: Diagnosis not present

## 2016-02-04 DIAGNOSIS — E11621 Type 2 diabetes mellitus with foot ulcer: Secondary | ICD-10-CM | POA: Diagnosis not present

## 2016-02-11 DIAGNOSIS — E11621 Type 2 diabetes mellitus with foot ulcer: Secondary | ICD-10-CM | POA: Diagnosis not present

## 2016-02-18 DIAGNOSIS — E11621 Type 2 diabetes mellitus with foot ulcer: Secondary | ICD-10-CM | POA: Diagnosis not present

## 2016-04-19 ENCOUNTER — Emergency Department (HOSPITAL_COMMUNITY): Payer: Medicare Other

## 2016-04-19 ENCOUNTER — Encounter (HOSPITAL_COMMUNITY): Payer: Self-pay | Admitting: *Deleted

## 2016-04-19 ENCOUNTER — Emergency Department (HOSPITAL_COMMUNITY)
Admission: EM | Admit: 2016-04-19 | Discharge: 2016-04-19 | Disposition: A | Discharge status: Home / Self Care | Payer: Medicare Other | Attending: Emergency Medicine | Admitting: Emergency Medicine

## 2016-04-19 DIAGNOSIS — E860 Dehydration: Secondary | ICD-10-CM | POA: Diagnosis not present

## 2016-04-19 DIAGNOSIS — Z794 Long term (current) use of insulin: Secondary | ICD-10-CM | POA: Insufficient documentation

## 2016-04-19 DIAGNOSIS — Z79899 Other long term (current) drug therapy: Secondary | ICD-10-CM | POA: Diagnosis not present

## 2016-04-19 DIAGNOSIS — J45909 Unspecified asthma, uncomplicated: Secondary | ICD-10-CM | POA: Insufficient documentation

## 2016-04-19 DIAGNOSIS — E1165 Type 2 diabetes mellitus with hyperglycemia: Secondary | ICD-10-CM | POA: Insufficient documentation

## 2016-04-19 DIAGNOSIS — Z87891 Personal history of nicotine dependence: Secondary | ICD-10-CM | POA: Insufficient documentation

## 2016-04-19 DIAGNOSIS — R739 Hyperglycemia, unspecified: Secondary | ICD-10-CM

## 2016-04-19 DIAGNOSIS — Z7982 Long term (current) use of aspirin: Secondary | ICD-10-CM | POA: Diagnosis not present

## 2016-04-19 LAB — BASIC METABOLIC PANEL
ANION GAP: 10 (ref 5–15)
BUN: 24 mg/dL — ABNORMAL HIGH (ref 6–20)
CALCIUM: 9.4 mg/dL (ref 8.9–10.3)
CO2: 23 mmol/L (ref 22–32)
Chloride: 105 mmol/L (ref 101–111)
Creatinine, Ser: 1.13 mg/dL — ABNORMAL HIGH (ref 0.44–1.00)
GFR calc non Af Amer: 46 mL/min — ABNORMAL LOW (ref >60)
GFR, EST AFRICAN AMERICAN: 53 mL/min — AB (ref >60)
Glucose, Bld: 239 mg/dL — ABNORMAL HIGH (ref 65–99)
POTASSIUM: 4 mmol/L (ref 3.5–5.1)
SODIUM: 138 mmol/L (ref 135–145)

## 2016-04-19 LAB — URINALYSIS, ROUTINE W REFLEX MICROSCOPIC
BILIRUBIN URINE: NEGATIVE
Glucose, UA: >=500 mg/dL — AB
Ketones, ur: NEGATIVE mg/dL
Leukocytes, UA: NEGATIVE
Nitrite: NEGATIVE
PROTEIN: NEGATIVE mg/dL
Specific Gravity, Urine: 1.025 (ref 1.005–1.030)
pH: 5 (ref 5.0–8.0)

## 2016-04-19 LAB — CBC
HCT: 35.9 % — ABNORMAL LOW (ref 36.0–46.0)
HEMOGLOBIN: 11.5 g/dL — AB (ref 12.0–15.0)
MCH: 26.6 pg (ref 26.0–34.0)
MCHC: 32 g/dL (ref 30.0–36.0)
MCV: 83.1 fL (ref 78.0–100.0)
Platelets: 262 10*3/uL (ref 150–400)
RBC: 4.32 MIL/uL (ref 3.87–5.11)
RDW: 14.6 % (ref 11.5–15.5)
WBC: 6.4 10*3/uL (ref 4.0–10.5)

## 2016-04-19 LAB — CBG MONITORING, ED
GLUCOSE-CAPILLARY: 232 mg/dL — AB (ref 65–99)
Glucose-Capillary: 105 mg/dL — ABNORMAL HIGH (ref 65–99)

## 2016-04-19 MED ORDER — SODIUM CHLORIDE 0.9 % IV BOLUS (SEPSIS)
1000.0000 mL | Freq: Once | INTRAVENOUS | Status: AC
  Administered 2016-04-19: 1000 mL via INTRAVENOUS

## 2016-04-19 NOTE — ED Provider Notes (Signed)
[facility] DEPT Provider Note   CSN: [id number] Arrival date & time: 04/19/16  1712     History   Chief Complaint Chief Complaint  Patient presents with  . Hyperglycemia    HPI Judith [name] is a 76 y.o. female.  HPI   27 YOF presents today today via EMS from nursing facility with complaints of altered mental status.  Family notes they were contacted reporting the patient's blood sugar was elevated, they note a significant history of the same when patient was experiencing urinary tract infection.  Patient's chart reports she has dementia, family notes she does not have dementia but has memory issues.  They note that she is slightly off of her baseline, but no acute distress.   Past Medical History:  Diagnosis Date  . Anxiety   . Arthritis    "left leg" (05/17/2013)  . Asthma   . 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 Closed right ankle fracture   . Colitis 09/06/02  . Diabetes mellitus without complication ([place])   . Fracture of femur, distal, right, closed ([place]) 03/01/2012  . GERD (gastroesophageal reflux disease)   . IOEVOJJK(093.8)    "probably weekly" (05/17/2013)  . Hemorrhoid   . High cholesterol   . History of stomach ulcers   . Hyperlipidemia   . Hypertension   . Insulin dependent diabetes mellitus ([place]) 03/01/2012  . Obesity (BMI 30-39.9) 03/03/2012  . Pneumonia    "once" (05/17/2013)  . Proctocolitis 09/04/02    Patient Active Problem List   Diagnosis Date Noted  . Uterine hypertrophy 06/11/2015  . Kidney lesion, native, bilateral 06/11/2015  . Protein-calorie malnutrition, severe ([place]) 09/22/2013  . Dementia 09/21/2013  . Constipation 09/21/2013  . Rectal bleeding 09/21/2013  . Fecal impaction ([place]) 09/21/2013  . BRBPR (bright red blood per rectum) 09/21/2013  . Altered mental status 05/17/2013  . Altered mental state 05/17/2013  . Delirium due to another medical condition 02/18/2013  . Acute blood loss anemia 03/03/2012  . 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 Fall 03/03/2012  . Obesity (BMI 30-39.9) 03/03/2012  . Poor nutrition 03/03/2012  . Fracture of femur, distal, right, closed ([place]) 03/01/2012  . UTI (lower urinary tract infection) 03/01/2012  . Insulin dependent diabetes mellitus ([place]) 03/01/2012  . Hypertension     Past Surgical History:  Procedure Laterality Date  . ABDOMINAL HYSTERECTOMY    . BELOW KNEE LEG AMPUTATION Right 02/2008  . BREAST LUMPECTOMY Bilateral    "not cancer"  . CATARACT EXTRACTION W/ INTRAOCULAR LENS  IMPLANT, BILATERAL Bilateral   . ORIF FEMUR FRACTURE  03/01/2012   Procedure: OPEN REDUCTION INTERNAL FIXATION (ORIF) DISTAL FEMUR FRACTURE;  Surgeon: Rozanna Box, MD;  Location: [facility];  Service: Orthopedics;  Laterality: Right;  ORIF right femur    OB History    Gravida Para Term Preterm AB Living   0 0 0 0 0     SAB TAB Ectopic Multiple Live Births   0 0 0           Home Medications    Prior to Admission medications   Medication Sig Start Date End Date Taking? Authorizing Provider  aspirin 325 MG tablet Take 325 mg by mouth daily.   Yes Historical Provider, MD  atorvastatin (LIPITOR) 20 MG tablet Take 20 mg by mouth daily.   Yes Historical Provider, MD  chlorhexidine (PERIDEX) 0.12 % solution Use as directed 15 mLs in the mouth or throat 2 (  two) times daily. 15 ml by mouth twice daily for mouth sores after brushing teeth. Swish for seconds. Do Not rinse mouth or brush teeth after use.   Yes Historical Provider, MD  cloNIDine (CATAPRES) 0.1 MG tablet Take 0.1 mg by mouth 2 (two) times daily.   Yes Historical Provider, MD  docusate sodium (COLACE) 100 MG capsule Take 1 capsule (100 mg total) by mouth every 12 (twelve) hours. 05/26/15  Yes Orpah Greek, MD  glipiZIDE (GLUCOTROL XL) 5 MG 24 hr tablet Take 5 mg by mouth 2 (two) times daily.   Yes Historical Provider, MD  hydrocerin (EUCERIN) CREA Apply 1 application topically 2 (two) times daily. Applied to bilateral legs twice a  day   Yes Historical Provider, MD  insulin aspart (NOVOLOG) 100 UNIT/ML injection Inject 0-15 Units into the skin 3 (three) times daily before meals. 0-150 = 0 units 151-200 = 3 units 201-250 = 5 units 251-300 = 7 units 301-350 = 9 units 351-400 = 11 units 401-450 = 13 units 451-500 = 15 units and call provider   Yes Historical Provider, MD  insulin glargine (LANTUS) 100 UNIT/ML injection Inject 18 Units into the skin at bedtime.    Yes Historical Provider, MD  lisinopril (PRINIVIL,ZESTRIL) 40 MG tablet Take 40 mg by mouth daily.   Yes Historical Provider, MD  metoprolol succinate (TOPROL-XL) 50 MG 24 hr tablet Take 50 mg by mouth daily. Take with or immediately following a meal.   Yes Historical Provider, MD  polyethylene glycol (MIRALAX / GLYCOLAX) packet Take 17 g by mouth daily. 09/22/13  Yes [place], PA-C  ranitidine (ZANTAC) 150 MG tablet Take 150 mg by mouth at bedtime.   Yes Historical Provider, MD  feeding supplement, GLUCERNA SHAKE, (GLUCERNA SHAKE) LIQD Take 237 mLs by mouth 3 (three) times daily with meals. Patient not taking: Reported on 04/19/2016 09/22/13   Jonetta Osgood, MD  insulin detemir (LEVEMIR) 100 UNIT/ML injection Inject 0.1 mLs (10 Units total) into the skin daily with lunch. Patient not taking: Reported on 04/19/2016 09/22/13   Waldon Merl, PA-C  LORazepam (ATIVAN) 0.5 MG tablet Take 1 tablet (0.5 mg total) by mouth every 6 (six) hours as needed for anxiety. Patient not taking: Reported on 04/19/2016 04/28/14   Larence Penning, MD  magic mouthwash SOLN Take 15 mLs by mouth 2 (two) times daily.    Historical Provider, MD  senna-docusate (SENOKOT-S) 8.6-50 MG per tablet Take 2 tablets by mouth 2 (two) times daily. Patient not taking: Reported on 04/19/2016 09/22/13   Waldon Merl, PA-C    Family History Family History  Problem Relation Age of Onset  . Diabetes Mother   . Diabetes Father   . Diabetes Sister     x3  . Diabetes Brother     x4  . Colon cancer Neg  Hx   . Colon polyps Neg Hx     Social History Social History  Substance Use Topics  . Smoking status: Former Smoker    Packs/day: 1.00    Years: 5.00    Quit date: 01/26/1993  . Smokeless tobacco: Former Systems developer     Comment: "quit smoking at ~ age 67"  . Alcohol use No     Allergies   Patient has no known allergies.   Review of Systems Review of Systems  All other systems reviewed and are negative.    Physical Exam Updated Vital Signs BP (!) 109/44   Pulse 74   Temp  98.9 F (37.2 C) (Oral)   Resp (!) 21   SpO2 98%   Physical Exam  Constitutional: She appears well-developed and well-nourished.  HENT:  Head: Normocephalic and atraumatic.  Eyes: Conjunctivae are normal. Pupils are equal, round, and reactive to light. Right eye exhibits no discharge. Left eye exhibits no discharge. No scleral icterus.  Neck: Normal range of motion. No JVD present. No tracheal deviation present.  Cardiovascular: Normal rate and regular rhythm.   Pulmonary/Chest: Effort normal and breath sounds normal. No stridor. No respiratory distress. She has no wheezes. She has no rales. She exhibits no tenderness.  Musculoskeletal: She exhibits no edema.  Neurological: She is alert. Coordination normal.  Skin: Skin is warm.  Psychiatric: She has a normal mood and affect. Her behavior is normal. Judgment and thought content normal.  Nursing note and vitals reviewed.    ED Treatments / Results  Labs (all labs ordered are listed, but only abnormal results are displayed) Labs Reviewed  BASIC METABOLIC PANEL - Abnormal; Notable for the following:       Result Value   Glucose, Bld 239 (*)    BUN 24 (*)    Creatinine, Ser 1.13 (*)    GFR calc non Af Amer 46 (*)    GFR calc Af Amer 53 (*)    All other components within normal limits  CBC - Abnormal; Notable for the following:    Hemoglobin 11.5 (*)    HCT 35.9 (*)    All other components within normal limits  URINALYSIS, ROUTINE W REFLEX  MICROSCOPIC - Abnormal; Notable for the following:    Glucose, UA >=500 (*)    Hgb urine dipstick SMALL (*)    Bacteria, UA MANY (*)    Squamous Epithelial / LPF 0-5 (*)    All other components within normal limits  CBG MONITORING, ED - Abnormal; Notable for the following:    Glucose-Capillary 232 (*)    All other components within normal limits  CBG MONITORING, ED - Abnormal; Notable for the following:    Glucose-Capillary 105 (*)    All other components within normal limits  URINE CULTURE    EKG  EKG Interpretation None       Radiology Dg Chest 2 View  Result Date: 04/19/2016 CLINICAL DATA:  Altered mental status, hyperglycemia EXAM: CHEST  2 VIEW COMPARISON:  03/05/2014 FINDINGS: Cardiomediastinal silhouette is stable. Mild basilar atelectasis again noted. Osteopenia and mild degenerative changes thoracic spine. No infiltrate or pulmonary edema. IMPRESSION: No active cardiopulmonary disease. Stable bilateral basilar atelectasis or scarring. Electronically Signed   By: Lahoma Crocker M.D.   On: 04/19/2016 18:57    Procedures Procedures (including critical care time)  Medications Ordered in ED Medications  sodium chloride 0.9 % bolus 1,000 mL (0 mLs Intravenous Stopped 04/19/16 2048)     Initial Impression / Assessment and Plan / ED Course  I have reviewed the triage vital signs and the nursing notes.  Pertinent labs & imaging results that were available during my care of the patient were reviewed by me and considered in my medical decision making (see chart for details).      Final Clinical Impressions(s) / ED Diagnoses   Final diagnoses:  Hyperglycemia  Dehydration    Labs: CBC, urinalysis, BMP, urine culture, CBG  Imaging: DG Chest 2 View  Consults:  Therapeutics: Normal saline  Discharge Meds:   Assessment/Plan: 76 year old female presents today with reported hypoglycemia and altered mental status.  She is at her  baseline here per family.  She is very  well-appearing interacting appropriately.  She has no acute signs of infectious etiology, her CBG is within her normal range.  Patient does show signs of dehydration, she will be given a liter of normal saline and discharged to living facility.  Family very happy with today's plan, no further questions or concerns.      New Prescriptions New Prescriptions   No medications on file     Okey Regal, PA-C 06/00/45 [id number]    Delora Fuel, MD 41/42/39 [id number]    Delora Fuel, MD 23/34/35 [id number]

## 2016-04-19 NOTE — ED Notes (Signed)
ED Provider at bedside. 

## 2016-04-19 NOTE — ED Notes (Signed)
Bed: ZU40 Expected date:  Expected time:  Means of arrival:  Comments: 76 yo hyperglycemia/ UTI?

## 2016-04-19 NOTE — ED Triage Notes (Signed)
Pt is from [facility] facility, with c/o hyperglycemia. CBG "high" at facility and was given 15 units insulin around 1600, 485 for EMS. 215ml NS given by EMS.  Has been altered from baseline over the past several days (hx of dementia), excessive urination, and thirst. . VSS en route.

## 2016-04-19 NOTE — Discharge Instructions (Signed)
Please read attached information. If you experience any new or worsening signs or symptoms please return to the emergency room for evaluation. Please follow-up with your primary care provider or specialist as discussed.  °

## 2016-04-19 NOTE — ED Notes (Signed)
Patient transported to X-ray 

## 2016-04-21 LAB — URINE CULTURE

## 2016-04-22 ENCOUNTER — Telehealth: Payer: Self-pay | Admitting: Emergency Medicine

## 2016-04-22 NOTE — Telephone Encounter (Signed)
Post ED Visit - Positive Culture Follow-up  Culture report reviewed by antimicrobial stewardship pharmacist:  []  Elenor Quinones, Pharm.D. []  Heide Guile, Pharm.D., BCPS AQ-ID []  [place], Pharm.D., BCPS [x]  Alycia Rossetti, Pharm.D., BCPS []  [place], Pharm.D., BCPS, AAHIVP []  Legrand Como, Pharm.D., BCPS, AAHIVP []  Salome Arnt, PharmD, BCPS []  Dimitri Ped, PharmD, BCPS []  Vincenza Hews, PharmD, BCPS  Positive urine culture Treated with none, colonized, asymptomatic, no further patient follow-up is required at this time.  Hazle Nordmann 04/22/2016, 12:36 PM

## 2016-05-07 ENCOUNTER — Emergency Department (HOSPITAL_COMMUNITY)
Admission: EM | Admit: 2016-05-07 | Discharge: 2016-05-07 | Disposition: A | Discharge status: Home / Self Care | Payer: Medicare Other | Attending: Emergency Medicine | Admitting: Emergency Medicine

## 2016-05-07 ENCOUNTER — Encounter (HOSPITAL_COMMUNITY): Payer: Self-pay | Admitting: Emergency Medicine

## 2016-05-07 DIAGNOSIS — I10 Essential (primary) hypertension: Secondary | ICD-10-CM | POA: Insufficient documentation

## 2016-05-07 DIAGNOSIS — Z7982 Long term (current) use of aspirin: Secondary | ICD-10-CM | POA: Diagnosis not present

## 2016-05-07 DIAGNOSIS — J45909 Unspecified asthma, uncomplicated: Secondary | ICD-10-CM | POA: Insufficient documentation

## 2016-05-07 DIAGNOSIS — Z87891 Personal history of nicotine dependence: Secondary | ICD-10-CM | POA: Diagnosis not present

## 2016-05-07 DIAGNOSIS — Z794 Long term (current) use of insulin: Secondary | ICD-10-CM | POA: Diagnosis not present

## 2016-05-07 DIAGNOSIS — R300 Dysuria: Secondary | ICD-10-CM | POA: Insufficient documentation

## 2016-05-07 DIAGNOSIS — Z79899 Other long term (current) drug therapy: Secondary | ICD-10-CM | POA: Diagnosis not present

## 2016-05-07 DIAGNOSIS — E119 Type 2 diabetes mellitus without complications: Secondary | ICD-10-CM | POA: Diagnosis not present

## 2016-05-07 LAB — URINALYSIS, ROUTINE W REFLEX MICROSCOPIC
Bilirubin Urine: NEGATIVE
GLUCOSE, UA: NEGATIVE mg/dL
HGB URINE DIPSTICK: NEGATIVE
KETONES UR: NEGATIVE mg/dL
LEUKOCYTES UA: NEGATIVE
Nitrite: NEGATIVE
PROTEIN: NEGATIVE mg/dL
Specific Gravity, Urine: 1.008 (ref 1.005–1.030)
pH: 5 (ref 5.0–8.0)

## 2016-05-07 NOTE — ED Triage Notes (Signed)
Per EMS. Pt from [facility]. Hx of dementia, oriented per norm. Facility staff report pt has been having burning with urination and odor to urine.

## 2016-05-07 NOTE — ED Provider Notes (Signed)
[facility] DEPT Provider Note   CSN: [id number] Arrival date & time: 05/07/16  1111     History   Chief Complaint Chief Complaint  Patient presents with  . Dysuria    HPI Judith [name] is a 76 y.o. female. Complaint is possible urinary tract infection  HPI 76 year old female from an extended care facility with a history of dementia. Per staff she has complained of burning with urination. She does not complain about this on her arrival here. Staff also stated that she had a strong smell to her urine. No reported fevers or vomiting. Patient complains of a sore spot on her gum.  Past Medical History:  Diagnosis Date  . Anxiety   . Arthritis    "left leg" (05/17/2013)  . Asthma   . 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 Closed right ankle fracture   . Colitis 09/06/02  . Diabetes mellitus without complication ([place])   . Fracture of femur, distal, right, closed ([place]) 03/01/2012  . GERD (gastroesophageal reflux disease)   . HCWCBJSE(831.5)    "probably weekly" (05/17/2013)  . Hemorrhoid   . High cholesterol   . History of stomach ulcers   . Hyperlipidemia   . Hypertension   . Insulin dependent diabetes mellitus ([place]) 03/01/2012  . Obesity (BMI 30-39.9) 03/03/2012  . Pneumonia    "once" (05/17/2013)  . Proctocolitis 09/04/02    Patient Active Problem List   Diagnosis Date Noted  . Uterine hypertrophy 06/11/2015  . Kidney lesion, native, bilateral 06/11/2015  . Protein-calorie malnutrition, severe ([place]) 09/22/2013  . Dementia 09/21/2013  . Constipation 09/21/2013  . Rectal bleeding 09/21/2013  . Fecal impaction ([place]) 09/21/2013  . BRBPR (bright red blood per rectum) 09/21/2013  . Altered mental status 05/17/2013  . Altered mental state 05/17/2013  . Delirium due to another medical condition 02/18/2013  . Acute blood loss anemia 03/03/2012  . Hx of right BKA ([place]) 03/03/2012  . Fall 03/03/2012  . Obesity (BMI 30-39.9) 03/03/2012  . Poor nutrition 03/03/2012  .  Fracture of femur, distal, right, closed ([place]) 03/01/2012  . UTI (lower urinary tract infection) 03/01/2012  . Insulin dependent diabetes mellitus ([place]) 03/01/2012  . Hypertension     Past Surgical History:  Procedure Laterality Date  . ABDOMINAL HYSTERECTOMY    . BELOW KNEE LEG AMPUTATION Right 02/2008  . BREAST LUMPECTOMY Bilateral    "not cancer"  . CATARACT EXTRACTION W/ INTRAOCULAR LENS  IMPLANT, BILATERAL Bilateral   . ORIF FEMUR FRACTURE  03/01/2012   Procedure: OPEN REDUCTION INTERNAL FIXATION (ORIF) DISTAL FEMUR FRACTURE;  Surgeon: Rozanna Box, MD;  Location: [facility];  Service: Orthopedics;  Laterality: Right;  ORIF right femur    OB History    Gravida Para Term Preterm AB Living   0 0 0 0 0     SAB TAB Ectopic Multiple Live Births   0 0 0           Home Medications    Prior to Admission medications   Medication Sig Start Date End Date Taking? Authorizing Provider  acetaminophen (TYLENOL) 650 MG CR tablet Take 650 mg by mouth every 4 (four) hours as needed for pain.   Yes Historical Provider, MD  aspirin 325 MG tablet Take 325 mg by mouth daily with breakfast.    Yes Historical Provider, MD  atorvastatin (LIPITOR) 20 MG tablet Take 20 mg by mouth every evening.    Yes Historical Provider, MD  chlorhexidine ([place])  0.12 % solution Use as directed 15 mLs in the mouth or throat 2 (two) times daily. 15 ml by mouth twice daily for mouth sores after brushing teeth. Swish for seconds. Do Not rinse mouth or brush teeth after use.   Yes Historical Provider, MD  ciprofloxacin (CIPRO) 500 MG tablet Take 500 mg by mouth 2 (two) times daily. Started 04/12 for 7 days   Yes Historical Provider, MD  cloNIDine (CATAPRES) 0.1 MG tablet Take 0.1 mg by mouth 2 (two) times daily.   Yes Historical Provider, MD  docusate sodium (COLACE) 100 MG capsule Take 1 capsule (100 mg total) by mouth every 12 (twelve) hours. Patient taking differently: Take 100 mg by mouth 2 (two) times daily.  05/26/15   Yes Orpah Greek, MD  glipiZIDE (GLUCOTROL XL) 5 MG 24 hr tablet Take 5 mg by mouth daily with breakfast.    Yes Historical Provider, MD  hydrocerin (EUCERIN) CREA Apply 1 application topically 2 (two) times daily. Applied to bilateral legs twice a day   Yes Historical Provider, MD  hydrocerin (EUCERIN) CREA Apply 1 application topically 2 (two) times daily as needed (for rash on bilateral legs).   Yes Historical Provider, MD  hydrocortisone (ANUSOL-HC) 2.5 % rectal cream Place 1 application rectally every 12 (twelve) hours as needed for hemorrhoids or itching.   Yes Historical Provider, MD  hydrocortisone cream (HYDROCORTISONE MAX ST) 1 % Apply 1 application topically 3 (three) times daily as needed for itching.   Yes Historical Provider, MD  insulin aspart (NOVOLOG) 100 UNIT/ML injection Inject 0-15 Units into the skin 3 (three) times daily before meals. Per sliding scale   0-150 = 0 units 151-200 = 3 units 201-250 = 5 units 251-300 = 7 units 301-350 = 9 units 351-400 = 11 units 401-450 = 13 units 451-500 = 15 units and call provider   Yes Historical Provider, MD  insulin glargine (LANTUS) 100 UNIT/ML injection Inject 18 Units into the skin at bedtime.    Yes Historical Provider, MD  lisinopril (PRINIVIL,ZESTRIL) 40 MG tablet Take 40 mg by mouth daily with breakfast.    Yes Historical Provider, MD  loperamide (IMODIUM) 2 MG capsule Take 2-4 mg by mouth as needed for diarrhea or loose stools.   Yes Historical Provider, MD  LORazepam (ATIVAN) 0.5 MG tablet Take 1 tablet (0.5 mg total) by mouth every 6 (six) hours as needed for anxiety. 04/28/14  Yes Larence Penning, MD  magnesium hydroxide (MILK OF MAGNESIA) 400 MG/5ML suspension Take 30 mLs by mouth daily as needed for mild constipation.   Yes Historical Provider, MD  metoprolol succinate (TOPROL-XL) 50 MG 24 hr tablet Take 75 mg by mouth daily with breakfast. Take with or immediately following a meal.    Yes Historical Provider, MD    polyethylene glycol (MIRALAX / GLYCOLAX) packet Take 17 g by mouth daily. Patient taking differently: Take 17 g by mouth daily with breakfast.  09/22/13  Yes [place], PA-C  ranitidine (ZANTAC) 150 MG tablet Take 150 mg by mouth every evening.    Yes Historical Provider, MD  saccharomyces boulardii (FLORASTOR) 250 MG capsule Take 250 mg by mouth 2 (two) times daily. Started 04/12 for 10 days   Yes Historical Provider, MD  traMADol (ULTRAM) 50 MG tablet Take 50 mg by mouth every 12 (twelve) hours as needed for moderate pain.   Yes Historical Provider, MD  insulin detemir (LEVEMIR) 100 UNIT/ML injection Inject 0.1 mLs (10 Units total) into the  skin daily with lunch. Patient not taking: Reported on 04/19/2016 09/22/13   Waldon Merl, PA-C    Family History Family History  Problem Relation Age of Onset  . Diabetes Mother   . Diabetes Father   . Diabetes Sister     x3  . Diabetes Brother     x4  . Colon cancer Neg Hx   . Colon polyps Neg Hx     Social History Social History  Substance Use Topics  . Smoking status: Former Smoker    Packs/day: 1.00    Years: 5.00    Quit date: 01/26/1993  . Smokeless tobacco: Former Systems developer     Comment: "quit smoking at ~ age 48"  . Alcohol use No     Allergies   Patient has no known allergies.   Review of Systems Review of Systems Level V caveat for dementia  Physical Exam Updated Vital Signs BP (!) 183/85   Pulse 73   Temp 98.7 F (37.1 C) (Oral)   Resp 18   SpO2 92%   Physical Exam  Constitutional: She appears well-developed and well-nourished. No distress.  HENT:  Head: Normocephalic.  Some erythema of the right maxillary gingiva. No dental abnormalities (patient is edentulous) .  Eyes: Conjunctivae are normal. Pupils are equal, round, and reactive to light. No scleral icterus.  Neck: Normal range of motion. Neck supple. No thyromegaly present.  Cardiovascular: Normal rate and regular rhythm.  Exam reveals no gallop and no  friction rub.   No murmur heard. Pulmonary/Chest: Effort normal and breath sounds normal. No respiratory distress. She has no wheezes. She has no rales.  Abdominal: Soft. Bowel sounds are normal. She exhibits no distension. There is no tenderness. There is no rebound.  Musculoskeletal: Normal range of motion.  Neurological: She is alert.  Skin: Skin is warm and dry. No rash noted.  Psychiatric: She has a normal mood and affect. Her behavior is normal.     ED Treatments / Results  Labs (all labs ordered are listed, but only abnormal results are displayed) Labs Reviewed  URINALYSIS, ROUTINE W REFLEX MICROSCOPIC - Abnormal; Notable for the following:       Result Value   Color, Urine STRAW (*)    All other components within normal limits  URINE CULTURE    EKG  EKG Interpretation None       Radiology No results found.  Procedures Procedures (including critical care time)  Medications Ordered in ED Medications - No data to display   Initial Impression / Assessment and Plan / ED Course  I have reviewed the triage vital signs and the nursing notes.  Pertinent labs & imaging results that were available during my care of the patient were reviewed by me and considered in my medical decision making (see chart for details).    Normal urine. Normal vital signs. She has no glucose in her urine thus doubt hyperglycemia. Urinalysis is not concentrated this doubt dehydration. Anxious appropriate for discharge back to her facility. We will contact the patient or her physician if culture shows positive growth.  Final Clinical Impressions(s) / ED Diagnoses   Final diagnoses:  Dysuria    New Prescriptions New Prescriptions   No medications on file     Tanna Furry, MD 05/07/16 1412

## 2016-05-07 NOTE — Discharge Instructions (Signed)
No abnormalities were determined during patient's emergency room visit today. If urine culture shows infection in need of treatment, a prescription will be called in to pharmacy for her. No infection noted today.

## 2016-05-07 NOTE — ED Notes (Signed)
Bed: PT46 Expected date:  Expected time:  Means of arrival:  Comments: EMS uti, 76 yo

## 2016-05-08 LAB — URINE CULTURE: Culture: >=100000 — AB

## 2016-05-09 ENCOUNTER — Telehealth: Payer: Self-pay

## 2016-05-09 NOTE — Telephone Encounter (Signed)
Post ED Visit - Positive Culture Follow-up  Culture report reviewed by antimicrobial stewardship pharmacist:  []  Elenor Quinones, Pharm.D. []  Heide Guile, Pharm.D., BCPS AQ-ID []  [place], Pharm.D., BCPS []  Alycia Rossetti, Pharm.D., BCPS []  [place], Pharm.D., BCPS, AAHIVP []  Legrand Como, Pharm.D., BCPS, AAHIVP []  Salome Arnt, PharmD, BCPS [x]  Dimitri Ped, PharmD, BCPS []  Vincenza Hews, PharmD, BCPS  Positive urine culture Treated with Ciprofloxacin, organism sensitive to the same and no further patient follow-up is required at this time.  Genia Del 05/09/2016, 10:02 AM

## 2016-07-10 ENCOUNTER — Encounter (HOSPITAL_COMMUNITY): Payer: Self-pay | Admitting: Emergency Medicine

## 2016-07-10 ENCOUNTER — Emergency Department (HOSPITAL_COMMUNITY)
Admission: EM | Admit: 2016-07-10 | Discharge: 2016-07-10 | Disposition: A | Discharge status: Skilled Nursing Facility | Payer: Medicare Other | Attending: Emergency Medicine | Admitting: Emergency Medicine

## 2016-07-10 DIAGNOSIS — Z87891 Personal history of nicotine dependence: Secondary | ICD-10-CM | POA: Insufficient documentation

## 2016-07-10 DIAGNOSIS — E11649 Type 2 diabetes mellitus with hypoglycemia without coma: Secondary | ICD-10-CM | POA: Diagnosis not present

## 2016-07-10 DIAGNOSIS — Z7982 Long term (current) use of aspirin: Secondary | ICD-10-CM | POA: Insufficient documentation

## 2016-07-10 DIAGNOSIS — R7309 Other abnormal glucose: Secondary | ICD-10-CM | POA: Diagnosis present

## 2016-07-10 DIAGNOSIS — I10 Essential (primary) hypertension: Secondary | ICD-10-CM | POA: Insufficient documentation

## 2016-07-10 DIAGNOSIS — Z79899 Other long term (current) drug therapy: Secondary | ICD-10-CM | POA: Diagnosis not present

## 2016-07-10 DIAGNOSIS — E162 Hypoglycemia, unspecified: Secondary | ICD-10-CM

## 2016-07-10 DIAGNOSIS — J45909 Unspecified asthma, uncomplicated: Secondary | ICD-10-CM | POA: Diagnosis not present

## 2016-07-10 DIAGNOSIS — Z794 Long term (current) use of insulin: Secondary | ICD-10-CM | POA: Insufficient documentation

## 2016-07-10 DIAGNOSIS — G309 Alzheimer's disease, unspecified: Secondary | ICD-10-CM | POA: Insufficient documentation

## 2016-07-10 LAB — CBC WITH DIFFERENTIAL/PLATELET
Basophils Absolute: 0.1 10*3/uL (ref 0.0–0.1)
Basophils Relative: 1 %
Eosinophils Absolute: 0.1 10*3/uL (ref 0.0–0.7)
Eosinophils Relative: 1 %
HEMATOCRIT: 39.9 % (ref 36.0–46.0)
HEMOGLOBIN: 12.7 g/dL (ref 12.0–15.0)
LYMPHS ABS: 1.3 10*3/uL (ref 0.7–4.0)
Lymphocytes Relative: 20 %
MCH: 26.5 pg (ref 26.0–34.0)
MCHC: 31.8 g/dL (ref 30.0–36.0)
MCV: 83.3 fL (ref 78.0–100.0)
MONOS PCT: 6 %
Monocytes Absolute: 0.4 10*3/uL (ref 0.1–1.0)
NEUTROS ABS: 4.4 10*3/uL (ref 1.7–7.7)
NEUTROS PCT: 72 %
Platelets: 289 10*3/uL (ref 150–400)
RBC: 4.79 MIL/uL (ref 3.87–5.11)
RDW: 15 % (ref 11.5–15.5)
WBC: 6.2 10*3/uL (ref 4.0–10.5)

## 2016-07-10 LAB — URINALYSIS, ROUTINE W REFLEX MICROSCOPIC
Bilirubin Urine: NEGATIVE
Glucose, UA: 50 mg/dL — AB
Hgb urine dipstick: NEGATIVE
KETONES UR: NEGATIVE mg/dL
LEUKOCYTES UA: NEGATIVE
NITRITE: NEGATIVE
PROTEIN: NEGATIVE mg/dL
Specific Gravity, Urine: 1.016 (ref 1.005–1.030)
pH: 5 (ref 5.0–8.0)

## 2016-07-10 LAB — COMPREHENSIVE METABOLIC PANEL
ALK PHOS: 87 U/L (ref 38–126)
ALT: 13 U/L — ABNORMAL LOW (ref 14–54)
ANION GAP: 11 (ref 5–15)
AST: 19 U/L (ref 15–41)
Albumin: 3.6 g/dL (ref 3.5–5.0)
BILIRUBIN TOTAL: 0.5 mg/dL (ref 0.3–1.2)
BUN: 30 mg/dL — ABNORMAL HIGH (ref 6–20)
CALCIUM: 9.3 mg/dL (ref 8.9–10.3)
CO2: 25 mmol/L (ref 22–32)
Chloride: 108 mmol/L (ref 101–111)
Creatinine, Ser: 1.04 mg/dL — ABNORMAL HIGH (ref 0.44–1.00)
GFR calc non Af Amer: 50 mL/min — ABNORMAL LOW (ref >60)
GFR, EST AFRICAN AMERICAN: 59 mL/min — AB (ref >60)
Glucose, Bld: 118 mg/dL — ABNORMAL HIGH (ref 65–99)
Potassium: 3.7 mmol/L (ref 3.5–5.1)
Sodium: 144 mmol/L (ref 135–145)
TOTAL PROTEIN: 7.5 g/dL (ref 6.5–8.1)

## 2016-07-10 LAB — CBG MONITORING, ED
GLUCOSE-CAPILLARY: 112 mg/dL — AB (ref 65–99)
GLUCOSE-CAPILLARY: 166 mg/dL — AB (ref 65–99)
Glucose-Capillary: 136 mg/dL — ABNORMAL HIGH (ref 65–99)
Glucose-Capillary: 102 mg/dL — ABNORMAL HIGH (ref 65–99)
Glucose-Capillary: 152 mg/dL — ABNORMAL HIGH (ref 65–99)

## 2016-07-10 MED ORDER — DEXTROSE 50 % IV SOLN
INTRAVENOUS | Status: AC
  Filled 2016-07-10: qty 50

## 2016-07-10 NOTE — ED Notes (Signed)
Pt's CBG taken=152

## 2016-07-10 NOTE — ED Notes (Signed)
Per provider check CBG when obtaining urine. If WNL obtain CBG ONLY every hour.

## 2016-07-10 NOTE — ED Notes (Signed)
IV and monitor to stay in place until transportation arrives. Family remains at bedside.

## 2016-07-10 NOTE — ED Notes (Signed)
Bed: WA23 Expected date:  Expected time:  Means of arrival:  Comments: EMS/hypoglycemia 

## 2016-07-10 NOTE — Discharge Instructions (Signed)
Encourage patient to eat regularly. If pt does not eat, hold glyburide and insulin and contact MD.

## 2016-07-10 NOTE — ED Notes (Signed)
Meal tray given to pt per provider request. Daughter at bedside.

## 2016-07-10 NOTE — ED Triage Notes (Signed)
Per EMS pt sent from [facility] for hypoglycemia; CBG on EMS arrival 28; D50 given and 250 ml of NS bolus; CBG 159 post EMS treatment. Pt hx of dementia; alert pt normal at present time.

## 2016-07-10 NOTE — ED Provider Notes (Addendum)
[facility] DEPT Provider Note   CSN: [id number] Arrival date & time: 07/10/16  4332     History   Chief Complaint Chief Complaint  Patient presents with  . Hypoglycemia    HPI Judith [name] is a 76 y.o. female. Chief complaint is hypoglycemia  HPI:  76 year old female who resides at a skilled nursing facility secondary to Alzheimer's dementia. Also insulin-dependent diabetes. Acting unusual this morning. Had had her morning insulin, but did not eat breakfast. Blood sugar found to be 23. Given IV D50 by paramedics and became awake and alert. Per staff at the facility prior to leaving with EMS was "at her baseline". Arrives here. Recheck blood sugar 105.  Her daughter arrives short time later. Felt like she was "almost" back to normal. Recheck blood sugar here again greater than 100. She is getting some juice and a meal here. She has no complaints upon reevaluation. Per daughter there've been no reports of illness.  Past Medical History:  Diagnosis Date  . Anxiety   . Arthritis    "left leg" (05/17/2013)  . Asthma   . 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 Closed right ankle fracture   . Colitis 09/06/02  . Diabetes mellitus without complication ([place])   . Fracture of femur, distal, right, closed ([place]) 03/01/2012  . GERD (gastroesophageal reflux disease)   . RJJOACZY(606.3)    "probably weekly" (05/17/2013)  . Hemorrhoid   . High cholesterol   . History of stomach ulcers   . Hyperlipidemia   . Hypertension   . Insulin dependent diabetes mellitus ([place]) 03/01/2012  . Obesity (BMI 30-39.9) 03/03/2012  . Pneumonia    "once" (05/17/2013)  . Proctocolitis 09/04/02    Patient Active Problem List   Diagnosis Date Noted  . Uterine hypertrophy 06/11/2015  . Kidney lesion, native, bilateral 06/11/2015  . Protein-calorie malnutrition, severe ([place]) 09/22/2013  . Dementia 09/21/2013  . Constipation 09/21/2013  . Rectal bleeding 09/21/2013  . Fecal impaction ([place]) 09/21/2013    . BRBPR (bright red blood per rectum) 09/21/2013  . Altered mental status 05/17/2013  . Altered mental state 05/17/2013  . Delirium due to another medical condition 02/18/2013  . Acute blood loss anemia 03/03/2012  . 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 Fall 03/03/2012  . Obesity (BMI 30-39.9) 03/03/2012  . Poor nutrition 03/03/2012  . Fracture of femur, distal, right, closed ([place]) 03/01/2012  . UTI (lower urinary tract infection) 03/01/2012  . Insulin dependent diabetes mellitus ([place]) 03/01/2012  . Hypertension     Past Surgical History:  Procedure Laterality Date  . ABDOMINAL HYSTERECTOMY    . BELOW KNEE LEG AMPUTATION Right 02/2008  . BREAST LUMPECTOMY Bilateral    "not cancer"  . CATARACT EXTRACTION W/ INTRAOCULAR LENS  IMPLANT, BILATERAL Bilateral   . ORIF FEMUR FRACTURE  03/01/2012   Procedure: OPEN REDUCTION INTERNAL FIXATION (ORIF) DISTAL FEMUR FRACTURE;  Surgeon: Rozanna Box, MD;  Location: [facility];  Service: Orthopedics;  Laterality: Right;  ORIF right femur    OB History    Gravida Para Term Preterm AB Living   0 0 0 0 0     SAB TAB Ectopic Multiple Live Births   0 0 0           Home Medications    Prior to Admission medications   Medication Sig Start Date End Date Taking? Authorizing Provider  acetaminophen (TYLENOL) 650 MG CR tablet Take 650 mg by mouth every 4 (  four) hours as needed for pain.    [provider]  aspirin 325 MG tablet Take 325 mg by mouth daily with breakfast.     [provider]  atorvastatin (LIPITOR) 20 MG tablet Take 20 mg by mouth every evening.     [provider]  chlorhexidine (PERIDEX) 0.12 % solution Use as directed 15 mLs in the mouth or throat 2 (two) times daily. 15 ml by mouth twice daily for mouth sores after brushing teeth. Swish for seconds. Do Not rinse mouth or brush teeth after use.    [provider]  ciprofloxacin (CIPRO) 500 MG tablet Take 500 mg by mouth 2 (two) times daily. Started  04/12 for 7 days    [provider]  cloNIDine (CATAPRES) 0.1 MG tablet Take 0.1 mg by mouth 2 (two) times daily.    [provider]  docusate sodium (COLACE) 100 MG capsule Take 1 capsule (100 mg total) by mouth every 12 (twelve) hours. Patient taking differently: Take 100 mg by mouth 2 (two) times daily.  05/26/15   Orpah Greek, MD  glipiZIDE (GLUCOTROL XL) 5 MG 24 hr tablet Take 5 mg by mouth daily with breakfast.     [provider]  hydrocerin (EUCERIN) CREA Apply 1 application topically 2 (two) times daily. Applied to bilateral legs twice a day    [provider]  hydrocerin (EUCERIN) CREA Apply 1 application topically 2 (two) times daily as needed (for rash on bilateral legs).    [provider]  hydrocortisone (ANUSOL-HC) 2.5 % rectal cream Place 1 application rectally every 12 (twelve) hours as needed for hemorrhoids or itching.    [provider]  hydrocortisone cream (HYDROCORTISONE MAX ST) 1 % Apply 1 application topically 3 (three) times daily as needed for itching.    [provider]  insulin aspart (NOVOLOG) 100 UNIT/ML injection Inject 0-15 Units into the skin 3 (three) times daily before meals. Per sliding scale   0-150 = 0 units 151-200 = 3 units 201-250 = 5 units 251-300 = 7 units 301-350 = 9 units 351-400 = 11 units 401-450 = 13 units 451-500 = 15 units and call provider    [provider]  insulin detemir (LEVEMIR) 100 UNIT/ML injection Inject 0.1 mLs (10 Units total) into the skin daily with lunch. Patient not taking: Reported on 04/19/2016 09/22/13   Waldon Merl, PA-C  insulin glargine (LANTUS) 100 UNIT/ML injection Inject 18 Units into the skin at bedtime.     [provider]  lisinopril (PRINIVIL,ZESTRIL) 40 MG tablet Take 40 mg by mouth daily with breakfast.     [provider]  loperamide (IMODIUM) 2 MG capsule Take 2-4 mg by mouth as needed for diarrhea or loose  stools.    [provider]  LORazepam (ATIVAN) 0.5 MG tablet Take 1 tablet (0.5 mg total) by mouth every 6 (six) hours as needed for anxiety. 04/28/14   Larence Penning, MD  magnesium hydroxide (MILK OF MAGNESIA) 400 MG/5ML suspension Take 30 mLs by mouth daily as needed for mild constipation.    [provider]  metoprolol succinate (TOPROL-XL) 50 MG 24 hr tablet Take 75 mg by mouth daily with breakfast. Take with or immediately following a meal.     [provider]  polyethylene glycol (MIRALAX / GLYCOLAX) packet Take 17 g by mouth daily. Patient taking differently: Take 17 g by mouth daily with breakfast.  09/22/13   Waldon Merl, PA-C  ranitidine (  ZANTAC) 150 MG tablet Take 150 mg by mouth every evening.     [provider]  saccharomyces boulardii (FLORASTOR) 250 MG capsule Take 250 mg by mouth 2 (two) times daily. Started 04/12 for 10 days    [provider]  traMADol (ULTRAM) 50 MG tablet Take 50 mg by mouth every 12 (twelve) hours as needed for moderate pain.    [provider]    Family History Family History  Problem Relation Age of Onset  . Diabetes Mother   . Diabetes Father   . Diabetes Sister        x3  . Diabetes Brother        x4  . Colon cancer Neg Hx   . Colon polyps Neg Hx     Social History Social History  Substance Use Topics  . Smoking status: Former Smoker    Packs/day: 1.00    Years: 5.00    Quit date: 01/26/1993  . Smokeless tobacco: Former Systems developer     Comment: "quit smoking at ~ age 58"  . Alcohol use No     Allergies   Patient has no known allergies.   Review of Systems Review of Systems  Unable to perform ROS: Dementia  Endocrine:       Hypoglycemia per report.     Physical Exam Updated Vital Signs BP (!) 155/89   Pulse 79   Temp 97.9 F (36.6 C)   Resp 12   SpO2 99% Comment: Simultaneous filing. User may not have seen previous data.  Physical Exam  Constitutional: She appears  well-developed and well-nourished. No distress.  Awake and alert.  HENT:  Head: Normocephalic.  Eyes: Conjunctivae are normal. Pupils are equal, round, and reactive to light. No scleral icterus.  Neck: Normal range of motion. Neck supple. No thyromegaly present.  Cardiovascular: Normal rate and regular rhythm.  Exam reveals no gallop and no friction rub.   No murmur heard. Pulmonary/Chest: Effort normal and breath sounds normal. No respiratory distress. She has no wheezes. She has no rales.  Abdominal: Soft. Bowel sounds are normal. She exhibits no distension. There is no tenderness. There is no rebound.  Musculoskeletal: Normal range of motion.  Neurological: She is alert.  Skin: Skin is warm and dry. No rash noted.  Psychiatric: She has a normal mood and affect. Her behavior is normal.     ED Treatments / Results  Labs (all labs ordered are listed, but only abnormal results are displayed) Labs Reviewed  CBG MONITORING, ED - Abnormal; Notable for the following:       Result Value   Glucose-Capillary 136 (*)    All other components within normal limits  CBG MONITORING, ED - Abnormal; Notable for the following:    Glucose-Capillary 102 (*)    All other components within normal limits  CBC WITH DIFFERENTIAL/PLATELET  COMPREHENSIVE METABOLIC PANEL  URINALYSIS, ROUTINE W REFLEX MICROSCOPIC    EKG  EKG Interpretation None       Radiology No results found.  Procedures Procedures (including critical care time)  Medications Ordered in ED Medications - No data to display   Initial Impression / Assessment and Plan / ED Course  I have reviewed the triage vital signs and the nursing notes.  Pertinent labs & imaging results that were available during my care of the patient were reviewed by me and considered in my medical decision making (see chart for details).   patient is on oral hypoglycemic, glipizide. We'll plan feeling  take here. Lab evaluation. Urine. Will follow for  several hours to ensure stability of her blood sugars.  Final Clinical Impressions(s) / ED Diagnoses   Final diagnoses:  Hypoglycemia    New Prescriptions New Prescriptions   No medications on file     Patient with multiple abnormal blood sugars. Has eaten a meal here. Plan will be discharge back to her care facility. Instructions to encourage by mouth intake. If patient does not eat well for, instructed to hold insulin, and glyburide, and contact primary care physician  Tanna Furry, MD 07/10/16 1314    Tanna Furry, MD 07/10/16 1134

## 2016-07-10 NOTE — ED Notes (Signed)
8 ounces of orange juice given to pt per provider request.

## 2016-07-21 ENCOUNTER — Encounter (HOSPITAL_BASED_OUTPATIENT_CLINIC_OR_DEPARTMENT_OTHER): Payer: Medicare Other | Attending: Surgery

## 2016-07-21 DIAGNOSIS — Z794 Long term (current) use of insulin: Secondary | ICD-10-CM | POA: Insufficient documentation

## 2016-07-21 DIAGNOSIS — I10 Essential (primary) hypertension: Secondary | ICD-10-CM | POA: Diagnosis not present

## 2016-07-21 DIAGNOSIS — E11610 Type 2 diabetes mellitus with diabetic neuropathic arthropathy: Secondary | ICD-10-CM | POA: Diagnosis not present

## 2016-07-21 DIAGNOSIS — K5900 Constipation, unspecified: Secondary | ICD-10-CM | POA: Diagnosis not present

## 2016-07-21 DIAGNOSIS — E11621 Type 2 diabetes mellitus with foot ulcer: Secondary | ICD-10-CM | POA: Insufficient documentation

## 2016-07-21 DIAGNOSIS — E669 Obesity, unspecified: Secondary | ICD-10-CM | POA: Diagnosis not present

## 2016-07-21 DIAGNOSIS — Z7982 Long term (current) use of aspirin: Secondary | ICD-10-CM | POA: Insufficient documentation

## 2016-07-21 DIAGNOSIS — Z87891 Personal history of nicotine dependence: Secondary | ICD-10-CM | POA: Diagnosis not present

## 2016-07-21 DIAGNOSIS — Z89511 Acquired absence of right leg below knee: Secondary | ICD-10-CM | POA: Insufficient documentation

## 2016-07-21 DIAGNOSIS — F419 Anxiety disorder, unspecified: Secondary | ICD-10-CM | POA: Diagnosis not present

## 2016-07-21 DIAGNOSIS — L97522 Non-pressure chronic ulcer of other part of left foot with fat layer exposed: Secondary | ICD-10-CM | POA: Insufficient documentation

## 2016-07-21 DIAGNOSIS — K219 Gastro-esophageal reflux disease without esophagitis: Secondary | ICD-10-CM | POA: Diagnosis not present

## 2016-07-21 DIAGNOSIS — Z79899 Other long term (current) drug therapy: Secondary | ICD-10-CM | POA: Diagnosis not present

## 2016-07-21 DIAGNOSIS — E7800 Pure hypercholesterolemia, unspecified: Secondary | ICD-10-CM | POA: Insufficient documentation

## 2016-07-21 DIAGNOSIS — L89622 Pressure ulcer of left heel, stage 2: Secondary | ICD-10-CM | POA: Insufficient documentation

## 2016-07-21 DIAGNOSIS — G309 Alzheimer's disease, unspecified: Secondary | ICD-10-CM | POA: Diagnosis not present

## 2016-07-21 DIAGNOSIS — E1140 Type 2 diabetes mellitus with diabetic neuropathy, unspecified: Secondary | ICD-10-CM | POA: Insufficient documentation

## 2016-07-21 DIAGNOSIS — F0280 Dementia in other diseases classified elsewhere without behavioral disturbance: Secondary | ICD-10-CM | POA: Diagnosis not present

## 2016-08-04 ENCOUNTER — Encounter (HOSPITAL_BASED_OUTPATIENT_CLINIC_OR_DEPARTMENT_OTHER): Payer: Medicare Other | Attending: Surgery

## 2016-08-04 DIAGNOSIS — Z89511 Acquired absence of right leg below knee: Secondary | ICD-10-CM | POA: Diagnosis not present

## 2016-08-04 DIAGNOSIS — I10 Essential (primary) hypertension: Secondary | ICD-10-CM | POA: Insufficient documentation

## 2016-08-04 DIAGNOSIS — E11621 Type 2 diabetes mellitus with foot ulcer: Secondary | ICD-10-CM | POA: Insufficient documentation

## 2016-08-04 DIAGNOSIS — G309 Alzheimer's disease, unspecified: Secondary | ICD-10-CM | POA: Insufficient documentation

## 2016-08-04 DIAGNOSIS — Z794 Long term (current) use of insulin: Secondary | ICD-10-CM | POA: Diagnosis not present

## 2016-08-04 DIAGNOSIS — E1140 Type 2 diabetes mellitus with diabetic neuropathy, unspecified: Secondary | ICD-10-CM | POA: Insufficient documentation

## 2016-08-04 DIAGNOSIS — L89622 Pressure ulcer of left heel, stage 2: Secondary | ICD-10-CM | POA: Diagnosis not present

## 2016-08-04 DIAGNOSIS — L97522 Non-pressure chronic ulcer of other part of left foot with fat layer exposed: Secondary | ICD-10-CM | POA: Insufficient documentation

## 2016-08-04 DIAGNOSIS — F0280 Dementia in other diseases classified elsewhere without behavioral disturbance: Secondary | ICD-10-CM | POA: Diagnosis not present

## 2016-08-11 DIAGNOSIS — L89622 Pressure ulcer of left heel, stage 2: Secondary | ICD-10-CM | POA: Diagnosis not present

## 2016-08-18 DIAGNOSIS — L89622 Pressure ulcer of left heel, stage 2: Secondary | ICD-10-CM | POA: Diagnosis not present

## 2016-08-25 DIAGNOSIS — L89622 Pressure ulcer of left heel, stage 2: Secondary | ICD-10-CM | POA: Diagnosis not present

## 2016-09-02 ENCOUNTER — Encounter (HOSPITAL_BASED_OUTPATIENT_CLINIC_OR_DEPARTMENT_OTHER): Payer: Medicare Other | Attending: Surgery

## 2016-09-02 DIAGNOSIS — Z89511 Acquired absence of right leg below knee: Secondary | ICD-10-CM | POA: Insufficient documentation

## 2016-09-02 DIAGNOSIS — Z794 Long term (current) use of insulin: Secondary | ICD-10-CM | POA: Insufficient documentation

## 2016-09-02 DIAGNOSIS — E11621 Type 2 diabetes mellitus with foot ulcer: Secondary | ICD-10-CM | POA: Insufficient documentation

## 2016-09-02 DIAGNOSIS — F0280 Dementia in other diseases classified elsewhere without behavioral disturbance: Secondary | ICD-10-CM | POA: Insufficient documentation

## 2016-09-02 DIAGNOSIS — L89622 Pressure ulcer of left heel, stage 2: Secondary | ICD-10-CM | POA: Insufficient documentation

## 2016-09-02 DIAGNOSIS — I10 Essential (primary) hypertension: Secondary | ICD-10-CM | POA: Insufficient documentation

## 2016-09-02 DIAGNOSIS — L97522 Non-pressure chronic ulcer of other part of left foot with fat layer exposed: Secondary | ICD-10-CM | POA: Insufficient documentation

## 2016-09-02 DIAGNOSIS — G309 Alzheimer's disease, unspecified: Secondary | ICD-10-CM | POA: Insufficient documentation

## 2016-09-02 DIAGNOSIS — L97529 Non-pressure chronic ulcer of other part of left foot with unspecified severity: Secondary | ICD-10-CM | POA: Insufficient documentation

## 2016-09-02 DIAGNOSIS — E1140 Type 2 diabetes mellitus with diabetic neuropathy, unspecified: Secondary | ICD-10-CM | POA: Insufficient documentation

## 2016-09-15 DIAGNOSIS — Z89511 Acquired absence of right leg below knee: Secondary | ICD-10-CM | POA: Diagnosis not present

## 2016-09-15 DIAGNOSIS — L97529 Non-pressure chronic ulcer of other part of left foot with unspecified severity: Secondary | ICD-10-CM | POA: Diagnosis not present

## 2016-09-15 DIAGNOSIS — G309 Alzheimer's disease, unspecified: Secondary | ICD-10-CM | POA: Diagnosis not present

## 2016-09-15 DIAGNOSIS — E11621 Type 2 diabetes mellitus with foot ulcer: Secondary | ICD-10-CM | POA: Diagnosis not present

## 2016-09-15 DIAGNOSIS — L89622 Pressure ulcer of left heel, stage 2: Secondary | ICD-10-CM | POA: Diagnosis present

## 2016-09-15 DIAGNOSIS — Z794 Long term (current) use of insulin: Secondary | ICD-10-CM | POA: Diagnosis not present

## 2016-09-15 DIAGNOSIS — L97522 Non-pressure chronic ulcer of other part of left foot with fat layer exposed: Secondary | ICD-10-CM | POA: Diagnosis not present

## 2016-09-15 DIAGNOSIS — E1140 Type 2 diabetes mellitus with diabetic neuropathy, unspecified: Secondary | ICD-10-CM | POA: Diagnosis not present

## 2016-09-15 DIAGNOSIS — I10 Essential (primary) hypertension: Secondary | ICD-10-CM | POA: Diagnosis not present

## 2016-09-15 DIAGNOSIS — F0280 Dementia in other diseases classified elsewhere without behavioral disturbance: Secondary | ICD-10-CM | POA: Diagnosis not present

## 2016-09-23 DIAGNOSIS — L89622 Pressure ulcer of left heel, stage 2: Secondary | ICD-10-CM | POA: Diagnosis not present

## 2016-09-29 ENCOUNTER — Encounter (HOSPITAL_BASED_OUTPATIENT_CLINIC_OR_DEPARTMENT_OTHER): Payer: Medicare Other | Attending: Surgery

## 2016-09-29 DIAGNOSIS — F0280 Dementia in other diseases classified elsewhere without behavioral disturbance: Secondary | ICD-10-CM | POA: Insufficient documentation

## 2016-09-29 DIAGNOSIS — L97522 Non-pressure chronic ulcer of other part of left foot with fat layer exposed: Secondary | ICD-10-CM | POA: Diagnosis not present

## 2016-09-29 DIAGNOSIS — I10 Essential (primary) hypertension: Secondary | ICD-10-CM | POA: Diagnosis not present

## 2016-09-29 DIAGNOSIS — G309 Alzheimer's disease, unspecified: Secondary | ICD-10-CM | POA: Insufficient documentation

## 2016-09-29 DIAGNOSIS — Z89511 Acquired absence of right leg below knee: Secondary | ICD-10-CM | POA: Diagnosis not present

## 2016-09-29 DIAGNOSIS — E1141 Type 2 diabetes mellitus with diabetic mononeuropathy: Secondary | ICD-10-CM | POA: Diagnosis not present

## 2016-09-29 DIAGNOSIS — E11621 Type 2 diabetes mellitus with foot ulcer: Secondary | ICD-10-CM | POA: Diagnosis not present

## 2016-09-29 DIAGNOSIS — Z794 Long term (current) use of insulin: Secondary | ICD-10-CM | POA: Diagnosis not present

## 2016-10-06 DIAGNOSIS — E11621 Type 2 diabetes mellitus with foot ulcer: Secondary | ICD-10-CM | POA: Diagnosis not present

## 2016-10-13 DIAGNOSIS — E11621 Type 2 diabetes mellitus with foot ulcer: Secondary | ICD-10-CM | POA: Diagnosis not present

## 2016-10-20 DIAGNOSIS — E11621 Type 2 diabetes mellitus with foot ulcer: Secondary | ICD-10-CM | POA: Diagnosis not present

## 2016-10-27 ENCOUNTER — Encounter (HOSPITAL_BASED_OUTPATIENT_CLINIC_OR_DEPARTMENT_OTHER): Payer: Medicare Other | Attending: Surgery

## 2016-10-27 DIAGNOSIS — I10 Essential (primary) hypertension: Secondary | ICD-10-CM | POA: Insufficient documentation

## 2016-10-27 DIAGNOSIS — L97522 Non-pressure chronic ulcer of other part of left foot with fat layer exposed: Secondary | ICD-10-CM | POA: Insufficient documentation

## 2016-10-27 DIAGNOSIS — G309 Alzheimer's disease, unspecified: Secondary | ICD-10-CM | POA: Insufficient documentation

## 2016-10-27 DIAGNOSIS — F0280 Dementia in other diseases classified elsewhere without behavioral disturbance: Secondary | ICD-10-CM | POA: Diagnosis not present

## 2016-10-27 DIAGNOSIS — E11621 Type 2 diabetes mellitus with foot ulcer: Secondary | ICD-10-CM | POA: Insufficient documentation

## 2016-10-27 DIAGNOSIS — E1141 Type 2 diabetes mellitus with diabetic mononeuropathy: Secondary | ICD-10-CM | POA: Diagnosis not present

## 2016-10-27 DIAGNOSIS — E1140 Type 2 diabetes mellitus with diabetic neuropathy, unspecified: Secondary | ICD-10-CM | POA: Insufficient documentation

## 2016-11-03 DIAGNOSIS — E11621 Type 2 diabetes mellitus with foot ulcer: Secondary | ICD-10-CM | POA: Diagnosis not present

## 2016-11-10 DIAGNOSIS — E11621 Type 2 diabetes mellitus with foot ulcer: Secondary | ICD-10-CM | POA: Diagnosis not present

## 2016-11-17 DIAGNOSIS — E11621 Type 2 diabetes mellitus with foot ulcer: Secondary | ICD-10-CM | POA: Diagnosis not present

## 2016-11-24 DIAGNOSIS — E11621 Type 2 diabetes mellitus with foot ulcer: Secondary | ICD-10-CM | POA: Diagnosis not present

## 2016-12-01 ENCOUNTER — Encounter (HOSPITAL_BASED_OUTPATIENT_CLINIC_OR_DEPARTMENT_OTHER): Payer: Medicare Other | Attending: Surgery

## 2016-12-01 DIAGNOSIS — I10 Essential (primary) hypertension: Secondary | ICD-10-CM | POA: Diagnosis not present

## 2016-12-01 DIAGNOSIS — E11621 Type 2 diabetes mellitus with foot ulcer: Secondary | ICD-10-CM | POA: Diagnosis present

## 2016-12-01 DIAGNOSIS — F0390 Unspecified dementia without behavioral disturbance: Secondary | ICD-10-CM | POA: Diagnosis not present

## 2016-12-01 DIAGNOSIS — L97522 Non-pressure chronic ulcer of other part of left foot with fat layer exposed: Secondary | ICD-10-CM | POA: Diagnosis not present

## 2016-12-01 DIAGNOSIS — E1141 Type 2 diabetes mellitus with diabetic mononeuropathy: Secondary | ICD-10-CM | POA: Diagnosis not present

## 2016-12-01 DIAGNOSIS — E1140 Type 2 diabetes mellitus with diabetic neuropathy, unspecified: Secondary | ICD-10-CM | POA: Insufficient documentation

## 2016-12-03 ENCOUNTER — Encounter (HOSPITAL_COMMUNITY): Payer: Self-pay

## 2016-12-03 ENCOUNTER — Emergency Department (HOSPITAL_COMMUNITY)
Admission: EM | Admit: 2016-12-03 | Discharge: 2016-12-03 | Disposition: A | Discharge status: Home / Self Care | Payer: Medicare Other | Attending: Emergency Medicine | Admitting: Emergency Medicine

## 2016-12-03 DIAGNOSIS — I959 Hypotension, unspecified: Secondary | ICD-10-CM | POA: Diagnosis present

## 2016-12-03 DIAGNOSIS — E119 Type 2 diabetes mellitus without complications: Secondary | ICD-10-CM | POA: Insufficient documentation

## 2016-12-03 DIAGNOSIS — Z87891 Personal history of nicotine dependence: Secondary | ICD-10-CM | POA: Diagnosis not present

## 2016-12-03 DIAGNOSIS — Z79899 Other long term (current) drug therapy: Secondary | ICD-10-CM | POA: Diagnosis not present

## 2016-12-03 DIAGNOSIS — Z794 Long term (current) use of insulin: Secondary | ICD-10-CM | POA: Insufficient documentation

## 2016-12-03 DIAGNOSIS — Z7982 Long term (current) use of aspirin: Secondary | ICD-10-CM | POA: Diagnosis not present

## 2016-12-03 DIAGNOSIS — F0390 Unspecified dementia without behavioral disturbance: Secondary | ICD-10-CM | POA: Insufficient documentation

## 2016-12-03 LAB — CBG MONITORING, ED
GLUCOSE-CAPILLARY: 61 mg/dL — AB (ref 65–99)
Glucose-Capillary: 129 mg/dL — ABNORMAL HIGH (ref 65–99)

## 2016-12-03 NOTE — ED Notes (Signed)
Patient provided with second [place] sandwich and orange juice. Patient daughter at bedside. Updated patient and daughter of POC.

## 2016-12-03 NOTE — ED Notes (Signed)
Patient provided with turkey sandwich and apple juice.  

## 2016-12-03 NOTE — ED Triage Notes (Addendum)
Patient BIB EMS from [facility]. Patient was seen by her PCP this morning and was noted to have a BP of 85/52 at approx 1000. By the time EMS responded, patient's BP for EMS was 147/70. Patient denies CP/SOB/Chills/Pain of any kind at this time. Patient has history of Alzheimers and a right leg amputation.

## 2016-12-03 NOTE — ED Notes (Signed)
This nurse spoke with Caren Griffins, RN at [facility]. Per report, the patient was receiving regularly scheduled vital signs when the patient's BP was noted to be low by the CMA on the automatic monitor. The CMA repeated the BP on the patients other arm, and received a similar number. RN was called to the bedside and x2 manual BP's were taken. Per report, the highest systolic recorded was 85 and the highest diastolic was 45. Patient's PCP was called and ordered the patient to be sent to the ER for work up. ED MD [place] notified.

## 2016-12-03 NOTE — ED Provider Notes (Signed)
[facility] DEPT Provider Note  CSN: [id number] Arrival date & time: 12/03/16 1226  Chief Complaint(s) Hypotension  HPI Judith [name] is a 76 y.o. female who presents from skilled nursing facility for low blood pressures.  Per EMS they were rounding on the patient this morning when he noted systolic blood pressures in the 80s.  They repeated were confirmed.  The patient was at her baseline mental status and had no complaints at that time.  They called EMS and upon their arrival the patient's blood pressure was within normal limits.  CBG within normal limits.  They spoke with the facility physician who recommended she come to the emergency department for evaluation.  Upon arrival here the patient did not no the reason she was here but denied any complaints.  She does have a history of Alzheimer making history obtaining limited.  We spoke with the skilled nursing facility who corroborated the story above.  No additional history provided.  Remainder of history, ROS, and physical exam limited due to patient's condition (Alzheimer). Additional information was obtained from EMS and sNF.   Level V Caveat.    HPI  Past Medical History Past Medical History:  Diagnosis Date  . Anxiety   . Arthritis    "left leg" (05/17/2013)  . Asthma   . 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 Closed right ankle fracture   . Colitis 09/06/02  . Diabetes mellitus without complication ([place])   . Fracture of femur, distal, right, closed ([place]) 03/01/2012  . GERD (gastroesophageal reflux disease)   . MWUXLKGM(010.2)    "probably weekly" (05/17/2013)  . Hemorrhoid   . High cholesterol   . History of stomach ulcers   . Hyperlipidemia   . Hypertension   . Insulin dependent diabetes mellitus ([place]) 03/01/2012  . Obesity (BMI 30-39.9) 03/03/2012  . Pneumonia    "once" (05/17/2013)  . Proctocolitis 09/04/02   Patient Active Problem List   Diagnosis Date Noted  . Uterine  hypertrophy 06/11/2015  . Kidney lesion, native, bilateral 06/11/2015  . Protein-calorie malnutrition, severe ([place]) 09/22/2013  . Dementia 09/21/2013  . Constipation 09/21/2013  . Rectal bleeding 09/21/2013  . Fecal impaction ([place]) 09/21/2013  . BRBPR (bright red blood per rectum) 09/21/2013  . Altered mental status 05/17/2013  . Altered mental state 05/17/2013  . Delirium due to another medical condition 02/18/2013  . Acute blood loss anemia 03/03/2012  . 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 Fall 03/03/2012  . Obesity (BMI 30-39.9) 03/03/2012  . Poor nutrition 03/03/2012  . Fracture of femur, distal, right, closed ([place]) 03/01/2012  . UTI (lower urinary tract infection) 03/01/2012  . Insulin dependent diabetes mellitus ([place]) 03/01/2012  . Hypertension    Home Medication(s) Prior to Admission medications   Medication Sig Start Date End Date Taking? Authorizing Provider  acetaminophen (TYLENOL) 650 MG CR tablet Take 650 mg by mouth every 4 (four) hours as needed for pain.   Yes [provider]  Amino Acids-Protein Hydrolys (FEEDING SUPPLEMENT, PRO-STAT SUGAR FREE 64,) LIQD Take 30 mLs by mouth 2 (two) times daily.   Yes [provider]  aspirin 325 MG tablet Take 325 mg by mouth daily with breakfast.    Yes [provider]  atorvastatin (LIPITOR) 20 MG tablet Take 20 mg by mouth every evening.    Yes [provider]  chlorhexidine (PERIDEX) 0.12 % solution Use as directed 15 mLs in the mouth or throat 2 (two)  times daily. For mouth sores after brushing teeth. Swish for seconds. Do Not rinse mouth or brush teeth after use.   Yes [provider]  cloNIDine (CATAPRES) 0.1 MG tablet Take 0.1 mg by mouth 2 (two) times daily.   Yes [provider]  docusate sodium (COLACE) 100 MG capsule Take 1 capsule (100 mg total) by mouth every 12 (twelve) hours. Patient taking differently: Take 100 mg by mouth 2 (two) times daily.  05/26/15  Yes Pollina,  Gwenyth Allegra, MD  hydrocerin (EUCERIN) CREA Apply 1 application topically 2 (two) times daily. Applied to bilateral legs twice a day   Yes [provider]  hydrocerin (EUCERIN) CREA Apply 1 application topically 2 (two) times daily as needed (for rash on bilateral legs).   Yes [provider]  hydrocortisone (ANUSOL-HC) 2.5 % rectal cream Place 1 application rectally every 12 (twelve) hours as needed for hemorrhoids or itching.   Yes [provider]  hydrocortisone cream (HYDROCORTISONE MAX ST) 1 % Apply 1 application topically 3 (three) times daily as needed for itching.   Yes [provider]  insulin aspart (NOVOLOG) 100 UNIT/ML injection Inject 0-15 Units into the skin 3 (three) times daily before meals. Per sliding scale   0-150 = 0 units 151-200 = 3 units 201-250 = 5 units 251-300 = 7 units 301-350 = 9 units 351-400 = 11 units 401-450 = 13 units 451-500 = 15 units and call provider   Yes [provider]  insulin detemir (LEVEMIR) 100 UNIT/ML injection Inject 0.1 mLs (10 Units total) into the skin daily with lunch. Patient taking differently: Inject 12 Units at bedtime into the skin.  09/22/13  Yes [name], [name], PA-C  lisinopril (PRINIVIL,ZESTRIL) 40 MG tablet Take 40 mg by mouth daily with breakfast.    Yes [provider]  loperamide (IMODIUM) 2 MG capsule Take 2-4 mg by mouth as needed for diarrhea or loose stools.   Yes [provider]  LORazepam (ATIVAN) 0.5 MG tablet Take 1 tablet (0.5 mg total) by mouth every 6 (six) hours as needed for anxiety. 04/28/14  Yes Larence Penning, MD  magnesium hydroxide (MILK OF MAGNESIA) 400 MG/5ML suspension Take 30 mLs by mouth daily as needed for mild constipation.   Yes [provider]  metoprolol succinate (TOPROL-XL) 50 MG 24 hr tablet Take 75 mg by mouth daily with breakfast. Take with or immediately following a meal.    Yes [provider]  polyethylene glycol (MIRALAX  / GLYCOLAX) packet Take 17 g by mouth daily. Patient taking differently: Take 17 g by mouth daily with breakfast.  09/22/13  Yes [name], [name], PA-C  ranitidine (ZANTAC) 150 MG tablet Take 150 mg by mouth every evening.    Yes [provider]  vitamin C (ASCORBIC ACID) 500 MG tablet Take 500 mg by mouth 2 (two) times daily.   Yes [provider]  zinc gluconate 50 MG tablet Take 50 mg by mouth daily with breakfast.   Yes [provider]  Past Surgical History Past Surgical History:  Procedure Laterality Date  . ABDOMINAL HYSTERECTOMY    . BELOW KNEE LEG AMPUTATION Right 02/2008  . BREAST LUMPECTOMY Bilateral    "not cancer"  . CATARACT EXTRACTION W/ INTRAOCULAR LENS  IMPLANT, BILATERAL Bilateral    Family History Family History  Problem Relation Age of Onset  . Diabetes Mother   . Diabetes Father   . Diabetes Sister        x3  . Diabetes Brother        x4  . Colon cancer Neg Hx   . Colon polyps Neg Hx     Social History Social History   Tobacco Use  . Smoking status: Former Smoker    Packs/day: 1.00    Years: 5.00    Pack years: 5.00    Last attempt to quit: 01/26/1993    Years since quitting: 23.8  . Smokeless tobacco: Former Systems developer  . Tobacco comment: "quit smoking at ~ age 51"  Substance Use Topics  . Alcohol use: No  . Drug use: No   Allergies Patient has no known allergies.  Review of Systems Review of Systems  Unable to perform ROS: Dementia   Physical Exam Vital Signs  I have reviewed the triage vital signs BP (!) 161/70   Pulse 76   Temp 97.7 F (36.5 C) (Oral)   Resp (!) 24   SpO2 96%   Physical Exam  Constitutional: She appears well-developed and well-nourished. No distress.  HENT:  Head: Normocephalic and atraumatic.  Nose: Nose normal.  Eyes: Conjunctivae and EOM are normal. Pupils are  equal, round, and reactive to light. Right eye exhibits no discharge. Left eye exhibits no discharge. No scleral icterus.  Neck: Normal range of motion. Neck supple.  Cardiovascular: Normal rate and regular rhythm. Exam reveals no gallop and no friction rub.  No murmur heard. Pulmonary/Chest: Effort normal and breath sounds normal. No stridor. No respiratory distress. She has no rales.  Abdominal: Soft. She exhibits no distension. There is no tenderness. There is no rigidity, no rebound, no guarding and no CVA tenderness.  Musculoskeletal: She exhibits no edema or tenderness.       Legs: Neurological: She is alert. She is disoriented.  Skin: Skin is warm and dry. No rash noted. She is not diaphoretic. No erythema.  Psychiatric: She has a normal mood and affect.  Vitals reviewed.   ED Results and Treatments Labs (all labs ordered are listed, but only abnormal results are displayed) Labs Reviewed  CBG MONITORING, ED - Abnormal; Notable for the following components:      Result Value   Glucose-Capillary 61 (*)    All other components within normal limits  CBG MONITORING, ED - Abnormal; Notable for the following components:   Glucose-Capillary 129 (*)    All other components within normal limits  EKG  EKG Interpretation  Date/Time: 12/03/2016   Ventricular Rate:   77 PR Interval:  305 QRS Duration:  90 QT Interval:   424 QTC Calculation:  480 R Axis:     Text Interpretation: Normal sinus rhythm.  Multiple PVCs.  Type I AV nodal block.  No evidence of acute ischemia.     Radiology No results found. Pertinent labs & imaging results that were available during my care of the patient were reviewed by me and considered in my medical decision making (see chart for details).  Medications Ordered in ED Medications - No data to display                                                                                                                                   Procedures Procedures  (including critical care time)  Medical Decision Making / ED Course I have reviewed the nursing notes for this encounter and the patient's prior records (if available in EHR or on provided paperwork).    Patient monitored for several hours with no evidence.  EKG nonischemic.  CBG repeated patient had low blood sugars which responded well to oral nutrition.  She remained hemodynamically stable and feel she is appropriate for discharge back to facility.  The patient appears reasonably screened and/or stabilized for discharge and I doubt any other medical condition or other [facility] requiring further screening, evaluation, or treatment in the ED at this time prior to discharge.   Final Clinical Impression(s) / ED Diagnoses Final diagnoses:  Hypotension, unspecified hypotension type   Disposition: Discharge  Condition: Good  I have discussed the results, Dx and Tx plan with the patient's son who expressed understanding and agree(s) with the plan. Discharge instructions discussed at great length. The patient's son was given strict return precautions who verbalized understanding of the instructions. No further questions at time of discharge.    ED Discharge Orders    None       Follow Up: Nolene Ebbs, MD [place] [address] [phone]  Schedule an appointment as soon as possible for a visit  As needed      This chart was dictated using voice recognition software.  Despite best efforts to proofread,  errors can occur which can change the documentation meaning.   Fatima Blank, MD 12/03/16 [phone]

## 2016-12-08 DIAGNOSIS — E11621 Type 2 diabetes mellitus with foot ulcer: Secondary | ICD-10-CM | POA: Diagnosis not present

## 2016-12-15 DIAGNOSIS — E11621 Type 2 diabetes mellitus with foot ulcer: Secondary | ICD-10-CM | POA: Diagnosis not present

## 2016-12-22 DIAGNOSIS — E11621 Type 2 diabetes mellitus with foot ulcer: Secondary | ICD-10-CM | POA: Diagnosis not present

## 2016-12-24 ENCOUNTER — Emergency Department (HOSPITAL_COMMUNITY): Payer: Medicare Other

## 2016-12-24 ENCOUNTER — Emergency Department (HOSPITAL_COMMUNITY)
Admission: EM | Admit: 2016-12-24 | Discharge: 2016-12-25 | Disposition: A | Discharge status: Skilled Nursing Facility | Payer: Medicare Other | Attending: Emergency Medicine | Admitting: Emergency Medicine

## 2016-12-24 ENCOUNTER — Other Ambulatory Visit: Payer: Self-pay

## 2016-12-24 DIAGNOSIS — M79605 Pain in left leg: Secondary | ICD-10-CM | POA: Insufficient documentation

## 2016-12-24 DIAGNOSIS — Z794 Long term (current) use of insulin: Secondary | ICD-10-CM | POA: Insufficient documentation

## 2016-12-24 DIAGNOSIS — F0390 Unspecified dementia without behavioral disturbance: Secondary | ICD-10-CM | POA: Insufficient documentation

## 2016-12-24 DIAGNOSIS — N3001 Acute cystitis with hematuria: Secondary | ICD-10-CM | POA: Diagnosis not present

## 2016-12-24 DIAGNOSIS — J45909 Unspecified asthma, uncomplicated: Secondary | ICD-10-CM | POA: Diagnosis not present

## 2016-12-24 DIAGNOSIS — Z87891 Personal history of nicotine dependence: Secondary | ICD-10-CM | POA: Diagnosis not present

## 2016-12-24 DIAGNOSIS — E7800 Pure hypercholesterolemia, unspecified: Secondary | ICD-10-CM | POA: Insufficient documentation

## 2016-12-24 DIAGNOSIS — Z79899 Other long term (current) drug therapy: Secondary | ICD-10-CM | POA: Diagnosis not present

## 2016-12-24 DIAGNOSIS — E119 Type 2 diabetes mellitus without complications: Secondary | ICD-10-CM | POA: Insufficient documentation

## 2016-12-24 DIAGNOSIS — I10 Essential (primary) hypertension: Secondary | ICD-10-CM | POA: Insufficient documentation

## 2016-12-24 DIAGNOSIS — E785 Hyperlipidemia, unspecified: Secondary | ICD-10-CM | POA: Diagnosis not present

## 2016-12-24 DIAGNOSIS — Z7982 Long term (current) use of aspirin: Secondary | ICD-10-CM | POA: Diagnosis not present

## 2016-12-24 DIAGNOSIS — R4182 Altered mental status, unspecified: Secondary | ICD-10-CM | POA: Diagnosis present

## 2016-12-24 LAB — CBC
HCT: 35.4 % — ABNORMAL LOW (ref 36.0–46.0)
Hemoglobin: 11.3 g/dL — ABNORMAL LOW (ref 12.0–15.0)
MCH: 27.2 pg (ref 26.0–34.0)
MCHC: 31.9 g/dL (ref 30.0–36.0)
MCV: 85.3 fL (ref 78.0–100.0)
PLATELETS: 211 10*3/uL (ref 150–400)
RBC: 4.15 MIL/uL (ref 3.87–5.11)
RDW: 15.5 % (ref 11.5–15.5)
WBC: 7 10*3/uL (ref 4.0–10.5)

## 2016-12-24 LAB — COMPREHENSIVE METABOLIC PANEL
ALK PHOS: 98 U/L (ref 38–126)
ALT: 13 U/L — AB (ref 14–54)
AST: 18 U/L (ref 15–41)
Albumin: 3.6 g/dL (ref 3.5–5.0)
Anion gap: 9 (ref 5–15)
BILIRUBIN TOTAL: 0.8 mg/dL (ref 0.3–1.2)
BUN: 31 mg/dL — AB (ref 6–20)
CALCIUM: 9.5 mg/dL (ref 8.9–10.3)
CO2: 24 mmol/L (ref 22–32)
CREATININE: 1.64 mg/dL — AB (ref 0.44–1.00)
Chloride: 106 mmol/L (ref 101–111)
GFR calc Af Amer: 34 mL/min — ABNORMAL LOW (ref >60)
GFR, EST NON AFRICAN AMERICAN: 29 mL/min — AB (ref >60)
Glucose, Bld: 251 mg/dL — ABNORMAL HIGH (ref 65–99)
Potassium: 4.6 mmol/L (ref 3.5–5.1)
Sodium: 139 mmol/L (ref 135–145)
TOTAL PROTEIN: 7.5 g/dL (ref 6.5–8.1)

## 2016-12-24 MED ORDER — SODIUM CHLORIDE 0.9 % IV BOLUS (SEPSIS)
1000.0000 mL | Freq: Once | INTRAVENOUS | Status: AC
  Administered 2016-12-24: 1000 mL via INTRAVENOUS

## 2016-12-24 NOTE — ED Provider Notes (Signed)
[facility] DEPT Provider Note   CSN: [id number] Arrival date & time: 12/24/16  2123     History   Chief Complaint Chief Complaint  Patient presents with  . Leg Swelling  . Altered Mental Status  level 5 caveat due to dementia HPI Judith [name] is a 76 y.o. female.  The history is provided by the patient. The history is limited by the condition of the patient.  Altered Mental Status   This is a new problem. Episode onset: Unknown. The problem has not changed since onset.Associated symptoms include confusion.   Patient presents from nursing facility for 2 issues  Apparently the patient sustained an injury to her left leg yesterday when the Lucianne Lei Apparently she hit her shin on something hard while on the [place]. Concerned that she has an injury to her leg  Some concern that she is having worsening altered mental status. Facility is requesting a urine test  Patient is awake and alert, reports she is just in the ER visiting  Past Medical History:  Diagnosis Date  . Anxiety   . Arthritis    "left leg" (05/17/2013)  . Asthma   . 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 Closed right ankle fracture   . Colitis 09/06/02  . Diabetes mellitus without complication ([place])   . Fracture of femur, distal, right, closed ([place]) 03/01/2012  . GERD (gastroesophageal reflux disease)   . FTDDUKGU(542.7)    "probably weekly" (05/17/2013)  . Hemorrhoid   . High cholesterol   . History of stomach ulcers   . Hyperlipidemia   . Hypertension   . Insulin dependent diabetes mellitus ([place]) 03/01/2012  . Obesity (BMI 30-39.9) 03/03/2012  . Pneumonia    "once" (05/17/2013)  . Proctocolitis 09/04/02    Patient Active Problem List   Diagnosis Date Noted  . Uterine hypertrophy 06/11/2015  . Kidney lesion, native, bilateral 06/11/2015  . Protein-calorie malnutrition, severe ([place]) 09/22/2013  . Dementia 09/21/2013  . Constipation 09/21/2013  . Rectal bleeding  09/21/2013  . Fecal impaction ([place]) 09/21/2013  . BRBPR (bright red blood per rectum) 09/21/2013  . Altered mental status 05/17/2013  . Altered mental state 05/17/2013  . Delirium due to another medical condition 02/18/2013  . Acute blood loss anemia 03/03/2012  . 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 Fall 03/03/2012  . Obesity (BMI 30-39.9) 03/03/2012  . Poor nutrition 03/03/2012  . Fracture of femur, distal, right, closed ([place]) 03/01/2012  . UTI (lower urinary tract infection) 03/01/2012  . Insulin dependent diabetes mellitus ([place]) 03/01/2012  . Hypertension     Past Surgical History:  Procedure Laterality Date  . ABDOMINAL HYSTERECTOMY    . BELOW KNEE LEG AMPUTATION Right 02/2008  . BREAST LUMPECTOMY Bilateral    "not cancer"  . CATARACT EXTRACTION W/ INTRAOCULAR LENS  IMPLANT, BILATERAL Bilateral   . ORIF FEMUR FRACTURE  03/01/2012   Procedure: OPEN REDUCTION INTERNAL FIXATION (ORIF) DISTAL FEMUR FRACTURE;  Surgeon: Rozanna Box, MD;  Location: [facility];  Service: Orthopedics;  Laterality: Right;  ORIF right femur    OB History    Gravida Para Term Preterm AB Living   0 0 0 0 0     SAB TAB Ectopic Multiple Live Births   0 0 0           Home Medications    Prior to Admission medications   Medication Sig Start Date End Date Taking? Authorizing Provider  acetaminophen (  TYLENOL) 650 MG CR tablet Take 650 mg by mouth every 4 (four) hours as needed for pain.   Yes [provider]  Amino Acids-Protein Hydrolys (FEEDING SUPPLEMENT, PRO-STAT SUGAR FREE 64,) LIQD Take 30 mLs by mouth 2 (two) times daily.   Yes [provider]  aspirin 325 MG tablet Take 325 mg by mouth daily with breakfast.    Yes [provider]  atorvastatin (LIPITOR) 20 MG tablet Take 20 mg by mouth every evening.    Yes [provider]  chlorhexidine (PERIDEX) 0.12 % solution Use as directed 15 mLs in the mouth or throat 2 (two) times daily. For mouth sores after brushing  teeth. Swish for seconds. Do Not rinse mouth or brush teeth after use.   Yes [provider]  cloNIDine (CATAPRES) 0.1 MG tablet Take 0.1 mg by mouth 2 (two) times daily.   Yes [provider]  Dextrose, Diabetic Use, (INSTA-GLUCOSE) 77.4 % GEL Take 1 vial by mouth daily as needed (Diabetes, Give when CBG is <60).   Yes [provider]  hydrocerin (EUCERIN) CREA Apply 1 application topically 2 (two) times daily. Applied to bilateral legs twice a day   Yes [provider]  insulin aspart (NOVOLOG) 100 UNIT/ML injection Inject 0-15 Units into the skin 3 (three) times daily before meals. Per sliding scale   0-150 = 0 units 151-200 = 3 units 201-250 = 5 units 251-300 = 7 units 301-350 = 9 units 351-400 = 11 units 401-450 = 13 units 451-500 = 15 units and call provider   Yes [provider]  insulin detemir (LEVEMIR) 100 UNIT/ML injection Inject 0.1 mLs (10 Units total) into the skin daily with lunch. Patient taking differently: Inject 12 Units at bedtime into the skin.  09/22/13  Yes [name], [name], PA-C  lisinopril (PRINIVIL,ZESTRIL) 5 MG tablet Take 5 mg by mouth daily.   Yes [provider]  LORazepam (ATIVAN) 0.5 MG tablet Take 1 tablet (0.5 mg total) by mouth every 6 (six) hours as needed for anxiety. 04/28/14  Yes Larence Penning, MD  metoprolol succinate (TOPROL-XL) 50 MG 24 hr tablet Take 75 mg by mouth daily with breakfast. Take with or immediately following a meal.    Yes [provider]  polyethylene glycol (MIRALAX / GLYCOLAX) packet Take 17 g by mouth daily. Patient taking differently: Take 17 g by mouth daily with breakfast.  09/22/13  Yes [name], [name], PA-C  ranitidine (ZANTAC) 150 MG tablet Take 150 mg by mouth every evening.    Yes [provider]  vitamin C (ASCORBIC ACID) 500 MG tablet Take 500 mg by mouth 2 (two) times daily.   Yes [provider]  zinc gluconate 50 MG tablet Take 50 mg by mouth daily  with breakfast.   Yes [provider]  docusate sodium (COLACE) 100 MG capsule Take 1 capsule (100 mg total) by mouth every 12 (twelve) hours. Patient not taking: Reported on 12/24/2016 05/26/15   Orpah Greek, MD  hydrocerin (EUCERIN) CREA Apply 1 application topically 2 (two) times daily as needed (for rash on bilateral legs).    [provider]  hydrocortisone (ANUSOL-HC) 2.5 % rectal cream Place 1 application rectally every 12 (twelve) hours as needed for hemorrhoids or itching.    [provider]  hydrocortisone cream (HYDROCORTISONE MAX ST) 1 % Apply 1 application topically 3 (three) times daily as needed for itching.    [provider]  lisinopril (PRINIVIL,ZESTRIL) 40 MG  tablet Take 40 mg by mouth daily with breakfast.     [provider]  loperamide (IMODIUM) 2 MG capsule Take 2-4 mg by mouth as needed for diarrhea or loose stools.    [provider]  magnesium hydroxide (MILK OF MAGNESIA) 400 MG/5ML suspension Take 30 mLs by mouth daily as needed for mild constipation.    [provider]    Family History Family History  Problem Relation Age of Onset  . Diabetes Mother   . Diabetes Father   . Diabetes Sister        x3  . Diabetes Brother        x4  . Colon cancer Neg Hx   . Colon polyps Neg Hx     Social History Social History   Tobacco Use  . Smoking status: Former Smoker    Packs/day: 1.00    Years: 5.00    Pack years: 5.00    Last attempt to quit: 01/26/1993    Years since quitting: 23.9  . Smokeless tobacco: Former Systems developer  . Tobacco comment: "quit smoking at ~ age 72"  Substance Use Topics  . Alcohol use: No  . Drug use: No     Allergies   Patient has no known allergies.   Review of Systems Review of Systems  Unable to perform ROS: Dementia  Psychiatric/Behavioral: Positive for confusion.     Physical Exam Updated Vital Signs BP (!) 159/64 (BP Location: Left Arm)   Pulse 79    Temp 98.4 F (36.9 C) (Oral)   Resp 18   SpO2 98%   Physical Exam CONSTITUTIONAL: Elderly, smiling, no distress HEAD: Normocephalic/atraumatic EYES: EOMI ENMT: Mucous membranes moist NECK: supple no meningeal signs SPINE/BACK:entire spine nontender CV: S1/S2 noted, no murmurs/rubs/gallops noted LUNGS: Coarse breath sounds bilaterally, no apparent distress ABDOMEN: soft, nontender, obese GU:no cva tenderness NEURO: Pt is awake/alert, extremities x4 She is pleasantly confused, well-appearing, smiling EXTREMITIES: foot is warm to touch, full ROM, see photo Evidence of previous right BKA SKIN: warm, color normal   Patient gave verbal permission to utilize photo for medical documentation only The image was not stored on any personal device       ED Treatments / Results  Labs (all labs ordered are listed, but only abnormal results are displayed) Labs Reviewed  COMPREHENSIVE METABOLIC PANEL - Abnormal; Notable for the following components:      Result Value   Glucose, Bld 251 (*)    BUN 31 (*)    Creatinine, Ser 1.64 (*)    ALT 13 (*)    GFR calc non Af Amer 29 (*)    GFR calc Af Amer 34 (*)    All other components within normal limits  CBC - Abnormal; Notable for the following components:   Hemoglobin 11.3 (*)    HCT 35.4 (*)    All other components within normal limits  URINALYSIS, ROUTINE W REFLEX MICROSCOPIC - Abnormal; Notable for the following components:   Color, Urine AMBER (*)    APPearance TURBID (*)    Glucose, UA 150 (*)    Hgb urine dipstick SMALL (*)    Ketones, ur 5 (*)    Protein, ur 30 (*)    Leukocytes, UA LARGE (*)    Bacteria, UA MANY (*)    All other components within normal limits    EKG  EKG Interpretation None       Radiology Dg Tibia/fibula Left  Result Date: 12/24/2016 CLINICAL DATA:  Page  reports pain and hit the leg yesterday when a wheelchair Lucianne Lei stopped abruptly. Pain about the lower lateral [place]. EXAM: LEFT TIBIA AND  FIBULA - 2 VIEW COMPARISON:  None. FINDINGS: The bones are under mineralized. No evidence of acute fracture. Degenerative change of the knee. Mild soft tissue edema laterally. Advanced vascular calcifications. IMPRESSION: No acute fracture of the left lower leg.  Soft tissue edema. Electronically Signed   By: Jeb Levering M.D.   On: 12/24/2016 22:37    Procedures Procedures (including critical care time)  Medications Ordered in ED Medications  sodium chloride 0.9 % bolus 1,000 mL (1,000 mLs Intravenous New Bag/Given 12/24/16 2348)  cefTRIAXone (ROCEPHIN) 1 g in dextrose 5 % 50 mL IVPB (1 g Intravenous New Bag/Given 12/25/16 0128)     Initial Impression / Assessment and Plan / ED Course  I have reviewed the triage vital signs and the nursing notes.  Pertinent labs & imaging results that were available during my care of the patient were reviewed by me and considered in my medical decision making (see chart for details).     Patient noted to have UTI. Imaging negative She Is awake alert but confused Spoke to nurse at [facility]. Reports she is more confused than normal, and she hit her leg recently that is when to get an x-ray of the leg They were suspicious she had a UTI At this point I feel she is safe and stable to go back To nurse at [facility] reports the wound on her foot is chronic and seen by the wound center Final Clinical Impressions(s) / ED Diagnoses   Final diagnoses:  Acute cystitis with hematuria  Pain of left lower extremity    ED Discharge Orders        Ordered    cephALEXin (KEFLEX) 500 MG capsule  2 times daily     12/25/16 0228       Ripley Fraise, MD 12/25/16 0230

## 2016-12-24 NOTE — ED Triage Notes (Signed)
Per EMS: Pt coming from [facility]). Pt was in the [place] yesterday and the Lucianne Lei stopped hard and pt hit her shin on something hard. Pt has a blue tinted area to the shin where she hit. Pt has palpable pulses. Staff also reports patient is more altered than normal and requests a UA.   BP118/60 HR 80 96% on RA SPO2

## 2016-12-24 NOTE — ED Notes (Signed)
Bed: WA20 Expected date:  Expected time:  Means of arrival:  Comments: EMS pain left leg-swelling mid shin-struck leg at facility-increased confusion possible UTI-76 yo female CBG 233

## 2016-12-25 DIAGNOSIS — N3001 Acute cystitis with hematuria: Secondary | ICD-10-CM | POA: Diagnosis not present

## 2016-12-25 LAB — URINALYSIS, ROUTINE W REFLEX MICROSCOPIC
BILIRUBIN URINE: NEGATIVE
Glucose, UA: 150 mg/dL — AB
KETONES UR: 5 mg/dL — AB
NITRITE: NEGATIVE
Protein, ur: 30 mg/dL — AB
SQUAMOUS EPITHELIAL / LPF: NONE SEEN
Specific Gravity, Urine: 1.021 (ref 1.005–1.030)
pH: 5 (ref 5.0–8.0)

## 2016-12-25 MED ORDER — CEPHALEXIN 500 MG PO CAPS: 500.0000 mg | ORAL_CAPSULE | Freq: Two times a day (BID) | ORAL | 0 refills | Status: DC

## 2016-12-25 MED ORDER — DEXTROSE 5 % IV SOLN
1.0000 g | Freq: Once | INTRAVENOUS | Status: AC
  Administered 2016-12-25: 1 g via INTRAVENOUS
  Filled 2016-12-25: qty 10

## 2016-12-25 NOTE — ED Notes (Signed)
PTAR called for transport.  

## 2016-12-29 ENCOUNTER — Encounter (HOSPITAL_BASED_OUTPATIENT_CLINIC_OR_DEPARTMENT_OTHER): Payer: Medicare Other | Attending: Surgery

## 2016-12-29 DIAGNOSIS — L97422 Non-pressure chronic ulcer of left heel and midfoot with fat layer exposed: Secondary | ICD-10-CM | POA: Diagnosis not present

## 2016-12-29 DIAGNOSIS — E1140 Type 2 diabetes mellitus with diabetic neuropathy, unspecified: Secondary | ICD-10-CM | POA: Insufficient documentation

## 2016-12-29 DIAGNOSIS — I10 Essential (primary) hypertension: Secondary | ICD-10-CM | POA: Insufficient documentation

## 2016-12-29 DIAGNOSIS — D649 Anemia, unspecified: Secondary | ICD-10-CM | POA: Insufficient documentation

## 2016-12-29 DIAGNOSIS — J45909 Unspecified asthma, uncomplicated: Secondary | ICD-10-CM | POA: Diagnosis not present

## 2016-12-29 DIAGNOSIS — L97522 Non-pressure chronic ulcer of other part of left foot with fat layer exposed: Secondary | ICD-10-CM | POA: Diagnosis not present

## 2016-12-29 DIAGNOSIS — E11621 Type 2 diabetes mellitus with foot ulcer: Secondary | ICD-10-CM | POA: Insufficient documentation

## 2016-12-29 DIAGNOSIS — M1990 Unspecified osteoarthritis, unspecified site: Secondary | ICD-10-CM | POA: Insufficient documentation

## 2017-01-12 DIAGNOSIS — E11621 Type 2 diabetes mellitus with foot ulcer: Secondary | ICD-10-CM | POA: Diagnosis not present

## 2017-01-20 DIAGNOSIS — E11621 Type 2 diabetes mellitus with foot ulcer: Secondary | ICD-10-CM | POA: Diagnosis not present

## 2017-01-27 ENCOUNTER — Encounter (HOSPITAL_BASED_OUTPATIENT_CLINIC_OR_DEPARTMENT_OTHER): Payer: Medicare Other | Attending: Physician Assistant

## 2017-01-27 DIAGNOSIS — L97522 Non-pressure chronic ulcer of other part of left foot with fat layer exposed: Secondary | ICD-10-CM | POA: Insufficient documentation

## 2017-01-27 DIAGNOSIS — E1140 Type 2 diabetes mellitus with diabetic neuropathy, unspecified: Secondary | ICD-10-CM | POA: Diagnosis not present

## 2017-01-27 DIAGNOSIS — I10 Essential (primary) hypertension: Secondary | ICD-10-CM | POA: Diagnosis not present

## 2017-01-27 DIAGNOSIS — E11621 Type 2 diabetes mellitus with foot ulcer: Secondary | ICD-10-CM | POA: Insufficient documentation

## 2017-01-27 DIAGNOSIS — E1141 Type 2 diabetes mellitus with diabetic mononeuropathy: Secondary | ICD-10-CM | POA: Diagnosis not present

## 2017-02-02 ENCOUNTER — Encounter (HOSPITAL_BASED_OUTPATIENT_CLINIC_OR_DEPARTMENT_OTHER): Payer: Medicare Other

## 2017-02-02 DIAGNOSIS — E11621 Type 2 diabetes mellitus with foot ulcer: Secondary | ICD-10-CM | POA: Diagnosis not present

## 2017-02-16 DIAGNOSIS — E11621 Type 2 diabetes mellitus with foot ulcer: Secondary | ICD-10-CM | POA: Diagnosis not present

## 2017-02-23 DIAGNOSIS — E11621 Type 2 diabetes mellitus with foot ulcer: Secondary | ICD-10-CM | POA: Diagnosis not present

## 2017-03-02 ENCOUNTER — Encounter (HOSPITAL_BASED_OUTPATIENT_CLINIC_OR_DEPARTMENT_OTHER): Payer: Medicare Other | Attending: Internal Medicine

## 2017-03-02 DIAGNOSIS — B9561 Methicillin susceptible Staphylococcus aureus infection as the cause of diseases classified elsewhere: Secondary | ICD-10-CM | POA: Diagnosis not present

## 2017-03-02 DIAGNOSIS — E11621 Type 2 diabetes mellitus with foot ulcer: Secondary | ICD-10-CM | POA: Insufficient documentation

## 2017-03-02 DIAGNOSIS — I10 Essential (primary) hypertension: Secondary | ICD-10-CM | POA: Insufficient documentation

## 2017-03-02 DIAGNOSIS — L97526 Non-pressure chronic ulcer of other part of left foot with bone involvement without evidence of necrosis: Secondary | ICD-10-CM | POA: Insufficient documentation

## 2017-03-02 DIAGNOSIS — E1141 Type 2 diabetes mellitus with diabetic mononeuropathy: Secondary | ICD-10-CM | POA: Diagnosis not present

## 2017-03-09 ENCOUNTER — Other Ambulatory Visit (HOSPITAL_COMMUNITY)
Admission: RE | Admit: 2017-03-09 | Discharge: 2017-03-09 | Disposition: A | Discharge status: Home / Self Care | Payer: Medicare Other | Source: Other Acute Inpatient Hospital | Attending: Internal Medicine | Admitting: Internal Medicine

## 2017-03-09 DIAGNOSIS — Z1620 Resistance to unspecified antibiotic: Secondary | ICD-10-CM | POA: Insufficient documentation

## 2017-03-09 DIAGNOSIS — B9689 Other specified bacterial agents as the cause of diseases classified elsewhere: Secondary | ICD-10-CM | POA: Insufficient documentation

## 2017-03-09 DIAGNOSIS — E11621 Type 2 diabetes mellitus with foot ulcer: Secondary | ICD-10-CM | POA: Insufficient documentation

## 2017-03-09 DIAGNOSIS — L97522 Non-pressure chronic ulcer of other part of left foot with fat layer exposed: Secondary | ICD-10-CM | POA: Insufficient documentation

## 2017-03-09 DIAGNOSIS — B9561 Methicillin susceptible Staphylococcus aureus infection as the cause of diseases classified elsewhere: Secondary | ICD-10-CM | POA: Diagnosis not present

## 2017-03-09 DIAGNOSIS — E1141 Type 2 diabetes mellitus with diabetic mononeuropathy: Secondary | ICD-10-CM | POA: Insufficient documentation

## 2017-03-09 DIAGNOSIS — L97422 Non-pressure chronic ulcer of left heel and midfoot with fat layer exposed: Secondary | ICD-10-CM | POA: Insufficient documentation

## 2017-03-12 LAB — AEROBIC CULTURE W GRAM STAIN (SUPERFICIAL SPECIMEN)

## 2017-03-12 LAB — AEROBIC CULTURE  (SUPERFICIAL SPECIMEN)

## 2017-03-16 ENCOUNTER — Other Ambulatory Visit: Payer: Self-pay | Admitting: Internal Medicine

## 2017-03-16 DIAGNOSIS — E11621 Type 2 diabetes mellitus with foot ulcer: Secondary | ICD-10-CM | POA: Diagnosis not present

## 2017-03-16 DIAGNOSIS — L97521 Non-pressure chronic ulcer of other part of left foot limited to breakdown of skin: Secondary | ICD-10-CM

## 2017-03-18 ENCOUNTER — Other Ambulatory Visit: Payer: Self-pay

## 2017-03-18 ENCOUNTER — Emergency Department (HOSPITAL_COMMUNITY)
Admission: EM | Admit: 2017-03-18 | Discharge: 2017-03-18 | Disposition: A | Discharge status: Home / Self Care | Payer: Medicare Other | Attending: Emergency Medicine | Admitting: Emergency Medicine

## 2017-03-18 ENCOUNTER — Encounter (HOSPITAL_COMMUNITY): Payer: Self-pay | Admitting: Emergency Medicine

## 2017-03-18 DIAGNOSIS — R55 Syncope and collapse: Secondary | ICD-10-CM | POA: Insufficient documentation

## 2017-03-18 DIAGNOSIS — Z87891 Personal history of nicotine dependence: Secondary | ICD-10-CM | POA: Insufficient documentation

## 2017-03-18 DIAGNOSIS — E119 Type 2 diabetes mellitus without complications: Secondary | ICD-10-CM | POA: Insufficient documentation

## 2017-03-18 DIAGNOSIS — R531 Weakness: Secondary | ICD-10-CM | POA: Insufficient documentation

## 2017-03-18 DIAGNOSIS — J45909 Unspecified asthma, uncomplicated: Secondary | ICD-10-CM | POA: Diagnosis not present

## 2017-03-18 DIAGNOSIS — Z794 Long term (current) use of insulin: Secondary | ICD-10-CM | POA: Diagnosis not present

## 2017-03-18 DIAGNOSIS — Z79899 Other long term (current) drug therapy: Secondary | ICD-10-CM | POA: Diagnosis not present

## 2017-03-18 DIAGNOSIS — I10 Essential (primary) hypertension: Secondary | ICD-10-CM | POA: Insufficient documentation

## 2017-03-18 LAB — URINALYSIS, ROUTINE W REFLEX MICROSCOPIC
BILIRUBIN URINE: NEGATIVE
Glucose, UA: NEGATIVE mg/dL
HGB URINE DIPSTICK: NEGATIVE
Ketones, ur: 5 mg/dL — AB
Leukocytes, UA: NEGATIVE
Nitrite: NEGATIVE
Protein, ur: NEGATIVE mg/dL
SPECIFIC GRAVITY, URINE: 1.017 (ref 1.005–1.030)
pH: 5 (ref 5.0–8.0)

## 2017-03-18 LAB — CBC WITH DIFFERENTIAL/PLATELET
Basophils Absolute: 0 10*3/uL (ref 0.0–0.1)
Basophils Relative: 1 %
EOS ABS: 0 10*3/uL (ref 0.0–0.7)
Eosinophils Relative: 1 %
HEMATOCRIT: 35 % — AB (ref 36.0–46.0)
HEMOGLOBIN: 10.8 g/dL — AB (ref 12.0–15.0)
LYMPHS ABS: 1 10*3/uL (ref 0.7–4.0)
Lymphocytes Relative: 15 %
MCH: 26.5 pg (ref 26.0–34.0)
MCHC: 30.9 g/dL (ref 30.0–36.0)
MCV: 85.8 fL (ref 78.0–100.0)
MONO ABS: 0.3 10*3/uL (ref 0.1–1.0)
MONOS PCT: 5 %
NEUTROS PCT: 80 %
Neutro Abs: 5.3 10*3/uL (ref 1.7–7.7)
Platelets: 249 10*3/uL (ref 150–400)
RBC: 4.08 MIL/uL (ref 3.87–5.11)
RDW: 15 % (ref 11.5–15.5)
WBC: 6.7 10*3/uL (ref 4.0–10.5)

## 2017-03-18 LAB — COMPREHENSIVE METABOLIC PANEL
ALBUMIN: 3.1 g/dL — AB (ref 3.5–5.0)
ALT: 8 U/L — AB (ref 14–54)
AST: 20 U/L (ref 15–41)
Alkaline Phosphatase: 87 U/L (ref 38–126)
Anion gap: 11 (ref 5–15)
BUN: 32 mg/dL — ABNORMAL HIGH (ref 6–20)
CALCIUM: 8.9 mg/dL (ref 8.9–10.3)
CHLORIDE: 106 mmol/L (ref 101–111)
CO2: 21 mmol/L — AB (ref 22–32)
Creatinine, Ser: 1.41 mg/dL — ABNORMAL HIGH (ref 0.44–1.00)
GFR calc non Af Amer: 35 mL/min — ABNORMAL LOW (ref >60)
GFR, EST AFRICAN AMERICAN: 40 mL/min — AB (ref >60)
GLUCOSE: 175 mg/dL — AB (ref 65–99)
Potassium: 4.1 mmol/L (ref 3.5–5.1)
SODIUM: 138 mmol/L (ref 135–145)
Total Bilirubin: 0.4 mg/dL (ref 0.3–1.2)
Total Protein: 6.6 g/dL (ref 6.5–8.1)

## 2017-03-18 LAB — CBG MONITORING, ED: Glucose-Capillary: 149 mg/dL — ABNORMAL HIGH (ref 65–99)

## 2017-03-18 NOTE — ED Notes (Signed)
PTAR called for transport.  

## 2017-03-18 NOTE — Discharge Instructions (Signed)
RETURN TO ER IF ANY FEVERS, SUDDEN CHANGES IN BEHAVIOR, VOMITING, BREATHING PROBLEMS, OR ABDOMINAL PAIN.

## 2017-03-18 NOTE — ED Provider Notes (Signed)
[facility] EMERGENCY DEPARTMENT Provider Note   CSN: [id number] Arrival date & time: 03/18/17  1620     History   Chief Complaint Chief Complaint  Patient presents with  . Weakness    HPI Judith [name] is a 77 y.o. female.  77 year old female with past medical history below including dementia who presents with weakness.  Per EMS, the patient was at her nursing facility sitting in her wheelchair and reportedly slumped over in her chair, possible brief loss of consciousness.  She was pale and sweaty but awake when EMS arrived.  Her initial blood glucose was 90.  She was given orange juice and glucose tablets and recheck was 170.  Nursing facility denied any fevers.  For me, the patient denies any complaints.  Specifically, she states no chest pain, breathing problems, or abdominal pain.  LEVEL 5 CAVEAT DUE TO DEMENTIA   The history is provided by the patient, the nursing home and the EMS personnel.  Weakness     Past Medical History:  Diagnosis Date  . Anxiety   . Arthritis    "left leg" (05/17/2013)  . Asthma   . 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 Closed right ankle fracture   . Colitis 09/06/02  . Diabetes mellitus without complication ([place])   . Fracture of femur, distal, right, closed ([place]) 03/01/2012  . GERD (gastroesophageal reflux disease)   . ZCHYIFOY(774.1)    "probably weekly" (05/17/2013)  . Hemorrhoid   . High cholesterol   . History of stomach ulcers   . Hyperlipidemia   . Hypertension   . Insulin dependent diabetes mellitus ([place]) 03/01/2012  . Obesity (BMI 30-39.9) 03/03/2012  . Pneumonia    "once" (05/17/2013)  . Proctocolitis 09/04/02    Patient Active Problem List   Diagnosis Date Noted  . Uterine hypertrophy 06/11/2015  . Kidney lesion, native, bilateral 06/11/2015  . Protein-calorie malnutrition, severe ([place]) 09/22/2013  . Dementia 09/21/2013  . Constipation 09/21/2013  . Rectal bleeding 09/21/2013  . Fecal impaction  ([place]) 09/21/2013  . BRBPR (bright red blood per rectum) 09/21/2013  . Altered mental status 05/17/2013  . Altered mental state 05/17/2013  . Delirium due to another medical condition 02/18/2013  . Acute blood loss anemia 03/03/2012  . 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 Fall 03/03/2012  . Obesity (BMI 30-39.9) 03/03/2012  . Poor nutrition 03/03/2012  . Fracture of femur, distal, right, closed ([place]) 03/01/2012  . UTI (lower urinary tract infection) 03/01/2012  . Insulin dependent diabetes mellitus ([place]) 03/01/2012  . Hypertension     Past Surgical History:  Procedure Laterality Date  . ABDOMINAL HYSTERECTOMY    . BELOW KNEE LEG AMPUTATION Right 02/2008  . BREAST LUMPECTOMY Bilateral    "not cancer"  . CATARACT EXTRACTION W/ INTRAOCULAR LENS  IMPLANT, BILATERAL Bilateral   . ORIF FEMUR FRACTURE  03/01/2012   Procedure: OPEN REDUCTION INTERNAL FIXATION (ORIF) DISTAL FEMUR FRACTURE;  Surgeon: Rozanna Box, MD;  Location: [facility];  Service: Orthopedics;  Laterality: Right;  ORIF right femur    OB History    Gravida Para Term Preterm AB Living   0 0 0 0 0     SAB TAB Ectopic Multiple Live Births   0 0 0           Home Medications    Prior to Admission medications   Medication Sig Start Date End Date Taking? Authorizing Provider  acetaminophen (TYLENOL) 650 MG  CR tablet Take 650 mg by mouth every 4 (four) hours as needed for pain.    [provider]  Amino Acids-Protein Hydrolys (FEEDING SUPPLEMENT, PRO-STAT SUGAR FREE 64,) LIQD Take 30 mLs by mouth 2 (two) times daily.    [provider]  aspirin 325 MG tablet Take 325 mg by mouth daily with breakfast.     [provider]  atorvastatin (LIPITOR) 20 MG tablet Take 20 mg by mouth every evening.     [provider]  cephALEXin (KEFLEX) 500 MG capsule Take 1 capsule (500 mg total) by mouth 2 (two) times daily. 12/25/16   Ripley Fraise, MD  chlorhexidine (PERIDEX) 0.12 % solution Use as directed  15 mLs in the mouth or throat 2 (two) times daily. For mouth sores after brushing teeth. Swish for seconds. Do Not rinse mouth or brush teeth after use.    [provider]  cloNIDine (CATAPRES) 0.1 MG tablet Take 0.1 mg by mouth 2 (two) times daily.    [provider]  Dextrose, Diabetic Use, (INSTA-GLUCOSE) 77.4 % GEL Take 1 vial by mouth daily as needed (Diabetes, Give when CBG is <60).    [provider]  docusate sodium (COLACE) 100 MG capsule Take 1 capsule (100 mg total) by mouth every 12 (twelve) hours. Patient not taking: Reported on 12/24/2016 05/26/15   Orpah Greek, MD  hydrocerin (EUCERIN) CREA Apply 1 application topically 2 (two) times daily. Applied to bilateral legs twice a day    [provider]  hydrocerin (EUCERIN) CREA Apply 1 application topically 2 (two) times daily as needed (for rash on bilateral legs).    [provider]  hydrocortisone (ANUSOL-HC) 2.5 % rectal cream Place 1 application rectally every 12 (twelve) hours as needed for hemorrhoids or itching.    [provider]  hydrocortisone cream (HYDROCORTISONE MAX ST) 1 % Apply 1 application topically 3 (three) times daily as needed for itching.    [provider]  insulin aspart (NOVOLOG) 100 UNIT/ML injection Inject 0-15 Units into the skin 3 (three) times daily before meals. Per sliding scale   0-150 = 0 units 151-200 = 3 units 201-250 = 5 units 251-300 = 7 units 301-350 = 9 units 351-400 = 11 units 401-450 = 13 units 451-500 = 15 units and call provider    [provider]  insulin detemir (LEVEMIR) 100 UNIT/ML injection Inject 0.1 mLs (10 Units total) into the skin daily with lunch. Patient taking differently: Inject 12 Units at bedtime into the skin.  09/22/13   Waldon Merl, PA-C  lisinopril (PRINIVIL,ZESTRIL) 40 MG tablet Take 40 mg by mouth daily with breakfast.     [provider]  lisinopril (PRINIVIL,ZESTRIL) 5 MG  tablet Take 5 mg by mouth daily.    [provider]  loperamide (IMODIUM) 2 MG capsule Take 2-4 mg by mouth as needed for diarrhea or loose stools.    [provider]  LORazepam (ATIVAN) 0.5 MG tablet Take 1 tablet (0.5 mg total) by mouth every 6 (six) hours as needed for anxiety. 04/28/14   Larence Penning, MD  magnesium hydroxide (MILK OF MAGNESIA) 400 MG/5ML suspension Take 30 mLs by mouth daily as needed for mild constipation.    [provider]  metoprolol succinate (TOPROL-XL) 50 MG 24 hr tablet Take 75 mg by mouth daily with breakfast. Take with or immediately following a meal.     [provider]  polyethylene glycol (MIRALAX / GLYCOLAX) packet Take  17 g by mouth daily. Patient taking differently: Take 17 g by mouth daily with breakfast.  09/22/13   Lacy Duverney M, PA-C  ranitidine (ZANTAC) 150 MG tablet Take 150 mg by mouth every evening.     [provider]  vitamin C (ASCORBIC ACID) 500 MG tablet Take 500 mg by mouth 2 (two) times daily.    [provider]  zinc gluconate 50 MG tablet Take 50 mg by mouth daily with breakfast.    [provider]    Family History Family History  Problem Relation Age of Onset  . Diabetes Mother   . Diabetes Father   . Diabetes Sister        x3  . Diabetes Brother        x4  . Colon cancer Neg Hx   . Colon polyps Neg Hx     Social History Social History   Tobacco Use  . Smoking status: Former Smoker    Packs/day: 1.00    Years: 5.00    Pack years: 5.00    Last attempt to quit: 01/26/1993    Years since quitting: 24.1  . Smokeless tobacco: Former Systems developer  . Tobacco comment: "quit smoking at ~ age 13"  Substance Use Topics  . Alcohol use: No  . Drug use: No     Allergies   Patient has no known allergies.   Review of Systems Review of Systems  Unable to perform ROS: Dementia  Neurological: Positive for weakness.     Physical Exam Updated Vital Signs BP (!) 138/45 (BP  Location: Left Arm)   Pulse 92   Resp 20   Wt 75.3 kg (166 lb)   SpO2 99%   BMI 27.62 kg/m   Physical Exam  Constitutional: She appears well-developed and well-nourished. No distress.  HENT:  Head: Normocephalic and atraumatic.  Moist mucous membranes  Eyes: Conjunctivae are normal. Pupils are equal, round, and reactive to light.  Neck: Neck supple.  Cardiovascular: Normal rate and regular rhythm.  Murmur heard. Pulmonary/Chest: Effort normal and breath sounds normal.  Abdominal: Soft. Bowel sounds are normal. She exhibits no distension. There is no tenderness.  Neurological: She is alert.  Fluent speech Oriented to person, moving all 4 ext equally  Skin: Skin is warm and dry.  Nursing note and vitals reviewed.    ED Treatments / Results  Labs (all labs ordered are listed, but only abnormal results are displayed) Labs Reviewed  COMPREHENSIVE METABOLIC PANEL - Abnormal; Notable for the following components:      Result Value   CO2 21 (*)    Glucose, Bld 175 (*)    BUN 32 (*)    Creatinine, Ser 1.41 (*)    Albumin 3.1 (*)    ALT 8 (*)    GFR calc non Af Amer 35 (*)    GFR calc Af Amer 40 (*)    All other components within normal limits  CBC WITH DIFFERENTIAL/PLATELET - Abnormal; Notable for the following components:   Hemoglobin 10.8 (*)    HCT 35.0 (*)    All other components within normal limits  URINALYSIS, ROUTINE W REFLEX MICROSCOPIC - Abnormal; Notable for the following components:   APPearance HAZY (*)    Ketones, ur 5 (*)    All other components within normal limits  CBG MONITORING, ED - Abnormal; Notable for the following components:   Glucose-Capillary 149 (*)    All other components within normal limits    EKG  EKG Interpretation  Date/Time:  Thursday March 18 2017 16:32:04 EST Ventricular Rate:  99 PR Interval:    QRS Duration: 97 QT Interval:  429 QTC Calculation: 551 R Axis:   33 Text Interpretation:  Sinus or ectopic atrial rhythm  Prolonged QT interval No significant change since last tracing Confirmed by Theotis Burrow [phone]) on 03/18/2017 6:25:47 PM       Radiology No results found.  Procedures Procedures (including critical care time)  Medications Ordered in ED Medications - No data to display   Initial Impression / Assessment and Plan / ED Course  I have reviewed the triage vital signs and the nursing notes.  Pertinent labs & imaging results that were available during my care of the patient were reviewed by me and considered in my medical decision making (see chart for details).     Pt comfortable on exam w/ reassuring VS. EKG similar to previous.  Lab work is reassuring with creatinine similar to previous, normal WBC count, normal UA.  She has remained comfortable here with stable blood glucose.  It is unclear whether she became mildly hypoglycemic at nursing facility but has had no problems with hypoglycemia in the ED. Pt discharged back to nursing facility in satisfactory condition.  Final Clinical Impressions(s) / ED Diagnoses   Final diagnoses:  Weakness    ED Discharge Orders    None       Khiara Shuping, Wenda Overland, MD 03/19/17 [id number]

## 2017-03-18 NOTE — ED Notes (Signed)
Report given to [facility] staff , PTAR notified by Network engineer to transport .

## 2017-03-18 NOTE — ED Triage Notes (Signed)
Pt in from [facility] via [facility] with c/o generalized weakness. Per EMS, pt was LSN at 1315. Staff say pt was sitting in wheelchair during activities and suddenly slumped over in chair, LOC x 30 sec. CBG 90. Pt pale, diaphoretic on EMS arrival. Was given OJ and oral glucose tabs PTA. CBG recheck 170. BP now 119/62, denies fevers, chills, neuro is intact, a&ox4

## 2017-03-22 ENCOUNTER — Ambulatory Visit (HOSPITAL_COMMUNITY): Admission: RE | Admit: 2017-03-22 | Payer: Medicare Other | Source: Ambulatory Visit

## 2017-03-23 DIAGNOSIS — E11621 Type 2 diabetes mellitus with foot ulcer: Secondary | ICD-10-CM | POA: Diagnosis not present

## 2017-03-26 ENCOUNTER — Other Ambulatory Visit: Payer: Self-pay | Admitting: Internal Medicine

## 2017-03-26 DIAGNOSIS — E1141 Type 2 diabetes mellitus with diabetic mononeuropathy: Secondary | ICD-10-CM

## 2017-03-26 DIAGNOSIS — E11621 Type 2 diabetes mellitus with foot ulcer: Secondary | ICD-10-CM

## 2017-03-26 DIAGNOSIS — L97422 Non-pressure chronic ulcer of left heel and midfoot with fat layer exposed: Secondary | ICD-10-CM

## 2017-03-26 DIAGNOSIS — L97522 Non-pressure chronic ulcer of other part of left foot with fat layer exposed: Secondary | ICD-10-CM

## 2017-03-26 DIAGNOSIS — L97529 Non-pressure chronic ulcer of other part of left foot with unspecified severity: Principal | ICD-10-CM

## 2017-03-30 ENCOUNTER — Other Ambulatory Visit (HOSPITAL_COMMUNITY)
Admission: RE | Admit: 2017-03-30 | Discharge: 2017-03-30 | Disposition: A | Discharge status: Home / Self Care | Payer: Medicare Other | Source: Other Acute Inpatient Hospital | Attending: Internal Medicine | Admitting: Internal Medicine

## 2017-03-30 ENCOUNTER — Other Ambulatory Visit: Payer: Self-pay | Admitting: Internal Medicine

## 2017-03-30 ENCOUNTER — Encounter (HOSPITAL_BASED_OUTPATIENT_CLINIC_OR_DEPARTMENT_OTHER): Payer: Medicare Other | Attending: Internal Medicine

## 2017-03-30 DIAGNOSIS — E1141 Type 2 diabetes mellitus with diabetic mononeuropathy: Secondary | ICD-10-CM | POA: Diagnosis not present

## 2017-03-30 DIAGNOSIS — I10 Essential (primary) hypertension: Secondary | ICD-10-CM | POA: Diagnosis not present

## 2017-03-30 DIAGNOSIS — L97526 Non-pressure chronic ulcer of other part of left foot with bone involvement without evidence of necrosis: Secondary | ICD-10-CM | POA: Diagnosis not present

## 2017-03-30 DIAGNOSIS — E11621 Type 2 diabetes mellitus with foot ulcer: Secondary | ICD-10-CM | POA: Diagnosis not present

## 2017-04-01 ENCOUNTER — Ambulatory Visit (HOSPITAL_COMMUNITY)
Admission: RE | Admit: 2017-04-01 | Discharge: 2017-04-01 | Disposition: A | Discharge status: Home / Self Care | Payer: Medicare Other | Source: Ambulatory Visit | Attending: Cardiovascular Disease | Admitting: Cardiovascular Disease

## 2017-04-01 DIAGNOSIS — L97521 Non-pressure chronic ulcer of other part of left foot limited to breakdown of skin: Secondary | ICD-10-CM | POA: Insufficient documentation

## 2017-04-01 DIAGNOSIS — Z89511 Acquired absence of right leg below knee: Secondary | ICD-10-CM | POA: Diagnosis not present

## 2017-04-06 ENCOUNTER — Ambulatory Visit (HOSPITAL_COMMUNITY)
Admission: RE | Admit: 2017-04-06 | Discharge: 2017-04-06 | Disposition: A | Discharge status: Home / Self Care | Payer: Medicare Other | Source: Ambulatory Visit | Attending: Internal Medicine | Admitting: Internal Medicine

## 2017-04-06 ENCOUNTER — Encounter (HOSPITAL_COMMUNITY): Payer: Self-pay

## 2017-04-06 DIAGNOSIS — E11621 Type 2 diabetes mellitus with foot ulcer: Secondary | ICD-10-CM | POA: Diagnosis not present

## 2017-04-06 LAB — AEROBIC/ANAEROBIC CULTURE W GRAM STAIN (SURGICAL/DEEP WOUND)

## 2017-04-06 LAB — AEROBIC/ANAEROBIC CULTURE (SURGICAL/DEEP WOUND)

## 2017-04-09 ENCOUNTER — Ambulatory Visit (HOSPITAL_COMMUNITY)
Admission: RE | Admit: 2017-04-09 | Discharge: 2017-04-09 | Disposition: A | Discharge status: Home / Self Care | Payer: Medicare Other | Source: Ambulatory Visit | Attending: Internal Medicine | Admitting: Internal Medicine

## 2017-04-09 DIAGNOSIS — E11621 Type 2 diabetes mellitus with foot ulcer: Secondary | ICD-10-CM | POA: Diagnosis present

## 2017-04-09 DIAGNOSIS — L97522 Non-pressure chronic ulcer of other part of left foot with fat layer exposed: Secondary | ICD-10-CM

## 2017-04-09 DIAGNOSIS — L98499 Non-pressure chronic ulcer of skin of other sites with unspecified severity: Secondary | ICD-10-CM | POA: Insufficient documentation

## 2017-04-09 DIAGNOSIS — E1141 Type 2 diabetes mellitus with diabetic mononeuropathy: Secondary | ICD-10-CM

## 2017-04-09 DIAGNOSIS — L97422 Non-pressure chronic ulcer of left heel and midfoot with fat layer exposed: Secondary | ICD-10-CM

## 2017-04-09 MED ORDER — GADOBENATE DIMEGLUMINE 529 MG/ML IV SOLN
15.0000 mL | Freq: Once | INTRAVENOUS | Status: AC | PRN
  Administered 2017-04-09: 7 mL via INTRAVENOUS

## 2017-04-13 DIAGNOSIS — E11621 Type 2 diabetes mellitus with foot ulcer: Secondary | ICD-10-CM | POA: Diagnosis not present

## 2017-04-13 LAB — GLUCOSE, CAPILLARY: Glucose-Capillary: 156 mg/dL — ABNORMAL HIGH (ref 65–99)

## 2017-04-20 ENCOUNTER — Encounter (HOSPITAL_COMMUNITY): Payer: Self-pay

## 2017-04-21 ENCOUNTER — Emergency Department (HOSPITAL_COMMUNITY)
Admission: EM | Admit: 2017-04-21 | Discharge: 2017-04-21 | Disposition: A | Discharge status: Home / Self Care | Payer: Medicare Other | Source: Home / Self Care | Attending: Emergency Medicine | Admitting: Emergency Medicine

## 2017-04-21 ENCOUNTER — Encounter (HOSPITAL_COMMUNITY): Payer: Self-pay | Admitting: Emergency Medicine

## 2017-04-21 ENCOUNTER — Telehealth (INDEPENDENT_AMBULATORY_CARE_PROVIDER_SITE_OTHER): Payer: Self-pay | Admitting: Orthopedic Surgery

## 2017-04-21 DIAGNOSIS — I10 Essential (primary) hypertension: Secondary | ICD-10-CM | POA: Insufficient documentation

## 2017-04-21 DIAGNOSIS — Z87891 Personal history of nicotine dependence: Secondary | ICD-10-CM | POA: Insufficient documentation

## 2017-04-21 DIAGNOSIS — E119 Type 2 diabetes mellitus without complications: Secondary | ICD-10-CM | POA: Insufficient documentation

## 2017-04-21 DIAGNOSIS — J45909 Unspecified asthma, uncomplicated: Secondary | ICD-10-CM | POA: Insufficient documentation

## 2017-04-21 DIAGNOSIS — F0390 Unspecified dementia without behavioral disturbance: Secondary | ICD-10-CM

## 2017-04-21 DIAGNOSIS — Z79899 Other long term (current) drug therapy: Secondary | ICD-10-CM

## 2017-04-21 DIAGNOSIS — N179 Acute kidney failure, unspecified: Secondary | ICD-10-CM | POA: Diagnosis not present

## 2017-04-21 DIAGNOSIS — N390 Urinary tract infection, site not specified: Secondary | ICD-10-CM | POA: Insufficient documentation

## 2017-04-21 DIAGNOSIS — Z794 Long term (current) use of insulin: Secondary | ICD-10-CM

## 2017-04-21 DIAGNOSIS — R739 Hyperglycemia, unspecified: Secondary | ICD-10-CM | POA: Diagnosis not present

## 2017-04-21 LAB — URINALYSIS, ROUTINE W REFLEX MICROSCOPIC
Bilirubin Urine: NEGATIVE
Ketones, ur: NEGATIVE mg/dL
Nitrite: NEGATIVE
PROTEIN: NEGATIVE mg/dL
SPECIFIC GRAVITY, URINE: 1.011 (ref 1.005–1.030)
pH: 5 (ref 5.0–8.0)

## 2017-04-21 LAB — CBC WITH DIFFERENTIAL/PLATELET
BASOS ABS: 0 10*3/uL (ref 0.0–0.1)
BASOS PCT: 0 %
EOS ABS: 0 10*3/uL (ref 0.0–0.7)
EOS PCT: 0 %
HCT: 36.7 % (ref 36.0–46.0)
Hemoglobin: 12.2 g/dL (ref 12.0–15.0)
Lymphocytes Relative: 7 %
Lymphs Abs: 0.9 10*3/uL (ref 0.7–4.0)
MCH: 27.2 pg (ref 26.0–34.0)
MCHC: 33.2 g/dL (ref 30.0–36.0)
MCV: 81.7 fL (ref 78.0–100.0)
MONO ABS: 0.3 10*3/uL (ref 0.1–1.0)
Monocytes Relative: 2 %
Neutro Abs: 11.6 10*3/uL — ABNORMAL HIGH (ref 1.7–7.7)
Neutrophils Relative %: 91 %
PLATELETS: 343 10*3/uL (ref 150–400)
RBC: 4.49 MIL/uL (ref 3.87–5.11)
RDW: 16.4 % — AB (ref 11.5–15.5)
WBC: 12.8 10*3/uL — ABNORMAL HIGH (ref 4.0–10.5)

## 2017-04-21 LAB — BLOOD GAS, VENOUS
Acid-base deficit: 6.9 mmol/L — ABNORMAL HIGH (ref 0.0–2.0)
BICARBONATE: 16.2 mmol/L — AB (ref 20.0–28.0)
O2 SAT: 83.7 %
PATIENT TEMPERATURE: 98.6
PO2 VEN: 49.4 mmHg — AB (ref 32.0–45.0)
pCO2, Ven: 26.5 mmHg — ABNORMAL LOW (ref 44.0–60.0)
pH, Ven: 7.404 (ref 7.250–7.430)

## 2017-04-21 LAB — BASIC METABOLIC PANEL
ANION GAP: 13 (ref 5–15)
Anion gap: 15 (ref 5–15)
BUN: 55 mg/dL — AB (ref 6–20)
BUN: 46 mg/dL — ABNORMAL HIGH (ref 6–20)
CALCIUM: 9.6 mg/dL (ref 8.9–10.3)
CALCIUM: 9.3 mg/dL (ref 8.9–10.3)
CO2: 17 mmol/L — ABNORMAL LOW (ref 22–32)
CO2: 15 mmol/L — AB (ref 22–32)
CREATININE: 1.62 mg/dL — AB (ref 0.44–1.00)
Chloride: 111 mmol/L (ref 101–111)
Chloride: 114 mmol/L — ABNORMAL HIGH (ref 101–111)
Creatinine, Ser: 1.42 mg/dL — ABNORMAL HIGH (ref 0.44–1.00)
GFR calc Af Amer: 34 mL/min — ABNORMAL LOW (ref >60)
GFR calc Af Amer: 40 mL/min — ABNORMAL LOW (ref >60)
GFR calc non Af Amer: 35 mL/min — ABNORMAL LOW (ref >60)
GFR, EST NON AFRICAN AMERICAN: 30 mL/min — AB (ref >60)
Glucose, Bld: 373 mg/dL — ABNORMAL HIGH (ref 65–99)
Glucose, Bld: 322 mg/dL — ABNORMAL HIGH (ref 65–99)
POTASSIUM: 4.5 mmol/L (ref 3.5–5.1)
Potassium: 4.7 mmol/L (ref 3.5–5.1)
SODIUM: 143 mmol/L (ref 135–145)
Sodium: 142 mmol/L (ref 135–145)

## 2017-04-21 LAB — CBG MONITORING, ED
GLUCOSE-CAPILLARY: 362 mg/dL — AB (ref 65–99)
GLUCOSE-CAPILLARY: 310 mg/dL — AB (ref 65–99)

## 2017-04-21 MED ORDER — LACTATED RINGERS IV BOLUS
1000.0000 mL | Freq: Once | INTRAVENOUS | Status: AC
  Administered 2017-04-21: 1000 mL via INTRAVENOUS

## 2017-04-21 MED ORDER — SODIUM CHLORIDE 0.9 % IV BOLUS
1000.0000 mL | Freq: Once | INTRAVENOUS | Status: AC
  Administered 2017-04-21: 1000 mL via INTRAVENOUS

## 2017-04-21 MED ORDER — CEPHALEXIN 500 MG PO CAPS
500.0000 mg | ORAL_CAPSULE | Freq: Once | ORAL | Status: AC
  Administered 2017-04-21: 500 mg via ORAL
  Filled 2017-04-21: qty 1

## 2017-04-21 MED ORDER — CEPHALEXIN 500 MG PO CAPS: 500.0000 mg | ORAL_CAPSULE | Freq: Two times a day (BID) | ORAL | 0 refills | Status: DC

## 2017-04-21 NOTE — ED Notes (Signed)
Pt left with PTAR, unable to sign d/t baseline mental status.

## 2017-04-21 NOTE — ED Triage Notes (Signed)
Per [facility] pt coming from [facility] home due to hyperglycemia. Staff reports been giving insulin to patient all day however blood sugar contining to stay elevated. Patient does have a known infection in foot and is supposed to have amputated. Hx of dementia.100CC NS given en route by EMS. CBG 483

## 2017-04-21 NOTE — Discharge Instructions (Signed)
We will contact you with the results of your urine culture when it is available. Take Keflex as directed. Continue your home medications as previously prescribed. Follow-up with your primary care provider for further evaluation.

## 2017-04-21 NOTE — Telephone Encounter (Signed)
Patient called asking for a refill on his Tramadol and also wanted to know if his MRI results have been received yet? CB #

## 2017-04-21 NOTE — ED Provider Notes (Signed)
[facility] DEPT Provider Note   CSN: [id number] Arrival date & time: 04/21/17  1449     History   Chief Complaint Chief Complaint  Patient presents with  . Hyperglycemia    HPI Judith [name] is a 77 y.o. female with a past medical history of diabetes, hypertension, who presents to ED for evaluation of hyperglycemia.  Per SNF, patient's blood sugars have been running "high" despite the use of insulin based on her sliding scale regimen.  Patient has a known infection in her left foot and is supposed to be evaluated by a doctor in 2 days.  She does have a history of right foot amputation several years ago due to a previous infection.  Remainder of history is limited due to patient's dementia. Per EMS, patient CBG was 483 and was given 100cc bolus en route.  HPI  Past Medical History:  Diagnosis Date  . Anxiety   . Arthritis    "left leg" (05/17/2013)  . Asthma   . 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 Closed right ankle fracture   . Colitis 09/06/02  . Diabetes mellitus without complication ([place])   . Fracture of femur, distal, right, closed ([place]) 03/01/2012  . GERD (gastroesophageal reflux disease)   . CHENIDPO(242.3)    "probably weekly" (05/17/2013)  . Hemorrhoid   . High cholesterol   . History of stomach ulcers   . Hyperlipidemia   . Hypertension   . Insulin dependent diabetes mellitus ([place]) 03/01/2012  . Obesity (BMI 30-39.9) 03/03/2012  . Pneumonia    "once" (05/17/2013)  . Proctocolitis 09/04/02    Patient Active Problem List   Diagnosis Date Noted  . Uterine hypertrophy 06/11/2015  . Kidney lesion, native, bilateral 06/11/2015  . Protein-calorie malnutrition, severe ([place]) 09/22/2013  . Dementia 09/21/2013  . Constipation 09/21/2013  . Rectal bleeding 09/21/2013  . Fecal impaction ([place]) 09/21/2013  . BRBPR (bright red blood per rectum) 09/21/2013  . Altered mental status 05/17/2013  . Altered mental state 05/17/2013  .  Delirium due to another medical condition 02/18/2013  . Acute blood loss anemia 03/03/2012  . 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 Fall 03/03/2012  . Obesity (BMI 30-39.9) 03/03/2012  . Poor nutrition 03/03/2012  . Fracture of femur, distal, right, closed ([place]) 03/01/2012  . UTI (lower urinary tract infection) 03/01/2012  . Insulin dependent diabetes mellitus ([place]) 03/01/2012  . Hypertension     Past Surgical History:  Procedure Laterality Date  . ABDOMINAL HYSTERECTOMY    . BELOW KNEE LEG AMPUTATION Right 02/2008  . BREAST LUMPECTOMY Bilateral    "not cancer"  . CATARACT EXTRACTION W/ INTRAOCULAR LENS  IMPLANT, BILATERAL Bilateral   . ORIF FEMUR FRACTURE  03/01/2012   Procedure: OPEN REDUCTION INTERNAL FIXATION (ORIF) DISTAL FEMUR FRACTURE;  Surgeon: Rozanna Box, MD;  Location: [facility];  Service: Orthopedics;  Laterality: Right;  ORIF right femur     OB History    Gravida  0   Para  0   Term  0   Preterm  0   AB  0   Living        SAB  0   TAB  0   Ectopic  0   Multiple      Live Births               Home Medications    Prior to Admission medications   Medication Sig Start Date End  Date Taking? Authorizing Provider  acetaminophen (TYLENOL) 650 MG CR tablet Take 650 mg by mouth every 4 (four) hours as needed for pain.   Yes [provider]  Amino Acids-Protein Hydrolys (FEEDING SUPPLEMENT, PRO-STAT SUGAR FREE 64,) LIQD Take 30 mLs by mouth 2 (two) times daily.   Yes [provider]  aspirin 325 MG tablet Take 325 mg by mouth daily with breakfast.    Yes [provider]  atorvastatin (LIPITOR) 20 MG tablet Take 20 mg by mouth every evening.    Yes [provider]  chlorhexidine (PERIDEX) 0.12 % solution Use as directed 15 mLs in the mouth or throat 2 (two) times daily. For mouth sores after brushing teeth. Swish for seconds. Do Not rinse mouth or brush teeth after use.   Yes [provider]  cloNIDine (CATAPRES)  0.1 MG tablet Take 0.1 mg by mouth 2 (two) times daily.   Yes [provider]  Dextrose, Diabetic Use, (INSTA-GLUCOSE) 77.4 % GEL Take 1 vial by mouth daily as needed (Diabetes, Give when CBG is <60).   Yes [provider]  hydrocerin (EUCERIN) CREA Apply 1 application topically 2 (two) times daily. Applied to bilateral legs twice a day   Yes [provider]  hydrocortisone (ANUSOL-HC) 2.5 % rectal cream Place 1 application rectally every 12 (twelve) hours as needed for hemorrhoids or itching.   Yes [provider]  hydrocortisone cream (HYDROCORTISONE MAX ST) 1 % Apply 1 application topically 3 (three) times daily as needed for itching.   Yes [provider]  insulin aspart (NOVOLOG) 100 UNIT/ML injection Inject 0-15 Units into the skin 3 (three) times daily before meals. Per sliding scale   0-150 = 0 units 151-200 = 3 units 201-250 = 5 units 251-300 = 7 units 301-350 = 9 units 351-400 = 11 units 401-450 = 13 units 451-500 = 15 units and call provider   Yes [provider]  insulin detemir (LEVEMIR) 100 UNIT/ML injection Inject 0.1 mLs (10 Units total) into the skin daily with lunch. Patient taking differently: Inject 5 Units into the skin at bedtime.  09/22/13  Yes [name], [name], PA-C  lisinopril (PRINIVIL,ZESTRIL) 5 MG tablet Take 5 mg by mouth daily.   Yes [provider]  loperamide (IMODIUM) 2 MG capsule Take 2-4 mg by mouth as needed for diarrhea or loose stools.   Yes [provider]  LORazepam (ATIVAN) 0.5 MG tablet Take 1 tablet (0.5 mg total) by mouth every 6 (six) hours as needed for anxiety. 04/28/14  Yes Larence Penning, MD  magnesium hydroxide (MILK OF MAGNESIA) 400 MG/5ML suspension Take 30 mLs by mouth daily as needed for mild constipation.   Yes [provider]  megestrol (MEGACE) 400 MG/10ML suspension Take 800 mg by mouth daily.   Yes [provider]  metoprolol succinate (TOPROL-XL) 50 MG 24  hr tablet Take 75 mg by mouth daily with breakfast. Take with or immediately following a meal.    Yes [provider]  mirtazapine (REMERON) 7.5 MG tablet Take 7.5 mg by mouth every other day.   Yes [provider]  polyethylene glycol (MIRALAX / GLYCOLAX) packet Take 17 g by mouth daily. Patient taking differently: Take 17 g by mouth daily with breakfast.  09/22/13  Yes [name], [name], PA-C  ranitidine (ZANTAC) 150 MG tablet Take 150 mg by mouth every evening.    Yes [provider]  vitamin C (ASCORBIC ACID) 500 MG tablet Take 500 mg  by mouth 2 (two) times daily.   Yes [provider]  zinc gluconate 50 MG tablet Take 50 mg by mouth daily with breakfast.   Yes [provider]  cephALEXin (KEFLEX) 500 MG capsule Take 1 capsule (500 mg total) by mouth 2 (two) times daily. 04/21/17   Deserie Dirks, PA-C  docusate sodium (COLACE) 100 MG capsule Take 1 capsule (100 mg total) by mouth every 12 (twelve) hours. Patient not taking: Reported on 12/24/2016 05/26/15   Orpah Greek, MD    Family History Family History  Problem Relation Age of Onset  . Diabetes Mother   . Diabetes Father   . Diabetes Sister        x3  . Diabetes Brother        x4  . Colon cancer Neg Hx   . Colon polyps Neg Hx     Social History Social History   Tobacco Use  . Smoking status: Former Smoker    Packs/day: 1.00    Years: 5.00    Pack years: 5.00    Last attempt to quit: 01/26/1993    Years since quitting: 24.2  . Smokeless tobacco: Former Systems developer  . Tobacco comment: "quit smoking at ~ age 33"  Substance Use Topics  . Alcohol use: No  . Drug use: No     Allergies   Patient has no known allergies.   Review of Systems Review of Systems  Unable to perform ROS: Dementia     Physical Exam Updated Vital Signs BP (!) 176/84   Pulse 95   Temp 98.3 F (36.8 C) (Oral)   Resp (!) 28   SpO2 100%   Physical Exam  Constitutional: She appears well-developed  and well-nourished. No distress.  HENT:  Head: Normocephalic and atraumatic.  Nose: Nose normal.  Eyes: Conjunctivae and EOM are normal. Left eye exhibits no discharge. No scleral icterus.  Dry mucous membranes.  Neck: Normal range of motion. Neck supple.  Cardiovascular: Normal rate, regular rhythm, normal heart sounds and intact distal pulses. Exam reveals no gallop and no friction rub.  No murmur heard. Pulmonary/Chest: Effort normal and breath sounds normal. No respiratory distress.  Abdominal: Soft. Bowel sounds are normal. She exhibits no distension. There is no tenderness. There is no guarding.  Musculoskeletal: Normal range of motion. She exhibits no edema.  Right sided foot amputation.  Neurological: She is alert. She exhibits normal muscle tone. Coordination normal.  Alert and oriented to self, place.  Unable to tell me her birthday, year, month or current president. Pupils reactive. No facial asymmetry noted. Cranial nerves appear grossly intact.   Skin: Skin is warm and dry. No rash noted. She is not diaphoretic.  Psychiatric: She has a normal mood and affect.  Nursing note and vitals reviewed.      ED Treatments / Results  Labs (all labs ordered are listed, but only abnormal results are displayed) Labs Reviewed  BASIC METABOLIC PANEL - Abnormal; Notable for the following components:      Result Value   CO2 17 (*)    Glucose, Bld 373 (*)    BUN 55 (*)    Creatinine, Ser 1.62 (*)    GFR calc non Af Amer 30 (*)    GFR calc Af Amer 34 (*)    All other components within normal limits  CBC WITH DIFFERENTIAL/PLATELET - Abnormal; Notable for the following components:   WBC 12.8 (*)    RDW 16.4 (*)    Neutro  Abs 11.6 (*)    All other components within normal limits  URINALYSIS, ROUTINE W REFLEX MICROSCOPIC - Abnormal; Notable for the following components:   APPearance CLOUDY (*)    Glucose, UA >=500 (*)    Hgb urine dipstick SMALL (*)    Leukocytes, UA LARGE (*)     Bacteria, UA MANY (*)    Squamous Epithelial / LPF 0-5 (*)    Non Squamous Epithelial 0-5 (*)    All other components within normal limits  BASIC METABOLIC PANEL - Abnormal; Notable for the following components:   Chloride 114 (*)    CO2 15 (*)    Glucose, Bld 322 (*)    BUN 46 (*)    Creatinine, Ser 1.42 (*)    GFR calc non Af Amer 35 (*)    GFR calc Af Amer 40 (*)    All other components within normal limits  BLOOD GAS, VENOUS - Abnormal; Notable for the following components:   pCO2, Ven 26.5 (*)    pO2, Ven 49.4 (*)    Bicarbonate 16.2 (*)    Acid-base deficit 6.9 (*)    All other components within normal limits  CBG MONITORING, ED - Abnormal; Notable for the following components:   Glucose-Capillary 362 (*)    All other components within normal limits  CBG MONITORING, ED - Abnormal; Notable for the following components:   Glucose-Capillary 310 (*)    All other components within normal limits  URINE CULTURE    EKG None  Radiology No results found.  Procedures Procedures (including critical care time)  Medications Ordered in ED Medications  sodium chloride 0.9 % bolus 1,000 mL (0 mLs Intravenous Stopped 04/21/17 1730)  cephALEXin (KEFLEX) capsule 500 mg (500 mg Oral Given 04/21/17 1933)  lactated ringers bolus 1,000 mL (1,000 mLs Intravenous New Bag/Given 04/21/17 2007)     Initial Impression / Assessment and Plan / ED Course  I have reviewed the triage vital signs and the nursing notes.  Pertinent labs & imaging results that were available during my care of the patient were reviewed by me and considered in my medical decision making (see chart for details).  Clinical Course as of Apr 21 2236  Wed Apr 21, 6441  [id number] 77 year old female with history of diabetes sent in from her facility for increased blood sugars.  Patient herself denies any complaints.  She is got a ulcer on her left foot has an ongoing work-up for osteoporosis patient appears nontoxic.  She does  have a low bicarb and slightly elevated BUN creatinine and glucose here.  We will give her some fluids and will recheck her labs.  She will possibly be returned to her facility.   [MB]    Clinical Course User Index [MB] Hayden Rasmussen, MD    Patient presents to ED for evaluation of hyperglycemia.  Per MAR from the nursing facility that she resides at, patient's blood sugars have been in the mid 200s.  Her family member states that this is normal for her.  They have been giving her her insulin according to her sliding scale regimen.  Patient has a history of dementia so the history is limited.  She denies any pain in his overall well-appearing.  CBG elevated to 360 on arrival.  Initial BMP showed bicarb of 17, increase in BUN/creatinine from baseline.  Patient was given fluids with improvement in her BUN and creatinine but worsening of her bicarb to 15.  VBG showed normal pH at  7.4.  She was given a liter of normal saline and lactated Ringer's.  Urinalysis did show evidence of UTI.  This was sent for culture.  Patient is overall well-appearing and is able to tolerate p.o. intake without difficulty here. CBG improving.  She has a chronic wound on her left foot which will be evaluated by her PCP in 2 days.  I spoke to her family member on the phone who is agreeable to following up with her primary care provider and to put her on antibiotics for the UTI. Strict return precautions given. Patient discussed with and seen by my attending Dr. Melina Copa.  Portions of this note were generated with [employer]. Dictation errors may occur despite best attempts at proofreading.   Final Clinical Impressions(s) / ED Diagnoses   Final diagnoses:  Lower urinary tract infectious disease    ED Discharge Orders        Ordered    cephALEXin (KEFLEX) 500 MG capsule  2 times daily     04/21/17 2230       Delia Heady, PA-C 04/21/17 2240    Hayden Rasmussen, MD 04/22/17 1218

## 2017-04-21 NOTE — Telephone Encounter (Signed)
DISREGARD last message

## 2017-04-21 NOTE — ED Notes (Signed)
Attempted blood draw, was unsuccessful  

## 2017-04-21 NOTE — ED Notes (Signed)
Bed: WA06 Expected date:  Expected time:  Means of arrival:  Comments: EMS 

## 2017-04-21 NOTE — ED Notes (Signed)
Pt's CBG=362

## 2017-04-22 ENCOUNTER — Encounter (HOSPITAL_COMMUNITY): Payer: Self-pay

## 2017-04-23 ENCOUNTER — Emergency Department (HOSPITAL_COMMUNITY): Payer: Medicare Other

## 2017-04-23 ENCOUNTER — Encounter (HOSPITAL_COMMUNITY): Payer: Self-pay | Admitting: Emergency Medicine

## 2017-04-23 ENCOUNTER — Inpatient Hospital Stay (HOSPITAL_COMMUNITY)
Admission: EM | Admit: 2017-04-23 | Discharge: 2017-04-28 | DRG: 683 | Disposition: A | Discharge status: Skilled Nursing Facility | Payer: Medicare Other | Attending: Internal Medicine | Admitting: Internal Medicine

## 2017-04-23 DIAGNOSIS — R4182 Altered mental status, unspecified: Secondary | ICD-10-CM | POA: Diagnosis not present

## 2017-04-23 DIAGNOSIS — E785 Hyperlipidemia, unspecified: Secondary | ICD-10-CM | POA: Diagnosis not present

## 2017-04-23 DIAGNOSIS — E1122 Type 2 diabetes mellitus with diabetic chronic kidney disease: Secondary | ICD-10-CM | POA: Diagnosis present

## 2017-04-23 DIAGNOSIS — Z961 Presence of intraocular lens: Secondary | ICD-10-CM | POA: Diagnosis not present

## 2017-04-23 DIAGNOSIS — F0390 Unspecified dementia without behavioral disturbance: Secondary | ICD-10-CM | POA: Diagnosis present

## 2017-04-23 DIAGNOSIS — E872 Acidosis: Secondary | ICD-10-CM | POA: Diagnosis not present

## 2017-04-23 DIAGNOSIS — Z7982 Long term (current) use of aspirin: Secondary | ICD-10-CM

## 2017-04-23 DIAGNOSIS — E1169 Type 2 diabetes mellitus with other specified complication: Secondary | ICD-10-CM

## 2017-04-23 DIAGNOSIS — Z9841 Cataract extraction status, right eye: Secondary | ICD-10-CM

## 2017-04-23 DIAGNOSIS — Z8711 Personal history of peptic ulcer disease: Secondary | ICD-10-CM | POA: Diagnosis not present

## 2017-04-23 DIAGNOSIS — M86672 Other chronic osteomyelitis, left ankle and foot: Secondary | ICD-10-CM | POA: Diagnosis not present

## 2017-04-23 DIAGNOSIS — Z87891 Personal history of nicotine dependence: Secondary | ICD-10-CM | POA: Diagnosis not present

## 2017-04-23 DIAGNOSIS — E11610 Type 2 diabetes mellitus with diabetic neuropathic arthropathy: Secondary | ICD-10-CM | POA: Diagnosis not present

## 2017-04-23 DIAGNOSIS — N183 Chronic kidney disease, stage 3 (moderate): Secondary | ICD-10-CM | POA: Diagnosis present

## 2017-04-23 DIAGNOSIS — Z794 Long term (current) use of insulin: Secondary | ICD-10-CM | POA: Diagnosis not present

## 2017-04-23 DIAGNOSIS — B961 Klebsiella pneumoniae [K. pneumoniae] as the cause of diseases classified elsewhere: Secondary | ICD-10-CM | POA: Diagnosis present

## 2017-04-23 DIAGNOSIS — K219 Gastro-esophageal reflux disease without esophagitis: Secondary | ICD-10-CM | POA: Diagnosis present

## 2017-04-23 DIAGNOSIS — N179 Acute kidney failure, unspecified: Principal | ICD-10-CM

## 2017-04-23 DIAGNOSIS — Z7189 Other specified counseling: Secondary | ICD-10-CM | POA: Diagnosis not present

## 2017-04-23 DIAGNOSIS — M1990 Unspecified osteoarthritis, unspecified site: Secondary | ICD-10-CM | POA: Diagnosis not present

## 2017-04-23 DIAGNOSIS — E119 Type 2 diabetes mellitus without complications: Secondary | ICD-10-CM | POA: Diagnosis not present

## 2017-04-23 DIAGNOSIS — Z9842 Cataract extraction status, left eye: Secondary | ICD-10-CM

## 2017-04-23 DIAGNOSIS — N390 Urinary tract infection, site not specified: Secondary | ICD-10-CM | POA: Diagnosis present

## 2017-04-23 DIAGNOSIS — Z89511 Acquired absence of right leg below knee: Secondary | ICD-10-CM

## 2017-04-23 DIAGNOSIS — E11628 Type 2 diabetes mellitus with other skin complications: Secondary | ICD-10-CM | POA: Diagnosis present

## 2017-04-23 DIAGNOSIS — I10 Essential (primary) hypertension: Secondary | ICD-10-CM | POA: Diagnosis not present

## 2017-04-23 DIAGNOSIS — I129 Hypertensive chronic kidney disease with stage 1 through stage 4 chronic kidney disease, or unspecified chronic kidney disease: Secondary | ICD-10-CM | POA: Diagnosis present

## 2017-04-23 DIAGNOSIS — Z515 Encounter for palliative care: Secondary | ICD-10-CM | POA: Diagnosis not present

## 2017-04-23 DIAGNOSIS — E7800 Pure hypercholesterolemia, unspecified: Secondary | ICD-10-CM | POA: Diagnosis not present

## 2017-04-23 DIAGNOSIS — Z90710 Acquired absence of both cervix and uterus: Secondary | ICD-10-CM

## 2017-04-23 DIAGNOSIS — E11621 Type 2 diabetes mellitus with foot ulcer: Secondary | ICD-10-CM | POA: Diagnosis present

## 2017-04-23 DIAGNOSIS — F0150 Vascular dementia without behavioral disturbance: Secondary | ICD-10-CM | POA: Diagnosis present

## 2017-04-23 DIAGNOSIS — R739 Hyperglycemia, unspecified: Secondary | ICD-10-CM

## 2017-04-23 DIAGNOSIS — E669 Obesity, unspecified: Secondary | ICD-10-CM | POA: Diagnosis present

## 2017-04-23 DIAGNOSIS — E870 Hyperosmolality and hypernatremia: Secondary | ICD-10-CM | POA: Diagnosis not present

## 2017-04-23 DIAGNOSIS — L97526 Non-pressure chronic ulcer of other part of left foot with bone involvement without evidence of necrosis: Secondary | ICD-10-CM | POA: Diagnosis not present

## 2017-04-23 DIAGNOSIS — M869 Osteomyelitis, unspecified: Secondary | ICD-10-CM

## 2017-04-23 DIAGNOSIS — Z833 Family history of diabetes mellitus: Secondary | ICD-10-CM

## 2017-04-23 DIAGNOSIS — R410 Disorientation, unspecified: Secondary | ICD-10-CM

## 2017-04-23 DIAGNOSIS — L97529 Non-pressure chronic ulcer of other part of left foot with unspecified severity: Secondary | ICD-10-CM | POA: Diagnosis present

## 2017-04-23 DIAGNOSIS — IMO0001 Reserved for inherently not codable concepts without codable children: Secondary | ICD-10-CM

## 2017-04-23 DIAGNOSIS — E878 Other disorders of electrolyte and fluid balance, not elsewhere classified: Secondary | ICD-10-CM | POA: Diagnosis present

## 2017-04-23 DIAGNOSIS — Z6828 Body mass index (BMI) 28.0-28.9, adult: Secondary | ICD-10-CM

## 2017-04-23 DIAGNOSIS — E11649 Type 2 diabetes mellitus with hypoglycemia without coma: Secondary | ICD-10-CM | POA: Diagnosis not present

## 2017-04-23 LAB — TSH: TSH: 0.543 u[IU]/mL (ref 0.350–4.500)

## 2017-04-23 LAB — URINALYSIS, ROUTINE W REFLEX MICROSCOPIC
BILIRUBIN URINE: NEGATIVE
GLUCOSE, UA: NEGATIVE mg/dL
Hgb urine dipstick: NEGATIVE
KETONES UR: NEGATIVE mg/dL
NITRITE: NEGATIVE
PH: 5 (ref 5.0–8.0)
Protein, ur: NEGATIVE mg/dL
Specific Gravity, Urine: 1.016 (ref 1.005–1.030)
Squamous Epithelial / LPF: NONE SEEN

## 2017-04-23 LAB — CBC WITH DIFFERENTIAL/PLATELET
BASOS PCT: 0 %
Basophils Absolute: 0 10*3/uL (ref 0.0–0.1)
Eosinophils Absolute: 0 10*3/uL (ref 0.0–0.7)
Eosinophils Relative: 0 %
HEMATOCRIT: 36.5 % (ref 36.0–46.0)
HEMOGLOBIN: 11.9 g/dL — AB (ref 12.0–15.0)
LYMPHS ABS: 1.2 10*3/uL (ref 0.7–4.0)
LYMPHS PCT: 10 %
MCH: 27.1 pg (ref 26.0–34.0)
MCHC: 32.6 g/dL (ref 30.0–36.0)
MCV: 83.1 fL (ref 78.0–100.0)
MONOS PCT: 6 %
Monocytes Absolute: 0.8 10*3/uL (ref 0.1–1.0)
NEUTROS ABS: 10.7 10*3/uL — AB (ref 1.7–7.7)
Neutrophils Relative %: 84 %
Platelets: 305 10*3/uL (ref 150–400)
RBC: 4.39 MIL/uL (ref 3.87–5.11)
RDW: 16.6 % — ABNORMAL HIGH (ref 11.5–15.5)
WBC: 12.7 10*3/uL — ABNORMAL HIGH (ref 4.0–10.5)

## 2017-04-23 LAB — BASIC METABOLIC PANEL
Anion gap: 15 (ref 5–15)
BUN: 49 mg/dL — ABNORMAL HIGH (ref 6–20)
CHLORIDE: 114 mmol/L — AB (ref 101–111)
CO2: 16 mmol/L — AB (ref 22–32)
Calcium: 9.5 mg/dL (ref 8.9–10.3)
Creatinine, Ser: 1.89 mg/dL — ABNORMAL HIGH (ref 0.44–1.00)
GFR calc non Af Amer: 25 mL/min — ABNORMAL LOW (ref >60)
GFR, EST AFRICAN AMERICAN: 28 mL/min — AB (ref >60)
GLUCOSE: 232 mg/dL — AB (ref 65–99)
Potassium: 4.4 mmol/L (ref 3.5–5.1)
Sodium: 145 mmol/L (ref 135–145)

## 2017-04-23 LAB — LACTIC ACID, PLASMA
Lactic Acid, Venous: 2.1 mmol/L (ref 0.5–1.9)
Lactic Acid, Venous: 1.2 mmol/L (ref 0.5–1.9)

## 2017-04-23 LAB — SEDIMENTATION RATE: Sed Rate: 54 mm/hr — ABNORMAL HIGH (ref 0–22)

## 2017-04-23 LAB — CBG MONITORING, ED
Glucose-Capillary: 227 mg/dL — ABNORMAL HIGH (ref 65–99)
Glucose-Capillary: 222 mg/dL — ABNORMAL HIGH (ref 65–99)

## 2017-04-23 MED ORDER — PRO-STAT SUGAR FREE PO LIQD
30.0000 mL | Freq: Two times a day (BID) | ORAL | Status: DC
  Administered 2017-04-24 – 2017-04-28 (×9): 30 mL via ORAL
  Filled 2017-04-23 (×10): qty 30

## 2017-04-23 MED ORDER — ACETAMINOPHEN 325 MG PO TABS
650.0000 mg | ORAL_TABLET | Freq: Three times a day (TID) | ORAL | Status: DC
  Administered 2017-04-24 – 2017-04-28 (×14): 650 mg via ORAL
  Filled 2017-04-23 (×15): qty 2

## 2017-04-23 MED ORDER — MAGNESIUM HYDROXIDE 400 MG/5ML PO SUSP: 30.0000 mL | Freq: Every day | ORAL | Status: DC | PRN

## 2017-04-23 MED ORDER — SODIUM CHLORIDE 0.9 % IV SOLN
2.0000 g | Freq: Once | INTRAVENOUS | Status: AC
  Administered 2017-04-23: 2 g via INTRAVENOUS
  Filled 2017-04-23: qty 2

## 2017-04-23 MED ORDER — ONDANSETRON HCL 4 MG/2ML IJ SOLN: 4.0000 mg | Freq: Four times a day (QID) | INTRAMUSCULAR | Status: DC | PRN

## 2017-04-23 MED ORDER — SODIUM CHLORIDE 0.9 % IV BOLUS
1000.0000 mL | Freq: Once | INTRAVENOUS | Status: AC
  Administered 2017-04-23: 1000 mL via INTRAVENOUS

## 2017-04-23 MED ORDER — INSULIN ASPART 100 UNIT/ML ~~LOC~~ SOLN
0.0000 [IU] | Freq: Three times a day (TID) | SUBCUTANEOUS | Status: DC
  Administered 2017-04-24: 3 [IU] via SUBCUTANEOUS
  Administered 2017-04-24: 5 [IU] via SUBCUTANEOUS
  Administered 2017-04-24: 8 [IU] via SUBCUTANEOUS
  Administered 2017-04-25: 11 [IU] via SUBCUTANEOUS
  Administered 2017-04-25 (×2): 5 [IU] via SUBCUTANEOUS
  Administered 2017-04-26: 2 [IU] via SUBCUTANEOUS
  Administered 2017-04-26: 5 [IU] via SUBCUTANEOUS
  Administered 2017-04-27 – 2017-04-28 (×3): 2 [IU] via SUBCUTANEOUS
  Administered 2017-04-28: 3 [IU] via SUBCUTANEOUS
  Filled 2017-04-23 (×2): qty 1

## 2017-04-23 MED ORDER — VANCOMYCIN HCL 10 G IV SOLR
1500.0000 mg | Freq: Once | INTRAVENOUS | Status: AC
  Administered 2017-04-23: 1500 mg via INTRAVENOUS
  Filled 2017-04-23: qty 1500

## 2017-04-23 MED ORDER — SODIUM CHLORIDE 0.9 % IV SOLN
INTRAVENOUS | Status: DC
  Administered 2017-04-23: 23:00:00 via INTRAVENOUS

## 2017-04-23 MED ORDER — ATORVASTATIN CALCIUM 20 MG PO TABS
20.0000 mg | ORAL_TABLET | Freq: Every evening | ORAL | Status: DC
  Administered 2017-04-24 – 2017-04-27 (×4): 20 mg via ORAL
  Filled 2017-04-23 (×6): qty 1

## 2017-04-23 MED ORDER — POLYETHYLENE GLYCOL 3350 17 G PO PACK
17.0000 g | PACK | Freq: Every day | ORAL | Status: DC
  Administered 2017-04-24 – 2017-04-28 (×4): 17 g via ORAL
  Filled 2017-04-23 (×5): qty 1

## 2017-04-23 MED ORDER — ASPIRIN 325 MG PO TABS
325.0000 mg | ORAL_TABLET | Freq: Every day | ORAL | Status: DC
  Administered 2017-04-24 – 2017-04-28 (×5): 325 mg via ORAL
  Filled 2017-04-23 (×5): qty 1

## 2017-04-23 MED ORDER — VANCOMYCIN HCL IN DEXTROSE 750-5 MG/150ML-% IV SOLN: 750.0000 mg | INTRAVENOUS | Status: DC

## 2017-04-23 MED ORDER — ENOXAPARIN SODIUM 40 MG/0.4ML ~~LOC~~ SOLN
40.0000 mg | Freq: Every day | SUBCUTANEOUS | Status: DC
  Administered 2017-04-24 – 2017-04-27 (×5): 40 mg via SUBCUTANEOUS
  Filled 2017-04-23 (×5): qty 0.4

## 2017-04-23 MED ORDER — INSULIN DETEMIR 100 UNIT/ML ~~LOC~~ SOLN
10.0000 [IU] | Freq: Every day | SUBCUTANEOUS | Status: DC
  Administered 2017-04-24 – 2017-04-26 (×3): 10 [IU] via SUBCUTANEOUS
  Filled 2017-04-23 (×3): qty 0.1

## 2017-04-23 MED ORDER — VITAMIN C 500 MG PO TABS
500.0000 mg | ORAL_TABLET | Freq: Two times a day (BID) | ORAL | Status: DC
  Administered 2017-04-24 – 2017-04-28 (×9): 500 mg via ORAL
  Filled 2017-04-23 (×11): qty 1

## 2017-04-23 MED ORDER — METOPROLOL SUCCINATE ER 50 MG PO TB24
75.0000 mg | ORAL_TABLET | Freq: Every day | ORAL | Status: DC
  Administered 2017-04-24 – 2017-04-28 (×5): 75 mg via ORAL
  Filled 2017-04-23 (×8): qty 1

## 2017-04-23 MED ORDER — CLONIDINE HCL 0.1 MG PO TABS
0.1000 mg | ORAL_TABLET | Freq: Two times a day (BID) | ORAL | Status: DC
  Administered 2017-04-23 – 2017-04-27 (×8): 0.1 mg via ORAL
  Filled 2017-04-23 (×9): qty 1

## 2017-04-23 MED ORDER — FAMOTIDINE 20 MG PO TABS
20.0000 mg | ORAL_TABLET | Freq: Every day | ORAL | Status: DC
  Administered 2017-04-23 – 2017-04-28 (×6): 20 mg via ORAL
  Filled 2017-04-23 (×6): qty 1

## 2017-04-23 MED ORDER — VANCOMYCIN HCL IN DEXTROSE 1-5 GM/200ML-% IV SOLN: 1000.0000 mg | INTRAVENOUS | Status: DC

## 2017-04-23 MED ORDER — DOCUSATE SODIUM 100 MG PO CAPS
100.0000 mg | ORAL_CAPSULE | Freq: Two times a day (BID) | ORAL | Status: DC
  Administered 2017-04-23 – 2017-04-28 (×10): 100 mg via ORAL
  Filled 2017-04-23 (×10): qty 1

## 2017-04-23 MED ORDER — SODIUM CHLORIDE 0.9 % IV SOLN
1.0000 g | INTRAVENOUS | Status: DC
  Filled 2017-04-23: qty 1

## 2017-04-23 MED ORDER — INSULIN ASPART 100 UNIT/ML ~~LOC~~ SOLN
0.0000 [IU] | Freq: Every day | SUBCUTANEOUS | Status: DC
  Administered 2017-04-23: 2 [IU] via SUBCUTANEOUS
  Filled 2017-04-23: qty 1

## 2017-04-23 MED ORDER — LORAZEPAM 0.5 MG PO TABS
0.5000 mg | ORAL_TABLET | Freq: Four times a day (QID) | ORAL | Status: DC | PRN
  Administered 2017-04-24 – 2017-04-26 (×5): 0.5 mg via ORAL
  Filled 2017-04-23 (×5): qty 1

## 2017-04-23 MED ORDER — ONDANSETRON HCL 4 MG PO TABS: 4.0000 mg | ORAL_TABLET | Freq: Four times a day (QID) | ORAL | Status: DC | PRN

## 2017-04-23 MED ORDER — PROMETHAZINE HCL 25 MG PO TABS: 25.0000 mg | ORAL_TABLET | Freq: Four times a day (QID) | ORAL | Status: DC | PRN

## 2017-04-23 NOTE — Progress Notes (Signed)
Pharmacy Antibiotic Note  Judith [name] is a 77 y.o. female admitted on 04/23/2017 with osteomyelitis.  Pharmacy has been consulted for vancomycin and cefepime dosing.  On Keflex PTA for UTI.   3/29 CXR neg, 3/29 head CT neg. 3/15 MRI left foot:osteomyelitis throughout the proximal phalanx of the fifth toe and almost entire fifth metatarsal WBC 12.7, Cr 1.89, AF.  Wt from 03/18/17 = 75.2 kg and hg 65 inches. Ucx from 3/27 > 100 K Klebsiella pneumo.   Plan: Cefepime 2 gm IV x 1 then cefepime 1 gm IV q24 Vancomycin 1500 mg IV x 1 dose then vancomycin 1000 mg IV q48 for est AUC 519.7 using SCr 1.89, IBW/ABW. F/u renal function, WBC, temp, culture data Vancomycin levels as needed    Temp (24hrs), Avg:98.3 F (36.8 C), Min:98.1 F (36.7 C), Max:98.4 F (36.9 C)  Recent Labs  Lab 04/21/17 1520 04/21/17 1856 04/23/17 1338  WBC 12.8*  --  12.7*  CREATININE 1.62* 1.42* 1.89*    CrCl cannot be calculated (Unknown ideal weight.).    No Known Allergies  Antimicrobials this admission: 3/29 vanc>> 3/29 cefepime>> Dose adjustments this admission:  Microbiology results: 3/27 Ucx>> > 100 K Klebsiella 3/5 L foot- mod corynebacterium sp. F 3/29 Ucx>>  Thank you for allowing pharmacy to be a part of this patient's care.  Eudelia Bunch, Pharm.D. [phone] 04/23/2017 6:12 PM

## 2017-04-23 NOTE — ED Triage Notes (Signed)
Per [facility] pt coming from [facility] for same symptoms that was seen for on Wednesday. Patient was diagnosed with UTI and sent back on antibiotics. Staff reports pt just doesn't seem as 'spunky" as normal. Pt only alert to self.

## 2017-04-23 NOTE — ED Provider Notes (Signed)
Medical screening examination/treatment/procedure(s) were performed by non-physician practitioner and as supervising physician I was immediately available for consultation/collaboration.   EKG Interpretation  Date/Time:  Friday April 23 2017 13:30:40 EDT Ventricular Rate:  88 PR Interval:    QRS Duration: 96 QT Interval:  404 QTC Calculation: 489 R Axis:   27 Text Interpretation:  Sinus rhythm Multiple ventricular premature complexes Short PR interval Nonspecific T abnormalities, lateral leads Borderline ST elevation, lateral leads Borderline prolonged QT interval Artifact in lead(s) I II III aVR aVL aVF V1 Confirmed by Lacretia Leigh ([id number]) on 04/23/2017 1:44:77 PM     77 year old female sent from nursing home complaining of increased fatigue and weakness.  Patient does have a baseline history of dementia and was seen here recently for similar symptoms.  Labs here show worsening kidney injury.  Will IV hydrate and admit to the hospitalist service.   Lacretia Leigh, MD 04/23/17 1515

## 2017-04-23 NOTE — ED Notes (Signed)
Patient transported to X-ray 

## 2017-04-23 NOTE — ED Notes (Signed)
Bed: UU82 Expected date:  Expected time:  Means of arrival:  Comments: Hold for room 8

## 2017-04-23 NOTE — ED Provider Notes (Signed)
[facility] DEPT Provider Note   CSN: [id number] Arrival date & time: 04/23/17  1229     History   Chief Complaint Chief Complaint  Patient presents with  . Fatigue    HPI Judith [name] is a 77 y.o. female.  Judith [name] is a [name] with history of dementia, asthma, diabetes, GERD, hypertension, hyperlipidemia, obesity, who presents to the ED from [facility] for evaluation of fatigue.  Patient was seen here on Wednesday for the same symptoms facility denies any changes just reports they still feel that she is not acting like herself.  On Wednesday she was sent for hyperglycemia, was diagnosed with a UTI and sent home on Keflex.  She has been tolerating antibiotics but [facility] facility felt that she still was not at her baseline, but did not feel that she was worsening. Facility reports they are continuing to have difficulty controlling her blood sugar use and that they just did not think she was acting herself.  She denies any acute complaints no chest pain, shortness of breath, abdominal pain, nausea or vomiting, dysuria, numbness weakness or tingling in any of her extremities or headache.  Level 5 caveat: Dementia     Past Medical History:  Diagnosis Date  . Anxiety   . Arthritis    "left leg" (05/17/2013)  . Asthma   . 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 Closed right ankle fracture   . Colitis 09/06/02  . Diabetes mellitus without complication ([place])   . Fracture of femur, distal, right, closed ([place]) 03/01/2012  . GERD (gastroesophageal reflux disease)   . QQIWLNLG(921.1)    "probably weekly" (05/17/2013)  . Hemorrhoid   . High cholesterol   . History of stomach ulcers   . Hyperlipidemia   . Hypertension   . Insulin dependent diabetes mellitus ([place]) 03/01/2012  . Obesity (BMI 30-39.9) 03/03/2012  . Pneumonia    "once" (05/17/2013)  . Proctocolitis 09/04/02    Patient Active Problem List   Diagnosis Date  Noted  . Uterine hypertrophy 06/11/2015  . Kidney lesion, native, bilateral 06/11/2015  . Protein-calorie malnutrition, severe ([place]) 09/22/2013  . Dementia 09/21/2013  . Constipation 09/21/2013  . Rectal bleeding 09/21/2013  . Fecal impaction ([place]) 09/21/2013  . BRBPR (bright red blood per rectum) 09/21/2013  . Altered mental status 05/17/2013  . Altered mental state 05/17/2013  . Delirium due to another medical condition 02/18/2013  . Acute blood loss anemia 03/03/2012  . 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 Fall 03/03/2012  . Obesity (BMI 30-39.9) 03/03/2012  . Poor nutrition 03/03/2012  . Fracture of femur, distal, right, closed ([place]) 03/01/2012  . UTI (lower urinary tract infection) 03/01/2012  . Insulin dependent diabetes mellitus ([place]) 03/01/2012  . Hypertension     Past Surgical History:  Procedure Laterality Date  . ABDOMINAL HYSTERECTOMY    . BELOW KNEE LEG AMPUTATION Right 02/2008  . BREAST LUMPECTOMY Bilateral    "not cancer"  . CATARACT EXTRACTION W/ INTRAOCULAR LENS  IMPLANT, BILATERAL Bilateral   . ORIF FEMUR FRACTURE  03/01/2012   Procedure: OPEN REDUCTION INTERNAL FIXATION (ORIF) DISTAL FEMUR FRACTURE;  Surgeon: Rozanna Box, MD;  Location: [facility];  Service: Orthopedics;  Laterality: Right;  ORIF right femur     OB History    Gravida  0   Para  0   Term  0   Preterm  0   AB  0  Living        SAB  0   TAB  0   Ectopic  0   Multiple      Live Births               Home Medications    Prior to Admission medications   Medication Sig Start Date End Date Taking? Authorizing Provider  acetaminophen (TYLENOL) 650 MG CR tablet Take 650 mg by mouth every 4 (four) hours as needed for pain.    [provider]  Amino Acids-Protein Hydrolys (FEEDING SUPPLEMENT, PRO-STAT SUGAR FREE 64,) LIQD Take 30 mLs by mouth 2 (two) times daily.    [provider]  aspirin 325 MG tablet Take 325 mg by mouth daily with breakfast.     [provider]  atorvastatin (LIPITOR) 20 MG tablet Take 20 mg by mouth every evening.     [provider]  cephALEXin (KEFLEX) 500 MG capsule Take 1 capsule (500 mg total) by mouth 2 (two) times daily. 04/21/17   Khatri, Hina, PA-C  chlorhexidine (PERIDEX) 0.12 % solution Use as directed 15 mLs in the mouth or throat 2 (two) times daily. For mouth sores after brushing teeth. Swish for seconds. Do Not rinse mouth or brush teeth after use.    [provider]  cloNIDine (CATAPRES) 0.1 MG tablet Take 0.1 mg by mouth 2 (two) times daily.    [provider]  Dextrose, Diabetic Use, (INSTA-GLUCOSE) 77.4 % GEL Take 1 vial by mouth daily as needed (Diabetes, Give when CBG is <60).    [provider]  docusate sodium (COLACE) 100 MG capsule Take 1 capsule (100 mg total) by mouth every 12 (twelve) hours. Patient not taking: Reported on 12/24/2016 05/26/15   Orpah Greek, MD  hydrocerin (EUCERIN) CREA Apply 1 application topically 2 (two) times daily. Applied to bilateral legs twice a day    [provider]  hydrocortisone (ANUSOL-HC) 2.5 % rectal cream Place 1 application rectally every 12 (twelve) hours as needed for hemorrhoids or itching.    [provider]  hydrocortisone cream (HYDROCORTISONE MAX ST) 1 % Apply 1 application topically 3 (three) times daily as needed for itching.    [provider]  insulin aspart (NOVOLOG) 100 UNIT/ML injection Inject 0-15 Units into the skin 3 (three) times daily before meals. Per sliding scale   0-150 = 0 units 151-200 = 3 units 201-250 = 5 units 251-300 = 7 units 301-350 = 9 units 351-400 = 11 units 401-450 = 13 units 451-500 = 15 units and call provider    [provider]  insulin detemir (LEVEMIR) 100 UNIT/ML injection Inject 0.1 mLs (10 Units total) into the skin daily with lunch. Patient taking differently: Inject 5 Units into the skin at bedtime.  09/22/13   Waldon Merl, PA-C    lisinopril (PRINIVIL,ZESTRIL) 5 MG tablet Take 5 mg by mouth daily.    [provider]  loperamide (IMODIUM) 2 MG capsule Take 2-4 mg by mouth as needed for diarrhea or loose stools.    [provider]  LORazepam (ATIVAN) 0.5 MG tablet Take 1 tablet (0.5 mg total) by mouth every 6 (six) hours as needed for anxiety. 04/28/14   Larence Penning, MD  magnesium hydroxide (MILK OF MAGNESIA) 400 MG/5ML suspension Take 30 mLs by mouth daily as needed for mild constipation.    [provider]  megestrol (MEGACE) 400 MG/10ML suspension Take 800 mg by mouth daily.  [provider]  metoprolol succinate (TOPROL-XL) 50 MG 24 hr tablet Take 75 mg by mouth daily with breakfast. Take with or immediately following a meal.     [provider]  mirtazapine (REMERON) 7.5 MG tablet Take 7.5 mg by mouth every other day.    [provider]  polyethylene glycol (MIRALAX / GLYCOLAX) packet Take 17 g by mouth daily. Patient taking differently: Take 17 g by mouth daily with breakfast.  09/22/13   Lacy Duverney M, PA-C  ranitidine (ZANTAC) 150 MG tablet Take 150 mg by mouth every evening.     [provider]  vitamin C (ASCORBIC ACID) 500 MG tablet Take 500 mg by mouth 2 (two) times daily.    [provider]  zinc gluconate 50 MG tablet Take 50 mg by mouth daily with breakfast.    [provider]    Family History Family History  Problem Relation Age of Onset  . Diabetes Mother   . Diabetes Father   . Diabetes Sister        x3  . Diabetes Brother        x4  . Colon cancer Neg Hx   . Colon polyps Neg Hx     Social History Social History   Tobacco Use  . Smoking status: Former Smoker    Packs/day: 1.00    Years: 5.00    Pack years: 5.00    Last attempt to quit: 01/26/1993    Years since quitting: 24.2  . Smokeless tobacco: Former Systems developer  . Tobacco comment: "quit smoking at ~ age 66"  Substance Use Topics  . Alcohol use: No  . Drug  use: No     Allergies   Patient has no known allergies.   Review of Systems Review of Systems  Unable to perform ROS: Dementia     Physical Exam Updated Vital Signs BP 126/64   Pulse 74   Temp 98.1 F (36.7 C) (Oral)   Resp 15   SpO2 98%   Physical Exam  Constitutional: She appears well-developed and well-nourished. No distress.  Chronically ill but in no acute distress  HENT:  Head: Normocephalic and atraumatic.  Mucous membranes dry, oropharynx clear  Eyes: Pupils are equal, round, and reactive to light. EOM are normal. Right eye exhibits no discharge. Left eye exhibits no discharge.  No nystagmus  Neck: Normal range of motion. Neck supple.  Cardiovascular: Normal rate, regular rhythm, normal heart sounds and intact distal pulses.  Pulses:      Radial pulses are 2+ on the right side, and 2+ on the left side.       Dorsalis pedis pulses are 2+ on the right side, and 2+ on the left side.  Pulmonary/Chest: Effort normal and breath sounds normal. No stridor. No respiratory distress. She has no wheezes. She has no rales.  Respirations equal and unlabored, patient able to speak in full sentences, lungs clear to auscultation bilaterally  Abdominal: Soft. Bowel sounds are normal. She exhibits no distension and no mass. There is no tenderness. There is no guarding.  Abdomen is soft, bowel sounds present in all quadrants, nontender to palpation without peritoneal signs  Musculoskeletal: She exhibits no edema or deformity.  Neurological: She is alert. Coordination normal.  Speech is clear, able to follow commands CN III-XII intact Normal strength in upper and lower extremities bilaterally including dorsiflexion and plantar flexion, strong and equal grip strength Sensation normal to light and sharp touch Moves extremities without ataxia,  coordination intact  Skin: Skin is warm and dry. Capillary refill takes less than 2 seconds. She is not diaphoretic.  Chronic appearing  osteomyelitic wound of the left foot, appears unchanged when compared to picture from 2 days ago.  Psychiatric: She has a normal mood and affect. Her behavior is normal.  Nursing note and vitals reviewed.    ED Treatments / Results  Labs (all labs ordered are listed, but only abnormal results are displayed) Labs Reviewed  CBC WITH DIFFERENTIAL/PLATELET - Abnormal; Notable for the following components:      Result Value   WBC 12.7 (*)    Hemoglobin 11.9 (*)    RDW 16.6 (*)    Neutro Abs 10.7 (*)    All other components within normal limits  BASIC METABOLIC PANEL - Abnormal; Notable for the following components:   Chloride 114 (*)    CO2 16 (*)    Glucose, Bld 232 (*)    BUN 49 (*)    Creatinine, Ser 1.89 (*)    GFR calc non Af Amer 25 (*)    GFR calc Af Amer 28 (*)    All other components within normal limits  URINALYSIS, ROUTINE W REFLEX MICROSCOPIC - Abnormal; Notable for the following components:   APPearance HAZY (*)    Leukocytes, UA LARGE (*)    Bacteria, UA MANY (*)    All other components within normal limits  CBG MONITORING, ED - Abnormal; Notable for the following components:   Glucose-Capillary 227 (*)    All other components within normal limits    EKG EKG Interpretation  Date/Time:  Friday April 23 2017 13:30:40 EDT Ventricular Rate:  88 PR Interval:    QRS Duration: 96 QT Interval:  404 QTC Calculation: 489 R Axis:   27 Text Interpretation:  Sinus rhythm Multiple ventricular premature complexes Short PR interval Nonspecific T abnormalities, lateral leads Borderline ST elevation, lateral leads Borderline prolonged QT interval Artifact in lead(s) I II III aVR aVL aVF V1 Confirmed by Lacretia Leigh ([id number]) on 04/23/2017 1:44:04 PM   Radiology Dg Chest 2 View  Result Date: 04/23/2017 CLINICAL DATA:  Altered mental status EXAM: CHEST - 2 VIEW COMPARISON:  04/19/2016 FINDINGS: No acute pulmonary infiltrate or effusion. Stable borderline cardiomegaly with  aortic atherosclerosis. No pneumothorax. Probable scarring at the left lung base. IMPRESSION: No active cardiopulmonary disease. Probable scarring at the left lung base. Electronically Signed   By: Donavan Foil M.D.   On: 04/23/2017 14:56   Ct Head Wo Contrast  Result Date: 04/23/2017 CLINICAL DATA:  Altered mental status. EXAM: CT HEAD WITHOUT CONTRAST TECHNIQUE: Contiguous axial images were obtained from the base of the skull through the vertex without intravenous contrast. COMPARISON:  05/17/2013 FINDINGS: Brain: There is no evidence of acute infarct, intracranial hemorrhage, mass, midline shift, or extra-axial fluid collection. 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 There may be a tiny right cerebellar infarct as well. Periventricular white matter hypodensities are similar to the prior study and nonspecific but compatible with mild chronic small vessel ischemic disease. 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 Mild prominence of the ventricles is favored to reflect mildly progressive cerebral atrophy rather than hydrocephalus. Vascular: Mild calcified atherosclerosis at the skull base. No hyperdense vessel. Skull: No fracture or suspicious osseous lesion. Sinuses/Orbits: Visualized paranasal sinuses and mastoid air cells are clear. Bilateral cataract extraction. Other: None. IMPRESSION: 1. No evidence of acute intracranial abnormality. 2. Mild chronic small vessel ischemic disease and  cerebral atrophy. Electronically Signed   By: Logan Bores M.D.   On: 04/23/2017 16:26    Procedures Procedures (including critical care time)  Medications Ordered in ED Medications  sodium chloride 0.9 % bolus 1,000 mL (1,000 mLs Intravenous New Bag/Given 04/23/17 1536)     Initial Impression / Assessment and Plan / ED Course  I have reviewed the triage vital signs and the nursing notes.  Pertinent labs & imaging results that were available during my care of the patient were reviewed by  me and considered in my medical decision making (see chart for details).  Presents from [facility] for evaluation of fatigue, hyperglycemia and altered mental status, patient does have baseline history of dementia.  She was seen here 2 days ago with similar symptoms diagnosed with UTI currently on Keflex.  On arrival vitals normal and patient appears to be in no acute distress.  Lungs clear, heart RRR, and abdomen benign on exam, no focal neural deficits.  Basic labs, urinalysis, EKG chest x-ray.  Given the facility is reporting altered mental status patient has not had any recent head imaging will get CT to rule out acute intracranial cause.  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  Wound appears unchanged from picture 2 days ago.  Evaluation shows mild leukocytosis at 12.7, hemoglobin is stable, acute increase in patient's creatinine, 1.89 today, was 1.44 two days ago, BUN elevated, patient with AK I, will provide IV fluids.  CO2 16.  Glucose today is 232, no anion gap, no other electrolyte derangements.  UA continues to show large amount of leukocytes and pyuria, but labs do not suggest an acute worsening of infection.  EKG without concerning ischemic changes.  Chest x-ray shows no acute cardiopulmonary disease and CT of the head shows no acute intracranial abnormality with mild chronic small vessel ischemic disease and cerebral atrophy.  Discussed these results with the patient's family.  Given acute kidney injury patient will need to be admitted for further IV hydration and continued treatment of her UTI, urinalysis has not improved, patient may need more aggressive antibiotics and Keflex, additional urine culture sent today.  Consult [facility] hospitalist for admission.  Spoke with Dr. Alfredia Ferguson who will see and admit the patient.   Final Clinical Impressions(s) / ED Diagnoses   Final  diagnoses:  AKI (acute kidney injury) ([place])  Acute UTI  Altered mental status, unspecified altered mental status type  Hyperglycemia  Osteomyelitis of left foot, unspecified type [facility])    ED Discharge Orders    None       Jacqlyn Larsen, [place] 04/23/17 2357

## 2017-04-23 NOTE — H&P (Addendum)
History and Physical    Judith [name] YOV:[id number] DOB: February 10, 1940 DOA: 04/23/2017  PCP: Nolene Ebbs, MD   Patient coming from: [place]   Chief Complaint: AMS; Increased Weakness and Fatigue  HPI: Judith [name] is a 77 y.o. female with medical history significant of Dementia, GERD, Insulin Dependent Diabetes Mellitus, HLD, HTN, Asthma and other comorbids who was recently seen in the ED on 3/27 and Diagnosed with a UTI and placed on Keflex and sent back to the facility after ED visit. Two weeks ago she had an MRI of her Left foot which confirmed Osteomyelitis and she was supposed to follow up with Orthopedics but never made it. She re-presented to the ED today because the facility felt if she was still altered and per ED PA note "not acting like herself." Of note patient is unable to provide a subjective history due to her dementia and is only oriented to herself. She does not know what day, month, year, who our president is or what Hospital she is in but knows she is in the hospital and does not know why she is here and states she was "just asleep." No family at bedside and I attempted to call family members. I spoke with the son and who deferred me to the [place]. I called [name] but there was no answer. History was obtained through prior records and EDP. Patient is able to answer basic questions and denied any CP, SOB, N/V, Abdominal Pain or urinary discomfort or burning. I spoke to Dr. Marlou Sa in Ortho about the patient's known Osteomyelitis and he recommended transfer to [facility] for further evaluation. [facility] was called to Admit for UTI and AKI but will subsequently also be admitted for her known foot Osteomyelitis and evaluation by Orthopedics.   ED Course: Was Seen in the ED and given IV NS Bolus; Had Basic Blood work done.   Review of Systems: As per HPI otherwise 10 point review of systems negative. Denied any complaints.   Past Medical History:    Diagnosis Date  . Anxiety   . Arthritis    "left leg" (05/17/2013)  . Asthma   . 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 Closed right ankle fracture   . Colitis 09/06/02  . Diabetes mellitus without complication ([place])   . Fracture of femur, distal, right, closed ([place]) 03/01/2012  . GERD (gastroesophageal reflux disease)   . XAJOINOM(767.2)    "probably weekly" (05/17/2013)  . Hemorrhoid   . High cholesterol   . History of stomach ulcers   . Hyperlipidemia   . Hypertension   . Insulin dependent diabetes mellitus ([place]) 03/01/2012  . Obesity (BMI 30-39.9) 03/03/2012  . Pneumonia    "once" (05/17/2013)  . Proctocolitis 09/04/02   Past Surgical History:  Procedure Laterality Date  . ABDOMINAL HYSTERECTOMY    . BELOW KNEE LEG AMPUTATION Right 02/2008  . BREAST LUMPECTOMY Bilateral    "not cancer"  . CATARACT EXTRACTION W/ INTRAOCULAR LENS  IMPLANT, BILATERAL Bilateral   . ORIF FEMUR FRACTURE  03/01/2012   Procedure: OPEN REDUCTION INTERNAL FIXATION (ORIF) DISTAL FEMUR FRACTURE;  Surgeon: Rozanna Box, MD;  Location: [place];  Service: Orthopedics;  Laterality: Right;  ORIF right femur   SOCIAL HISTORY  reports that she quit smoking about 24 years ago. She has a 5.00 pack-year smoking history. She has quit using smokeless tobacco. She reports that she does not drink alcohol or use  drugs.  ALLERGIES No Known Allergies  Family History  Problem Relation Age of Onset  . Diabetes Mother   . Diabetes Father   . Diabetes Sister        x3  . Diabetes Brother        x4  . Colon cancer Neg Hx   . Colon polyps Neg Hx    Prior to Admission medications   Medication Sig Start Date End Date Taking? Authorizing Provider  Amino Acids-Protein Hydrolys (FEEDING SUPPLEMENT, PRO-STAT SUGAR FREE 64,) LIQD Take 30 mLs by mouth 2 (two) times daily.   Yes [provider]  aspirin 325 MG tablet Take 325 mg by mouth daily with breakfast.    Yes [provider]  atorvastatin  (LIPITOR) 20 MG tablet Take 20 mg by mouth every evening.    Yes [provider]  cephALEXin (KEFLEX) 500 MG capsule Take 1 capsule (500 mg total) by mouth 2 (two) times daily. 04/21/17  Yes Khatri, Hina, PA-C  chlorhexidine (PERIDEX) 0.12 % solution Use as directed 15 mLs in the mouth or throat 2 (two) times daily. For mouth sores after brushing teeth. Swish for seconds. Do Not rinse mouth or brush teeth after use.   Yes [provider]  cloNIDine (CATAPRES) 0.1 MG tablet Take 0.1 mg by mouth 2 (two) times daily.   Yes [provider]  Dextrose, Diabetic Use, (INSTA-GLUCOSE) 77.4 % GEL Take 1 vial by mouth daily as needed (Diabetes, Give when CBG is <60).   Yes [provider]  hydrocerin (EUCERIN) CREA Apply 1 application topically 2 (two) times daily. Applied to bilateral legs twice a day   Yes [provider]  hydrocortisone (ANUSOL-HC) 2.5 % rectal cream Place 1 application rectally every 12 (twelve) hours as needed for hemorrhoids or itching.   Yes [provider]  insulin aspart (NOVOLOG) 100 UNIT/ML injection Inject 0-15 Units into the skin 3 (three) times daily before meals. Per sliding scale   0-150 = 0 units 151-200 = 3 units 201-250 = 5 units 251-300 = 7 units 301-350 = 9 units 351-400 = 11 units 401-450 = 13 units 451-500 = 15 units and call provider   Yes [provider]  insulin detemir (LEVEMIR) 100 UNIT/ML injection Inject 0.1 mLs (10 Units total) into the skin daily with lunch. Patient taking differently: Inject 5 Units into the skin at bedtime.  09/22/13  Yes [name], [name], PA-C  lisinopril (PRINIVIL,ZESTRIL) 5 MG tablet Take 5 mg by mouth daily.   Yes [provider]  loperamide (IMODIUM) 2 MG capsule Take 2-4 mg by mouth as needed for diarrhea or loose stools.   Yes [provider]  LORazepam (ATIVAN) 0.5 MG tablet Take 1 tablet (0.5 mg total) by mouth every 6 (six) hours as needed for anxiety.  04/28/14  Yes Larence Penning, MD  megestrol (MEGACE) 400 MG/10ML suspension Take 800 mg by mouth daily.   Yes [provider]  metoprolol succinate (TOPROL-XL) 50 MG 24 hr tablet Take 75 mg by mouth daily with breakfast. Take with or immediately following a meal.    Yes [provider]  polyethylene glycol (MIRALAX / GLYCOLAX) packet Take 17 g by mouth daily. Patient taking differently: Take 17 g by mouth daily with breakfast.  09/22/13  Yes [name], [name], PA-C  promethazine (PHENERGAN) 25 MG tablet Take 25 mg by mouth every 6 (six) hours as needed for nausea or vomiting.   Yes [provider]  ranitidine (  ZANTAC) 150 MG tablet Take 150 mg by mouth every evening.    Yes [provider]  vitamin C (ASCORBIC ACID) 500 MG tablet Take 500 mg by mouth 2 (two) times daily.   Yes [provider]  zinc gluconate 50 MG tablet Take 50 mg by mouth daily with breakfast.   Yes [provider]  acetaminophen (TYLENOL) 650 MG CR tablet Take 650 mg by mouth every 4 (four) hours as needed for pain.    [provider]  docusate sodium (COLACE) 100 MG capsule Take 1 capsule (100 mg total) by mouth every 12 (twelve) hours. Patient not taking: Reported on 12/24/2016 05/26/15   Orpah Greek, MD  hydrocortisone cream (HYDROCORTISONE MAX ST) 1 % Apply 1 application topically 3 (three) times daily as needed for itching.    [provider]  magnesium hydroxide (MILK OF MAGNESIA) 400 MG/5ML suspension Take 30 mLs by mouth daily as needed for mild constipation.    [provider]  mirtazapine (REMERON) 7.5 MG tablet Take 7.5 mg by mouth every other day.    [provider]   Physical Exam: Vitals:   04/23/17 1419 04/23/17 1500 04/23/17 1545 04/23/17 1730  BP:  106/78 124/79 123/77  Pulse:  (!) 107 91 (!) 118  Resp:  (!) 23 14 (!) 27  Temp: 98.4 F (36.9 C)     TempSrc: Rectal     SpO2:  100% 100% 100%   Constitutional: WN/WD  obese AAF in NAD and appears calm and comfortable Eyes: Lids and conjunctivae normal, sclerae anicteric  ENMT: External Ears, Nose appear normal. Grossly normal hearing. Mucous membranes are moist. .  Neck: Appears normal, supple, no cervical masses, normal ROM, no appreciable thyromegaly; no JVD Respiratory: Diminished to auscultation bilaterally, no wheezing, rales, rhonchi or crackles. Normal respiratory effort and patient is not tachypenic. No accessory muscle use.  Cardiovascular: Slightly tachycardic, no murmurs / rubs / gallops. S1 and S2 auscultated.  Abdomen: Soft, non-tender, non-distended. No masses palpated. No appreciable hepatosplenomegaly. Bowel Sounds present GU: Deferred. Musculoskeletal: Right BKA  Skin: No rashes; has Left Lateral Foot Ulceration. No induration; Warm and dry.  Neurologic: CN 2-12 grossly intact with no focal deficits.  Romberg sign and cerebellar reflexes not assessed.  Psychiatric: Impaired judgment and insight. Awake and oriented x 1. Normal mood and appropriate affect.   Labs on Admission: I have personally reviewed following labs and imaging studies  CBC: Recent Labs  Lab 04/21/17 1520 04/23/17 1338  WBC 12.8* 12.7*  NEUTROABS 11.6* 10.7*  HGB 12.2 11.9*  HCT 36.7 36.5  MCV 81.7 83.1  PLT 343 709   Basic Metabolic Panel: Recent Labs  Lab 04/21/17 1520 04/21/17 1856 04/23/17 1338  NA 143 142 145  K 4.7 4.5 4.4  CL 111 114* 114*  CO2 17* 15* 16*  GLUCOSE 373* 322* 232*  BUN 55* 46* 49*  CREATININE 1.62* 1.42* 1.89*  CALCIUM 9.6 9.3 9.5   GFR: CrCl cannot be calculated (Unknown ideal weight.). Liver Function Tests: No results for input(s): AST, ALT, ALKPHOS, BILITOT, PROT, ALBUMIN in the last 168 hours. No results for input(s): LIPASE, AMYLASE in the last 168 hours. No results for input(s): AMMONIA in the last 168 hours. Coagulation Profile: No results for input(s): INR, PROTIME in the last 168 hours. Cardiac Enzymes: No results  for input(s): CKTOTAL, CKMB, CKMBINDEX, TROPONINI in the last 168 hours. BNP (last 3 results) No results for input(s): PROBNP in the last 8760 hours. HbA1C:  No results for input(s): HGBA1C in the last 72 hours. CBG: Recent Labs  Lab 04/21/17 1509 04/21/17 1804 04/23/17 1318  GLUCAP 362* 310* 227*   Lipid Profile: No results for input(s): CHOL, HDL, LDLCALC, TRIG, CHOLHDL, LDLDIRECT in the last 72 hours. Thyroid Function Tests: No results for input(s): TSH, T4TOTAL, FREET4, T3FREE, THYROIDAB in the last 72 hours. Anemia Panel: No results for input(s): VITAMINB12, FOLATE, FERRITIN, TIBC, IRON, RETICCTPCT in the last 72 hours. Urine analysis:    Component Value Date/Time   COLORURINE YELLOW 04/23/2017 1308   APPEARANCEUR HAZY (A) 04/23/2017 1308   LABSPEC 1.016 04/23/2017 1308   PHURINE 5.0 04/23/2017 1308   GLUCOSEU NEGATIVE 04/23/2017 1308   HGBUR NEGATIVE 04/23/2017 1308   BILIRUBINUR NEGATIVE 04/23/2017 1308   KETONESUR NEGATIVE 04/23/2017 1308   PROTEINUR NEGATIVE 04/23/2017 1308   UROBILINOGEN 0.2 10/17/2014 0849   NITRITE NEGATIVE 04/23/2017 1308   LEUKOCYTESUR LARGE (A) 04/23/2017 1308   Sepsis Labs: !!!!!!!!!!!!!!!!!!!!!!!!!!!!!!!!!!!!!!!!!!!! _0 (procalcitonin:4,lacticidven:4) ) Recent Results (from the past 240 hour(s))  Urine culture     Status: Abnormal (Preliminary result)   Collection Time: 04/21/17  5:35 PM  Result Value Ref Range Status   Specimen Description   Final    URINE, RANDOM Performed at [facility], [place] [address] [place], [place] [address]    Special Requests   Final    NONE Performed at [facility], [place] [address], [place], [place] [address]    Culture >=100,000 COLONIES/mL KLEBSIELLA PNEUMONIAE (A)  Final   Report Status PENDING  Incomplete    Radiological Exams on Admission: Dg Chest 2 View  Result Date: 04/23/2017 CLINICAL DATA:  Altered mental status EXAM: CHEST - 2 VIEW COMPARISON:   04/19/2016 FINDINGS: No acute pulmonary infiltrate or effusion. Stable borderline cardiomegaly with aortic atherosclerosis. No pneumothorax. Probable scarring at the left lung base. IMPRESSION: No active cardiopulmonary disease. Probable scarring at the left lung base. Electronically Signed   By: Donavan Foil M.D.   On: 04/23/2017 14:56   Ct Head Wo Contrast  Result Date: 04/23/2017 CLINICAL DATA:  Altered mental status. EXAM: CT HEAD WITHOUT CONTRAST TECHNIQUE: Contiguous axial images were obtained from the base of the skull through the vertex without intravenous contrast. COMPARISON:  05/17/2013 FINDINGS: Brain: There is no evidence of acute infarct, intracranial hemorrhage, mass, midline shift, or extra-axial fluid collection. 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 There may be a tiny right cerebellar infarct as well. Periventricular white matter hypodensities are similar to the prior study and nonspecific but compatible with mild chronic small vessel ischemic disease. 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 Mild prominence of the ventricles is favored to reflect mildly progressive cerebral atrophy rather than hydrocephalus. Vascular: Mild calcified atherosclerosis at the skull base. No hyperdense vessel. Skull: No fracture or suspicious osseous lesion. Sinuses/Orbits: Visualized paranasal sinuses and mastoid air cells are clear. Bilateral cataract extraction. Other: None. IMPRESSION: 1. No evidence of acute intracranial abnormality. 2. Mild chronic small vessel ischemic disease and cerebral atrophy. Electronically Signed   By: Logan Bores M.D.   On: 04/23/2017 16:26   EKG: Independently reviewed. Showed sinus rhythm with PVC's; EKG had artifact in it.   Assessment/Plan Active Problems:   Lower urinary tract infectious disease   Hypertension   Insulin dependent diabetes mellitus (HCC)   Hx of right BKA (HCC)   Altered mental status   Dementia   UTI (urinary tract  infection)   Osteomyelitis due to  type 2 diabetes mellitus ([place])   Osteomyelitis of Left Foot -Concerning as patient is Diabetic  -Admit to [facility] Telemetry given discussion with Ortho -MRI 04/09/17 showed Skin ulceration lateral to the fifth MTP joint with findings consistent with osteomyelitis throughout the proximal phalanx of the fifth toe and almost entire fifth metatarsal. There may be a small abscess adjacent to the head and neck of the fifth metatarsal. No other acute abnormality. -Had ABI as an outpatient but unable to view report  -Check Blood Cx's; -WBC was 12.7 -Check ESR, CRP, and Lactic Acid -Broad Spectrum Abx with IV Cefepime and IV Vancomycin -Given 1 liter of NS in ED; C/w Maintenance IVF at 75 mL/hr -WOC Consult -Discussed Case with Dr. Marlou Sa in Orthopedics who recommended transfer to [facility] for evaluation and Management -Follow up Orthopedic Recc's  Acute Klebsiella Pneumoniae UTI -Recent Urinalysis showed Cloudy Urine, >500  Glucose, Small Hgb, large Leukocytes, Many Bacteria, and TNTC WBC -Urine Cx from 3/27 grew out >100,000 Klebsiella and patient was placed on po Keflex -Repeat Urinalysis today showed Hazy Urine, Large Leukocytes, Many Bacteria, and TNTC WBC -I called Microbiology and Sensitivities will be run tonight on Collection from 3/27 -Repeat Urine Cx today -Broad Spectrum Abx with IV Cefepime and IV Vancomycin for Osteomyelitis in a Diabetic Foot Infection should cover UTI -Follow up Cx's  AKI on CKD Stage 3 -New baseline appears to be around 1.4-1.6 -C/w IVF Rehydration -Repeat CMP in AM and Avoid Nephrotoxic Medications  AMS/Weakness/Lethargy -Likely in the setting of Infection; Continue Treatment as above -As above Check Cx's -Head CT showed No evidence of acute intracranial abnormality. Mild chronic small vessel ischemic disease and cerebral atrophy. -Obtain PT/OT Evaluation  Metabolic Acidosis -CO2 was 16 -C/w IVF Rehydration -Check  Lactic Acid Level -Repeat CMP in AM   Insulin Dependent Diabetes Mellitus -Check HbA1c; Last one in our Records was 8.0 -C/w Levemir 10 units -Started Moderate Novolog SSI -Continue to Monitor CBG's  HLD -C/w Atorvastatin 20 mg po qHS  HTN -Hold Lisinopril 5 mg given AKI -C/w Metoprolol XL 75 mg po Daily  GERD -Cw Famotidine Substitution  DVT prophylaxis: Enoxaparin 40 mg sq q24h Code Status: FULL CODE Family Communication: Discussed Case with Son listed on Chart and advised him that she would be transferred [facility] but he deferred me to his sister in law [place]. I called [name] twice to speak to her but there was no answer both times.  Disposition Plan: [facility] to [facility] by [facility]opedics  Consults called: Orthopedics  Admission status: Inpatient Telemetry   Severity of Illness: The appropriate patient status for this patient is INPATIENT. Inpatient status is judged to be reasonable and necessary in order to provide the required intensity of service to ensure the patient's safety. The patient's presenting symptoms, physical exam findings, and initial radiographic and laboratory data in the context of their chronic comorbidities is felt to place them at high risk for further clinical deterioration. Furthermore, it is not anticipated that the patient will be medically stable for discharge from the hospital within 2 midnights of admission. The following factors support the patient status of inpatient.   " The patient's presenting symptoms include Confusion and AMS. " The worrisome physical exam findings include Left Lateral Foot Ulcer. " The initial radiographic and laboratory data are worrisome because of Osteomyelitis. " The chronic co-morbidities include HTN, HLD, Diabetes Mellitus Type 2, GERD.  * I certify that at the point of admission it  is my clinical judgment that the patient will require inpatient hospital care spanning beyond 2 midnights from the  point of admission due to high intensity of service, high risk for further deterioration and high frequency of surveillance required.Kerney Elbe, D.O. [facility] Pager [phone]  If 7PM-7AM, please contact night-coverage [email or web address] Password [facility]  04/23/2017, 7:14 PM

## 2017-04-23 NOTE — ED Notes (Signed)
Bed: WA08 Expected date:  Expected time:  Means of arrival:  Comments: 77 yo AMS, d/c from this facility yesterday

## 2017-04-24 DIAGNOSIS — L97526 Non-pressure chronic ulcer of other part of left foot with bone involvement without evidence of necrosis: Secondary | ICD-10-CM

## 2017-04-24 LAB — COMPREHENSIVE METABOLIC PANEL
ALT: 9 U/L — AB (ref 14–54)
AST: 11 U/L — AB (ref 15–41)
Albumin: 2.6 g/dL — ABNORMAL LOW (ref 3.5–5.0)
Alkaline Phosphatase: 60 U/L (ref 38–126)
Anion gap: 11 (ref 5–15)
BUN: 46 mg/dL — ABNORMAL HIGH (ref 6–20)
CHLORIDE: 122 mmol/L — AB (ref 101–111)
CO2: 18 mmol/L — AB (ref 22–32)
CREATININE: 1.36 mg/dL — AB (ref 0.44–1.00)
Calcium: 8.8 mg/dL — ABNORMAL LOW (ref 8.9–10.3)
GFR calc non Af Amer: 36 mL/min — ABNORMAL LOW (ref >60)
GFR, EST AFRICAN AMERICAN: 42 mL/min — AB (ref >60)
Glucose, Bld: 271 mg/dL — ABNORMAL HIGH (ref 65–99)
POTASSIUM: 4.1 mmol/L (ref 3.5–5.1)
SODIUM: 151 mmol/L — AB (ref 135–145)
Total Bilirubin: 0.4 mg/dL (ref 0.3–1.2)
Total Protein: 6.3 g/dL — ABNORMAL LOW (ref 6.5–8.1)

## 2017-04-24 LAB — MAGNESIUM: Magnesium: 1.6 mg/dL — ABNORMAL LOW (ref 1.7–2.4)

## 2017-04-24 LAB — HEMOGLOBIN A1C
HEMOGLOBIN A1C: 8.2 % — AB (ref 4.8–5.6)
MEAN PLASMA GLUCOSE: 188.64 mg/dL

## 2017-04-24 LAB — GLUCOSE, CAPILLARY
GLUCOSE-CAPILLARY: 210 mg/dL — AB (ref 65–99)
GLUCOSE-CAPILLARY: 128 mg/dL — AB (ref 65–99)

## 2017-04-24 LAB — CBC
HEMATOCRIT: 33.1 % — AB (ref 36.0–46.0)
Hemoglobin: 10.4 g/dL — ABNORMAL LOW (ref 12.0–15.0)
MCH: 26.3 pg (ref 26.0–34.0)
MCHC: 31.4 g/dL (ref 30.0–36.0)
MCV: 83.8 fL (ref 78.0–100.0)
PLATELETS: 296 10*3/uL (ref 150–400)
RBC: 3.95 MIL/uL (ref 3.87–5.11)
RDW: 16.8 % — ABNORMAL HIGH (ref 11.5–15.5)
WBC: 9.9 10*3/uL (ref 4.0–10.5)

## 2017-04-24 LAB — BASIC METABOLIC PANEL
Anion gap: 11 (ref 5–15)
BUN: 47 mg/dL — ABNORMAL HIGH (ref 6–20)
CHLORIDE: 118 mmol/L — AB (ref 101–111)
CO2: 16 mmol/L — AB (ref 22–32)
CREATININE: 1.4 mg/dL — AB (ref 0.44–1.00)
Calcium: 8.5 mg/dL — ABNORMAL LOW (ref 8.9–10.3)
GFR calc non Af Amer: 35 mL/min — ABNORMAL LOW (ref >60)
GFR, EST AFRICAN AMERICAN: 41 mL/min — AB (ref >60)
Glucose, Bld: 259 mg/dL — ABNORMAL HIGH (ref 65–99)
POTASSIUM: 3.9 mmol/L (ref 3.5–5.1)
SODIUM: 145 mmol/L (ref 135–145)

## 2017-04-24 LAB — CBG MONITORING, ED
GLUCOSE-CAPILLARY: 191 mg/dL — AB (ref 65–99)
Glucose-Capillary: 251 mg/dL — ABNORMAL HIGH (ref 65–99)
Glucose-Capillary: 254 mg/dL — ABNORMAL HIGH (ref 65–99)

## 2017-04-24 LAB — PHOSPHORUS: PHOSPHORUS: 3.4 mg/dL (ref 2.5–4.6)

## 2017-04-24 LAB — C-REACTIVE PROTEIN: CRP: 1 mg/dL — AB (ref <1.0)

## 2017-04-24 MED ORDER — MAGNESIUM SULFATE 2 GM/50ML IV SOLN
2.0000 g | Freq: Once | INTRAVENOUS | Status: AC
  Administered 2017-04-24: 2 g via INTRAVENOUS
  Filled 2017-04-24: qty 50

## 2017-04-24 MED ORDER — SODIUM CHLORIDE 0.9 % IV SOLN
1.0000 g | Freq: Two times a day (BID) | INTRAVENOUS | Status: DC
  Administered 2017-04-24 – 2017-04-25 (×3): 1 g via INTRAVENOUS
  Filled 2017-04-24 (×4): qty 1

## 2017-04-24 MED ORDER — DEXTROSE 5 % IV SOLN
INTRAVENOUS | Status: DC
  Administered 2017-04-24 (×2): via INTRAVENOUS

## 2017-04-24 MED ORDER — VANCOMYCIN HCL 10 G IV SOLR
1250.0000 mg | INTRAVENOUS | Status: DC
  Filled 2017-04-24: qty 1250

## 2017-04-24 NOTE — ED Notes (Signed)
Called Phlebotomy. They are in route to pt room to attempt to attain 2x sets of blood cultures.

## 2017-04-24 NOTE — Progress Notes (Signed)
New Admission Note:   Arrival Method: [facility] provided transport from [facility] ED Mental Orientation: Alert and oriented to self Telemetry: 64M01, verified x 2 Assessment: completed Skin: assessed by 2 RNs IV: R wrist with D5W at 75 mL/hr  Pain: denies Tubes: external urinary catheter Safety Measures: floor mats on floor Admission: incomplete d/t no family at bedside 63M Orientation: done Family: none at bedside Personal Belongings: bag of clotes  Orders have been reviewed and implemented. Will continue to monitor the patient. Call light has been placed within reach and bed alarm has been activated.   Manya Silvas, RN MSN [place] Phone number: [phone]

## 2017-04-24 NOTE — ED Notes (Signed)
Called Report to RN on [name], Room 17, Judith [name].

## 2017-04-24 NOTE — Progress Notes (Deleted)
New Admission Note:   Arrival Method: on hospital bed from ED Mental Orientation: Alert and oreinted to self Telemetry:  Assessment: completed Skin: assessed by 2 RNs IV: 22 gauge R wrist Pain: intermittent pain mid abdominal with coughing Tubes: external urinary catheter Safety Measures:  Admission: completed with family 78M Orientation: done Family: family at bedside Personal Belongings: family to take patient's ring home  Orders have been reviewed and implemented. Will continue to monitor the patient. Call light has been placed within reach and bed alarm has been activated.   Manya Silvas, RN MSN [place] Phone number: [phone]

## 2017-04-24 NOTE — ED Notes (Signed)
Per the daughter Ivory Maduro [phone], the pt already has a plan for her wound treatment at [facility], so she would like to be called when a doctor comes to see the pt, and before she is transferred to [facility].

## 2017-04-24 NOTE — ED Notes (Signed)
Phlebotomy is in pt room attempting to attain Blood cultures.

## 2017-04-24 NOTE — ED Notes (Signed)
Waiting for medication to be available in pyxis once verified

## 2017-04-24 NOTE — ED Notes (Signed)
UNSUCCESSFUL BLOOD CULTURE COLLECTION ATTEMPT IN LEFT ARM

## 2017-04-24 NOTE — ED Notes (Signed)
Attempted to call Report to Abigail Butts RN, RN is right in the middle of admission and will call back in 15 minutes.

## 2017-04-24 NOTE — Progress Notes (Signed)
PROGRESS NOTE    Judith [name]  BHA:[id number] DOB: 10-27-1940 DOA: 04/23/2017 PCP: Nolene Ebbs, MD   Brief Narrative:  Judith [name] is a 77 y.o. female with medical history significant of Dementia, GERD, Insulin Dependent Diabetes Mellitus, HLD, HTN, Asthma and other comorbids who was recently seen in the ED on 3/27 and Diagnosed with a UTI and placed on Keflex and sent back to the facility after ED visit. Two weeks ago she had an MRI of her Left foot which confirmed Osteomyelitis and she was supposed to follow up with Orthopedics Dr. Sharol Given but never made it. She re-presented to the ED today because the facility felt if she was still altered and per ED PA note "not acting like herself." Of note patient is unable to provide a subjective history due to her dementia and is only oriented to herself. She does not know what day, month, year, who our president is or what Hospital she is in but knows she is in the hospital and does not know why she is here and states she was "just asleep."  I spoke to Dr. Marlou Sa in Ortho about the patient's known Osteomyelitis and he recommended transfer to [facility] for further evaluation. [facility] was called to Admit for UTI and AKI but will subsequently also be admitted for her known foot Osteomyelitis and evaluation by Orthopedics. Because of bed availability she remained in [facility] overnight and will be transferred to [facility] later today for Evaluation by Orthopedics.   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 Marlou Sa. Per HCPOA she saw Mattie Marlin Dr. Latanya Maudlin who referred her to  [facility] Ortho Dr. Sharol Given; Patient to be seen by Dr. Marlou Sa today and after my discussion with Dr. Marlou Sa yesterday he stated he will likely notify Dr. Sharol Given after he evaluates the patient  -MRI 04/09/17 showed Skin ulceration lateral to the fifth MTP joint with findings consistent with osteomyelitis throughout the proximal phalanx of the fifth toe and almost entire fifth metatarsal. There may be a small abscess adjacent to the head and neck of the fifth metatarsal. No other acute abnormality. -Had ABI as an outpatient but unable to view report  -Check Blood Cx's; Never collected yesterday so re-order  -WBC was 12.7 and improved to 9.9 -Checked ESR and was 54, CRP was 1.0, and Lactic Acid now 1.2 -Broad Spectrum Abx with IV Cefepime and IV Vancomycin -Given 1 liter of NS in ED; Changed Maintenance IVF at 75 mL/hr from NS to D5W given Hypernatremia/Hyperchloremia  -WOC Consult -Discussed Case with Dr. Marlou Sa in Orthopedics who recommended transfer to [facility] for evaluation and Management; He may refer to Dr. Sharol Given  -Follow up Orthopedic Recc's  Acute Klebsiella Pneumoniae UTI -Recent Urinalysis showed Cloudy Urine, >500  Glucose, Small Hgb, large Leukocytes, Many Bacteria, and TNTC WBC -Urine Cx from 3/27 grew out >100,000 Klebsiella and patient was placed on po Keflex -Repeat Urinalysis today showed Hazy Urine, Large Leukocytes, Many Bacteria, and TNTC WBC -Sensitivities resulted and will continue Current Abx for Osteo -Repeat Urine Cx yesterday pending  -Broad Spectrum  Abx with IV Cefepime and IV Vancomycin for Osteomyelitis in a Diabetic Foot Infection should cover UTI -Follow up Cx's; Blood Cx never drawn yesterday -Will Re-Order  AKI on CKD Stage 3, improving  -New baseline appears to be around 1.4-1.6 -BUN/Cr on Admission was 49/1.89 and improved to 46/1.36 -C/w IVF Rehydration as above -Repeat CMP in AM and Avoid Nephrotoxic Medications  Hypernatremia/Hyperchloremia -Patient's Na+ went  from 145 -> 151 and Cl- went from 114 -> 122 -Changed IVF from NS at 75 mL/hr to D5W at 75 mL/hr -Repeat BMP this Afternoon to follow trend   AMS/Weakness/Lethargy -Likely in the setting of Infection; Continue Treatment as above -As above Check Cx's;  -Head CT showed No evidence of acute intracranial abnormality. Mild chronic small vessel ischemic disease and cerebral atrophy. -Obtain PT/OT Evaluation  Metabolic Acidosis -CO2 was 18 -C/w IVF Rehydration and changed to D5W -Checked Lactic Acid Level and was 2.1 but improved to 1.2 -Repeat CMP in AM and check BMP later this afternoon   Insulin Dependent Diabetes Mellitus -Checked HbA1c and was 8.2; Last one in our Records was 8.0 -C/w Levemir 10 units for now and adjust as necessary -Started Moderate Novolog SSI AC/HS -Continue to Monitor CBG's; CBG's ranging from 222-254 -Consulted Diabetic Education Coordinator  HLD -C/w Atorvastatin 20 mg po qHS  HTN -Hold Lisinopril 5 mg given AKI and possibly resume in AM  -C/w Metoprolol XL 75 mg po Daily  GERD -Cw Famotidine Substitution  Hypomagnesemia -Patient's Mag Level was 1.6 this AM -Replete with IV Mag Sulfate 2 grams -Continue to Monitor and Replete as Necessary -Repeat Mag Level in AM  DVT prophylaxis: Enoxaparin 40 mg sqq24h Code Status: FULL CODE Family Communication: Discussed with HCPOA Daughter in [place] Disposition Plan: [facility] to [facility] for evaluation by [facility]opedics   Consultants:   Orthopedic Surgery  WOC Nurse   Procedures: None   Antimicrobials: Anti-infectives (From admission, onward)   Start     Dose/Rate Route Frequency Ordered Stop   04/25/17 1800  vancomycin (VANCOCIN) IVPB 750 mg/150 ml premix  Status:  Discontinued     750 mg 150 mL/hr over 60 Minutes Intravenous Every 48 hours 04/23/17 1814 04/23/17 1815   04/25/17 1800  vancomycin (VANCOCIN) IVPB 1000 mg/200 mL premix     1,000 mg 200 mL/hr over 60 Minutes Intravenous Every  48 hours 04/23/17 1815     04/24/17 1800  ceFEPIme (MAXIPIME) 1 g in sodium chloride 0.9 % 100 mL IVPB     1 g 200 mL/hr over 30 Minutes Intravenous Every 24 hours 04/23/17 1814     04/23/17 1830  vancomycin (VANCOCIN) 1,500 mg in sodium chloride 0.9 % 500 mL IVPB     1,500 mg 250 mL/hr over 120 Minutes Intravenous  Once 04/23/17 1756 04/23/17 2250   04/23/17 1800  ceFEPIme (MAXIPIME) 2 g in sodium chloride 0.9 % 100 mL IVPB     2 g 200 mL/hr over 30 Minutes Intravenous  Once 04/23/17 1756 04/23/17 1913     Subjective: Seen and examined and was more alert but pleasantly demented. No CP or SOB. No complaints. Foot was unwrapped and she had no feeling. No other complaints and updated HCPOA at bedside.   Objective: Vitals:   04/24/17 0600 04/24/17 0739 04/24/17 0800 04/24/17 0819  BP: (!) 157/80 (!) 155/72 (!) 139/99 (!) 139/99  Pulse: 86 93 91 94  Resp: (!) 22 (!) 26 (!) 22   Temp: 98.8 F (37.1 C) 98.8 F (  37.1 C)    TempSrc: Oral Oral    SpO2: 99% 100% 100%     Intake/Output Summary (Last 24 hours) at 04/24/2017 0911 Last data filed at 04/23/2017 2250 Gross per 24 hour  Intake 1600 ml  Output -  Net 1600 ml   There were no vitals filed for this visit.  Examination: Physical Exam:  Constitutional: WN/WD obese AAF in NAD and appears calm and comfortable Eyes: Lids and conjunctivae normal, sclerae anicteric  ENMT: External Ears, Nose appear normal. Grossly normal hearing. Mucous membranes are moist.   Neck: Appears normal, supple, no cervical masses, normal ROM, no appreciable thyromegaly; No JVD Respiratory: Diminished to auscultation bilaterally, no wheezing, rales, rhonchi or crackles. Normal respiratory effort and patient is not tachypenic. No accessory muscle use.  Cardiovascular: RRR with PACs, no murmurs / rubs / gallops. S1 and S2 auscultated. No extremity edema. Abdomen: Soft, non-tender, non-distended. No masses palpated. No appreciable hepatosplenomegaly. Bowel  sounds positive.  GU: Deferred. Musculoskeletal: Right BKA;   Skin: Left Lateral foot ulceration undressed and not purulent. No induration; Warm and dry.  Neurologic: CN 2-12 grossly intact with no focal deficits. Psychiatric: Impairedjudgment and insight. Alert but not oriented x 3. Normal and pleasant mood and appropriate affect.   Data Reviewed: I have personally reviewed following labs and imaging studies  CBC: Recent Labs  Lab 04/21/17 1520 04/23/17 1338 04/24/17 0631  WBC 12.8* 12.7* 9.9  NEUTROABS 11.6* 10.7*  --   HGB 12.2 11.9* 10.4*  HCT 36.7 36.5 33.1*  MCV 81.7 83.1 83.8  PLT 343 305 161   Basic Metabolic Panel: Recent Labs  Lab 04/21/17 1520 04/21/17 1856 04/23/17 1338 04/24/17 0631  NA 143 142 145 151*  K 4.7 4.5 4.4 4.1  CL 111 114* 114* 122*  CO2 17* 15* 16* 18*  GLUCOSE 373* 322* 232* 271*  BUN 55* 46* 49* 46*  CREATININE 1.62* 1.42* 1.89* 1.36*  CALCIUM 9.6 9.3 9.5 8.8*  MG  --   --   --  1.6*  PHOS  --   --   --  3.4   GFR: CrCl cannot be calculated (Unknown ideal weight.). Liver Function Tests: Recent Labs  Lab 04/24/17 0631  AST 11*  ALT 9*  ALKPHOS 60  BILITOT 0.4  PROT 6.3*  ALBUMIN 2.6*   No results for input(s): LIPASE, AMYLASE in the last 168 hours. No results for input(s): AMMONIA in the last 168 hours. Coagulation Profile: No results for input(s): INR, PROTIME in the last 168 hours. Cardiac Enzymes: No results for input(s): CKTOTAL, CKMB, CKMBINDEX, TROPONINI in the last 168 hours. BNP (last 3 results) No results for input(s): PROBNP in the last 8760 hours. HbA1C: Recent Labs    04/23/17 2215  HGBA1C 8.2*   CBG: Recent Labs  Lab 04/21/17 1804 04/23/17 1318 04/23/17 2202 04/24/17 0640 04/24/17 0759  GLUCAP 310* 227* 222* 251* 254*   Lipid Profile: No results for input(s): CHOL, HDL, LDLCALC, TRIG, CHOLHDL, LDLDIRECT in the last 72 hours. Thyroid Function Tests: Recent Labs    04/23/17 2215  TSH 0.543    Anemia Panel: No results for input(s): VITAMINB12, FOLATE, FERRITIN, TIBC, IRON, RETICCTPCT in the last 72 hours. Sepsis Labs: Recent Labs  Lab 04/23/17 2006 04/23/17 2215  LATICACIDVEN 2.1* 1.2    Recent Results (from the past 240 hour(s))  Urine culture     Status: Abnormal (Preliminary result)   Collection Time: 04/21/17  5:35 PM  Result Value Ref Range Status  Specimen Description   Final    URINE, RANDOM Performed at [facility], [place] [address], [place], [place] [address]    Special Requests   Final    NONE Performed at [facility], [place] [address] [place], [place] [address]    Culture (A)  Final    >=100,000 COLONIES/mL KLEBSIELLA PNEUMONIAE CULTURE REINCUBATED FOR BETTER GROWTH Performed at [facility], [place] [address] [place], [place] [address]    Report Status PENDING  Incomplete   Organism ID, Bacteria KLEBSIELLA PNEUMONIAE (A)  Final      Susceptibility   Klebsiella pneumoniae - MIC*    AMPICILLIN >=32 RESISTANT Resistant     CEFAZOLIN <=4 SENSITIVE Sensitive     CEFTRIAXONE <=1 SENSITIVE Sensitive     CIPROFLOXACIN <=0.25 SENSITIVE Sensitive     GENTAMICIN <=1 SENSITIVE Sensitive     IMIPENEM <=0.25 SENSITIVE Sensitive     NITROFURANTOIN 64 INTERMEDIATE Intermediate     TRIMETH/SULFA <=20 SENSITIVE Sensitive     AMPICILLIN/SULBACTAM 4 SENSITIVE Sensitive     PIP/TAZO <=4 SENSITIVE Sensitive     Extended ESBL NEGATIVE Sensitive     * >=100,000 COLONIES/mL KLEBSIELLA PNEUMONIAE    Radiology Studies: Dg Chest 2 View  Result Date: 04/23/2017 CLINICAL DATA:  Altered mental status EXAM: CHEST - 2 VIEW COMPARISON:  04/19/2016 FINDINGS: No acute pulmonary infiltrate or effusion. Stable borderline cardiomegaly with aortic atherosclerosis. No pneumothorax. Probable scarring at the left lung base. IMPRESSION: No active cardiopulmonary disease. Probable scarring at the left lung base. Electronically Signed   By: Donavan Foil M.D.   On: 04/23/2017 14:56   Ct Head Wo Contrast  Result Date: 04/23/2017 CLINICAL DATA:  Altered mental status. EXAM: CT HEAD WITHOUT CONTRAST TECHNIQUE: Contiguous axial images were obtained from the base of the skull through the vertex without intravenous contrast. COMPARISON:  05/17/2013 FINDINGS: Brain: There is no evidence of acute infarct, intracranial hemorrhage, mass, midline shift, or extra-axial fluid collection. 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 There may be a tiny right cerebellar infarct as well. Periventricular white matter hypodensities are similar to the prior study and nonspecific but compatible with mild chronic small vessel ischemic disease. 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 Mild prominence of the ventricles is favored to reflect mildly progressive cerebral atrophy rather than hydrocephalus. Vascular: Mild calcified atherosclerosis at the skull base. No hyperdense vessel. Skull: No fracture or suspicious osseous lesion. Sinuses/Orbits: Visualized paranasal sinuses and mastoid air cells are clear. Bilateral cataract extraction. Other: None. IMPRESSION: 1. No evidence of acute intracranial abnormality. 2. Mild chronic small vessel ischemic disease and cerebral atrophy. Electronically Signed   By: Logan Bores M.D.   On: 04/23/2017 16:26   Scheduled Meds: . acetaminophen  650 mg Oral Q8H  . aspirin  325 mg Oral Q breakfast  . atorvastatin  20 mg Oral QPM  . cloNIDine  0.1 mg Oral BID  . docusate sodium  100 mg Oral Q12H  . enoxaparin (LOVENOX) injection  40 mg Subcutaneous QHS  . famotidine  20 mg Oral Daily  . feeding supplement (PRO-STAT SUGAR FREE 64)  30 mL Oral BID  . insulin aspart  0-15 Units Subcutaneous TID WC  . insulin aspart  0-5 Units Subcutaneous QHS  . insulin detemir  10 Units Subcutaneous Q lunch  . metoprolol succinate  75 mg Oral Q breakfast  . polyethylene glycol  17 g Oral Q breakfast  . vitamin  C  500 mg Oral  BID   Continuous Infusions: . ceFEPime (MAXIPIME) IV    . dextrose    . magnesium sulfate 1 - 4 g bolus IVPB    . [START ON 04/25/2017] vancomycin      LOS: 1 day   Kerney Elbe, DO [facility] Pager [phone]  If 7PM-7AM, please contact night-coverage [email or web address] Password [email or web address] 04/24/2017, 9:11 AM

## 2017-04-24 NOTE — Progress Notes (Signed)
Pharmacy Antibiotic Note  Judith [name] is a 77 y.o. female admitted on 04/23/2017 with osteomyelitis.  Pharmacy has been consulted for vancomycin and cefepime dosing.  On Keflex PTA for UTI. 3/29 CXR neg, 3/29 head CT neg. 3/15 MRI left foot:osteomyelitis throughout the proximal phalanx of the fifth toe and almost entire fifth metatarsal   Today, 04/24/2017:  SCr improved overnight  Original BCx not obtained, collected today  Afebrile; WBC normalized  Plan: Based on improved renal function:  Increase cefepime to 2g IV q12 hr  Increase vancomycin 1250 mg IV q48 for (AUC 496 using SCr 1.36)  Q48 SCr while on vanc   Temp (24hrs), Avg:98.5 F (36.9 C), Min:98.1 F (36.7 C), Max:98.8 F (37.1 C)  Recent Labs  Lab 04/21/17 1520 04/21/17 1856 04/23/17 1338 04/23/17 2006 04/23/17 2215 04/24/17 0631  WBC 12.8*  --  12.7*  --   --  9.9  CREATININE 1.62* 1.42* 1.89*  --   --  1.36*  LATICACIDVEN  --   --   --  2.1* 1.2  --     CrCl cannot be calculated (Unknown ideal weight.).    No Known Allergies  Antimicrobials this admission: 3/29 vanc >> 3/29 cefepime >>  Dose adjustments this admission:  Microbiology results: 3/27 Ucx: > 100 K Klebsiella R-Amp, NTF 3/5 L foot- mod corynebacterium sp. 3/29 Ucx:  3/30 BCx:   Thank you for allowing pharmacy to be a part of this patient's care.  Reuel Boom, PharmD, BCPS [phone] 04/24/2017, 12:31 PM

## 2017-04-24 NOTE — Progress Notes (Signed)
Left foot ulcer 5 th mt present No fluctuance uti present by history Plan [facility] to [facility] duda will eval Monday am for surgery next week

## 2017-04-24 NOTE — ED Notes (Signed)
[facility] has been called and are sending a truck to [facility].

## 2017-04-25 DIAGNOSIS — N179 Acute kidney failure, unspecified: Principal | ICD-10-CM

## 2017-04-25 DIAGNOSIS — I10 Essential (primary) hypertension: Secondary | ICD-10-CM

## 2017-04-25 DIAGNOSIS — Z794 Long term (current) use of insulin: Secondary | ICD-10-CM

## 2017-04-25 DIAGNOSIS — F0390 Unspecified dementia without behavioral disturbance: Secondary | ICD-10-CM

## 2017-04-25 DIAGNOSIS — R739 Hyperglycemia, unspecified: Secondary | ICD-10-CM

## 2017-04-25 DIAGNOSIS — E119 Type 2 diabetes mellitus without complications: Secondary | ICD-10-CM

## 2017-04-25 DIAGNOSIS — R4182 Altered mental status, unspecified: Secondary | ICD-10-CM

## 2017-04-25 DIAGNOSIS — N390 Urinary tract infection, site not specified: Secondary | ICD-10-CM

## 2017-04-25 LAB — COMPREHENSIVE METABOLIC PANEL
ALBUMIN: 2.1 g/dL — AB (ref 3.5–5.0)
ALT: 7 U/L — ABNORMAL LOW (ref 14–54)
AST: 12 U/L — AB (ref 15–41)
Alkaline Phosphatase: 50 U/L (ref 38–126)
Anion gap: 7 (ref 5–15)
BUN: 34 mg/dL — AB (ref 6–20)
CHLORIDE: 112 mmol/L — AB (ref 101–111)
CO2: 17 mmol/L — AB (ref 22–32)
CREATININE: 1.1 mg/dL — AB (ref 0.44–1.00)
Calcium: 8.1 mg/dL — ABNORMAL LOW (ref 8.9–10.3)
GFR calc Af Amer: 55 mL/min — ABNORMAL LOW (ref >60)
GFR calc non Af Amer: 47 mL/min — ABNORMAL LOW (ref >60)
GLUCOSE: 247 mg/dL — AB (ref 65–99)
POTASSIUM: 3.9 mmol/L (ref 3.5–5.1)
Sodium: 136 mmol/L (ref 135–145)
Total Bilirubin: 0.6 mg/dL (ref 0.3–1.2)
Total Protein: 4.9 g/dL — ABNORMAL LOW (ref 6.5–8.1)

## 2017-04-25 LAB — CBC WITH DIFFERENTIAL/PLATELET
BASOS ABS: 0 10*3/uL (ref 0.0–0.1)
BASOS PCT: 0 %
EOS PCT: 1 %
Eosinophils Absolute: 0.1 10*3/uL (ref 0.0–0.7)
HCT: 29 % — ABNORMAL LOW (ref 36.0–46.0)
Hemoglobin: 9.1 g/dL — ABNORMAL LOW (ref 12.0–15.0)
LYMPHS PCT: 21 %
Lymphs Abs: 1.7 10*3/uL (ref 0.7–4.0)
MCH: 25.6 pg — ABNORMAL LOW (ref 26.0–34.0)
MCHC: 31.4 g/dL (ref 30.0–36.0)
MCV: 81.7 fL (ref 78.0–100.0)
MONO ABS: 0.8 10*3/uL (ref 0.1–1.0)
Monocytes Relative: 10 %
NEUTROS ABS: 5.4 10*3/uL (ref 1.7–7.7)
Neutrophils Relative %: 68 %
PLATELETS: 248 10*3/uL (ref 150–400)
RBC: 3.55 MIL/uL — AB (ref 3.87–5.11)
RDW: 16.5 % — AB (ref 11.5–15.5)
WBC: 7.9 10*3/uL (ref 4.0–10.5)

## 2017-04-25 LAB — URINE CULTURE: Culture: >=100000 — AB

## 2017-04-25 LAB — GLUCOSE, CAPILLARY
GLUCOSE-CAPILLARY: 239 mg/dL — AB (ref 65–99)
GLUCOSE-CAPILLARY: 226 mg/dL — AB (ref 65–99)
GLUCOSE-CAPILLARY: 314 mg/dL — AB (ref 65–99)
Glucose-Capillary: 47 mg/dL — ABNORMAL LOW (ref 65–99)
Glucose-Capillary: 97 mg/dL (ref 65–99)

## 2017-04-25 LAB — MAGNESIUM: MAGNESIUM: 1.9 mg/dL (ref 1.7–2.4)

## 2017-04-25 LAB — PHOSPHORUS: PHOSPHORUS: 2.2 mg/dL — AB (ref 2.5–4.6)

## 2017-04-25 LAB — MRSA PCR SCREENING: MRSA BY PCR: NEGATIVE

## 2017-04-25 MED ORDER — VANCOMYCIN HCL IN DEXTROSE 750-5 MG/150ML-% IV SOLN
750.0000 mg | Freq: Two times a day (BID) | INTRAVENOUS | Status: DC
  Administered 2017-04-25 – 2017-04-27 (×4): 750 mg via INTRAVENOUS
  Filled 2017-04-25 (×5): qty 150

## 2017-04-25 MED ORDER — SODIUM CHLORIDE 0.9 % IV SOLN
2.0000 g | INTRAVENOUS | Status: DC
  Administered 2017-04-26 – 2017-04-27 (×2): 2 g via INTRAVENOUS
  Filled 2017-04-25 (×2): qty 2

## 2017-04-25 MED ORDER — SODIUM BICARBONATE 8.4 % IV SOLN
INTRAVENOUS | Status: DC
  Administered 2017-04-25 – 2017-04-26 (×2): via INTRAVENOUS
  Filled 2017-04-25 (×6): qty 100

## 2017-04-25 MED ORDER — METRONIDAZOLE IN NACL 5-0.79 MG/ML-% IV SOLN
500.0000 mg | Freq: Three times a day (TID) | INTRAVENOUS | Status: DC
  Administered 2017-04-25 – 2017-04-27 (×5): 500 mg via INTRAVENOUS
  Filled 2017-04-25 (×5): qty 100

## 2017-04-25 NOTE — Progress Notes (Signed)
Paged MD regarding pt's daughter request,  Pt is stable, appetite is not that great, IVABX continue, Family members in bed side and is updating, will continue to monitor  Palma Holter, RN

## 2017-04-25 NOTE — Consult Note (Signed)
[facility] Nurse wound consult note Reason for Consult: Full thickness tissue loss with necrotic tissue.  Patient  to be seen by Dr. Sharol Given early this week for consideration of amputation. Patient already has sustained an above the knee amputation on the right.  Wound type: Neuropathic Pressure Injury POA: NA Measurement:2.5cm x 1.5cm x 0.5cm with extended depth beneath the remaining eschar Wound bed:75% red, 25% eschar (black, dry) Drainage (amount, consistency, odor) small amount of serous to serosanguinous drainage on old dressing Periwound:dry, intact Dressing procedure/placement/frequency:I have placed conservative care orders until Dr. Sharol Given can see tomorrow.   [place] nursing team will not follow, but will remain available to this patient, the nursing and medical teams.  Please re-consult if needed. Thanks, Maudie Flakes, MSN, RN, [place], Arther Abbott  Pager# [phone]

## 2017-04-25 NOTE — Evaluation (Signed)
Physical Therapy Evaluation Patient Details Name: Judith [name] MRN: [id number] DOB: 1940/02/28 Today's Date: 04/25/2017   History of Present Illness  patient is a 77 yo female who presented on 04/23/2017 with altered mental status and was subsequently admitted for left foot osteomyelitis and UTI (cpm;ex medical hx inclusive of R AKA)  Clinical Impression  Orders received for PT evaluation. Patient demonstrates significant deficits in functional mobility as indicated below. Per son, patient was full assist from staff at [facility], at this time, feel return to [facility] is appropriate, will defer all further needs. Would recommend air mattress or overlay as patient's mobility is very limited and she is at risk for skin breakdown at this time.     Follow Up Recommendations SNF(return to SNF)    Equipment Recommendations  None recommended by PT    Recommendations for Other Services       Precautions / Restrictions Precautions Precautions: Fall Restrictions Weight Bearing Restrictions: No      Mobility  Bed Mobility Overal bed mobility: Needs Assistance Bed Mobility: Rolling;Supine to Sit;Sit to Supine Rolling: Mod assist   Supine to sit: Max assist Sit to supine: Max assist   General bed mobility comments: max assit for trunk elevation and positioning  Transfers                 General transfer comment: did not attempt  Ambulation/Gait                Stairs            Wheelchair Mobility    Modified Rankin (Stroke Patients Only)       Balance Overall balance assessment: Needs assistance   Sitting balance-Leahy Scale: Poor Sitting balance - Comments: required increased assist to maintain EOB sitting balance, after approx 5 minutes able to progress to intermittent hands on assist Postural control: Left lateral lean                                   Pertinent Vitals/Pain Pain Assessment: Faces Faces Pain Scale: No hurt     Home Living Family/patient expects to be discharged to:: Skilled nursing facility                 Additional Comments: per family, patient was at [facility] and they provided full assist for patient    Prior Function Level of Independence: Needs assistance   Gait / Transfers Assistance Needed: total assist for bed mobility and OOB activity via staff at [facility] per son           Hand Dominance   Dominant Hand: Right    Extremity/Trunk Assessment        Lower Extremity Assessment Lower Extremity Assessment: RLE deficits/detail;LLE deficits/detail;Generalized weakness RLE Deficits / Details: Right AKA RLE Coordination: decreased fine motor;decreased gross motor       Communication   Communication: HOH  Cognition Arousal/Alertness: Awake/alert Behavior During Therapy: Flat affect Overall Cognitive Status: Impaired/Different from baseline Area of Impairment: Orientation;Following commands;Safety/judgement;Awareness;Problem solving                 Orientation Level: Disoriented to;Place;Time;Situation     Following Commands: Follows one step commands inconsistently;Follows one step commands with increased time Safety/Judgement: Decreased awareness of safety;Decreased awareness of deficits Awareness: Intellectual Problem Solving: Slow processing;Decreased initiation;Difficulty sequencing;Requires verbal cues;Requires tactile cues        General Comments General comments (skin integrity,  edema, etc.): hygiene and pericare performed during session    Exercises     Assessment/Plan    PT Assessment All further PT needs can be met in the next venue of care  PT Problem List Decreased strength;Decreased activity tolerance;Decreased balance;Decreased mobility;Decreased cognition       PT Treatment Interventions      PT Goals (Current goals can be found in the Care Plan section)  Acute Rehab PT Goals PT Goal Formulation: With family     Frequency     Barriers to discharge        Co-evaluation               AM-PAC PT "6 Clicks" Daily Activity  Outcome Measure Difficulty turning over in bed (including adjusting bedclothes, sheets and blankets)?: Unable Difficulty moving from lying on back to sitting on the side of the bed? : Unable Difficulty sitting down on and standing up from a chair with arms (e.g., wheelchair, bedside commode, etc,.)?: Unable Help needed moving to and from a bed to chair (including a wheelchair)?: Total Help needed walking in hospital room?: Total Help needed climbing 3-5 steps with a railing? : Total 6 Click Score: 6    End of Session   Activity Tolerance: Patient limited by fatigue Patient left: in bed;with call bell/phone within reach;with bed alarm set;with family/visitor present Nurse Communication: Mobility status PT Visit Diagnosis: Muscle weakness (generalized) (M62.81)    Time: 9012-2241 PT Time Calculation (min) (ACUTE ONLY): 18 min   Charges:   PT Evaluation $PT Eval Moderate Complexity: 1 Mod     PT G Codes:        Alben Deeds, PT DPT  Board Certified Neurologic Specialist [phone]   Duncan Dull 04/25/2017, 11:42 AM

## 2017-04-25 NOTE — Progress Notes (Signed)
Pharmacy Antibiotic Note  Judith [name] is a 77 y.o. female admitted on 04/23/2017 with osteomyelitis.  Pharmacy has been consulted for vancomycin and cefepime dosing.  On Keflex PTA for UTI. 3/29 CXR neg, 3/29 head CT neg. 3/15 MRI left foot:osteomyelitis throughout the proximal phalanx of the fifth toe and almost entire fifth metatarsal   Improved renal function from SCr 1.4>1.1 with CrCl ~ 68mL/min  Plan: Change vancomycin to 750mg  IV q 12h Change cefepime to 2g IV q 24h F/u LOT and ortho recommendations  Weight: 163 lb 2.3 oz (74 kg) Temp (24hrs), Avg:98.7 F (37.1 C), Min:98.4 F (36.9 C), Max:98.8 F (37.1 C)  Recent Labs  Lab 04/21/17 1520 04/21/17 1856 04/23/17 1338 04/23/17 2006 04/23/17 2215 04/24/17 0631 04/24/17 1500 04/25/17 0521  WBC 12.8*  --  12.7*  --   --  9.9  --  7.9  CREATININE 1.62* 1.42* 1.89*  --   --  1.36* 1.40* 1.10*  LATICACIDVEN  --   --   --  2.1* 1.2  --   --   --     CrCl cannot be calculated (Unknown ideal weight.).    No Known Allergies  Antimicrobials this admission: 3/29 vanc >> 3/29 cefepime >>  Microbiology results: 3/27 Ucx: > 100 K Klebsiella R-Amp, NTF 3/5 L foot- mod corynebacterium sp. 3/29 Ucx: Pend 3/30 BCx: NGTD  Bertis Ruddy, PharmD Pharmacy Resident Pager #: [phone] 04/25/2017 1:32 PM

## 2017-04-25 NOTE — Progress Notes (Addendum)
Patient ID: Judith [name], female   DOB: [birth year], 77 y.o.   MRN: [id number]  PROGRESS NOTE    Judith [name]  EHU:[id number] DOB: 1940-12-14 DOA: 04/23/2017 PCP: Nolene Ebbs, MD   Brief Narrative:  77 year old female with history of dementia, GERD, insulin-dependent diabetes mellitus, hyperlipidemia, hypertension, asthma who was recently seen in the ED on 04/21/2017 and diagnosed with a UTI and placed on Keflex and sent back to the facility.  2 weeks ago she had an MRI of her left foot with confirmed osteomyelitis and was supposed to follow-up with orthopedic/Dr. Sharol Given but never made it.  Patient presented on 04/23/2017 with altered mental status and was subsequently admitted for left foot osteomyelitis and UTI and started on antibiotics.  Orthopedics was consulted who recommended transfer to [facility] for further evaluation and treatment.  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 There may be a small abscess adjacent to the head and neck of the fifth metatarsal.  -Had ABI as an outpatient but unable to view report  -Orthopedics following.  Waiting for evaluation by Dr. Sharol Given a.m. for need for further surgical intervention  Acute Klebsiella Pneumoniae UTI -Continue cefepime  AKI on CKD Stage 3 with metabolic acidosis -Improving -Change IV fluids to sodium bicarb drip at 50 cc an hour  Hypernatremia/Hyperchloremia -Improving.  Repeat a.m. labs.  Fluid plan as above  Altered mental status in a  patient with history of dementia -Probably in the setting of infection -Head CT showed no evidence of acute intracranial abnormality. Mild chronic small vessel ischemic disease and cerebral atrophy. -Obtain PT/OT Evaluation -Monitor mental status.  Currently confused.   Insulin Dependent Diabetes Mellitus with hyperglycemia -IMA globin A1c 8.2.  Increase Levemir to 15 units subcu daily.  Continue Accu-Cheks with coverage  Hyperlipidemia -Continue atorvastatin  Hypertension -Pressure stable.  Continue metoprolol.  Lisinopril on hold for acute kidney injury.  GERD -Continue famotidine  Hypomagnesemia -Improved  Dementia -Monitor mental status. -SLP evaluation     DVT prophylaxis: Lovenox Code Status: Full Family Communication: None at bedside Disposition Plan: Depends on further outcome and recommendations from orthopedics  Consultants: Orthopedics  Procedures: None  Antimicrobials: Cefepime and vancomycin from 04/23/2017 onwards   Subjective: Patient seen and examined at bedside.  She is sleepy, hardly wakes up on calling her name, does not answer much questions.  No overnight fever or vomiting.  Objective: Vitals:   04/24/17 1400 04/24/17 1638 04/24/17 2030 04/25/17 0436  BP: (!) 163/84 134/79 125/67 138/73  Pulse: 89 84 98 84  Resp: 17 18 16 15   Temp:  98.8 F (37.1 C) 98.8 F (37.1 C) 98.4 F (36.9 C)  TempSrc:  Oral    SpO2: 100%  100% 100%  Weight:  74.9 kg (165 lb 2 oz) 74 kg (163 lb 2.3 oz)     Intake/Output Summary (Last 24 hours) at 04/25/2017 1119 Last data filed at 04/25/2017 0600 Gross per 24 hour  Intake 650 ml  Output -  Net 650 ml  Filed Weights   04/24/17 1638 04/24/17 2030  Weight: 74.9 kg (165 lb 2 oz) 74 kg (163 lb 2.3 oz)    Examination:  General exam: Elderly female lying in bed.  No distress. Respiratory system: Bilateral decreased breath sounds at bases Cardiovascular system: S2 heard, rate controlled  gastrointestinal  system: Abdomen is nondistended, soft and nontender. Normal bowel sounds heard. Central nervous system: Sleepy, hardly wakes up on calling her name, confused. no focal neurological deficits. Moving extremities Extremities: No cyanosis, clubbing; Trace edema.  Left foot dressing present.  Right BKA  skin: No other rashes, lesions or ulcers Psychiatry: Cannot be assessed because of mental status    Data Reviewed: I have personally reviewed following labs and imaging studies  CBC: Recent Labs  Lab 04/21/17 1520 04/23/17 1338 04/24/17 0631 04/25/17 0521  WBC 12.8* 12.7* 9.9 7.9  NEUTROABS 11.6* 10.7*  --  5.4  HGB 12.2 11.9* 10.4* 9.1*  HCT 36.7 36.5 33.1* 29.0*  MCV 81.7 83.1 83.8 81.7  PLT 343 305 296 976   Basic Metabolic Panel: Recent Labs  Lab 04/21/17 1856 04/23/17 1338 04/24/17 0631 04/24/17 1500 04/25/17 0521  NA 142 145 151* 145 136  K 4.5 4.4 4.1 3.9 3.9  CL 114* 114* 122* 118* 112*  CO2 15* 16* 18* 16* 17*  GLUCOSE 322* 232* 271* 259* 247*  BUN 46* 49* 46* 47* 34*  CREATININE 1.42* 1.89* 1.36* 1.40* 1.10*  CALCIUM 9.3 9.5 8.8* 8.5* 8.1*  MG  --   --  1.6*  --  1.9  PHOS  --   --  3.4  --  2.2*   GFR: CrCl cannot be calculated (Unknown ideal weight.). Liver Function Tests: Recent Labs  Lab 04/24/17 0631 04/25/17 0521  AST 11* 12*  ALT 9* 7*  ALKPHOS 60 50  BILITOT 0.4 0.6  PROT 6.3* 4.9*  ALBUMIN 2.6* 2.1*   No results for input(s): LIPASE, AMYLASE in the last 168 hours. No results for input(s): AMMONIA in the last 168 hours. Coagulation Profile: No results for input(s): INR, PROTIME in the last 168 hours. Cardiac Enzymes: No results for input(s): CKTOTAL, CKMB, CKMBINDEX, TROPONINI in the last 168 hours. BNP (last 3 results) No results for input(s): PROBNP in the last 8760 hours. HbA1C: Recent Labs    04/23/17 2215  HGBA1C 8.2*   CBG: Recent Labs  Lab 04/24/17 0759 04/24/17 1248 04/24/17 1640 04/24/17 2254 04/25/17 0751  GLUCAP  254* 191* 210* 128* 239*   Lipid Profile: No results for input(s): CHOL, HDL, LDLCALC, TRIG, CHOLHDL, LDLDIRECT in the last 72 hours. Thyroid Function Tests: Recent Labs    04/23/17 2215  TSH 0.543   Anemia Panel: No results for input(s): VITAMINB12, FOLATE, FERRITIN, TIBC, IRON, RETICCTPCT in the last 72 hours. Sepsis Labs: Recent Labs  Lab 04/23/17 2006 04/23/17 2215  LATICACIDVEN 2.1* 1.2    Recent Results (from the past 240 hour(s))  Urine culture     Status: Abnormal   Collection Time: 04/21/17  5:35 PM  Result Value Ref Range Status   Specimen Description   Final    URINE, RANDOM Performed at [facility] [address], [place], [place] [address]    Special Requests   Final    NONE Performed at [facility], [place] [address], [place], [place] [address]    Culture (A)  Final    >=100,000 COLONIES/mL KLEBSIELLA PNEUMONIAE 30,000 COLONIES/mL ESCHERICHIA COLI    Report Status 04/25/2017 FINAL  Final   Organism ID, Bacteria KLEBSIELLA PNEUMONIAE (A)  Final   Organism ID, Bacteria ESCHERICHIA COLI (A)  Final      Susceptibility   Escherichia coli - MIC*    AMPICILLIN 4 SENSITIVE Sensitive     CEFAZOLIN <=4 SENSITIVE Sensitive     CEFTRIAXONE <=1 SENSITIVE Sensitive     CIPROFLOXACIN <=0.25 SENSITIVE Sensitive     GENTAMICIN <=1 SENSITIVE Sensitive     IMIPENEM <=0.25 SENSITIVE Sensitive     NITROFURANTOIN <=16 SENSITIVE Sensitive     TRIMETH/SULFA <=20 SENSITIVE Sensitive     AMPICILLIN/SULBACTAM <=2 SENSITIVE Sensitive     PIP/TAZO <=4 SENSITIVE Sensitive     Extended ESBL NEGATIVE Sensitive     * 30,000 COLONIES/mL ESCHERICHIA COLI   Klebsiella pneumoniae - MIC*    AMPICILLIN >=32 RESISTANT Resistant     CEFAZOLIN <=4 SENSITIVE Sensitive     CEFTRIAXONE <=1 SENSITIVE Sensitive     CIPROFLOXACIN <=0.25 SENSITIVE Sensitive     GENTAMICIN <=1 SENSITIVE Sensitive     IMIPENEM <=0.25 SENSITIVE Sensitive     NITROFURANTOIN  64 INTERMEDIATE Intermediate     TRIMETH/SULFA <=20 SENSITIVE Sensitive     AMPICILLIN/SULBACTAM 4 SENSITIVE Sensitive     PIP/TAZO <=4 SENSITIVE Sensitive     Extended ESBL NEGATIVE Sensitive     * >=100,000 COLONIES/mL KLEBSIELLA PNEUMONIAE  Urine culture     Status: Abnormal (Preliminary result)   Collection Time: 04/23/17  1:08 PM  Result Value Ref Range Status   Specimen Description   Final    URINE, CATHETERIZED Performed at [facility], [place] [address] [place], [place] [address]    Special Requests   Final    NONE Performed at [facility], [place] [address], [place], [place] [address]    Culture 30,000 COLONIES/mL GRAM NEGATIVE RODS (A)  Final   Report Status PENDING  Incomplete  MRSA PCR Screening     Status: None   Collection Time: 04/24/17 10:38 PM  Result Value Ref Range Status   MRSA by PCR NEGATIVE NEGATIVE Final    Comment:        The GeneXpert MRSA Assay (FDA approved for NASAL specimens only), is one component of a comprehensive MRSA colonization surveillance program. It is not intended to diagnose MRSA infection nor to guide or monitor treatment for MRSA infections. Performed at [facility], [place] [address] [place], [place] [address]          Radiology Studies: Dg Chest 2 View  Result Date: 04/23/2017 CLINICAL DATA:  Altered mental status EXAM: CHEST - 2 VIEW COMPARISON:  04/19/2016 FINDINGS: No acute pulmonary infiltrate or effusion. Stable borderline cardiomegaly with aortic atherosclerosis. No pneumothorax. Probable scarring at the left lung base. IMPRESSION: No active cardiopulmonary disease. Probable scarring at the left lung base. Electronically Signed   By: Donavan Foil M.D.   On: 04/23/2017 14:56   Ct Head Wo Contrast  Result Date: 04/23/2017 CLINICAL DATA:  Altered mental status. EXAM: CT HEAD WITHOUT CONTRAST TECHNIQUE: Contiguous axial images were obtained from the base of the skull through the  vertex without intravenous contrast. COMPARISON:  05/17/2013 FINDINGS: Brain: There is no evidence of acute infarct, intracranial hemorrhage, mass, midline shift, or extra-axial fluid collection. 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 There may be a tiny right cerebellar infarct as well. Periventricular white matter hypodensities are similar to the prior study and nonspecific but compatible with mild chronic small vessel  ischemic disease. 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 Mild prominence of the ventricles is favored to reflect mildly progressive cerebral atrophy rather than hydrocephalus. Vascular: Mild calcified atherosclerosis at the skull base. No hyperdense vessel. Skull: No fracture or suspicious osseous lesion. Sinuses/Orbits: Visualized paranasal sinuses and mastoid air cells are clear. Bilateral cataract extraction. Other: None. IMPRESSION: 1. No evidence of acute intracranial abnormality. 2. Mild chronic small vessel ischemic disease and cerebral atrophy. Electronically Signed   By: Logan Bores M.D.   On: 04/23/2017 16:26        Scheduled Meds: . acetaminophen  650 mg Oral Q8H  . aspirin  325 mg Oral Q breakfast  . atorvastatin  20 mg Oral QPM  . cloNIDine  0.1 mg Oral BID  . docusate sodium  100 mg Oral Q12H  . enoxaparin (LOVENOX) injection  40 mg Subcutaneous QHS  . famotidine  20 mg Oral Daily  . feeding supplement (PRO-STAT SUGAR FREE 64)  30 mL Oral BID  . insulin aspart  0-15 Units Subcutaneous TID WC  . insulin aspart  0-5 Units Subcutaneous QHS  . insulin detemir  10 Units Subcutaneous Q lunch  . metoprolol succinate  75 mg Oral Q breakfast  . polyethylene glycol  17 g Oral Q breakfast  . vitamin C  500 mg Oral BID   Continuous Infusions: . ceFEPime (MAXIPIME) IV Stopped (04/25/17 0932)  .  sodium bicarbonate  infusion 1000 mL 50 mL/hr at 04/25/17 0900  . vancomycin       LOS: 2 days        Aline August, MD [facility] Pager [phone]  If 7PM-7AM, please contact night-coverage [email or web address] Password [email or web address] 04/25/2017, 11:19 AM

## 2017-04-25 NOTE — Progress Notes (Signed)
Wound dressing of rt foot (osteomyelitis) done after the assessment of wound nurse  Palma Holter, RN

## 2017-04-26 ENCOUNTER — Telehealth: Payer: Self-pay | Admitting: Emergency Medicine

## 2017-04-26 ENCOUNTER — Telehealth (INDEPENDENT_AMBULATORY_CARE_PROVIDER_SITE_OTHER): Payer: Self-pay | Admitting: Orthopedic Surgery

## 2017-04-26 DIAGNOSIS — E1169 Type 2 diabetes mellitus with other specified complication: Secondary | ICD-10-CM

## 2017-04-26 DIAGNOSIS — M869 Osteomyelitis, unspecified: Secondary | ICD-10-CM

## 2017-04-26 DIAGNOSIS — Z89511 Acquired absence of right leg below knee: Secondary | ICD-10-CM

## 2017-04-26 LAB — CBC WITH DIFFERENTIAL/PLATELET
BASOS PCT: 0 %
Basophils Absolute: 0 10*3/uL (ref 0.0–0.1)
EOS ABS: 0.1 10*3/uL (ref 0.0–0.7)
EOS PCT: 1 %
HCT: 33.8 % — ABNORMAL LOW (ref 36.0–46.0)
Hemoglobin: 10.6 g/dL — ABNORMAL LOW (ref 12.0–15.0)
LYMPHS ABS: 1.7 10*3/uL (ref 0.7–4.0)
Lymphocytes Relative: 22 %
MCH: 25.8 pg — AB (ref 26.0–34.0)
MCHC: 31.4 g/dL (ref 30.0–36.0)
MCV: 82.2 fL (ref 78.0–100.0)
MONO ABS: 0.9 10*3/uL (ref 0.1–1.0)
MONOS PCT: 12 %
Neutro Abs: 5.2 10*3/uL (ref 1.7–7.7)
Neutrophils Relative %: 65 %
Platelets: 287 10*3/uL (ref 150–400)
RBC: 4.11 MIL/uL (ref 3.87–5.11)
RDW: 16.6 % — AB (ref 11.5–15.5)
WBC: 8 10*3/uL (ref 4.0–10.5)

## 2017-04-26 LAB — PROTIME-INR
INR: 1.11
PROTHROMBIN TIME: 14.2 s (ref 11.4–15.2)

## 2017-04-26 LAB — URINE CULTURE: Culture: 30000 — AB

## 2017-04-26 LAB — COMPREHENSIVE METABOLIC PANEL
ALBUMIN: 2.4 g/dL — AB (ref 3.5–5.0)
ALK PHOS: 57 U/L (ref 38–126)
ALT: 7 U/L — ABNORMAL LOW (ref 14–54)
AST: 15 U/L (ref 15–41)
Anion gap: 10 (ref 5–15)
BUN: 25 mg/dL — ABNORMAL HIGH (ref 6–20)
CALCIUM: 8.5 mg/dL — AB (ref 8.9–10.3)
CHLORIDE: 109 mmol/L (ref 101–111)
CO2: 19 mmol/L — AB (ref 22–32)
Creatinine, Ser: 0.87 mg/dL (ref 0.44–1.00)
GFR calc non Af Amer: >60 mL/min (ref >60)
Glucose, Bld: 137 mg/dL — ABNORMAL HIGH (ref 65–99)
Potassium: 4.1 mmol/L (ref 3.5–5.1)
SODIUM: 138 mmol/L (ref 135–145)
Total Bilirubin: 0.7 mg/dL (ref 0.3–1.2)
Total Protein: 5.5 g/dL — ABNORMAL LOW (ref 6.5–8.1)

## 2017-04-26 LAB — C-REACTIVE PROTEIN: CRP: <0.8 mg/dL (ref <1.0)

## 2017-04-26 LAB — GLUCOSE, CAPILLARY
GLUCOSE-CAPILLARY: 235 mg/dL — AB (ref 65–99)
GLUCOSE-CAPILLARY: 105 mg/dL — AB (ref 65–99)
Glucose-Capillary: 134 mg/dL — ABNORMAL HIGH (ref 65–99)
Glucose-Capillary: 112 mg/dL — ABNORMAL HIGH (ref 65–99)

## 2017-04-26 LAB — MAGNESIUM: Magnesium: 1.8 mg/dL (ref 1.7–2.4)

## 2017-04-26 MED ORDER — INSULIN DETEMIR 100 UNIT/ML ~~LOC~~ SOLN
6.0000 [IU] | Freq: Two times a day (BID) | SUBCUTANEOUS | Status: DC
  Administered 2017-04-26 – 2017-04-27 (×2): 6 [IU] via SUBCUTANEOUS
  Filled 2017-04-26 (×4): qty 0.06

## 2017-04-26 NOTE — Telephone Encounter (Signed)
Post ED Visit - Positive Culture Follow-up  Culture report reviewed by antimicrobial stewardship pharmacist:  [x]  Elenor Quinones, Pharm.D. []  Heide Guile, Pharm.D., BCPS AQ-ID []  Parks Neptune, Pharm.D., BCPS []  Alycia Rossetti, Pharm.D., BCPS []  [place], Pharm.D., BCPS, AAHIVP []  Legrand Como, Pharm.D., BCPS, AAHIVP []  Salome Arnt, PharmD, BCPS []  Jalene Mullet, PharmD []  Vincenza Hews, PharmD, BCPS  Positive urine culture Treated with cephalexin, organism sensitive to the same and no further patient follow-up is required at this time.  Hazle Nordmann 04/26/2017, 11:13 AM

## 2017-04-26 NOTE — Telephone Encounter (Signed)
Nichola from [facility] called asking if the patient was scheduled for surgery with today, I told her I didn't see anything scheduled since she's a new patient but she wanted to speak with you to confirm this. CB # [phone]

## 2017-04-26 NOTE — Evaluation (Signed)
Occupational Therapy Evaluation and Discharge Patient Details Name: Judith [name] MRN: [id number] DOB: [birth year] Today's Date: 04/26/2017    History of Present Illness patient is a 77 yo female who presented on 04/23/2017 with altered mental status and was subsequently admitted for left foot osteomyelitis and UTI PMH: R AKA   Clinical Impression   Pt is dependent in most ADL at baseline, she can self feed with set up. No acute OT needs.     Follow Up Recommendations  Supervision/Assistance - 24 hour(return to ALF)    Equipment Recommendations       Recommendations for Other Services       Precautions / Restrictions Precautions Precautions: Fall Restrictions Weight Bearing Restrictions: No      Mobility Bed Mobility Overal bed mobility: Needs Assistance Bed Mobility: Rolling Rolling: Max assist         General bed mobility comments: use of rail  Transfers                 General transfer comment: did not attempt    Balance                                           ADL either performed or assessed with clinical judgement   ADL Overall ADL's : At baseline                                             Vision Patient Visual Report: No change from baseline       Perception     Praxis      Pertinent Vitals/Pain Pain Assessment: Faces Faces Pain Scale: No hurt     Hand Dominance Right   Extremity/Trunk Assessment Upper Extremity Assessment Upper Extremity Assessment: Generalized weakness(L weaker than R)   Lower Extremity Assessment Lower Extremity Assessment: Defer to PT evaluation RLE Deficits / Details: Right AKA       Communication Communication Communication: HOH;Expressive difficulties   Cognition Arousal/Alertness: Awake/alert Behavior During Therapy: Flat affect Overall Cognitive Status: History of cognitive impairments - at baseline Area of Impairment: Orientation;Following  commands;Safety/judgement;Awareness;Problem solving                 Orientation Level: Disoriented to;Place;Time;Situation     Following Commands: Follows one step commands inconsistently;Follows one step commands with increased time Safety/Judgement: Decreased awareness of safety;Decreased awareness of deficits Awareness: Intellectual Problem Solving: Slow processing;Decreased initiation;Difficulty sequencing;Requires verbal cues;Requires tactile cues     General Comments       Exercises     Shoulder Instructions      Home Living Family/patient expects to be discharged to:: Assisted living([facility])                                 Additional Comments: per family, patient was at [facility]F and they provided full assist for patient      Prior Functioning/Environment Level of Independence: Needs assistance  Gait / Transfers Assistance Needed: total assist for bed mobility and OOB activity via staff at [facility] per son ADL's / Homemaking Assistance Needed: self fed, assist for bathing, dressing, toileting            OT Problem List:  OT Treatment/Interventions:      OT Goals(Current goals can be found in the care plan section) Acute Rehab OT Goals Patient Stated Goal: to warm up  OT Frequency:     Barriers to D/C:            Co-evaluation              AM-PAC PT "6 Clicks" Daily Activity     Outcome Measure Help from another person eating meals?: A Little   Help from another person toileting, which includes using toliet, bedpan, or urinal?: Total Help from another person bathing (including washing, rinsing, drying)?: Total Help from another person to put on and taking off regular upper body clothing?: Total Help from another person to put on and taking off regular lower body clothing?: Total 6 Click Score: 7   End of Session    Activity Tolerance: Patient tolerated treatment well Patient left: in bed;with call bell/phone  within reach;with bed alarm set;with nursing/sitter in room  OT Visit Diagnosis: Muscle weakness (generalized) (M62.81);Other symptoms and signs involving cognitive function                Time: 8546-2703 OT Time Calculation (min): 20 min Charges:  OT General Charges $OT Visit: 1 Visit OT Evaluation $OT Eval Moderate Complexity: 1 Mod G-Codes:     May 03, 2017 Nestor Lewandowsky, OTR/L Pager: [phone]  Werner Lean, Haze Boyden May 03, 2017, 9:56 AM

## 2017-04-26 NOTE — Care Management Note (Addendum)
Case Management Note  Patient Details  Name: Judith [name] MRN: [id number] Date of Birth: April 14, 1940  Subjective/Objective:  History of Dementia, RT BKA,  Admitted for Osteomyelitis due to type 2 diabetes mellitus      Action/Plan: Prior to admission patient resides at [facility].  Expected Discharge [place] when patient medically stable/bed available.  LCSW "Lorriane Shire" following for SNF placement, Plan dc to [facility], per LCSW arrangements,   Expected Discharge Date:   To Be Determined               Expected Discharge Plan:  [place]  In-House Referral:  Clinical Social Work  Discharge planning Services  CM Consult  Status of Service:  In process, will continue to follow  Kristen Cardinal, RN  Nurse case [place] 04/26/2017, 12:29 PM

## 2017-04-26 NOTE — Telephone Encounter (Signed)
Family requested no surgery.

## 2017-04-26 NOTE — Telephone Encounter (Signed)
Is this the pt who's  Family you spoke with this morning and cancelled surgery due to failure to thrive? See message below and call nurse

## 2017-04-26 NOTE — Progress Notes (Signed)
Patient ID: Judith [name], female   DOB: May 09, 1940, 77 y.o.   MRN: [id number]  PROGRESS NOTE    Judith [name]  BZJ:[id number] DOB: 1940-12-02 DOA: 04/23/2017 PCP: Judith Ebbs, MD   Brief Narrative:  77 year old female with history of dementia, GERD, insulin-dependent diabetes mellitus, hyperlipidemia, hypertension, asthma who was recently seen in the ED on 04/21/2017 and diagnosed with a UTI and placed on Keflex and sent back to the facility.  2 weeks ago she had an MRI of her left foot with confirmed osteomyelitis and was supposed to follow-up with orthopedic/Dr. Sharol Given but never made it.  Patient presented on 04/23/2017 with altered mental status and was subsequently admitted for left foot osteomyelitis and UTI and started on antibiotics.  Orthopedics was consulted who recommended transfer to [facility] for further evaluation and treatment.  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 There may be a small abscess adjacent to the head and neck of the fifth metatarsal.  -Had ABI as an outpatient but unable to view report  -Orthopedics following.  Waiting for evaluation by Dr. Sharol Given today for need for surgical intervention  Acute Klebsiella Pneumoniae UTI -Continue cefepime  AKI on CKD Stage 3 with metabolic acidosis -Labs pending this morning.  Will get stat labs -Continue current IV fluids till we get the labs  Hypernatremia/Hyperchloremia -Improving.  Plan as  above.  Repeat labs.  Altered mental status in a patient with history of dementia -Probably in the setting of infection. -Head CT showed no evidence of acute intracranial abnormality. Mild chronic small vessel ischemic disease and cerebral atrophy. -PT/OT -Monitor mental status.  More awake this morning but still confused.   Insulin Dependent Diabetes Mellitus with hyperglycemia -Hemoglobin A1c 8.2.   -1 episode of hypoglycemia.  Monitor.  Continue Accu-Cheks with sliding scale insulin -Continue Levemir  Hyperlipidemia -Continue atorvastatin  Hypertension -Blood pressure stable.  Continue metoprolol.  Lisinopril on hold for acute kidney injury.  GERD -Continue famotidine  Hypomagnesemia -Labs pending for today  Dementia -Monitor mental status. -SLP evaluation     DVT prophylaxis: Lovenox Code Status: Full Family Communication: None at bedside Disposition Plan: Depends on further outcome and recommendations from orthopedics  Consultants: Orthopedics  Procedures: None  Antimicrobials: Cefepime and vancomycin from 04/23/2017 onwards   Subjective: Patient seen and examined at bedside.  She is more awake today but is confused.  Denies worsening foot pain.  No overnight fever or vomiting.  Objective: Vitals:   04/25/17 1000 04/25/17 1804 04/25/17 2046 04/26/17 0502  BP: (!) 142/76 137/60 (!) 91/47 107/63  Pulse: 80 79 95 (!) 107  Resp: 16 18 17 16   Temp: 98 F (36.7 C) 98 F (36.7 C) 98.6 F (37 C) 98.8 F (37.1 C)  TempSrc: Oral Oral    SpO2: 100% 100% 100% 100%  Weight:   74 kg (163 lb 2.3 oz)     Intake/Output Summary (Last 24 hours) at 04/26/2017 0175 Last data filed at 04/26/2017  0600 Gross per 24 hour  Intake 2355 ml  Output 975 ml  Net 1380 ml   Filed Weights   04/24/17 1638 04/24/17 2030 04/25/17 2046  Weight: 74.9 kg (165 lb 2 oz) 74 kg (163 lb 2.3 oz) 74 kg (163 lb 2.3 oz)    Examination:  General exam: No acute distress.  Awake but  confused Respiratory system: Bilateral decreased breath sounds at bases Cardiovascular system: S1-S2 heard, rate controlled  gastrointestinal system: Abdomen is nondistended, soft and nontender. Normal bowel sounds heard. Extremities: No cyanosis, clubbing; Trace edema.  Right BKA.  Left foot dressing present    Data Reviewed: I have personally reviewed following labs and imaging studies  CBC: Recent Labs  Lab 04/21/17 1520 04/23/17 1338 04/24/17 0631 04/25/17 0521  WBC 12.8* 12.7* 9.9 7.9  NEUTROABS 11.6* 10.7*  --  5.4  HGB 12.2 11.9* 10.4* 9.1*  HCT 36.7 36.5 33.1* 29.0*  MCV 81.7 83.1 83.8 81.7  PLT 343 305 296 025   Basic Metabolic Panel: Recent Labs  Lab 04/21/17 1856 04/23/17 1338 04/24/17 0631 04/24/17 1500 04/25/17 0521  NA 142 145 151* 145 136  K 4.5 4.4 4.1 3.9 3.9  CL 114* 114* 122* 118* 112*  CO2 15* 16* 18* 16* 17*  GLUCOSE 322* 232* 271* 259* 247*  BUN 46* 49* 46* 47* 34*  CREATININE 1.42* 1.89* 1.36* 1.40* 1.10*  CALCIUM 9.3 9.5 8.8* 8.5* 8.1*  MG  --   --  1.6*  --  1.9  PHOS  --   --  3.4  --  2.2*   GFR: CrCl cannot be calculated (Unknown ideal weight.). Liver Function Tests: Recent Labs  Lab 04/24/17 0631 04/25/17 0521  AST 11* 12*  ALT 9* 7*  ALKPHOS 60 50  BILITOT 0.4 0.6  PROT 6.3* 4.9*  ALBUMIN 2.6* 2.1*   No results for input(s): LIPASE, AMYLASE in the last 168 hours. No results for input(s): AMMONIA in the last 168 hours. Coagulation Profile: No results for input(s): INR, PROTIME in the last 168 hours. Cardiac Enzymes: No results for input(s): CKTOTAL, CKMB, CKMBINDEX, TROPONINI in the last 168 hours. BNP (last 3 results) No results for input(s): PROBNP in the last 8760 hours. HbA1C: Recent Labs    04/23/17 2215  HGBA1C 8.2*   CBG: Recent Labs  Lab 04/25/17 1212 04/25/17 1617 04/25/17 2200 04/25/17 2306 04/26/17 0737  GLUCAP 226* 314* 47* 97 235*   Lipid Profile: No results for input(s): CHOL, HDL, LDLCALC,  TRIG, CHOLHDL, LDLDIRECT in the last 72 hours. Thyroid Function Tests: Recent Labs    04/23/17 2215  TSH 0.543   Anemia Panel: No results for input(s): VITAMINB12, FOLATE, FERRITIN, TIBC, IRON, RETICCTPCT in the last 72 hours. Sepsis Labs: Recent Labs  Lab 04/23/17 2006 04/23/17 2215  LATICACIDVEN 2.1* 1.2    Recent Results (from the past 240 hour(s))  Urine culture     Status: Abnormal   Collection Time: 04/21/17  5:35 PM  Result Value Ref Range Status   Specimen Description   Final    URINE, RANDOM Performed at [place] [address] [place], [place] [address]    Special Requests   Final    NONE Performed at [facility], [place] [address] [place], [place] [address]    Culture (A)  Final    >=100,000 COLONIES/mL KLEBSIELLA PNEUMONIAE 30,000 COLONIES/mL ESCHERICHIA COLI    Report Status 04/25/2017 FINAL  Final   Organism ID, Bacteria KLEBSIELLA PNEUMONIAE (  A)  Final   Organism ID, Bacteria ESCHERICHIA COLI (A)  Final      Susceptibility   Escherichia coli - MIC*    AMPICILLIN 4 SENSITIVE Sensitive     CEFAZOLIN <=4 SENSITIVE Sensitive     CEFTRIAXONE <=1 SENSITIVE Sensitive     CIPROFLOXACIN <=0.25 SENSITIVE Sensitive     GENTAMICIN <=1 SENSITIVE Sensitive     IMIPENEM <=0.25 SENSITIVE Sensitive     NITROFURANTOIN <=16 SENSITIVE Sensitive     TRIMETH/SULFA <=20 SENSITIVE Sensitive     AMPICILLIN/SULBACTAM <=2 SENSITIVE Sensitive     PIP/TAZO <=4 SENSITIVE Sensitive     Extended ESBL NEGATIVE Sensitive     * 30,000 COLONIES/mL ESCHERICHIA COLI   Klebsiella pneumoniae - MIC*    AMPICILLIN >=32 RESISTANT Resistant     CEFAZOLIN <=4 SENSITIVE Sensitive     CEFTRIAXONE <=1 SENSITIVE Sensitive     CIPROFLOXACIN <=0.25 SENSITIVE Sensitive     GENTAMICIN <=1 SENSITIVE Sensitive     IMIPENEM <=0.25 SENSITIVE Sensitive     NITROFURANTOIN 64 INTERMEDIATE Intermediate     TRIMETH/SULFA <=20 SENSITIVE Sensitive      AMPICILLIN/SULBACTAM 4 SENSITIVE Sensitive     PIP/TAZO <=4 SENSITIVE Sensitive     Extended ESBL NEGATIVE Sensitive     * >=100,000 COLONIES/mL KLEBSIELLA PNEUMONIAE  Urine culture     Status: Abnormal   Collection Time: 04/23/17  1:08 PM  Result Value Ref Range Status   Specimen Description   Final    URINE, CATHETERIZED Performed at [facility], [place] [address] [place] [address] [place], [place] [address]    Special Requests   Final    NONE Performed at [facility], [place] [address] [place], [place] [address]    Culture 30,000 COLONIES/mL KLEBSIELLA PNEUMONIAE (A)  Final   Report Status 04/26/2017 FINAL  Final   Organism ID, Bacteria KLEBSIELLA PNEUMONIAE (A)  Final      Susceptibility   Klebsiella pneumoniae - MIC*    AMPICILLIN >=32 RESISTANT Resistant     CEFAZOLIN <=4 SENSITIVE Sensitive     CEFTRIAXONE <=1 SENSITIVE Sensitive     CIPROFLOXACIN <=0.25 SENSITIVE Sensitive     GENTAMICIN <=1 SENSITIVE Sensitive     IMIPENEM <=0.25 SENSITIVE Sensitive     NITROFURANTOIN 64 INTERMEDIATE Intermediate     TRIMETH/SULFA <=20 SENSITIVE Sensitive     AMPICILLIN/SULBACTAM 4 SENSITIVE Sensitive     PIP/TAZO <=4 SENSITIVE Sensitive     Extended ESBL NEGATIVE Sensitive     * 30,000 COLONIES/mL KLEBSIELLA PNEUMONIAE  Culture, blood (routine x 2)     Status: None (Preliminary result)   Collection Time: 04/24/17 11:28 AM  Result Value Ref Range Status   Specimen Description   Final    BLOOD RIGHT HAND Performed at [place] [address], [place], [place] [address]    Special Requests   Final    BOTTLES DRAWN AEROBIC AND ANAEROBIC Blood Culture adequate volume Performed at [place] [address], [place], [place] [address]    Culture   Final    NO GROWTH < 24 HOURS Performed at [place] [address], [place], [place] [address]    Report Status PENDING  Incomplete  Culture, blood (routine x 2)      Status: None (Preliminary result)   Collection Time: 04/24/17 11:30 AM  Result Value Ref Range Status   Specimen Description   Final    BLOOD RIGHT HAND Performed at [facility]  [place] [address], [place], [place] [address]    Special Requests   Final    BOTTLES DRAWN AEROBIC ONLY Blood Culture adequate volume Performed at [facility] [address], [place], [place] [address]    Culture   Final    NO GROWTH < 24 HOURS Performed at [place] [address], [place], [place] [address]    Report Status PENDING  Incomplete  MRSA PCR Screening     Status: None   Collection Time: 04/24/17 10:38 PM  Result Value Ref Range Status   MRSA by PCR NEGATIVE NEGATIVE Final    Comment:        The GeneXpert MRSA Assay (FDA approved for NASAL specimens only), is one component of a comprehensive MRSA colonization surveillance program. It is not intended to diagnose MRSA infection nor to guide or monitor treatment for MRSA infections. Performed at [facility], [place] [address] [place], [place] [address]          Radiology Studies: No results found.      Scheduled Meds: . acetaminophen  650 mg Oral Q8H  . aspirin  325 mg Oral Q breakfast  . atorvastatin  20 mg Oral QPM  . cloNIDine  0.1 mg Oral BID  . docusate sodium  100 mg Oral Q12H  . enoxaparin (LOVENOX) injection  40 mg Subcutaneous QHS  . famotidine  20 mg Oral Daily  . feeding supplement (PRO-STAT SUGAR FREE 64)  30 mL Oral BID  . insulin aspart  0-15 Units Subcutaneous TID WC  . insulin aspart  0-5 Units Subcutaneous QHS  . insulin detemir  10 Units Subcutaneous Q lunch  . metoprolol succinate  75 mg Oral Q breakfast  . polyethylene glycol  17 g Oral Q breakfast  . vitamin C  500 mg Oral BID   Continuous Infusions: . ceFEPime (MAXIPIME) IV    . metronidazole Stopped (04/25/17 2322)  .  sodium bicarbonate  infusion 1000 mL 50 mL/hr at 04/25/17 0900  .  vancomycin Stopped (04/26/17 7262)     LOS: 3 days        Aline August, MD [facility] Pager [phone]  If 7PM-7AM, please contact night-coverage [email or web address] Password [facility] 04/26/2017, 8:37 AM

## 2017-04-26 NOTE — Consult Note (Signed)
ORTHOPAEDIC CONSULTATION  REQUESTING PHYSICIAN: Judith August, MD  Chief Complaint: Osteomyelitis ulceration fifth metatarsal head left foot.  HPI: Judith [name] is a 77 y.o. female who presents with patient presents with chronic ulceration and osteomyelitis fifth metatarsal head left foot.  She has an above-knee amputation of the right.  Patient has been undergoing wound care treatment and is seen for further evaluation of the chronic osteomyelitis.  Past Medical History:  Diagnosis Date  . Anxiety   . Arthritis    "left leg" (05/17/2013)  . Asthma   . 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 Closed right ankle fracture   . Colitis 09/06/02  . Diabetes mellitus without complication ([place])   . Fracture of femur, distal, right, closed ([place]) 03/01/2012  . GERD (gastroesophageal reflux disease)   . GEZMOQHU(765.4)    "probably weekly" (05/17/2013)  . Hemorrhoid   . High cholesterol   . History of stomach ulcers   . Hyperlipidemia   . Hypertension   . Insulin dependent diabetes mellitus ([place]) 03/01/2012  . Obesity (BMI 30-39.9) 03/03/2012  . Pneumonia    "once" (05/17/2013)  . Proctocolitis 09/04/02   Past Surgical History:  Procedure Laterality Date  . ABDOMINAL HYSTERECTOMY    . BELOW KNEE LEG AMPUTATION Right 02/2008  . BREAST LUMPECTOMY Bilateral    "not cancer"  . CATARACT EXTRACTION W/ INTRAOCULAR LENS  IMPLANT, BILATERAL Bilateral   . ORIF FEMUR FRACTURE  03/01/2012   Procedure: OPEN REDUCTION INTERNAL FIXATION (ORIF) DISTAL FEMUR FRACTURE;  Surgeon: Judith Box, MD;  Location: [place];  Service: Orthopedics;  Laterality: Right;  ORIF right femur   Social History   Socioeconomic History  . Marital status: Single    Spouse name: Not on file  . Number of children: 7  . Years of education: Not on file  . Highest education level: Not on file  Occupational History  . Occupation: Retired  Scientific laboratory technician  . Financial resource strain: Not on file  . Food insecurity:      Worry: Not on file    Inability: Not on file  . Transportation needs:    Medical: Not on file    Non-medical: Not on file  Tobacco Use  . Smoking status: Former Smoker    Packs/day: 1.00    Years: 5.00    Pack years: 5.00    Last attempt to quit: 01/26/1993    Years since quitting: 24.2  . Smokeless tobacco: Former Systems developer  . Tobacco comment: "quit smoking at ~ age 47"  Substance and Sexual Activity  . Alcohol use: No  . Drug use: No  . Sexual activity: Never  Lifestyle  . Physical activity:    Days per week: Not on file    Minutes per session: Not on file  . Stress: Not on file  Relationships  . Social connections:    Talks on phone: Not on file    Gets together: Not on file    Attends religious service: Not on file    Active member of club or organization: Not on file    Attends meetings of clubs or organizations: Not on file    Relationship status: Not on file  Other Topics Concern  . Not on file  Social History Narrative   ** Merged History Encounter **       Pt lives at home with son   Uses prosthesis on L BKA with walker   Fairly independent  Family History  Problem Relation Age of Onset  . Diabetes Mother   . Diabetes Father   . Diabetes Sister        x3  . Diabetes Brother        x4  . Colon cancer Neg Hx   . Colon polyps Neg Hx    - negative except otherwise stated in the family history section No Known Allergies Prior to Admission medications   Medication Sig Start Date End Date Taking? Authorizing Provider  Amino Acids-Protein Hydrolys (FEEDING SUPPLEMENT, PRO-STAT SUGAR FREE 64,) LIQD Take 30 mLs by mouth 2 (two) times daily.   Yes [provider]  aspirin 325 MG tablet Take 325 mg by mouth daily with breakfast.    Yes [provider]  atorvastatin (LIPITOR) 20 MG tablet Take 20 mg by mouth every evening.    Yes [provider]  cephALEXin (KEFLEX) 500 MG capsule Take 1 capsule (500 mg total) by mouth 2 (two) times  daily. 04/21/17  Yes Khatri, Hina, PA-C  chlorhexidine (PERIDEX) 0.12 % solution Use as directed 15 mLs in the mouth or throat 2 (two) times daily. For mouth sores after brushing teeth. Swish for seconds. Do Not rinse mouth or brush teeth after use.   Yes [provider]  cloNIDine (CATAPRES) 0.1 MG tablet Take 0.1 mg by mouth 2 (two) times daily.   Yes [provider]  Dextrose, Diabetic Use, (INSTA-GLUCOSE) 77.4 % GEL Take 1 vial by mouth daily as needed (Diabetes, Give when CBG is <60).   Yes [provider]  hydrocerin (EUCERIN) CREA Apply 1 application topically 2 (two) times daily. Applied to bilateral legs twice a day   Yes [provider]  hydrocortisone (ANUSOL-HC) 2.5 % rectal cream Place 1 application rectally every 12 (twelve) hours as needed for hemorrhoids or itching.   Yes [provider]  insulin aspart (NOVOLOG) 100 UNIT/ML injection Inject 0-15 Units into the skin 3 (three) times daily before meals. Per sliding scale   0-150 = 0 units 151-200 = 3 units 201-250 = 5 units 251-300 = 7 units 301-350 = 9 units 351-400 = 11 units 401-450 = 13 units 451-500 = 15 units and call provider   Yes [provider]  insulin detemir (LEVEMIR) 100 UNIT/ML injection Inject 0.1 mLs (10 Units total) into the skin daily with lunch. Patient taking differently: Inject 5 Units into the skin at bedtime.  09/22/13  Yes Judith [name], Judith M, PA-C  lisinopril (PRINIVIL,ZESTRIL) 5 MG tablet Take 5 mg by mouth daily.   Yes [provider]  loperamide (IMODIUM) 2 MG capsule Take 2-4 mg by mouth as needed for diarrhea or loose stools.   Yes [provider]  LORazepam (ATIVAN) 0.5 MG tablet Take 1 tablet (0.5 mg total) by mouth every 6 (six) hours as needed for anxiety. 04/28/14  Yes Judith Penning, MD  megestrol (MEGACE) 400 MG/10ML suspension Take 800 mg by mouth daily.   Yes [provider]  metoprolol succinate (TOPROL-XL) 50 MG 24 hr  tablet Take 75 mg by mouth daily with breakfast. Take with or immediately following a meal.    Yes [provider]  polyethylene glycol (MIRALAX / GLYCOLAX) packet Take 17 g by mouth daily. Patient taking differently: Take 17 g by mouth daily with breakfast.  09/22/13  Yes Judith [name], Judith M, PA-C  promethazine (PHENERGAN) 25 MG tablet Take 25 mg by mouth every 6 (six) hours as needed for nausea or vomiting.  Yes [provider]  ranitidine (ZANTAC) 150 MG tablet Take 150 mg by mouth every evening.    Yes [provider]  vitamin C (ASCORBIC ACID) 500 MG tablet Take 500 mg by mouth 2 (two) times daily.   Yes [provider]  zinc gluconate 50 MG tablet Take 50 mg by mouth daily with breakfast.   Yes [provider]  acetaminophen (TYLENOL) 650 MG CR tablet Take 650 mg by mouth every 4 (four) hours as needed for pain.    [provider]  docusate sodium (COLACE) 100 MG capsule Take 1 capsule (100 mg total) by mouth every 12 (twelve) hours. Patient not taking: Reported on 12/24/2016 05/26/15   Orpah Greek, MD  hydrocortisone cream (HYDROCORTISONE MAX ST) 1 % Apply 1 application topically 3 (three) times daily as needed for itching.    [provider]  magnesium hydroxide (MILK OF MAGNESIA) 400 MG/5ML suspension Take 30 mLs by mouth daily as needed for mild constipation.    [provider]  mirtazapine (REMERON) 7.5 MG tablet Take 7.5 mg by mouth every other day.    [provider]   No results found. - pertinent xrays, CT, MRI studies were reviewed and independently interpreted  Positive ROS: All other systems have been reviewed and were otherwise negative with the exception of those mentioned in the HPI and as above.  Physical Exam: General: Alert, no acute distress Psychiatric: Patient is competent for consent with normal mood and affect Lymphatic: No axillary or cervical lymphadenopathy Cardiovascular: No  pedal edema Respiratory: No cyanosis, no use of accessory musculature GI: No organomegaly, abdomen is soft and non-tender  Skin: Patient has good granulation tissue over the fifth metatarsal head.  There is no cellulitis no odor no drainage.  Images:  @ENCIMAGES @   Neurologic: Patient does not have protective sensation bilateral lower extremities.   MUSCULOSKELETAL:  Examination patient is a stable above-the-knee amputation on the right.  Left foot has a stable ulcer over the fifth metatarsal head.  Review of the MRI scan shows chronic osteomyelitis of the fifth metatarsal.  There is no abscess.  Assessment: Assessment: Chronic osteomyelitis with ulceration left fifth metatarsal head with a stable above-knee amputation on the right.  Plan: Plan: Discussed with the patient's daughter the only surgical intervention option would be a above-the-knee amputation on the left.  Discussed that since this ulcer is stable patient has no systemic symptoms from the ulcer that continued observation with dressing changes is appropriate.  Discussed that I can follow-up in the office in 2 weeks for further evaluation and treatment.  Patient's daughter states she does not want to pursue surgical intervention at this time.  Thank you for the consult and the opportunity to see Ms. Leanord Asal, MD [facility] [phone] 12:31 PM

## 2017-04-26 NOTE — Progress Notes (Signed)
Inpatient Diabetes Program Recommendations  AACE/ADA: New Consensus Statement on Inpatient Glycemic Control (2015)  Target Ranges:  Prepandial:   less than 140 mg/dL      Peak postprandial:   less than 180 mg/dL (1-2 hours)      Critically ill patients:  140 - 180 mg/dL   Lab Results  Component Value Date   GLUCAP 134 (H) 04/26/2017   HGBA1C 8.2 (H) 04/23/2017    Review of Glycemic Control  Results for DEMMI, SINDT (MRN [id number]) as of 04/26/2017 13:19  Ref. Range 04/25/2017 16:17 04/25/2017 22:00 04/25/2017 23:06 04/26/2017 07:37 04/26/2017 11:38  Glucose-Capillary Latest Ref Range: 65 - 99 mg/dL 314 (H) 47 (L) 97 235 (H) 134 (H)   Diabetes history: Type 2 DM Outpatient Diabetes medications: Levemir 5 units daily, Novolog correction tid with meals Current orders for Inpatient glycemic control:  Levemir 10 units daily, Novolog moderate tid with meals  Inpatient Diabetes Program Recommendations:   May consider splitting Levemir to 6 units bid (to ensure that it lasts 24 hours).  Also please reducing Novolog correction to sensitive tid with meals. Text page sent.   Thanks,  Adah Perl, RN, BC-ADM Inpatient Diabetes Coordinator Pager [phone] (8a-5p)

## 2017-04-27 ENCOUNTER — Ambulatory Visit (INDEPENDENT_AMBULATORY_CARE_PROVIDER_SITE_OTHER): Payer: Self-pay | Admitting: Orthopedic Surgery

## 2017-04-27 DIAGNOSIS — N390 Urinary tract infection, site not specified: Secondary | ICD-10-CM

## 2017-04-27 DIAGNOSIS — N179 Acute kidney failure, unspecified: Secondary | ICD-10-CM

## 2017-04-27 LAB — GLUCOSE, CAPILLARY
GLUCOSE-CAPILLARY: 112 mg/dL — AB (ref 65–99)
GLUCOSE-CAPILLARY: 145 mg/dL — AB (ref 65–99)
Glucose-Capillary: 132 mg/dL — ABNORMAL HIGH (ref 65–99)
Glucose-Capillary: 85 mg/dL (ref 65–99)
Glucose-Capillary: 50 mg/dL — ABNORMAL LOW (ref 65–99)
Glucose-Capillary: 55 mg/dL — ABNORMAL LOW (ref 65–99)

## 2017-04-27 LAB — BASIC METABOLIC PANEL
Anion gap: 7 (ref 5–15)
BUN: 23 mg/dL — AB (ref 6–20)
CO2: 23 mmol/L (ref 22–32)
CREATININE: 1.09 mg/dL — AB (ref 0.44–1.00)
Calcium: 8.1 mg/dL — ABNORMAL LOW (ref 8.9–10.3)
Chloride: 108 mmol/L (ref 101–111)
GFR, EST AFRICAN AMERICAN: 55 mL/min — AB (ref >60)
GFR, EST NON AFRICAN AMERICAN: 48 mL/min — AB (ref >60)
Glucose, Bld: 124 mg/dL — ABNORMAL HIGH (ref 65–99)
Potassium: 3.6 mmol/L (ref 3.5–5.1)
SODIUM: 138 mmol/L (ref 135–145)

## 2017-04-27 LAB — CBC WITH DIFFERENTIAL/PLATELET
BASOS PCT: 0 %
Basophils Absolute: 0 10*3/uL (ref 0.0–0.1)
EOS ABS: 0.1 10*3/uL (ref 0.0–0.7)
EOS PCT: 1 %
HCT: 30.6 % — ABNORMAL LOW (ref 36.0–46.0)
HEMOGLOBIN: 9.7 g/dL — AB (ref 12.0–15.0)
LYMPHS ABS: 1.8 10*3/uL (ref 0.7–4.0)
Lymphocytes Relative: 27 %
MCH: 25.7 pg — AB (ref 26.0–34.0)
MCHC: 31.7 g/dL (ref 30.0–36.0)
MCV: 81 fL (ref 78.0–100.0)
MONO ABS: 0.7 10*3/uL (ref 0.1–1.0)
MONOS PCT: 11 %
NEUTROS PCT: 61 %
Neutro Abs: 4 10*3/uL (ref 1.7–7.7)
PLATELETS: 251 10*3/uL (ref 150–400)
RBC: 3.78 MIL/uL — ABNORMAL LOW (ref 3.87–5.11)
RDW: 16.7 % — AB (ref 11.5–15.5)
WBC: 6.6 10*3/uL (ref 4.0–10.5)

## 2017-04-27 LAB — MAGNESIUM: MAGNESIUM: 1.8 mg/dL (ref 1.7–2.4)

## 2017-04-27 MED ORDER — GLUCOSE 40 % PO GEL
ORAL | Status: AC
  Administered 2017-04-27: 37.5 g
  Filled 2017-04-27: qty 1

## 2017-04-27 MED ORDER — DEXTROSE 50 % IV SOLN
INTRAVENOUS | Status: AC
  Administered 2017-04-27: 25 mL
  Filled 2017-04-27: qty 50

## 2017-04-27 NOTE — Progress Notes (Signed)
Hypoglycemic Event  CBG: 50  Treatment: 1 tube instant glucose  Symptoms: None  Follow-up CBG: Time:21:38 CBG Result:55  Possible Reasons for Event: Unknown  Comments/MD notified:Per hypoglycemia protocol    Judith [name]

## 2017-04-27 NOTE — Progress Notes (Signed)
Hypoglycemic Event  CBG: 55  Treatment: D50 IV 25 mL  Symptoms: None  Follow-up CBG: Time:22:09 CBG Result:145  Possible Reasons for Event: Unknown  Comments/MD notified:Per hypoglycemia protocol    Judith [name]

## 2017-04-27 NOTE — Clinical Social Work Note (Signed)
Clinical Social Work Assessment  Patient Details  Name: Judith [name] MRN: [id number] Date of Birth: 1940/03/03  Date of referral:  04/23/17               Reason for consult:  Facility Placement, Discharge Planning                Permission sought to share information with:  Family Supports, Other Permission granted to share information::  No(Patient oriented to self only)  Name::     Carrolyn Leigh, Biagio Quint  Agency::     Relationship::  Daughter-in-law, son  Contact Information:  [name] - [phone] and Legrand Como [phone]  Housing/Transportation Living arrangements for the past 2 months:  Assisted Living Facility([facility]) Source of Information:  Other (Comment Required)(Daughter-in-law) Patient Interpreter Needed:  None Criminal Activity/Legal Involvement Pertinent to Current Situation/Hospitalization:  No - Comment as needed Significant Relationships:  Adult Children, Other Family Members Lives with:  Facility Resident([facility]) Do you feel safe going back to the place where you live?  Yes Need for family participation in patient care:  Yes (Comment)  Care giving concerns:  Daughter-in-law, Reganne Messerschmidt did not express any concerns regarding patient's care at ALF.  Social Worker assessment / plan:  CSW talked with patient's daughter-in-law, Chenelle Benning by phone. Per [name] she primarily handles things regarding her stay at the ALF. [name] reported that patient had been at [facility]. 4 years and the plan  Employment status:  Retired Forensic scientist:  Information systems manager, Medicaid In [place] PT Recommendations:  Home with Home Health(Patient will d/c back to ALF with PT orders) Information / Referral to community resources:  Other (Comment Required)(None needed or requested as patient from ALF and will return there at discharge)  Patient/Family's Response to care: No concerns expressed regarding care during hospitalization.  Patient/Family's Understanding of  and Emotional Response to Diagnosis, Current Treatment, and Prognosis:  Daughter-in-law knowledgeable regarding patient's health issues and need for continued care at ALF.  Emotional Assessment Appearance:  Other (Comment Required(Did not visit with patient, talked to daughter-in law) Attitude/Demeanor/Rapport:  Unable to Assess Affect (typically observed):  Unable to Assess Orientation:  Oriented to Self Alcohol / Substance use:  Tobacco Use, Alcohol Use, Illicit Drugs(Patient reported that she quit smoking, and does not drink or use illicit drugs) Psych involvement (Current and /or in the community):  No (Comment)  Discharge Needs  Concerns to be addressed:  Discharge Planning Concerns Readmission within the last 30 days:  No Current discharge risk:  None Barriers to Discharge:  No Barriers Identified   Sable Feil, LCSW 04/27/2017, 2:21 PM

## 2017-04-27 NOTE — Progress Notes (Signed)
PMT family meeting 4/3 at 7:00 am  [name], [place] [facility] Pager: [phone]  No charge note.

## 2017-04-27 NOTE — Care Management Important Message (Signed)
Important Message  Patient Details  Name: Judith [name] MRN: [id number] Date of Birth: 01-18-1941   Medicare Important Message Given:  Yes    Orbie Pyo 04/27/2017, 2:45 PM

## 2017-04-27 NOTE — Consult Note (Signed)
Consultation Note Date: 04/27/2017   Patient Name: Judith [name]  DOB: 13-Apr-1940  MRN: [id number]  Age / Sex: 77 y.o., female  PCP: Nolene Ebbs, MD Referring Physician: Aline August, MD  Reason for Consultation: Establishing goals of care  HPI/Patient Profile: 77 y.o. female  with past medical history of DM with charcots foot, colitis, asthma, arthritis, anxiety, CKD 3, dementia and previous right sided AKA.   She was admitted on 04/23/2017 with altered mental status and weakness.  She was found to have a UTI, acute kidney injury on stage 3 CKD, and osteomyelitis in the 5th digit of her left foot.  Her albumin is 2.1 - 2.4.   Clinical Assessment and Goals of Care  I have reviewed medical records including EPIC notes, labs and imaging, received report from the care team, assessed the patient and then met at the bedside and spoke to her nephew and daughter in law to arrange a larger goals of care meeting for tomorrow morning with the family.    We discussed a brief life review of the patient.  She currently lives at [facility].  Prior to admission she was able to get around in her wheel chair.  The family expressed concern that she was not eating or drinking as much and had become repeatedly dehydrated.  She has now been diagnosed with chronic osteo of the lower extremity left 5th digit.  The family has opted against surgery.  They wish for her to return to her current assisted living facility.  When I spoke with [name] [facility]) on the phone she wanted the family to gather to truly understand the patient's condition and our recommendations.  We made a plan to meet at 7:00 am on 4/3.   Primary Decision Maker:  [name]    SUMMARY OF RECOMMENDATIONS    PMT family meeting at 7:00 am. Will ask the family to consider hospice services at ALF and consider electing DNAR code status.  Code  Status/Advance Care Planning:  Full code   Symptom Management:   Per primary team.   Palliative Prophylaxis:   Aspiration    Prognosis:  Likely less than 6 months based on a combination of rapid decline, poor nutrition, non-healing wounds, chronic osteomyelitis with no surgery planned, decreased PO intake, CKD 3, and dementia.  Discharge Planning: ALF with Hospice - if family is agreeable.      Primary Diagnoses: Present on Admission: . UTI (urinary tract infection) . Altered mental status . Dementia . Hypertension . Lower urinary tract infectious disease   I have reviewed the medical record, interviewed the patient and family, and examined the patient. The following aspects are pertinent.  Past Medical History:  Diagnosis Date  . Anxiety   . Arthritis    "left leg" (05/17/2013)  . Asthma   . 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 Closed right ankle fracture   . Colitis 09/06/02  . Diabetes mellitus without complication ([place])   . Fracture of femur,  distal, right, closed ([place]) 03/01/2012  . GERD (gastroesophageal reflux disease)   . OZHYQMVH(846.9)    "probably weekly" (05/17/2013)  . Hemorrhoid   . High cholesterol   . History of stomach ulcers   . Hyperlipidemia   . Hypertension   . Insulin dependent diabetes mellitus ([place]) 03/01/2012  . Obesity (BMI 30-39.9) 03/03/2012  . Pneumonia    "once" (05/17/2013)  . Proctocolitis 09/04/02   Social History   Socioeconomic History  . Marital status: Single    Spouse name: Not on file  . Number of children: 7  . Years of education: Not on file  . Highest education level: Not on file  Occupational History  . Occupation: Retired  Scientific laboratory technician  . Financial resource strain: Not on file  . Food insecurity:    Worry: Not on file    Inability: Not on file  . Transportation needs:    Medical: Not on file    Non-medical: Not on file  Tobacco Use  . Smoking status: Former Smoker    Packs/day: 1.00    Years:  5.00    Pack years: 5.00    Last attempt to quit: 01/26/1993    Years since quitting: 24.2  . Smokeless tobacco: Former Systems developer  . Tobacco comment: "quit smoking at ~ age 57"  Substance and Sexual Activity  . Alcohol use: No  . Drug use: No  . Sexual activity: Never  Lifestyle  . Physical activity:    Days per week: Not on file    Minutes per session: Not on file  . Stress: Not on file  Relationships  . Social connections:    Talks on phone: Not on file    Gets together: Not on file    Attends religious service: Not on file    Active member of club or organization: Not on file    Attends meetings of clubs or organizations: Not on file    Relationship status: Not on file  Other Topics Concern  . Not on file  Social History Narrative   ** Merged History Encounter **       Pt lives at home with son   Uses prosthesis on L BKA with walker   Fairly independent   Family History  Problem Relation Age of Onset  . Diabetes Mother   . Diabetes Father   . Diabetes Sister        x3  . Diabetes Brother        x4  . Colon cancer Neg Hx   . Colon polyps Neg Hx    Scheduled Meds: . acetaminophen  650 mg Oral Q8H  . aspirin  325 mg Oral Q breakfast  . atorvastatin  20 mg Oral QPM  . cloNIDine  0.1 mg Oral BID  . docusate sodium  100 mg Oral Q12H  . enoxaparin (LOVENOX) injection  40 mg Subcutaneous QHS  . famotidine  20 mg Oral Daily  . feeding supplement (PRO-STAT SUGAR FREE 64)  30 mL Oral BID  . insulin aspart  0-15 Units Subcutaneous TID WC  . insulin aspart  0-5 Units Subcutaneous QHS  . insulin detemir  6 Units Subcutaneous BID  . metoprolol succinate  75 mg Oral Q breakfast  . polyethylene glycol  17 g Oral Q breakfast  . vitamin C  500 mg Oral BID   Continuous Infusions: PRN Meds:.LORazepam, magnesium hydroxide, ondansetron **OR** ondansetron (ZOFRAN) IV, promethazine No Known Allergies Review of Systems patient denies pain, constipation, insomnia.  She currently has no  complaints but I doubt she is an accurate historian.   Physical Exam  Very pleasant female, awake, appropriate, moderately demented. CV rrr with 3/6 systolic murmur resp NAD, no w/c/r Abdomen soft, nt, nd Lower ext:  Rt AKA, Left foot bandaged.  Patient does not have sensation in left foot.  Vital Signs: BP 122/68 (BP Location: Right Arm)   Pulse 84   Temp 98.5 F (36.9 C) (Oral)   Resp 18   Wt 78.6 kg (173 lb 4.5 oz)   SpO2 99%   BMI 28.84 kg/m  Pain Scale: 0-10   Pain Score: 0-No pain   SpO2: SpO2: 99 % O2 Device:SpO2: 99 % O2 Flow Rate: .   IO: Intake/output summary:   Intake/Output Summary (Last 24 hours) at 04/27/2017 1438 Last data filed at 04/27/2017 1323 Gross per 24 hour  Intake 240 ml  Output 600 ml  Net -360 ml    LBM: Last BM Date: 04/22/17 Baseline Weight: Weight: 74.9 kg (165 lb 2 oz) Most recent weight: Weight: 78.6 kg (173 lb 4.5 oz)     Palliative Assessment/Data: 30 %     Time In: 2:00 Time Out: 3:00  Time Total: 50 min. Greater than 50%  of this time was spent counseling and coordinating care related to the above assessment and plan.  Signed by: [name], PA-C Palliative Medicine Pager: [phone]  Please contact Palliative Medicine Team phone at [phone] for questions and concerns.  For individual provider: See Shea Evans

## 2017-04-27 NOTE — Progress Notes (Signed)
Patient ID: Judith [name], female   DOB: 1940/03/12, 78 y.o.   MRN: [id number]  PROGRESS NOTE    Judith [name]  WUJ:[id number] DOB: 1940-09-18 DOA: 04/23/2017 PCP: Nolene Ebbs, MD   Brief Narrative:  77 year old female with history of dementia, GERD, insulin-dependent diabetes mellitus, hyperlipidemia, hypertension, asthma who was recently seen in the ED on 04/21/2017 and diagnosed with a UTI and placed on Keflex and sent back to the facility.  2 weeks ago she had an MRI of her left foot with confirmed osteomyelitis and was supposed to follow-up with orthopedic/Dr. Sharol Given but never made it.  Patient presented on 04/23/2017 with altered mental status and was subsequently admitted for left foot osteomyelitis and UTI and started on antibiotics.  Orthopedics was consulted who recommended transfer to [facility] for further evaluation and treatment.  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 There may be a small abscess adjacent to the head and neck of the fifth metatarsal.  -Had ABI as an outpatient but unable to view report  -Dr. Sharol Given has seen the patient and after his discussion with family, the family has decided for medical treatment and no surgery at this time. -Dr. Sharol Given recommends discontinuing antibiotics. -PT eval  Question of choking of food -Nurse reported that patient was choking on food.  I have ordered SLP  evaluation  Acute Klebsiella Pneumoniae UTI -Patient received 5 days of antibiotics including cefepime.  Will discontinue antibiotics.  Currently afebrile.    AKI on CKD Stage 3 with metabolic acidosis -Improved.  Currently on bicarb drip.  Will discontinue.  Repeat a.m. labs  Hypernatremia/Hyperchloremia -Improved.  Altered mental status in a patient with history of dementia -Probably in the setting of infection. -Head CT showed no evidence of acute intracranial abnormality. Mild chronic small vessel ischemic disease and cerebral atrophy. -PT/OT -Monitor mental status.     Insulin Dependent Diabetes Mellitus with hyperglycemia -Hemoglobin A1c 8.2.   - blood sugars stable.  Continue current regimen of Levemir and Accu-Cheks with coverage.  Hyperlipidemia -Continue atorvastatin  Hypertension -Blood pressure stable.  Continue metoprolol.  Lisinopril is on hold.  GERD -Continue famotidine  Hypomagnesemia -Improved  Dementia -Monitor mental status. -SLP evaluation   DVT prophylaxis: Lovenox Code Status: Full Family Communication: Spoke to [employer] on phone  disposition Plan: Nursing home in 1-2 days  Consultants: Orthopedics  Procedures: None  Antimicrobials: Cefepime and vancomycin from 04/23/2017 onwards Flagyl from 04/25/2017 onwards   Subjective: Patient seen and examined at bedside.  She is sleepy, hardly wakes up on calling her name.  No overnight fever, nausea or vomiting  Objective: Vitals:   04/26/17 1648 04/26/17 2139 04/27/17 0613 04/27/17 0845  BP: (!) 114/54 135/80 130/75 122/68  Pulse: 82 100 90 84  Resp: 17 18 18 18   Temp: 98.7 F (37.1 C) 98.8 F (37.1 C) 98.7 F (37.1 C) 98.5 F (36.9 C)  TempSrc: Oral Oral Oral Oral  SpO2: 100% 100% 100% 99%  Weight:  78.6 kg (173 lb 4.5 oz)      Intake/Output Summary (Last 24 hours) at 04/27/2017 1249 Last data filed at 04/27/2017 2831 Gross per 24 hour  Intake 715 ml  Output 600 ml  Net 115  ml   Filed Weights   04/25/17 2046 04/26/17 1155 04/26/17 2139  Weight: 74 kg (163 lb 2.3 oz) 78.6 kg (173 lb 4.5 oz) 78.6 kg (173 lb 4.5 oz)    Examination:  General exam: No distress.  Respiratory system: Bilateral decreased breath sounds at bases Cardiovascular system: Rate controlled, S1-S2 positive  gastrointestinal system: Abdomen is nondistended, soft and nontender. Normal bowel sounds heard. Extremities: Trace edema.  Right BKA.  Left foot dressing present.  No cyanosis   Data Reviewed: I have personally reviewed following labs and imaging studies  CBC: Recent Labs  Lab 04/21/17 1520 04/23/17 1338 04/24/17 0631 04/25/17 0521 04/26/17 1132 04/27/17 0806  WBC 12.8* 12.7* 9.9 7.9 8.0 6.6  NEUTROABS 11.6* 10.7*  --  5.4 5.2 4.0  HGB 12.2 11.9* 10.4* 9.1* 10.6* 9.7*  HCT 36.7 36.5 33.1* 29.0* 33.8* 30.6*  MCV 81.7 83.1 83.8 81.7 82.2 81.0  PLT 343 305 296 248 287 517   Basic Metabolic Panel: Recent Labs  Lab 04/24/17 0631 04/24/17 1500 04/25/17 0521 04/26/17 1132 04/27/17 0806  NA 151* 145 136 138 138  K 4.1 3.9 3.9 4.1 3.6  CL 122* 118* 112* 109 108  CO2 18* 16* 17* 19* 23  GLUCOSE 271* 259* 247* 137* 124*  BUN 46* 47* 34* 25* 23*  CREATININE 1.36* 1.40* 1.10* 0.87 1.09*  CALCIUM 8.8* 8.5* 8.1* 8.5* 8.1*  MG 1.6*  --  1.9 1.8 1.8  PHOS 3.4  --  2.2*  --   --    GFR: CrCl cannot be calculated (Unknown ideal weight.). Liver Function Tests: Recent Labs  Lab 04/24/17 0631 04/25/17 0521 04/26/17 1132  AST 11* 12* 15  ALT 9* 7* 7*  ALKPHOS 60 50 57  BILITOT 0.4 0.6 0.7  PROT 6.3* 4.9* 5.5*  ALBUMIN 2.6* 2.1* 2.4*   No results for input(s): LIPASE, AMYLASE in the last 168 hours. No results for input(s): AMMONIA in the last 168 hours. Coagulation Profile: Recent Labs  Lab 04/26/17 1132  INR 1.11   Cardiac Enzymes: No results for input(s): CKTOTAL, CKMB, CKMBINDEX, TROPONINI in the last 168 hours. BNP (last 3 results) No results for input(s):  PROBNP in the last 8760 hours. HbA1C: No results for input(s): HGBA1C in the last 72 hours. CBG: Recent Labs  Lab 04/26/17 1138 04/26/17 1647 04/26/17 2138 04/27/17 0743 04/27/17 1140  GLUCAP 134* 112* 105* 112* 132*   Lipid Profile: No results for input(s): CHOL, HDL, LDLCALC, TRIG, CHOLHDL, LDLDIRECT in the last 72 hours. Thyroid Function Tests: No results for input(s): TSH, T4TOTAL, FREET4, T3FREE, THYROIDAB in the last 72 hours. Anemia Panel: No results for input(s): VITAMINB12, FOLATE, FERRITIN, TIBC, IRON, RETICCTPCT in the last 72 hours. Sepsis Labs: Recent Labs  Lab 04/23/17 2006 04/23/17 2215  LATICACIDVEN 2.1* 1.2    Recent Results (from the past 240 hour(s))  Urine culture     Status: Abnormal   Collection Time: 04/21/17  5:35 PM  Result Value Ref Range Status   Specimen Description   Final    URINE, RANDOM Performed at [facility] [address] [place], [place] [address]    Special Requests   Final    NONE  Performed at [facility], [place] [address] [place], [place] [address]    Culture (A)  Final    >=100,000 COLONIES/mL KLEBSIELLA PNEUMONIAE 30,000 COLONIES/mL ESCHERICHIA COLI    Report Status 04/25/2017 FINAL  Final   Organism ID, Bacteria KLEBSIELLA PNEUMONIAE (A)  Final   Organism ID, Bacteria ESCHERICHIA COLI (A)  Final      Susceptibility   Escherichia coli - MIC*    AMPICILLIN 4 SENSITIVE Sensitive     CEFAZOLIN <=4 SENSITIVE Sensitive     CEFTRIAXONE <=1 SENSITIVE Sensitive     CIPROFLOXACIN <=0.25 SENSITIVE Sensitive     GENTAMICIN <=1 SENSITIVE Sensitive     IMIPENEM <=0.25 SENSITIVE Sensitive     NITROFURANTOIN <=16 SENSITIVE Sensitive     TRIMETH/SULFA <=20 SENSITIVE Sensitive     AMPICILLIN/SULBACTAM <=2 SENSITIVE Sensitive     PIP/TAZO <=4 SENSITIVE Sensitive     Extended ESBL NEGATIVE Sensitive     * 30,000 COLONIES/mL ESCHERICHIA COLI   Klebsiella pneumoniae - MIC*    AMPICILLIN >=32  RESISTANT Resistant     CEFAZOLIN <=4 SENSITIVE Sensitive     CEFTRIAXONE <=1 SENSITIVE Sensitive     CIPROFLOXACIN <=0.25 SENSITIVE Sensitive     GENTAMICIN <=1 SENSITIVE Sensitive     IMIPENEM <=0.25 SENSITIVE Sensitive     NITROFURANTOIN 64 INTERMEDIATE Intermediate     TRIMETH/SULFA <=20 SENSITIVE Sensitive     AMPICILLIN/SULBACTAM 4 SENSITIVE Sensitive     PIP/TAZO <=4 SENSITIVE Sensitive     Extended ESBL NEGATIVE Sensitive     * >=100,000 COLONIES/mL KLEBSIELLA PNEUMONIAE  Urine culture     Status: Abnormal   Collection Time: 04/23/17  1:08 PM  Result Value Ref Range Status   Specimen Description   Final    URINE, CATHETERIZED Performed at [facility], [place] [address], [place], [place] [address]    Special Requests   Final    NONE Performed at [facility], [place] [address] [place], [place] [address]    Culture 30,000 COLONIES/mL KLEBSIELLA PNEUMONIAE (A)  Final   Report Status 04/26/2017 FINAL  Final   Organism ID, Bacteria KLEBSIELLA PNEUMONIAE (A)  Final      Susceptibility   Klebsiella pneumoniae - MIC*    AMPICILLIN >=32 RESISTANT Resistant     CEFAZOLIN <=4 SENSITIVE Sensitive     CEFTRIAXONE <=1 SENSITIVE Sensitive     CIPROFLOXACIN <=0.25 SENSITIVE Sensitive     GENTAMICIN <=1 SENSITIVE Sensitive     IMIPENEM <=0.25 SENSITIVE Sensitive     NITROFURANTOIN 64 INTERMEDIATE Intermediate     TRIMETH/SULFA <=20 SENSITIVE Sensitive     AMPICILLIN/SULBACTAM 4 SENSITIVE Sensitive     PIP/TAZO <=4 SENSITIVE Sensitive     Extended ESBL NEGATIVE Sensitive     * 30,000 COLONIES/mL KLEBSIELLA PNEUMONIAE  Culture, blood (routine x 2)     Status: None (Preliminary result)   Collection Time: 04/24/17 11:28 AM  Result Value Ref Range Status   Specimen Description   Final    BLOOD RIGHT HAND Performed at [facility] [address] [place], [place] [address]    Special Requests   Final    BOTTLES DRAWN AEROBIC AND  ANAEROBIC Blood Culture adequate volume Performed at [facility] [address], [place], [place] [address]    Culture   Final    NO GROWTH 3 DAYS Performed at [facility], [place] [address], [place], [place] [address]    Report Status  PENDING  Incomplete  Culture, blood (routine x 2)     Status: None (Preliminary result)   Collection Time: 04/24/17 11:30 AM  Result Value Ref Range Status   Specimen Description   Final    BLOOD RIGHT HAND Performed at [facility] [address], [place], [place] [address]    Special Requests   Final    BOTTLES DRAWN AEROBIC ONLY Blood Culture adequate volume Performed at [facility] [address] [place], [place] [address]    Culture   Final    NO GROWTH 3 DAYS Performed at [facility], [place] [address] [place], [place] [address]    Report Status PENDING  Incomplete  MRSA PCR Screening     Status: None   Collection Time: 04/24/17 10:38 PM  Result Value Ref Range Status   MRSA by PCR NEGATIVE NEGATIVE Final    Comment:        The GeneXpert MRSA Assay (FDA approved for NASAL specimens only), is one component of a comprehensive MRSA colonization surveillance program. It is not intended to diagnose MRSA infection nor to guide or monitor treatment for MRSA infections. Performed at [facility], [place] [address] [place], [place] [address]          Radiology Studies: No results found.      Scheduled Meds: . acetaminophen  650 mg Oral Q8H  . aspirin  325 mg Oral Q breakfast  . atorvastatin  20 mg Oral QPM  . cloNIDine  0.1 mg Oral BID  . docusate sodium  100 mg Oral Q12H  . enoxaparin (LOVENOX) injection  40 mg Subcutaneous QHS  . famotidine  20 mg Oral Daily  . feeding supplement (PRO-STAT SUGAR FREE 64)  30 mL Oral BID  . insulin aspart  0-15 Units Subcutaneous TID WC  . insulin aspart  0-5 Units Subcutaneous QHS  . insulin detemir  6 Units  Subcutaneous BID  . metoprolol succinate  75 mg Oral Q breakfast  . polyethylene glycol  17 g Oral Q breakfast  . vitamin C  500 mg Oral BID   Continuous Infusions: .  sodium bicarbonate  infusion 1000 mL 50 mL/hr at 04/26/17 1805     LOS: 4 days        Aline August, MD [facility] Pager [phone]  If 7PM-7AM, please contact night-coverage [email or web address] Password [facility] 04/27/2017, 12:49 PM

## 2017-04-27 NOTE — Progress Notes (Signed)
  New imminent DC orders received for this patient, patient has already been evaluated by physical therapy services on 3/31. Per son, patient dependent at baseline and resident of SNF. Patient unable to engage or actively participate in therapies. Son also stated patient was not receiving therapies at SNF. Further needs to be deferred back to SNF.  Alben Deeds, PT DPT  Board Certified Neurologic Specialist [phone]

## 2017-04-28 DIAGNOSIS — Z7189 Other specified counseling: Secondary | ICD-10-CM

## 2017-04-28 DIAGNOSIS — F0150 Vascular dementia without behavioral disturbance: Secondary | ICD-10-CM

## 2017-04-28 DIAGNOSIS — Z515 Encounter for palliative care: Secondary | ICD-10-CM

## 2017-04-28 LAB — CBC WITH DIFFERENTIAL/PLATELET
BASOS PCT: 0 %
Basophils Absolute: 0 10*3/uL (ref 0.0–0.1)
Eosinophils Absolute: 0.1 10*3/uL (ref 0.0–0.7)
Eosinophils Relative: 1 %
HEMATOCRIT: 32.7 % — AB (ref 36.0–46.0)
Hemoglobin: 10.4 g/dL — ABNORMAL LOW (ref 12.0–15.0)
Lymphocytes Relative: 23 %
Lymphs Abs: 1.5 10*3/uL (ref 0.7–4.0)
MCH: 26.1 pg (ref 26.0–34.0)
MCHC: 31.8 g/dL (ref 30.0–36.0)
MCV: 82.2 fL (ref 78.0–100.0)
MONO ABS: 1 10*3/uL (ref 0.1–1.0)
Monocytes Relative: 15 %
NEUTROS ABS: 4 10*3/uL (ref 1.7–7.7)
Neutrophils Relative %: 61 %
Platelets: 256 10*3/uL (ref 150–400)
RBC: 3.98 MIL/uL (ref 3.87–5.11)
RDW: 17.1 % — AB (ref 11.5–15.5)
WBC: 6.5 10*3/uL (ref 4.0–10.5)

## 2017-04-28 LAB — BASIC METABOLIC PANEL
ANION GAP: 10 (ref 5–15)
BUN: 31 mg/dL — AB (ref 6–20)
CALCIUM: 8.1 mg/dL — AB (ref 8.9–10.3)
CO2: 19 mmol/L — ABNORMAL LOW (ref 22–32)
Chloride: 108 mmol/L (ref 101–111)
Creatinine, Ser: 1.03 mg/dL — ABNORMAL HIGH (ref 0.44–1.00)
GFR calc Af Amer: 59 mL/min — ABNORMAL LOW (ref >60)
GFR, EST NON AFRICAN AMERICAN: 51 mL/min — AB (ref >60)
GLUCOSE: 162 mg/dL — AB (ref 65–99)
POTASSIUM: 3.6 mmol/L (ref 3.5–5.1)
SODIUM: 137 mmol/L (ref 135–145)

## 2017-04-28 LAB — GLUCOSE, CAPILLARY
Glucose-Capillary: 114 mg/dL — ABNORMAL HIGH (ref 65–99)
Glucose-Capillary: 174 mg/dL — ABNORMAL HIGH (ref 65–99)
Glucose-Capillary: 142 mg/dL — ABNORMAL HIGH (ref 65–99)
Glucose-Capillary: 156 mg/dL — ABNORMAL HIGH (ref 65–99)

## 2017-04-28 LAB — MAGNESIUM: MAGNESIUM: 1.9 mg/dL (ref 1.7–2.4)

## 2017-04-28 MED ORDER — INSULIN DETEMIR 100 UNIT/ML ~~LOC~~ SOLN
6.0000 [IU] | Freq: Every day | SUBCUTANEOUS | Status: DC
  Administered 2017-04-28: 6 [IU] via SUBCUTANEOUS
  Filled 2017-04-28: qty 0.06

## 2017-04-28 MED ORDER — INSULIN DETEMIR 100 UNIT/ML ~~LOC~~ SOLN: 6.0000 [IU] | Freq: Every day | SUBCUTANEOUS | 11 refills | Status: AC

## 2017-04-28 MED ORDER — LORAZEPAM 0.5 MG PO TABS: 0.5000 mg | ORAL_TABLET | Freq: Four times a day (QID) | ORAL | 0 refills | Status: DC | PRN

## 2017-04-28 NOTE — Evaluation (Signed)
Clinical/Bedside Swallow Evaluation Patient Details  Name: Judith [name] MRN: [id number] Date of Birth: [birth year]  Today's Date: 04/28/2017 Time: SLP Start Time (ACUTE ONLY): 1115 SLP Stop Time (ACUTE ONLY): 1130 SLP Time Calculation (min) (ACUTE ONLY): 15 min  Past Medical History:  Past Medical History:  Diagnosis Date  . Anxiety   . Arthritis    "left leg" (05/17/2013)  . Asthma   . 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 Closed right ankle fracture   . Colitis 09/06/02  . Diabetes mellitus without complication ([place])   . Fracture of femur, distal, right, closed ([place]) 03/01/2012  . GERD (gastroesophageal reflux disease)   . OEUMPNTI(144.3)    "probably weekly" (05/17/2013)  . Hemorrhoid   . High cholesterol   . History of stomach ulcers   . Hyperlipidemia   . Hypertension   . Insulin dependent diabetes mellitus ([place]) 03/01/2012  . Obesity (BMI 30-39.9) 03/03/2012  . Pneumonia    "once" (05/17/2013)  . Proctocolitis 09/04/02   Past Surgical History:  Past Surgical History:  Procedure Laterality Date  . ABDOMINAL HYSTERECTOMY    . BELOW KNEE LEG AMPUTATION Right 02/2008  . BREAST LUMPECTOMY Bilateral    "not cancer"  . CATARACT EXTRACTION W/ INTRAOCULAR LENS  IMPLANT, BILATERAL Bilateral   . ORIF FEMUR FRACTURE  03/01/2012   Procedure: OPEN REDUCTION INTERNAL FIXATION (ORIF) DISTAL FEMUR FRACTURE;  Surgeon: Rozanna Box, MD;  Location: [facility];  Service: Orthopedics;  Laterality: Right;  ORIF right femur   HPI:  77 year old female with history of dementia, GERD, insulin-dependent diabetes mellitus, hyperlipidemia, hypertension, asthma who was recently seen in the ED on 04/21/2017 and diagnosed with a UTI and placed on Keflex and sent back to the facility.  2 weeks ago she had an MRI of her left foot with confirmed osteomyelitis and was supposed to follow-up with orthopedic/Dr. Sharol Given but never made it.  Patient presented on 04/23/2017 with altered mental status and was subsequently  admitted for left foot osteomyelitis and UTI and started on antibiotics.  Orthopedics was consulted who recommended transfer to [facility] for further evaluation and treatment.  After evaluation by Dr. Sharol Given and is discussing with the family, family has opted for nonsurgical treatment. Dr. Sharol Given recommended discontinuing antibiotics.  Patient was also evaluated by palliative care and recommended hospice evaluation at assisted living facility.  Patient will be discharged back to ALF today.   Assessment / Plan / Recommendation Clinical Impression  Patient presents with a mild oropharyngeal dysphagia, typical of progressing dementia, characterized by prolonged, excessive mastication of solids, and 1 x cough post swallow with thin liquids to indicate deep penetration/aspiration. Oral transit improved with small bites of pureed solid and signs of aspiration eliminated with small controlled boluses. At this time, given prognosis and performance, would not thicken liquids unless swallowing becomes worse or uncomfortable. Recommend pureed solids (to be liberalized for pleasure based on patient/family request), thin liquids with aspiration precautions.  SLP Visit Diagnosis: Dysphagia, oropharyngeal phase (R13.12)    Aspiration Risk  Moderate aspiration risk    Diet Recommendation Dysphagia 1 (Puree);Thin liquid   Liquid Administration via: Cup;Straw Medication Administration: Whole meds with puree Supervision: Patient able to self feed;Full supervision/cueing for compensatory strategies Compensations: Slow rate;Small sips/bites Postural Changes: Seated upright at 90 degrees    Other  Recommendations Oral Care Recommendations: Oral care BID   Follow up Recommendations Home health SLP        Swallow Study  General HPI: 77 year old female with history of dementia, GERD, insulin-dependent diabetes mellitus, hyperlipidemia, hypertension, asthma who was recently seen in the ED on 04/21/2017 and  diagnosed with a UTI and placed on Keflex and sent back to the facility.  2 weeks ago she had an MRI of her left foot with confirmed osteomyelitis and was supposed to follow-up with orthopedic/Dr. Sharol Given but never made it.  Patient presented on 04/23/2017 with altered mental status and was subsequently admitted for left foot osteomyelitis and UTI and started on antibiotics.  Orthopedics was consulted who recommended transfer to [facility] for further evaluation and treatment.  After evaluation by Dr. Sharol Given and is discussing with the family, family has opted for nonsurgical treatment. Dr. Sharol Given recommended discontinuing antibiotics.  Patient was also evaluated by palliative care and recommended hospice evaluation at assisted living facility.  Patient will be discharged back to ALF today. Type of Study: Bedside Swallow Evaluation Previous Swallow Assessment: none Diet Prior to this Study: Regular;Thin liquids Temperature Spikes Noted: No Respiratory Status: Room air History of Recent Intubation: No Behavior/Cognition: Alert;Cooperative;Pleasant mood Oral Cavity Assessment: Within Functional Limits Oral Care Completed by SLP: Recent completion by staff Oral Cavity - Dentition: Edentulous Vision: Functional for self-feeding Self-Feeding Abilities: Able to feed self Patient Positioning: Upright in bed Baseline Vocal Quality: Normal Volitional Cough: Strong Volitional Swallow: Able to elicit    Oral/Motor/Sensory Function Overall Oral Motor/Sensory Function: Mild impairment Facial ROM: Reduced left(unknown origin) Facial Symmetry: Abnormal symmetry left Facial Strength: Within Functional Limits Facial Sensation: Within Functional Limits Lingual ROM: Within Functional Limits Lingual Symmetry: Within Functional Limits Lingual Strength: Within Functional Limits Lingual Sensation: Within Functional Limits Velum: Within Functional Limits Mandible: Within Functional Limits   Ice Chips Ice chips:  Not tested   Thin Liquid Thin Liquid: Impaired Presentation: Cup;Self Fed;Straw Pharyngeal  Phase Impairments: Cough - Immediate(with large bolus)    Nectar Thick Nectar Thick Liquid: Not tested   Honey Thick Honey Thick Liquid: Not tested   Puree Puree: Impaired Presentation: Spoon Oral Phase Functional Implications: Prolonged oral transit   Solid   GO   Solid: Impaired Presentation: Spoon Oral Phase Impairments: Impaired mastication Oral Phase Functional Implications: Prolonged oral transit;Oral residue       [employer] MA, CCC-SLP [phone]  Delle Andrzejewski Meryl 04/28/2017,11:34 AM

## 2017-04-28 NOTE — Progress Notes (Signed)
Daily Progress Note   Patient Name: Judith [name]       Date: 04/28/2017 DOB: Jan 16, 1941  Age: 77 y.o. MRN#: [id number] Attending Physician: Aline August, MD Primary Care Physician: Nolene Ebbs, MD Admit Date: 04/23/2017  Reason for Consultation/Follow-up: Establishing goals of care  Subjective: I met with Judith [name] at bedside and then in the conference room.  We discussed the excellent decisions they have made along the way for their mother.   Including no surgery in 2017 when there was the suspicion of uterine cancer and no surgery this hospital admission with regard to her osteomyelitis.  We reviewed parts of her health history.  Specifically we discussed her vascular dementia and its likely trajectory. We also discussed her brittle diabetes combined with the natural inclination not to eat that comes with dementia.  She has had multiple episodes of hypoglycemia recently.  When her dementia progresses and she no longer desires food I believe she will be at end of life.  We discussed feeding tubes.  [name] indicated that she is not inclined to place a feeding tube.   For these reasons I explained to the family that Judith [name]'s time is limited and she likely has less than 6 months.  We briefly discussed code status as well as hospice services.  I explained that in her case DNR is likely a protective measure that will allow her to receive care, but prevent her from receiving overly aggressive treatment this is unlikely to benefit her (such as chest compressions, defibrillation, intubation).  Our meeting was brief and [name] and Legrand Como needed time to process the information.  They are agreeable to hospice services at ALF.   Assessment: 77 yo female with vascular dementia, brittle  uncontrolled DM, asthma, anxiety, CKD stg 3, recurrent urinary tract infections and peripheral vascular disease s/p right leg AKA, now with chronic osteomyelitis in her left lower extremity 5th digit.  Family has opted for no surgery - only conservative measures.   Patient Profile/HPI:  77 y.o. female  with past medical history of DM with charcots foot, colitis, asthma, arthritis, anxiety, CKD 3, dementia and previous right sided AKA.   She was admitted on 04/23/2017 with altered mental status and weakness.  She was found to have a UTI, acute kidney injury on stage 3 CKD, and  osteomyelitis in the 5th digit of her left foot.  Her albumin is 2.1 - 2.4.   Length of Stay: 5  Current Medications: Scheduled Meds:  . acetaminophen  650 mg Oral Q8H  . aspirin  325 mg Oral Q breakfast  . atorvastatin  20 mg Oral QPM  . cloNIDine  0.1 mg Oral BID  . docusate sodium  100 mg Oral Q12H  . enoxaparin (LOVENOX) injection  40 mg Subcutaneous QHS  . famotidine  20 mg Oral Daily  . feeding supplement (PRO-STAT SUGAR FREE 64)  30 mL Oral BID  . insulin aspart  0-15 Units Subcutaneous TID WC  . insulin aspart  0-5 Units Subcutaneous QHS  . insulin detemir  6 Units Subcutaneous BID  . metoprolol succinate  75 mg Oral Q breakfast  . polyethylene glycol  17 g Oral Q breakfast  . vitamin C  500 mg Oral BID    Continuous Infusions:   PRN Meds: LORazepam, magnesium hydroxide, ondansetron **OR** ondansetron (ZOFRAN) IV, promethazine  Physical Exam        Well developed pleasantly demented female, awake, responsive CV tachy, Resp no distress, no w/c/r Abdomen soft nt, nd  Vital Signs: BP (!) 92/44 (BP Location: Right Arm)   Pulse (!) 101   Temp 98 F (36.7 C) (Oral)   Resp 18   Wt 78.6 kg (173 lb 4.5 oz)   SpO2 100%   BMI 28.84 kg/m  SpO2: SpO2: 100 % O2 Device: O2 Device: Room Air O2 Flow Rate:    Intake/output summary:   Intake/Output Summary (Last 24 hours) at 04/28/2017 0724 Last data filed  at 04/28/2017 7408 Gross per 24 hour  Intake 360 ml  Output 650 ml  Net -290 ml   LBM: Last BM Date: 04/26/17 Baseline Weight: Weight: 74.9 kg (165 lb 2 oz) Most recent weight: Weight: 78.6 kg (173 lb 4.5 oz)       Palliative Assessment/Data: 30%      Patient Active Problem List   Diagnosis Date Noted  . Acute UTI   . AKI (acute kidney injury) ([place])   . Osteomyelitis of left foot ([place])   . UTI (urinary tract infection) 04/23/2017  . Osteomyelitis due to type 2 diabetes mellitus ([place]) 04/23/2017  . Uterine hypertrophy 06/11/2015  . Kidney lesion, native, bilateral 06/11/2015  . Protein-calorie malnutrition, severe ([place]) 09/22/2013  . Dementia 09/21/2013  . Constipation 09/21/2013  . Rectal bleeding 09/21/2013  . Fecal impaction ([place]) 09/21/2013  . BRBPR (bright red blood per rectum) 09/21/2013  . Altered mental status 05/17/2013  . Altered mental state 05/17/2013  . Delirium due to another medical condition 02/18/2013  . Acute blood loss anemia 03/03/2012  . Hx of right BKA ([place]) 03/03/2012  . Fall 03/03/2012  . Obesity (BMI 30-39.9) 03/03/2012  . Poor nutrition 03/03/2012  . Fracture of femur, distal, right, closed ([place]) 03/01/2012  . Lower urinary tract infectious disease 03/01/2012  . Insulin dependent diabetes mellitus ([place]) 03/01/2012  . Hypertension     Palliative Care Plan    Recommendations/Plan:  Would consider decreasing insulin to some extent - allowing for slightly higher CBGs as she is having hypoglycemia and not inclined to seek food on her own.  Request feeding assistance with meals and snacks  D/C to ALF with Hospice Services - she will need Hospice for wound care as well   Code Status:  Full code family thoughtfully considering a change of code status.  Prognosis:   < 6  months secondary to recurrent UTIs, chronic osteomyelitis not on antibiotic therapy, advanced peripheral vascular disease (s/p previous AKA), vascular dementia with decreased  desire to eat and drink, brittle diabetes experiencing hypoglycemia.  Discharge Planning:  ALF with hospice  Care plan was discussed with family and bedside RN  Thank you for allowing the Palliative Medicine Team to assist in the care of this patient.  Total time spent:  40 min.     Greater than 50%  of this time was spent counseling and coordinating care related to the above assessment and plan.  [name], PA-C Palliative Medicine  Please contact Palliative MedicineTeam phone at [phone] for questions and concerns between 7 am - 7 pm.   Please see AMION for individual provider pager numbers.

## 2017-04-28 NOTE — Progress Notes (Addendum)
Spoke with son/daughter-in-law "Armella Stogner" as requested per patient at [phone], offered choices of Home Hospice Providers, selected [facility].  Referral for Home with Hospice called to liaison Erling Conte at [phone].  Home DME: manual wheelchair.   NCM called [facility] at [phone], advised Lovey Newcomer patient will be discharging today and need to know who to speak with regarding transportation; per son Nanine Means should provide transportation. Spoke with [facility] Director advised that patient has discharge orders for today. States she will need to see discharge plan because last she heard they were doing surgery on patient she is not sure that she will be accepted back. NCM faxed discharge summary and most recent orthopedic note to [place]; received successful confirmation received.  Kristen Cardinal, BSN, RN Nurse case [place]

## 2017-04-28 NOTE — NC FL2 (Signed)
[place] LEVEL OF CARE SCREENING TOOL     IDENTIFICATION  Patient Name: Judith [name] Birthdate: 1940/04/22 Sex: female Admission Date (Current Location): 04/23/2017  [place] and [place] Number:  Kathleen Argue [id number] [place] and Address:  [facility] [place]. [facility] [place], [place] [address], [place], [place] [address]      Provider Number: [id number]  Attending Physician Name and Address:  Aline August, MD  Relative Name and Phone Number:  Kewanna Kasprzak - daughter-in-law - [phone]; Legrand Como Szafran-son-[phone]    Current Level of Care: Hospital Recommended Level of Care: [place] Prior Approval Number:    Date Approved/Denied:   PASRR Number: [id number](Eff. 04/28/17)  Discharge Plan: SNF    Current Diagnoses: Patient Active Problem List   Diagnosis Date Noted  . Encounter for hospice care discussion   . Palliative care encounter   . Acute UTI   . AKI (acute kidney injury) ([place])   . Osteomyelitis of left foot ([place])   . UTI (urinary tract infection) 04/23/2017  . Osteomyelitis due to type 2 diabetes mellitus ([place]) 04/23/2017  . Uterine hypertrophy 06/11/2015  . Kidney lesion, native, bilateral 06/11/2015  . Protein-calorie malnutrition, severe ([place]) 09/22/2013  . Dementia 09/21/2013  . Constipation 09/21/2013  . Rectal bleeding 09/21/2013  . Fecal impaction ([place]) 09/21/2013  . BRBPR (bright red blood per rectum) 09/21/2013  . Altered mental status 05/17/2013  . Altered mental state 05/17/2013  . Delirium due to another medical condition 02/18/2013  . Acute blood loss anemia 03/03/2012  . Hx of right BKA ([place]) 03/03/2012  . Fall 03/03/2012  . Obesity (BMI 30-39.9) 03/03/2012  . Poor nutrition 03/03/2012  . Fracture of femur, distal, right, closed ([place]) 03/01/2012  . Lower urinary tract infectious disease 03/01/2012  . Insulin dependent diabetes mellitus ([place]) 03/01/2012  . Hypertension     Orientation RESPIRATION BLADDER  Height & Weight     Self  Normal Incontinent, External catheter(Cath placed 04/25/17) Weight: 173 lb 4.5 oz (78.6 kg) Height:     BEHAVIORAL SYMPTOMS/MOOD NEUROLOGICAL BOWEL NUTRITION STATUS        Diet(Heart healthy/carb modified)  AMBULATORY STATUS COMMUNICATION OF NEEDS Skin   Total Care(Patient has right BKA) Verbally Other (Comment)(Stage 2 pressure injury to foot)                       Personal Care Assistance Level of Assistance  Bathing, Feeding, Dressing Bathing Assistance: Maximum assistance Feeding assistance: Independent Dressing Assistance: Maximum assistance     Functional Limitations Info  Sight, Hearing, Speech Sight Info: Adequate Hearing Info: Impaired(Patient HOH) Speech Info: Adequate    SPECIAL CARE FACTORS FREQUENCY  PT (By licensed PT), OT (By licensed OT), Speech therapy     PT Frequency: Evaluated 3/31 OT Frequency: Evaluated 4/1     Speech Therapy Frequency: Evaluated 4/3 - Swallow eval and DYS 1 diet recommended      Contractures Contractures Info: Not present    Additional Factors Info  Code Status, Allergies Code Status Info: Full Allergies Info: No known allergies   Insulin Sliding Scale Info: 0-15 Units 3X per day with meals; 0-5 Units daily at bedtime       Current Medications (04/28/2017):  This is the current hospital active medication list Current Facility-Administered Medications  Medication Dose Route Frequency Provider Last Rate Last Dose  . acetaminophen (TYLENOL) tablet 650 mg  650 mg Oral [address] [place], [place]   650 mg at 04/28/17 0541  .  aspirin tablet 325 mg  325 mg Oral Q breakfast Raiford Noble [place], [place]   325 mg at 04/28/17 1791  . atorvastatin (LIPITOR) tablet 20 mg  20 mg Oral QPM Sheikh, Georgina Quint [place], DO   20 mg at 04/27/17 1743  . cloNIDine (CATAPRES) tablet 0.1 mg  0.1 mg Oral BID Raiford Noble [place], DO   Stopped at 04/28/17 5056  . docusate sodium (COLACE) capsule 100 mg  100 mg Oral Q12H Sheikh, Georgina Quint  [place], DO   100 mg at 04/28/17 9794  . enoxaparin (LOVENOX) injection 40 mg  40 mg Subcutaneous QHS Raiford Noble [place], DO   40 mg at 04/27/17 2112  . famotidine (PEPCID) tablet 20 mg  20 mg Oral Daily Raiford Noble [place], DO   20 mg at 04/28/17 0859  . feeding supplement (PRO-STAT SUGAR FREE 64) liquid 30 mL  30 mL Oral BID Sheikh, Omair Latif, DO   30 mL at 04/28/17 0900  . insulin aspart (novoLOG) injection 0-15 Units  0-15 Units Subcutaneous TID WC Sheikh, Georgina Quint [place], DO   3 Units at 04/28/17 0900  . insulin aspart (novoLOG) injection 0-5 Units  0-5 Units Subcutaneous QHS Raiford Noble [place], [place]   2 Units at 04/23/17 2239  . insulin detemir (LEVEMIR) injection 6 Units  6 Units Subcutaneous Daily Aline August, MD   6 Units at 04/28/17 1123  . LORazepam (ATIVAN) tablet 0.5 mg  0.5 mg Oral Q6H PRN Raiford Noble Latif, DO   0.5 mg at 04/26/17 2332  . magnesium hydroxide (MILK OF MAGNESIA) suspension 30 mL  30 mL Oral Daily PRN Raiford Noble Latif, DO      . metoprolol succinate (TOPROL-XL) 24 hr tablet 75 mg  75 mg Oral Q breakfast Raiford Noble [place], DO   75 mg at 04/28/17 0859  . ondansetron (ZOFRAN) tablet 4 mg  4 mg Oral Q6H PRN Raiford Noble Latif, DO       Or  . ondansetron [facility]) injection 4 mg  4 mg Intravenous Q6H PRN Sheikh, Omair Latif, DO      . polyethylene glycol (MIRALAX / GLYCOLAX) packet 17 g  17 g Oral Q breakfast Raiford Noble [place], DO   17 g at 04/28/17 0859  . promethazine (PHENERGAN) tablet 25 mg  25 mg Oral Q6H PRN Sheikh, Georgina Quint Latif, DO      . vitamin C (ASCORBIC ACID) tablet 500 mg  500 mg Oral BID Raiford Noble [place], DO   500 mg at 04/28/17 8016     Discharge Medications: Please see discharge summary for a list of discharge medications.  Relevant Imaging Results:  Relevant Lab Results:   Additional Information [phone]  Sable Feil, LCSW

## 2017-04-28 NOTE — Progress Notes (Signed)
Report called to [place], Therapist, sports at [employer]. Transport called.

## 2017-04-28 NOTE — Progress Notes (Signed)
Inpatient Diabetes Program Recommendations  AACE/ADA: New Consensus Statement on Inpatient Glycemic Control (2015)  Target Ranges:  Prepandial:   less than 140 mg/dL      Peak postprandial:   less than 180 mg/dL (1-2 hours)      Critically ill patients:  140 - 180 mg/dL   Results for TOBIN, CADIENTE (MRN [id number]) as of 04/28/2017 08:49  Ref. Range 04/27/2017 07:43 04/27/2017 11:40 04/27/2017 16:38 04/27/2017 21:00 04/27/2017 21:38 04/27/2017 22:09 04/28/2017 02:36 04/28/2017 08:00  Glucose-Capillary Latest Ref Range: 65 - 99 mg/dL 112 (H)  Levemir 6 units @ 9:27 132 (H)  Novolog 2 units @12 :17 85  Novolog 2 units @ 17:42 50 (L) 55 (L) 145 (H) 114 (H) 174 (H)   Review of Glycemic Control  Diabetes history: DM2 Outpatient Diabetes medications: Levemir 5 units daily, Novolog correction tid with meals Current orders for Inpatient glycemic control: Novolog 0-15 units TID with meals, Novolog 0-5 units QHS  Inpatient Diabetes Program Recommendations:  Insulin-Basal: Noted Levemir 6 units BID was discontinued. Patient received Levemir 6 units once on 04/27/17 at 9:27 am. Hypoglycemic episode last night was not from basal insulin but instead from getting Novolog 2 units for correction when glucose was 85 mg/dl. Please order Levemir 6 units daily (to start now). Correction (SSI): Noted glucose was 85 mg/dl at 16:38 and patient was given Novolog 2 units at 17:42 which lead to hypoglycemic episode with glucose of 50 mg/dl at 21:00 on 04/27/17. Patient should NOT have been given any Novolog correction for glucose of 85 mg/dl yesterday.  Thanks, Barnie Alderman, RN, MSN, CDE Diabetes Coordinator Inpatient Diabetes Program [phone] (Team Pager from 8am to 5pm)

## 2017-04-28 NOTE — Discharge Summary (Signed)
Physician Discharge Summary  Judith [name] GXQ:[id number] DOB: [birth year] DOA: 04/23/2017  PCP: Nolene Ebbs, MD  Admit date: 04/23/2017 Discharge date: 04/28/2017  Admitted From: ALF Disposition:  ALF  Recommendations for Outpatient Follow-up:  1. Follow up with PCP in 1 week 2. Follow-up with orthopedic/Dr. Sharol Given as an outpatient 3. Patient will benefit from outpatient follow-up with hospice at the ALF 4. Fall precautions   Home Health: No Equipment/Devices: None  Discharge Condition: Poor CODE STATUS: Full  Diet recommendation: Heart Healthy / Carb Modified /diet as per SLP recommendations  Brief/Interim Summary: 77 year old female with history of dementia, GERD, insulin-dependent diabetes mellitus, hyperlipidemia, hypertension, asthma who was recently seen in the ED on 04/21/2017 and diagnosed with a UTI and placed on Keflex and sent back to the facility.  2 weeks ago she had an MRI of her left foot with confirmed osteomyelitis and was supposed to follow-up with orthopedic/Dr. Sharol Given but never made it.  Patient presented on 04/23/2017 with altered mental status and was subsequently admitted for left foot osteomyelitis and UTI and started on antibiotics.  Orthopedics was consulted who recommended transfer to [facility] for further evaluation and treatment.  After evaluation by Dr. Sharol Given and is discussing with the family, family has opted for nonsurgical treatment. Dr. Sharol Given recommended discontinuing antibiotics.  Patient was also evaluated by palliative care and recommended hospice evaluation at assisted living facility.  Patient will be discharged back to ALF today.    Discharge Diagnoses:  Principal Problem:   Osteomyelitis due to type 2 diabetes mellitus ([place]) Active Problems:   Lower urinary tract infectious disease   Hypertension   Insulin dependent diabetes mellitus (HCC)   Hx of right BKA (HCC)   Altered mental status   Dementia   UTI (urinary tract infection)    Osteomyelitis of left foot ([place])   Acute UTI   AKI (acute kidney injury) ([place])   Encounter for hospice care discussion   Palliative care encounter  Osteomyelitis of Left Foot with unstageable ulcer present on admission -MRI 04/09/17 showedSkin ulceration lateral to the fifth MTP joint with findings consistent with osteomyelitis throughout the proximal phalanx of the fifth toe and almost entire fifth metatarsal. There may be a small abscess adjacent to the head and neck of the fifth metatarsal.  -Had ABI as an outpatient but unable to view report  -Dr. Sharol Given has seen the patient and after his discussion with family, the family has decided for medical treatment and no surgery at this time. -Patient was initially started on broad-spectrum intravenous antibiotics.  Cultures have been negative so far. -Dr. Sharol Given recommended discontinuing antibiotics; hence antibiotics were discontinued -Discharge back to assisted living facility  Question of choking of food -Nurse reported that patient was choking on food.    Diet will be as per SLP evaluation  Acute Klebsiella Pneumoniae UTI -Patient received 5 days of antibiotics including cefepime.    Antibiotics have been discontinued. Currently afebrile.    AKI on CKD Stage 3 with metabolic acidosis -Improved.  Off   Bicarb drip  -Renal function stable. Outpatient follow-up  Hypernatremia/Hyperchloremia -Improved.  Altered mental status in a patient with history of dementia -Probably in the setting of infection. -Head CT showed no evidence of acute intracranial abnormality. Mild chronic small vessel ischemic disease and cerebral atrophy. -Mental status stable for now  Generalized deconditioning -Palliative care consultation appreciated: Patient would benefit from hospice evaluation at assisted living facility.  For now patient remains full code and family should  continue discussions regarding changing her CODE STATUS to DNR   Insulin  Dependent Diabetes Mellitus with hyperglycemia -Hemoglobin A1c 8.2.   - blood sugars stable.  Continue Levemir and Accu-Cheks with coverage.  Hyperlipidemia -Continue atorvastatin  Hypertension -Blood pressure stable.  Continue metoprolol.  Lisinopril is on hold.  Consider holding clonidine at ALF if blood pressure remains low.  GERD -Continue famotidine  Hypomagnesemia -Improved  Dementia -Monitor mental status. -Outpatient follow-up   Discharge Instructions  Discharge Instructions    Ambulatory referral to Hospice   Complete by:  As directed    Call MD for:  difficulty breathing, headache or visual disturbances   Complete by:  As directed    Call MD for:  extreme fatigue   Complete by:  As directed    Call MD for:  hives   Complete by:  As directed    Call MD for:  persistant dizziness or light-headedness   Complete by:  As directed    Call MD for:  persistant nausea and vomiting   Complete by:  As directed    Call MD for:  severe uncontrolled pain   Complete by:  As directed    Call MD for:  temperature >100.4   Complete by:  As directed    Diet - low sodium heart healthy   Complete by:  As directed    Diet Carb Modified   Complete by:  As directed    Discharge instructions   Complete by:  As directed    Fall precautions Diet as per SLP recommendations   Increase activity slowly   Complete by:  As directed      Allergies as of 04/28/2017   No Known Allergies     Medication List    STOP taking these medications   cephALEXin 500 MG capsule Commonly known as:  KEFLEX   chlorhexidine 0.12 % solution Commonly known as:  PERIDEX   docusate sodium 100 MG capsule Commonly known as:  COLACE   hydrocerin Crea   hydrocortisone 2.5 % rectal cream Commonly known as:  ANUSOL-HC   HYDROCORTISONE MAX ST 1 % Generic drug:  hydrocortisone cream   lisinopril 5 MG tablet Commonly known as:  PRINIVIL,ZESTRIL     TAKE these medications   acetaminophen  650 MG CR tablet Commonly known as:  TYLENOL Take 650 mg by mouth every 4 (four) hours as needed for pain.   aspirin 325 MG tablet Take 325 mg by mouth daily with breakfast.   atorvastatin 20 MG tablet Commonly known as:  LIPITOR Take 20 mg by mouth every evening.   cloNIDine 0.1 MG tablet Commonly known as:  CATAPRES Take 0.1 mg by mouth 2 (two) times daily.   feeding supplement (PRO-STAT SUGAR FREE 64) Liqd Take 30 mLs by mouth 2 (two) times daily.   INSTA-GLUCOSE 77.4 % Gel Take 1 vial by mouth daily as needed (Diabetes, Give when CBG is <60).   insulin aspart 100 UNIT/ML injection Commonly known as:  novoLOG Inject 0-15 Units into the skin 3 (three) times daily before meals. Per sliding scale   0-150 = 0 units 151-200 = 3 units 201-250 = 5 units 251-300 = 7 units 301-350 = 9 units 351-400 = 11 units 401-450 = 13 units 451-500 = 15 units and call provider   insulin detemir 100 UNIT/ML injection Commonly known as:  LEVEMIR Inject 0.06 mLs (6 Units total) into the skin daily. What changed:    how much to take  when  to take this   loperamide 2 MG capsule Commonly known as:  IMODIUM Take 2-4 mg by mouth as needed for diarrhea or loose stools.   LORazepam 0.5 MG tablet Commonly known as:  ATIVAN Take 1 tablet (0.5 mg total) by mouth every 6 (six) hours as needed for anxiety.   magnesium hydroxide 400 MG/5ML suspension Commonly known as:  MILK OF MAGNESIA Take 30 mLs by mouth daily as needed for mild constipation.   megestrol 400 MG/10ML suspension Commonly known as:  MEGACE Take 800 mg by mouth daily.   metoprolol succinate 50 MG 24 hr tablet Commonly known as:  TOPROL-XL Take 75 mg by mouth daily with breakfast. Take with or immediately following a meal.   mirtazapine 7.5 MG tablet Commonly known as:  REMERON Take 7.5 mg by mouth every other day.   polyethylene glycol packet Commonly known as:  MIRALAX / GLYCOLAX Take 17 g by mouth daily. What  changed:  when to take this   promethazine 25 MG tablet Commonly known as:  PHENERGAN Take 25 mg by mouth every 6 (six) hours as needed for nausea or vomiting.   ranitidine 150 MG tablet Commonly known as:  ZANTAC Take 150 mg by mouth every evening.   vitamin C 500 MG tablet Commonly known as:  ASCORBIC ACID Take 500 mg by mouth 2 (two) times daily.   zinc gluconate 50 MG tablet Take 50 mg by mouth daily with breakfast.       Follow-up Information    Newt Minion, MD Follow up in 2 week(s).   Specialty:  Orthopedic Surgery Contact information: [place] [address] [phone]        Nolene Ebbs, MD. Schedule an appointment as soon as possible for a visit in 1 week(s).   Specialty:  Internal Medicine Contact information: [address] [place] [address] [phone]        Hospice. Schedule an appointment as soon as possible for a visit.   Why:  At earliest convenience         No Known Allergies  Consultations:  Palliative care  Orthopedic   Procedures/Studies: Dg Chest 2 View  Result Date: 04/23/2017 CLINICAL DATA:  Altered mental status EXAM: CHEST - 2 VIEW COMPARISON:  04/19/2016 FINDINGS: No acute pulmonary infiltrate or effusion. Stable borderline cardiomegaly with aortic atherosclerosis. No pneumothorax. Probable scarring at the left lung base. IMPRESSION: No active cardiopulmonary disease. Probable scarring at the left lung base. Electronically Signed   By: Donavan Foil M.D.   On: 04/23/2017 14:56   Ct Head Wo Contrast  Result Date: 04/23/2017 CLINICAL DATA:  Altered mental status. EXAM: CT HEAD WITHOUT CONTRAST TECHNIQUE: Contiguous axial images were obtained from the base of the skull through the vertex without intravenous contrast. COMPARISON:  05/17/2013 FINDINGS: Brain: There is no evidence of acute infarct, intracranial hemorrhage, mass, midline shift, or extra-axial fluid collection. 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 There may be a tiny right cerebellar infarct as well. Periventricular white matter hypodensities are similar to the prior study and nonspecific but compatible with mild chronic small vessel ischemic disease. 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 Mild prominence of the ventricles is favored to reflect mildly progressive cerebral atrophy rather than hydrocephalus. Vascular: Mild calcified atherosclerosis at the skull base. No hyperdense vessel. Skull: No fracture or suspicious osseous lesion. Sinuses/Orbits: Visualized paranasal sinuses and mastoid air cells are clear. Bilateral cataract extraction. Other: None. IMPRESSION:  1. No evidence of acute intracranial abnormality. 2. Mild chronic small vessel ischemic disease and cerebral atrophy. Electronically Signed   By: Logan Bores M.D.   On: 04/23/2017 16:26   Mr Foot Left W Wo Contrast  Result Date: 04/09/2017 CLINICAL DATA:  Nonhealing wound on the left fifth toe in a diabetic patient. Question osteomyelitis. EXAM: MRI OF THE LEFT FOREFOOT WITHOUT AND WITH CONTRAST TECHNIQUE: Multiplanar, multisequence MR imaging of the left forefoot was performed both before and after administration of intravenous contrast. CONTRAST:  7 ml MULTIHANCE GADOBENATE DIMEGLUMINE 529 MG/ML IV SOLN COMPARISON:  None. FINDINGS: Bones/Joint/Cartilage There is marrow edema and postcontrast enhancement throughout almost the entire fifth metatarsal with only the approximately the proximal 0.5 cm spared. Edema and enhancement are also seen throughout the proximal phalanx of the fifth toe. There is bony destructive change about the fifth MTP joint. Ligaments Intact. Muscles and Tendons Complete fatty replacement of all intrinsic musculature is seen. No intramuscular fluid collection. Soft tissues Skin ulceration along the fifth MTP joint may be packed. Alternatively, there is a small abscess measuring 1 cm craniocaudal by 0.8 cm transverse by 1  cm long at the level of the neck of the metatarsal. IMPRESSION: Skin ulceration lateral to the fifth MTP joint with findings consistent with osteomyelitis throughout the proximal phalanx of the fifth toe and almost entire fifth metatarsal. There may be a small abscess adjacent to the head and neck of the fifth metatarsal. No other acute abnormality. Electronically Signed   By: Inge Rise M.D.   On: 04/09/2017 10:13      Subjective: Patient seen and examined at bedside.  She is awake but confused.  No overnight fever or vomiting. Discharge Exam: Vitals:   04/28/17 0857 04/28/17 0946  BP: (!) 117/48 108/69  Pulse: 83 89  Resp:  18  Temp:  98.1 F (36.7 C)  SpO2:  100%   Vitals:   04/27/17 2105 04/28/17 0623 04/28/17 0857 04/28/17 0946  BP: (!) 97/41 (!) 92/44 (!) 117/48 108/69  Pulse: 79 (!) 101 83 89  Resp: 18 18  18   Temp: 98.9 F (37.2 C) 98 F (36.7 C)  98.1 F (36.7 C)  TempSrc: Oral Oral  Oral  SpO2: 98% 100%  100%  Weight:        General: Pt is awake but confused.  No distress Cardiovascular: Rate controlled, S1/S2 + Respiratory: Bilateral decreased breath sounds at bases  abdominal: Soft, NT, ND, bowel sounds + Extremities: Right BKA.  Left foot dressing present   The results of significant diagnostics from this hospitalization (including imaging, microbiology, ancillary and laboratory) are listed below for reference.     Microbiology: Recent Results (from the past 240 hour(s))  Urine culture     Status: Abnormal   Collection Time: 04/21/17  5:35 PM  Result Value Ref Range Status   Specimen Description   Final    URINE, RANDOM Performed at [place] [address] [place], [place] [address]    Special Requests   Final    NONE Performed at [facility], [place] [address], [place], [place] [address]    Culture (A)  Final    >=100,000 COLONIES/mL KLEBSIELLA PNEUMONIAE 30,000 COLONIES/mL ESCHERICHIA COLI     Report Status 04/25/2017 FINAL  Final   Organism ID, Bacteria KLEBSIELLA PNEUMONIAE (A)  Final   Organism ID, Bacteria ESCHERICHIA COLI (A)  Final      Susceptibility   Escherichia coli - MIC*  AMPICILLIN 4 SENSITIVE Sensitive     CEFAZOLIN <=4 SENSITIVE Sensitive     CEFTRIAXONE <=1 SENSITIVE Sensitive     CIPROFLOXACIN <=0.25 SENSITIVE Sensitive     GENTAMICIN <=1 SENSITIVE Sensitive     IMIPENEM <=0.25 SENSITIVE Sensitive     NITROFURANTOIN <=16 SENSITIVE Sensitive     TRIMETH/SULFA <=20 SENSITIVE Sensitive     AMPICILLIN/SULBACTAM <=2 SENSITIVE Sensitive     PIP/TAZO <=4 SENSITIVE Sensitive     Extended ESBL NEGATIVE Sensitive     * 30,000 COLONIES/mL ESCHERICHIA COLI   Klebsiella pneumoniae - MIC*    AMPICILLIN >=32 RESISTANT Resistant     CEFAZOLIN <=4 SENSITIVE Sensitive     CEFTRIAXONE <=1 SENSITIVE Sensitive     CIPROFLOXACIN <=0.25 SENSITIVE Sensitive     GENTAMICIN <=1 SENSITIVE Sensitive     IMIPENEM <=0.25 SENSITIVE Sensitive     NITROFURANTOIN 64 INTERMEDIATE Intermediate     TRIMETH/SULFA <=20 SENSITIVE Sensitive     AMPICILLIN/SULBACTAM 4 SENSITIVE Sensitive     PIP/TAZO <=4 SENSITIVE Sensitive     Extended ESBL NEGATIVE Sensitive     * >=100,000 COLONIES/mL KLEBSIELLA PNEUMONIAE  Urine culture     Status: Abnormal   Collection Time: 04/23/17  1:08 PM  Result Value Ref Range Status   Specimen Description   Final    URINE, CATHETERIZED Performed at [facility], [place] [address], [place], [place] [address]    Special Requests   Final    NONE Performed at [facility], [place] [address] [place], [place] [address]    Culture 30,000 COLONIES/mL KLEBSIELLA PNEUMONIAE (A)  Final   Report Status 04/26/2017 FINAL  Final   Organism ID, Bacteria KLEBSIELLA PNEUMONIAE (A)  Final      Susceptibility   Klebsiella pneumoniae - MIC*    AMPICILLIN >=32 RESISTANT Resistant     CEFAZOLIN <=4 SENSITIVE Sensitive     CEFTRIAXONE <=1  SENSITIVE Sensitive     CIPROFLOXACIN <=0.25 SENSITIVE Sensitive     GENTAMICIN <=1 SENSITIVE Sensitive     IMIPENEM <=0.25 SENSITIVE Sensitive     NITROFURANTOIN 64 INTERMEDIATE Intermediate     TRIMETH/SULFA <=20 SENSITIVE Sensitive     AMPICILLIN/SULBACTAM 4 SENSITIVE Sensitive     PIP/TAZO <=4 SENSITIVE Sensitive     Extended ESBL NEGATIVE Sensitive     * 30,000 COLONIES/mL KLEBSIELLA PNEUMONIAE  Culture, blood (routine x 2)     Status: None (Preliminary result)   Collection Time: 04/24/17 11:28 AM  Result Value Ref Range Status   Specimen Description   Final    BLOOD RIGHT HAND Performed at [place] [address] [place], [place] [address]    Special Requests   Final    BOTTLES DRAWN AEROBIC AND ANAEROBIC Blood Culture adequate volume Performed at [place] [address] [place], [place] [address]    Culture   Final    NO GROWTH 3 DAYS Performed at [facility], [name] [address], [place], [place] [address]    Report Status PENDING  Incomplete  Culture, blood (routine x 2)     Status: None (Preliminary result)   Collection Time: 04/24/17 11:30 AM  Result Value Ref Range Status   Specimen Description   Final    BLOOD RIGHT HAND Performed at [place] [address], [place], [place] [address]    Special Requests   Final    BOTTLES DRAWN AEROBIC ONLY Blood Culture adequate volume Performed  at [facility], [place] [address], [place], [place] [address]    Culture   Final    NO GROWTH 3 DAYS Performed at [facility], [place] [address] [place], [place] [address]    Report Status PENDING  Incomplete  MRSA PCR Screening     Status: None   Collection Time: 04/24/17 10:38 PM  Result Value Ref Range Status   MRSA by PCR NEGATIVE NEGATIVE Final    Comment:        The GeneXpert MRSA Assay (FDA approved for NASAL specimens only), is one component of a comprehensive MRSA  colonization surveillance program. It is not intended to diagnose MRSA infection nor to guide or monitor treatment for MRSA infections. Performed at [facility], [place] [address] [place], [place] [address]      Labs: BNP (last 3 results) No results for input(s): BNP in the last 8760 hours. Basic Metabolic Panel: Recent Labs  Lab 04/24/17 0631 04/24/17 1500 04/25/17 0521 04/26/17 1132 04/27/17 0806 04/28/17 0745  NA 151* 145 136 138 138 137  K 4.1 3.9 3.9 4.1 3.6 3.6  CL 122* 118* 112* 109 108 108  CO2 18* 16* 17* 19* 23 19*  GLUCOSE 271* 259* 247* 137* 124* 162*  BUN 46* 47* 34* 25* 23* 31*  CREATININE 1.36* 1.40* 1.10* 0.87 1.09* 1.03*  CALCIUM 8.8* 8.5* 8.1* 8.5* 8.1* 8.1*  MG 1.6*  --  1.9 1.8 1.8 1.9  PHOS 3.4  --  2.2*  --   --   --    Liver Function Tests: Recent Labs  Lab 04/24/17 0631 04/25/17 0521 04/26/17 1132  AST 11* 12* 15  ALT 9* 7* 7*  ALKPHOS 60 50 57  BILITOT 0.4 0.6 0.7  PROT 6.3* 4.9* 5.5*  ALBUMIN 2.6* 2.1* 2.4*   No results for input(s): LIPASE, AMYLASE in the last 168 hours. No results for input(s): AMMONIA in the last 168 hours. CBC: Recent Labs  Lab 04/23/17 1338 04/24/17 0631 04/25/17 0521 04/26/17 1132 04/27/17 0806 04/28/17 0745  WBC 12.7* 9.9 7.9 8.0 6.6 6.5  NEUTROABS 10.7*  --  5.4 5.2 4.0 4.0  HGB 11.9* 10.4* 9.1* 10.6* 9.7* 10.4*  HCT 36.5 33.1* 29.0* 33.8* 30.6* 32.7*  MCV 83.1 83.8 81.7 82.2 81.0 82.2  PLT 305 296 248 287 251 256   Cardiac Enzymes: No results for input(s): CKTOTAL, CKMB, CKMBINDEX, TROPONINI in the last 168 hours. BNP: Invalid input(s): POCBNP CBG: Recent Labs  Lab 04/27/17 2100 04/27/17 2138 04/27/17 2209 04/28/17 0236 04/28/17 0800  GLUCAP 50* 55* 145* 114* 174*   D-Dimer No results for input(s): DDIMER in the last 72 hours. Hgb A1c No results for input(s): HGBA1C in the last 72 hours. Lipid Profile No results for input(s): CHOL, HDL, LDLCALC, TRIG, CHOLHDL, LDLDIRECT in the  last 72 hours. Thyroid function studies No results for input(s): TSH, T4TOTAL, T3FREE, THYROIDAB in the last 72 hours.  Invalid input(s): FREET3 Anemia work up No results for input(s): VITAMINB12, FOLATE, FERRITIN, TIBC, IRON, RETICCTPCT in the last 72 hours. Urinalysis    Component Value Date/Time   COLORURINE YELLOW 04/23/2017 1308   APPEARANCEUR HAZY (A) 04/23/2017 1308   LABSPEC 1.016 04/23/2017 1308   PHURINE 5.0 04/23/2017 1308   GLUCOSEU NEGATIVE 04/23/2017 1308   HGBUR NEGATIVE 04/23/2017 1308   BILIRUBINUR NEGATIVE 04/23/2017 1308   KETONESUR NEGATIVE 04/23/2017 1308   PROTEINUR NEGATIVE 04/23/2017 1308   UROBILINOGEN 0.2 10/17/2014 0849   NITRITE NEGATIVE  04/23/2017 1308   LEUKOCYTESUR LARGE (A) 04/23/2017 1308   Sepsis Labs Invalid input(s): PROCALCITONIN,  WBC,  LACTICIDVEN Microbiology Recent Results (from the past 240 hour(s))  Urine culture     Status: Abnormal   Collection Time: 04/21/17  5:35 PM  Result Value Ref Range Status   Specimen Description   Final    URINE, RANDOM Performed at [place] [address] [place], [place] [address]    Special Requests   Final    NONE Performed at [facility], [place] [address], [place], [place] [address]    Culture (A)  Final    >=100,000 COLONIES/mL KLEBSIELLA PNEUMONIAE 30,000 COLONIES/mL ESCHERICHIA COLI    Report Status 04/25/2017 FINAL  Final   Organism ID, Bacteria KLEBSIELLA PNEUMONIAE (A)  Final   Organism ID, Bacteria ESCHERICHIA COLI (A)  Final      Susceptibility   Escherichia coli - MIC*    AMPICILLIN 4 SENSITIVE Sensitive     CEFAZOLIN <=4 SENSITIVE Sensitive     CEFTRIAXONE <=1 SENSITIVE Sensitive     CIPROFLOXACIN <=0.25 SENSITIVE Sensitive     GENTAMICIN <=1 SENSITIVE Sensitive     IMIPENEM <=0.25 SENSITIVE Sensitive     NITROFURANTOIN <=16 SENSITIVE Sensitive     TRIMETH/SULFA <=20 SENSITIVE Sensitive     AMPICILLIN/SULBACTAM <=2 SENSITIVE Sensitive      PIP/TAZO <=4 SENSITIVE Sensitive     Extended ESBL NEGATIVE Sensitive     * 30,000 COLONIES/mL ESCHERICHIA COLI   Klebsiella pneumoniae - MIC*    AMPICILLIN >=32 RESISTANT Resistant     CEFAZOLIN <=4 SENSITIVE Sensitive     CEFTRIAXONE <=1 SENSITIVE Sensitive     CIPROFLOXACIN <=0.25 SENSITIVE Sensitive     GENTAMICIN <=1 SENSITIVE Sensitive     IMIPENEM <=0.25 SENSITIVE Sensitive     NITROFURANTOIN 64 INTERMEDIATE Intermediate     TRIMETH/SULFA <=20 SENSITIVE Sensitive     AMPICILLIN/SULBACTAM 4 SENSITIVE Sensitive     PIP/TAZO <=4 SENSITIVE Sensitive     Extended ESBL NEGATIVE Sensitive     * >=100,000 COLONIES/mL KLEBSIELLA PNEUMONIAE  Urine culture     Status: Abnormal   Collection Time: 04/23/17  1:08 PM  Result Value Ref Range Status   Specimen Description   Final    URINE, CATHETERIZED Performed at [facility], [place] [address] [place], [place] [address]    Special Requests   Final    NONE Performed at [facility], [place] [address], [place], [place] [address]    Culture 30,000 COLONIES/mL KLEBSIELLA PNEUMONIAE (A)  Final   Report Status 04/26/2017 FINAL  Final   Organism ID, Bacteria KLEBSIELLA PNEUMONIAE (A)  Final      Susceptibility   Klebsiella pneumoniae - MIC*    AMPICILLIN >=32 RESISTANT Resistant     CEFAZOLIN <=4 SENSITIVE Sensitive     CEFTRIAXONE <=1 SENSITIVE Sensitive     CIPROFLOXACIN <=0.25 SENSITIVE Sensitive     GENTAMICIN <=1 SENSITIVE Sensitive     IMIPENEM <=0.25 SENSITIVE Sensitive     NITROFURANTOIN 64 INTERMEDIATE Intermediate     TRIMETH/SULFA <=20 SENSITIVE Sensitive     AMPICILLIN/SULBACTAM 4 SENSITIVE Sensitive     PIP/TAZO <=4 SENSITIVE Sensitive     Extended ESBL NEGATIVE Sensitive     * 30,000 COLONIES/mL KLEBSIELLA PNEUMONIAE  Culture, blood (routine x 2)     Status: None (Preliminary result)   Collection Time: 04/24/17 11:28 AM  Result Value Ref Range Status   Specimen Description  Final     BLOOD RIGHT HAND Performed at [facility], [place] [address], [place], [place] [address]    Special Requests   Final    BOTTLES DRAWN AEROBIC AND ANAEROBIC Blood Culture adequate volume Performed at [place] [address], [place], [place] [address]    Culture   Final    NO GROWTH 3 DAYS Performed at [facility], [place] [address] [place], [place] [address]    Report Status PENDING  Incomplete  Culture, blood (routine x 2)     Status: None (Preliminary result)   Collection Time: 04/24/17 11:30 AM  Result Value Ref Range Status   Specimen Description   Final    BLOOD RIGHT HAND Performed at [place] [address] [place], [place] [address]    Special Requests   Final    BOTTLES DRAWN AEROBIC ONLY Blood Culture adequate volume Performed at [place] [address] [place], [place] [address]    Culture   Final    NO GROWTH 3 DAYS Performed at [facility], [place] [address] [place], [place] [address]    Report Status PENDING  Incomplete  MRSA PCR Screening     Status: None   Collection Time: 04/24/17 10:38 PM  Result Value Ref Range Status   MRSA by PCR NEGATIVE NEGATIVE Final    Comment:        The GeneXpert MRSA Assay (FDA approved for NASAL specimens only), is one component of a comprehensive MRSA colonization surveillance program. It is not intended to diagnose MRSA infection nor to guide or monitor treatment for MRSA infections. Performed at [facility], [place] [address], [place], [place] [address]      Time coordinating discharge: 35 minutes  SIGNED:   Aline August, MD  [facility] 04/28/2017, 10:13 AM Pager: [phone]  If 7PM-7AM, please contact night-coverage [email or web address] Password [email or web address]

## 2017-04-28 NOTE — Clinical Social Work Placement (Signed)
   CLINICAL SOCIAL WORK PLACEMENT  NOTE 04/28/17 - DISCHARGED TO [facility] VIA AMBULANCE  Date:  04/28/2017  Patient Details  Name: [name] MRN: [id number] Date of Birth: 02-Jun-1940  Clinical Social Work is seeking post-discharge placement for this patient at the [place] level of care (*CSW will initial, date and re-position this form in  chart as items are completed):  No(Family provided CSW with facillity preference)   Patient/family provided with [place] Work Department's list of facilities offering this level of care within the geographic area requested by the patient (or if unable, by the patient's family).  Yes   Patient/family informed of their freedom to choose among providers that offer the needed level of care, that participate in Medicare, Medicaid or managed care program needed by the patient, have an available bed and are willing to accept the patient.  No   Patient/family informed of Deputy's ownership interest in [facility] and [facility], as well as of the fact that they are under no obligation to receive care at these facilities.  PASRR submitted to EDS on 04/28/17     PASRR number received on 04/28/17([phone] [id number])     Existing PASRR number confirmed on       FL2 transmitted to all facilities in geographic area requested by pt/family on 04/28/17     FL2 transmitted to all facilities within larger geographic area on       Patient informed that his/her managed care company has contracts with or will negotiate with certain facilities, including the following:        Yes   Patient/family informed of bed offers received.  Patient chooses bed at [facility]     Physician recommends and patient chooses bed at      Patient to be transferred to [facility] on 04/28/17.  Patient to be transferred to facility by Ambulance     Patient family notified on 04/28/17 of transfer.  Name of  family member notified:  Jatavia Keltner - daughter-in-law - [phone]     PHYSICIAN       Additional Comment:    _______________________________________________ Sable Feil, LCSW 04/28/2017, 3:18 PM

## 2017-04-29 LAB — CULTURE, BLOOD (ROUTINE X 2)
CULTURE: NO GROWTH
Culture: NO GROWTH
Special Requests: ADEQUATE
Special Requests: ADEQUATE

## 2017-05-03 ENCOUNTER — Telehealth (INDEPENDENT_AMBULATORY_CARE_PROVIDER_SITE_OTHER): Payer: Self-pay | Admitting: Orthopedic Surgery

## 2017-05-03 NOTE — Telephone Encounter (Signed)
Dry dressing change daily 

## 2017-05-03 NOTE — Telephone Encounter (Signed)
Called and sw pt's daughter to advise. She states that she wanted her mother to see Dr. Sharol Given. There has not been an appt sch. She states that she will be out of town this week and wanted something for next week. Advised 05/13/17 at 12:30. Will call with any questions.

## 2017-05-03 NOTE — Telephone Encounter (Signed)
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 Do you want me to call and make appt? What dressing orders  for SNF?

## 2017-05-03 NOTE — Telephone Encounter (Signed)
Patient daughter called having some questions about the facility her mother is at and would like to speak with you. CB # [phone]

## 2017-05-03 NOTE — Telephone Encounter (Signed)
Daughter called back wanting to know if the current facility [facility]) will be taking care of her wound? CB # [phone]

## 2017-05-03 NOTE — Telephone Encounter (Signed)
Called and lm on vm to advise to call the office and leave a detailed message and I will relay to Dr. Sharol Given and then call back with answers.

## 2017-05-04 ENCOUNTER — Encounter (HOSPITAL_BASED_OUTPATIENT_CLINIC_OR_DEPARTMENT_OTHER): Payer: Medicare Other

## 2017-05-06 ENCOUNTER — Encounter (HOSPITAL_COMMUNITY): Payer: Self-pay

## 2017-05-13 ENCOUNTER — Inpatient Hospital Stay (INDEPENDENT_AMBULATORY_CARE_PROVIDER_SITE_OTHER): Payer: Self-pay | Admitting: Orthopedic Surgery

## 2017-06-02 ENCOUNTER — Non-Acute Institutional Stay: Payer: Self-pay | Admitting: Hospice and Palliative Medicine

## 2017-06-02 DIAGNOSIS — Z515 Encounter for palliative care: Secondary | ICD-10-CM

## 2017-06-02 NOTE — Progress Notes (Signed)
PALLIATIVE CARE CONSULT VISIT   PATIENT NAME: Judith [name] DOB: October 24, 1940 MRN: [id number]  PRIMARY CARE PROVIDER:   Nolene Ebbs, MD  REFERRING PROVIDER:     Dr. Reesa Chew  RESPONSIBLE PARTY:   Azzie Glatter - [phone], [name] [phone]   RECOMMENDATIONS and PLAN:  1. Weakness - working with PT. She appears mostly at baseline. Has had episodes of UTIs while at rehab.  2. Nutrition - oral intake has reportedly improved recently after modification of diet consistency.  3. GOC - recent family discussion and HCPOA, [name], had opted to continue treatment plan with supportive care and treatment. I had addressed code status and family were considering options.   I spent 10 minutes providing this consultation,  from 1000 to 1010. More than 50% of the time in this consultation was spent coordinating communication.   HISTORY OF PRESENT ILLNESS:  Judith [name] is 77 yo woman with multiple medical problems including dementia, IDDM, HTN, HLD, recent osteomyelitis of the foot, and asthma, who was recently hospitalized 04/23/17 to 04/28/17 with AMS from UTI and osteomyelitis. Patient was evaluated by ortho who recommended conservative management. Patient was also seen by palliative care and antibiotics were stopped and plan was for her to discharge back to ALF with hospice. However, patient ultimately discharged to rehab and palliative care has been asked to follow.    CODE STATUS: FC  PPS: 40% HOSPICE ELIGIBILITY/DIAGNOSIS: TBD  PAST MEDICAL HISTORY:  Past Medical History:  Diagnosis Date  . Anxiety   . Arthritis    "left leg" (05/17/2013)  . Asthma   . 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 Closed right ankle fracture   . Colitis 09/06/02  . Diabetes mellitus without complication ([place])   . Fracture of femur, distal, right, closed ([place]) 03/01/2012  . GERD (gastroesophageal reflux disease)   . PRFFMBWG(665.9)    "probably weekly" (05/17/2013)  . Hemorrhoid   . High cholesterol   .  History of stomach ulcers   . Hyperlipidemia   . Hypertension   . Insulin dependent diabetes mellitus ([place]) 03/01/2012  . Obesity (BMI 30-39.9) 03/03/2012  . Pneumonia    "once" (05/17/2013)  . Proctocolitis 09/04/02    SOCIAL HX:  Social History   Tobacco Use  . Smoking status: Former Smoker    Packs/day: 1.00    Years: 5.00    Pack years: 5.00    Last attempt to quit: 01/26/1993    Years since quitting: 24.3  . Smokeless tobacco: Former Systems developer  . Tobacco comment: "quit smoking at ~ age 21"  Substance Use Topics  . Alcohol use: No    ALLERGIES: No Known Allergies   PERTINENT MEDICATIONS:  Outpatient Encounter Medications as of 06/02/2017  Medication Sig  . acetaminophen (TYLENOL) 650 MG CR tablet Take 650 mg by mouth every 4 (four) hours as needed for pain.  . Amino Acids-Protein Hydrolys (FEEDING SUPPLEMENT, PRO-STAT SUGAR FREE 64,) LIQD Take 30 mLs by mouth 2 (two) times daily.  Marland Kitchen aspirin 325 MG tablet Take 325 mg by mouth daily with breakfast.   . atorvastatin (LIPITOR) 20 MG tablet Take 20 mg by mouth every evening.   . cloNIDine (CATAPRES) 0.1 MG tablet Take 0.1 mg by mouth 2 (two) times daily.  Marland Kitchen Dextrose, Diabetic Use, (INSTA-GLUCOSE) 77.4 % GEL Take 1 vial by mouth daily as needed (Diabetes, Give when CBG is <60).  . insulin aspart (NOVOLOG) 100 UNIT/ML injection Inject 0-15 Units into the skin  3 (three) times daily before meals. Per sliding scale   0-150 = 0 units 151-200 = 3 units 201-250 = 5 units 251-300 = 7 units 301-350 = 9 units 351-400 = 11 units 401-450 = 13 units 451-500 = 15 units and call provider  . insulin detemir (LEVEMIR) 100 UNIT/ML injection Inject 0.06 mLs (6 Units total) into the skin daily.  Marland Kitchen loperamide (IMODIUM) 2 MG capsule Take 2-4 mg by mouth as needed for diarrhea or loose stools.  Marland Kitchen LORazepam (ATIVAN) 0.5 MG tablet Take 1 tablet (0.5 mg total) by mouth every 6 (six) hours as needed for anxiety.  . magnesium hydroxide (MILK OF MAGNESIA) 400 MG/5ML  suspension Take 30 mLs by mouth daily as needed for mild constipation.  . megestrol (MEGACE) 400 MG/10ML suspension Take 800 mg by mouth daily.  . metoprolol succinate (TOPROL-XL) 50 MG 24 hr tablet Take 75 mg by mouth daily with breakfast. Take with or immediately following a meal.   . mirtazapine (REMERON) 7.5 MG tablet Take 7.5 mg by mouth every other day.  . polyethylene glycol (MIRALAX / GLYCOLAX) packet Take 17 g by mouth daily. (Patient taking differently: Take 17 g by mouth daily with breakfast. )  . promethazine (PHENERGAN) 25 MG tablet Take 25 mg by mouth every 6 (six) hours as needed for nausea or vomiting.  . ranitidine (ZANTAC) 150 MG tablet Take 150 mg by mouth every evening.   . vitamin C (ASCORBIC ACID) 500 MG tablet Take 500 mg by mouth 2 (two) times daily.  Marland Kitchen zinc gluconate 50 MG tablet Take 50 mg by mouth daily with breakfast.   No facility-administered encounter medications on file as of 06/02/2017.     PHYSICAL EXAM:   GEN: Frail appearing, thin, in bed LUNGS: CTA (ant fields) CARDIAC: RRR ABD: soft, nttp, BS + EXTREMITIES: R. BKA SKIN: wound noted on L. Heel but not visualized NEURO: weakness, alert, speech is clear and follows commands  Irean Hong, NP

## 2017-07-01 ENCOUNTER — Non-Acute Institutional Stay: Payer: Self-pay | Admitting: Hospice and Palliative Medicine

## 2017-07-01 DIAGNOSIS — Z515 Encounter for palliative care: Secondary | ICD-10-CM

## 2017-07-01 NOTE — Progress Notes (Signed)
PALLIATIVE CARE CONSULT VISIT   PATIENT NAME: Judith [name] DOB: [birth year] MRN: [id number]  PRIMARY CARE PROVIDER:   Nolene Ebbs, MD  REFERRING PROVIDER:     Dr. Reesa Chew  RESPONSIBLE PARTY:   Azzie Glatter - [phone], [name] [phone]  ASSESSMENT: Staff report that patient has been relatively stable over the past month (since she was last seen by me). Oral intake is reportedly intermittently poor. Patient has good days and bad with eating. Weight unknown. No family currently present.   I had a recent discussion with family about code status and goals and they were considering options. I attempted to call HCPOA today but did not reach her. I would recommend DNR and consideration of hospice in the event of decline.                      RECOMMENDATIONS and PLAN:  1. Continue supportive care  I spent 10 minutes providing this consultation,  from 1100 to 1110. More than 50% of the time in this consultation was spent coordinating communication.   HISTORY OF PRESENT ILLNESS:  Judith [name] is 77 yo woman with multiple medical problems including dementia, IDDM, HTN, HLD, h/o osteomyelitis of the foot, and asthma, who was last hospitalized 04/23/17 to 04/28/17 with AMS from UTI and osteomyelitis. Patient was evaluated by ortho who recommended conservative management. Patient was also seen by palliative care and antibiotics were stopped and plan was for her to discharge back to ALF with hospice. However, patient ultimately discharged to rehab and palliative care has been asked to follow.   CODE STATUS: FC  PPS: 40% HOSPICE ELIGIBILITY/DIAGNOSIS: TBD  PAST MEDICAL HISTORY:  Past Medical History:  Diagnosis Date  . Anxiety   . Arthritis    "left leg" (05/17/2013)  . Asthma   . 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 Closed right ankle fracture   . Colitis 09/06/02  . Diabetes mellitus without complication ([place])   . Fracture of femur, distal, right, closed ([place]) 03/01/2012  . GERD  (gastroesophageal reflux disease)   . TMLYYTKP(546.5)    "probably weekly" (05/17/2013)  . Hemorrhoid   . High cholesterol   . History of stomach ulcers   . Hyperlipidemia   . Hypertension   . Insulin dependent diabetes mellitus ([place]) 03/01/2012  . Obesity (BMI 30-39.9) 03/03/2012  . Pneumonia    "once" (05/17/2013)  . Proctocolitis 09/04/02    SOCIAL HX:  Social History   Tobacco Use  . Smoking status: Former Smoker    Packs/day: 1.00    Years: 5.00    Pack years: 5.00    Last attempt to quit: 01/26/1993    Years since quitting: 24.4  . Smokeless tobacco: Former Systems developer  . Tobacco comment: "quit smoking at ~ age 74"  Substance Use Topics  . Alcohol use: No    ALLERGIES: No Known Allergies   PERTINENT MEDICATIONS:  Outpatient Encounter Medications as of 07/01/2017  Medication Sig  . acetaminophen (TYLENOL) 650 MG CR tablet Take 650 mg by mouth every 4 (four) hours as needed for pain.  . Amino Acids-Protein Hydrolys (FEEDING SUPPLEMENT, PRO-STAT SUGAR FREE 64,) LIQD Take 30 mLs by mouth 2 (two) times daily.  Marland Kitchen aspirin 325 MG tablet Take 325 mg by mouth daily with breakfast.   . atorvastatin (LIPITOR) 20 MG tablet Take 20 mg by mouth every evening.   . cloNIDine (CATAPRES) 0.1 MG tablet Take 0.1 mg by mouth 2 (  two) times daily.  Marland Kitchen Dextrose, Diabetic Use, (INSTA-GLUCOSE) 77.4 % GEL Take 1 vial by mouth daily as needed (Diabetes, Give when CBG is <60).  . insulin aspart (NOVOLOG) 100 UNIT/ML injection Inject 0-15 Units into the skin 3 (three) times daily before meals. Per sliding scale   0-150 = 0 units 151-200 = 3 units 201-250 = 5 units 251-300 = 7 units 301-350 = 9 units 351-400 = 11 units 401-450 = 13 units 451-500 = 15 units and call provider  . insulin detemir (LEVEMIR) 100 UNIT/ML injection Inject 0.06 mLs (6 Units total) into the skin daily.  Marland Kitchen loperamide (IMODIUM) 2 MG capsule Take 2-4 mg by mouth as needed for diarrhea or loose stools.  Marland Kitchen LORazepam (ATIVAN) 0.5 MG tablet  Take 1 tablet (0.5 mg total) by mouth every 6 (six) hours as needed for anxiety.  . magnesium hydroxide (MILK OF MAGNESIA) 400 MG/5ML suspension Take 30 mLs by mouth daily as needed for mild constipation.  . megestrol (MEGACE) 400 MG/10ML suspension Take 800 mg by mouth daily.  . metoprolol succinate (TOPROL-XL) 50 MG 24 hr tablet Take 75 mg by mouth daily with breakfast. Take with or immediately following a meal.   . mirtazapine (REMERON) 7.5 MG tablet Take 7.5 mg by mouth every other day.  . polyethylene glycol (MIRALAX / GLYCOLAX) packet Take 17 g by mouth daily. (Patient taking differently: Take 17 g by mouth daily with breakfast. )  . promethazine (PHENERGAN) 25 MG tablet Take 25 mg by mouth every 6 (six) hours as needed for nausea or vomiting.  . ranitidine (ZANTAC) 150 MG tablet Take 150 mg by mouth every evening.   . vitamin C (ASCORBIC ACID) 500 MG tablet Take 500 mg by mouth 2 (two) times daily.  Marland Kitchen zinc gluconate 50 MG tablet Take 50 mg by mouth daily with breakfast.   No facility-administered encounter medications on file as of 07/01/2017.     PHYSICAL EXAM:   GEN: Frail appearing, thin, in bed LUNGS: CTA (ant fields) CARDIAC: RRR ABD: soft, nttp, BS + EXTREMITIES: R. BKA SKIN: exposed skin intact, wounds noted but not visualized NEURO: weakness, alert, speech is clear and follows commands  Irean Hong, NP

## 2017-08-10 ENCOUNTER — Inpatient Hospital Stay (HOSPITAL_COMMUNITY)
Admission: EM | Admit: 2017-08-10 | Discharge: 2017-09-26 | DRG: 853 | Disposition: E | Payer: Medicare Other | Source: Skilled Nursing Facility | Attending: Surgery | Admitting: Surgery

## 2017-08-10 ENCOUNTER — Emergency Department (HOSPITAL_COMMUNITY): Payer: Medicare Other

## 2017-08-10 ENCOUNTER — Emergency Department (HOSPITAL_COMMUNITY): Payer: Medicare Other | Admitting: Anesthesiology

## 2017-08-10 ENCOUNTER — Encounter (HOSPITAL_COMMUNITY): Admission: EM | Disposition: E | Payer: Self-pay | Source: Skilled Nursing Facility

## 2017-08-10 DIAGNOSIS — Z9841 Cataract extraction status, right eye: Secondary | ICD-10-CM

## 2017-08-10 DIAGNOSIS — D6959 Other secondary thrombocytopenia: Secondary | ICD-10-CM | POA: Diagnosis present

## 2017-08-10 DIAGNOSIS — Z9889 Other specified postprocedural states: Secondary | ICD-10-CM

## 2017-08-10 DIAGNOSIS — R402142 Coma scale, eyes open, spontaneous, at arrival to emergency department: Secondary | ICD-10-CM | POA: Diagnosis present

## 2017-08-10 DIAGNOSIS — K631 Perforation of intestine (nontraumatic): Secondary | ICD-10-CM | POA: Diagnosis present

## 2017-08-10 DIAGNOSIS — Z6839 Body mass index (BMI) 39.0-39.9, adult: Secondary | ICD-10-CM

## 2017-08-10 DIAGNOSIS — E871 Hypo-osmolality and hyponatremia: Secondary | ICD-10-CM | POA: Diagnosis not present

## 2017-08-10 DIAGNOSIS — I10 Essential (primary) hypertension: Secondary | ICD-10-CM | POA: Diagnosis present

## 2017-08-10 DIAGNOSIS — J45909 Unspecified asthma, uncomplicated: Secondary | ICD-10-CM | POA: Diagnosis present

## 2017-08-10 DIAGNOSIS — I129 Hypertensive chronic kidney disease with stage 1 through stage 4 chronic kidney disease, or unspecified chronic kidney disease: Secondary | ICD-10-CM | POA: Diagnosis present

## 2017-08-10 DIAGNOSIS — R1310 Dysphagia, unspecified: Secondary | ICD-10-CM | POA: Diagnosis not present

## 2017-08-10 DIAGNOSIS — E7800 Pure hypercholesterolemia, unspecified: Secondary | ICD-10-CM | POA: Diagnosis present

## 2017-08-10 DIAGNOSIS — R6521 Severe sepsis with septic shock: Secondary | ICD-10-CM | POA: Diagnosis present

## 2017-08-10 DIAGNOSIS — K9189 Other postprocedural complications and disorders of digestive system: Secondary | ICD-10-CM | POA: Diagnosis not present

## 2017-08-10 DIAGNOSIS — J95821 Acute postprocedural respiratory failure: Secondary | ICD-10-CM | POA: Diagnosis not present

## 2017-08-10 DIAGNOSIS — E43 Unspecified severe protein-calorie malnutrition: Secondary | ICD-10-CM | POA: Diagnosis present

## 2017-08-10 DIAGNOSIS — N179 Acute kidney failure, unspecified: Secondary | ICD-10-CM | POA: Diagnosis present

## 2017-08-10 DIAGNOSIS — Z794 Long term (current) use of insulin: Secondary | ICD-10-CM

## 2017-08-10 DIAGNOSIS — K659 Peritonitis, unspecified: Secondary | ICD-10-CM | POA: Diagnosis not present

## 2017-08-10 DIAGNOSIS — I480 Paroxysmal atrial fibrillation: Secondary | ICD-10-CM | POA: Diagnosis not present

## 2017-08-10 DIAGNOSIS — A419 Sepsis, unspecified organism: Principal | ICD-10-CM

## 2017-08-10 DIAGNOSIS — Z90710 Acquired absence of both cervix and uterus: Secondary | ICD-10-CM

## 2017-08-10 DIAGNOSIS — Z961 Presence of intraocular lens: Secondary | ICD-10-CM | POA: Diagnosis present

## 2017-08-10 DIAGNOSIS — Z66 Do not resuscitate: Secondary | ICD-10-CM

## 2017-08-10 DIAGNOSIS — E785 Hyperlipidemia, unspecified: Secondary | ICD-10-CM | POA: Diagnosis present

## 2017-08-10 DIAGNOSIS — Z87891 Personal history of nicotine dependence: Secondary | ICD-10-CM

## 2017-08-10 DIAGNOSIS — J9600 Acute respiratory failure, unspecified whether with hypoxia or hypercapnia: Secondary | ICD-10-CM

## 2017-08-10 DIAGNOSIS — I272 Pulmonary hypertension, unspecified: Secondary | ICD-10-CM | POA: Diagnosis present

## 2017-08-10 DIAGNOSIS — E870 Hyperosmolality and hypernatremia: Secondary | ICD-10-CM | POA: Diagnosis not present

## 2017-08-10 DIAGNOSIS — Y95 Nosocomial condition: Secondary | ICD-10-CM | POA: Diagnosis not present

## 2017-08-10 DIAGNOSIS — R402362 Coma scale, best motor response, obeys commands, at arrival to emergency department: Secondary | ICD-10-CM | POA: Diagnosis present

## 2017-08-10 DIAGNOSIS — N183 Chronic kidney disease, stage 3 unspecified: Secondary | ICD-10-CM

## 2017-08-10 DIAGNOSIS — I481 Persistent atrial fibrillation: Secondary | ICD-10-CM | POA: Diagnosis not present

## 2017-08-10 DIAGNOSIS — K567 Ileus, unspecified: Secondary | ICD-10-CM | POA: Diagnosis not present

## 2017-08-10 DIAGNOSIS — IMO0001 Reserved for inherently not codable concepts without codable children: Secondary | ICD-10-CM

## 2017-08-10 DIAGNOSIS — R4182 Altered mental status, unspecified: Secondary | ICD-10-CM | POA: Diagnosis present

## 2017-08-10 DIAGNOSIS — E1122 Type 2 diabetes mellitus with diabetic chronic kidney disease: Secondary | ICD-10-CM | POA: Diagnosis present

## 2017-08-10 DIAGNOSIS — K219 Gastro-esophageal reflux disease without esophagitis: Secondary | ICD-10-CM | POA: Diagnosis present

## 2017-08-10 DIAGNOSIS — E872 Acidosis: Secondary | ICD-10-CM | POA: Diagnosis present

## 2017-08-10 DIAGNOSIS — E1165 Type 2 diabetes mellitus with hyperglycemia: Secondary | ICD-10-CM | POA: Diagnosis present

## 2017-08-10 DIAGNOSIS — F0390 Unspecified dementia without behavioral disturbance: Secondary | ICD-10-CM | POA: Diagnosis present

## 2017-08-10 DIAGNOSIS — Z7189 Other specified counseling: Secondary | ICD-10-CM

## 2017-08-10 DIAGNOSIS — F0280 Dementia in other diseases classified elsewhere without behavioral disturbance: Secondary | ICD-10-CM | POA: Diagnosis not present

## 2017-08-10 DIAGNOSIS — E876 Hypokalemia: Secondary | ICD-10-CM | POA: Diagnosis present

## 2017-08-10 DIAGNOSIS — D62 Acute posthemorrhagic anemia: Secondary | ICD-10-CM | POA: Diagnosis not present

## 2017-08-10 DIAGNOSIS — E11610 Type 2 diabetes mellitus with diabetic neuropathic arthropathy: Secondary | ICD-10-CM | POA: Diagnosis present

## 2017-08-10 DIAGNOSIS — R198 Other specified symptoms and signs involving the digestive system and abdomen: Secondary | ICD-10-CM | POA: Diagnosis not present

## 2017-08-10 DIAGNOSIS — K668 Other specified disorders of peritoneum: Secondary | ICD-10-CM

## 2017-08-10 DIAGNOSIS — Z833 Family history of diabetes mellitus: Secondary | ICD-10-CM

## 2017-08-10 DIAGNOSIS — Z9842 Cataract extraction status, left eye: Secondary | ICD-10-CM

## 2017-08-10 DIAGNOSIS — N184 Chronic kidney disease, stage 4 (severe): Secondary | ICD-10-CM | POA: Diagnosis present

## 2017-08-10 DIAGNOSIS — E878 Other disorders of electrolyte and fluid balance, not elsewhere classified: Secondary | ICD-10-CM | POA: Diagnosis not present

## 2017-08-10 DIAGNOSIS — G9341 Metabolic encephalopathy: Secondary | ICD-10-CM | POA: Diagnosis not present

## 2017-08-10 DIAGNOSIS — E1151 Type 2 diabetes mellitus with diabetic peripheral angiopathy without gangrene: Secondary | ICD-10-CM | POA: Diagnosis present

## 2017-08-10 DIAGNOSIS — J9601 Acute respiratory failure with hypoxia: Secondary | ICD-10-CM

## 2017-08-10 DIAGNOSIS — E669 Obesity, unspecified: Secondary | ICD-10-CM | POA: Diagnosis present

## 2017-08-10 DIAGNOSIS — Z01818 Encounter for other preprocedural examination: Secondary | ICD-10-CM | POA: Diagnosis not present

## 2017-08-10 DIAGNOSIS — Z89511 Acquired absence of right leg below knee: Secondary | ICD-10-CM | POA: Diagnosis not present

## 2017-08-10 DIAGNOSIS — Z8711 Personal history of peptic ulcer disease: Secondary | ICD-10-CM

## 2017-08-10 DIAGNOSIS — E119 Type 2 diabetes mellitus without complications: Secondary | ICD-10-CM

## 2017-08-10 DIAGNOSIS — I440 Atrioventricular block, first degree: Secondary | ICD-10-CM | POA: Diagnosis not present

## 2017-08-10 DIAGNOSIS — Z4659 Encounter for fitting and adjustment of other gastrointestinal appliance and device: Secondary | ICD-10-CM

## 2017-08-10 DIAGNOSIS — R402242 Coma scale, best verbal response, confused conversation, at arrival to emergency department: Secondary | ICD-10-CM | POA: Diagnosis present

## 2017-08-10 DIAGNOSIS — I4891 Unspecified atrial fibrillation: Secondary | ICD-10-CM

## 2017-08-10 DIAGNOSIS — J189 Pneumonia, unspecified organism: Secondary | ICD-10-CM | POA: Diagnosis not present

## 2017-08-10 DIAGNOSIS — E778 Other disorders of glycoprotein metabolism: Secondary | ICD-10-CM | POA: Diagnosis not present

## 2017-08-10 DIAGNOSIS — D631 Anemia in chronic kidney disease: Secondary | ICD-10-CM | POA: Diagnosis present

## 2017-08-10 DIAGNOSIS — R680 Hypothermia, not associated with low environmental temperature: Secondary | ICD-10-CM | POA: Diagnosis not present

## 2017-08-10 DIAGNOSIS — Z7401 Bed confinement status: Secondary | ICD-10-CM

## 2017-08-10 DIAGNOSIS — E8770 Fluid overload, unspecified: Secondary | ICD-10-CM | POA: Diagnosis not present

## 2017-08-10 DIAGNOSIS — Z515 Encounter for palliative care: Secondary | ICD-10-CM | POA: Diagnosis not present

## 2017-08-10 DIAGNOSIS — Z7982 Long term (current) use of aspirin: Secondary | ICD-10-CM

## 2017-08-10 HISTORY — DX: Other specified symptoms and signs involving the digestive system and abdomen: R19.8

## 2017-08-10 HISTORY — PX: LAPAROTOMY: SHX154

## 2017-08-10 LAB — I-STAT CG4 LACTIC ACID, ED: LACTIC ACID, VENOUS: 6.41 mmol/L — AB (ref 0.5–1.9)

## 2017-08-10 LAB — COMPREHENSIVE METABOLIC PANEL
ALT: 12 U/L (ref 0–44)
ANION GAP: 15 (ref 5–15)
AST: 18 U/L (ref 15–41)
Albumin: 2.5 g/dL — ABNORMAL LOW (ref 3.5–5.0)
Alkaline Phosphatase: 74 U/L (ref 38–126)
BUN: 33 mg/dL — ABNORMAL HIGH (ref 8–23)
CHLORIDE: 112 mmol/L — AB (ref 98–111)
CO2: 14 mmol/L — ABNORMAL LOW (ref 22–32)
Calcium: 8.5 mg/dL — ABNORMAL LOW (ref 8.9–10.3)
Creatinine, Ser: 1.6 mg/dL — ABNORMAL HIGH (ref 0.44–1.00)
GFR, EST AFRICAN AMERICAN: 34 mL/min — AB (ref >60)
GFR, EST NON AFRICAN AMERICAN: 30 mL/min — AB (ref >60)
Glucose, Bld: 281 mg/dL — ABNORMAL HIGH (ref 70–99)
POTASSIUM: 3.4 mmol/L — AB (ref 3.5–5.1)
Sodium: 141 mmol/L (ref 135–145)
Total Bilirubin: 0.5 mg/dL (ref 0.3–1.2)
Total Protein: 6 g/dL — ABNORMAL LOW (ref 6.5–8.1)

## 2017-08-10 LAB — APTT: aPTT: 22 seconds — ABNORMAL LOW (ref 24–36)

## 2017-08-10 LAB — CBC WITH DIFFERENTIAL/PLATELET
Basophils Absolute: 0.1 10*3/uL (ref 0.0–0.1)
Basophils Relative: 1 %
EOS PCT: 0 %
Eosinophils Absolute: 0 10*3/uL (ref 0.0–0.7)
HEMATOCRIT: 38.3 % (ref 36.0–46.0)
Hemoglobin: 11.3 g/dL — ABNORMAL LOW (ref 12.0–15.0)
LYMPHS PCT: 19 %
Lymphs Abs: 2.4 10*3/uL (ref 0.7–4.0)
MCH: 27.7 pg (ref 26.0–34.0)
MCHC: 29.5 g/dL — ABNORMAL LOW (ref 30.0–36.0)
MCV: 93.9 fL (ref 78.0–100.0)
MONOS PCT: 1 %
Monocytes Absolute: 0.1 10*3/uL (ref 0.1–1.0)
NEUTROS PCT: 79 %
Neutro Abs: 10.2 10*3/uL — ABNORMAL HIGH (ref 1.7–7.7)
Platelets: 290 10*3/uL (ref 150–400)
RBC: 4.08 MIL/uL (ref 3.87–5.11)
RDW: 15.3 % (ref 11.5–15.5)
WBC MORPHOLOGY: INCREASED
WBC: 12.8 10*3/uL — AB (ref 4.0–10.5)

## 2017-08-10 LAB — LIPASE, BLOOD: LIPASE: 70 U/L — AB (ref 11–51)

## 2017-08-10 LAB — PREPARE RBC (CROSSMATCH)

## 2017-08-10 LAB — CBG MONITORING, ED: GLUCOSE-CAPILLARY: 226 mg/dL — AB (ref 70–99)

## 2017-08-10 LAB — PROTIME-INR
INR: 1.33
PROTHROMBIN TIME: 16.3 s — AB (ref 11.4–15.2)

## 2017-08-10 SURGERY — LAPAROTOMY, EXPLORATORY
Anesthesia: General | Site: Abdomen

## 2017-08-10 MED ORDER — ALBUMIN HUMAN 5 % IV SOLN
INTRAVENOUS | Status: DC | PRN
  Administered 2017-08-10 (×3): via INTRAVENOUS

## 2017-08-10 MED ORDER — SODIUM CHLORIDE 0.9 % IV BOLUS (SEPSIS): 1000.0000 mL | Freq: Once | INTRAVENOUS | Status: DC

## 2017-08-10 MED ORDER — LIDOCAINE HCL (CARDIAC) PF 100 MG/5ML IV SOSY
PREFILLED_SYRINGE | INTRAVENOUS | Status: DC | PRN
  Administered 2017-08-10: 60 mg via INTRATRACHEAL

## 2017-08-10 MED ORDER — SODIUM BICARBONATE 8.4 % IV SOLN
INTRAVENOUS | Status: DC | PRN
  Administered 2017-08-10 (×2): 100 meq via INTRAVENOUS

## 2017-08-10 MED ORDER — SODIUM CHLORIDE 0.9 % IV BOLUS
500.0000 mL | Freq: Once | INTRAVENOUS | Status: AC
  Administered 2017-08-10: 500 mL via INTRAVENOUS

## 2017-08-10 MED ORDER — SODIUM CHLORIDE 0.9 % IV SOLN
INTRAVENOUS | Status: DC
  Administered 2017-08-10 – 2017-08-13 (×5): via INTRAVENOUS

## 2017-08-10 MED ORDER — SODIUM CHLORIDE 0.9% IV SOLUTION
Freq: Once | INTRAVENOUS | Status: AC
  Administered 2017-08-11: 02:00:00 via INTRAVENOUS

## 2017-08-10 MED ORDER — FENTANYL CITRATE (PF) 250 MCG/5ML IJ SOLN
INTRAMUSCULAR | Status: AC
  Filled 2017-08-10: qty 5

## 2017-08-10 MED ORDER — SODIUM CHLORIDE 0.9 % IV BOLUS (SEPSIS): 500.0000 mL | Freq: Once | INTRAVENOUS | Status: DC

## 2017-08-10 MED ORDER — SODIUM CHLORIDE 0.9 % IV BOLUS
1000.0000 mL | Freq: Once | INTRAVENOUS | Status: AC
  Administered 2017-08-10: 1000 mL via INTRAVENOUS

## 2017-08-10 MED ORDER — 0.9 % SODIUM CHLORIDE (POUR BTL) OPTIME
TOPICAL | Status: DC | PRN
  Administered 2017-08-10: 3000 mL
  Administered 2017-08-10: 1000 mL

## 2017-08-10 MED ORDER — NOREPINEPHRINE 4 MG/250ML-% IV SOLN
0.0000 ug/min | INTRAVENOUS | Status: DC
  Administered 2017-08-11: 33 ug/min via INTRAVENOUS
  Administered 2017-08-11: 30 ug/min via INTRAVENOUS
  Administered 2017-08-11: 12 ug/min via INTRAVENOUS
  Administered 2017-08-11: 15 ug/min via INTRAVENOUS
  Administered 2017-08-11: 10 ug/min via INTRAVENOUS
  Administered 2017-08-11: 20 ug/min via INTRAVENOUS
  Filled 2017-08-10 (×6): qty 250

## 2017-08-10 MED ORDER — ROCURONIUM BROMIDE 100 MG/10ML IV SOLN
INTRAVENOUS | Status: DC | PRN
  Administered 2017-08-10: 50 mg via INTRAVENOUS

## 2017-08-10 MED ORDER — SODIUM CHLORIDE 0.9 % IV BOLUS (SEPSIS)
1000.0000 mL | Freq: Once | INTRAVENOUS | Status: AC
  Administered 2017-08-10: 1000 mL via INTRAVENOUS

## 2017-08-10 MED ORDER — VASOPRESSIN 20 UNIT/ML IV SOLN
INTRAVENOUS | Status: AC
  Filled 2017-08-10: qty 1

## 2017-08-10 MED ORDER — PHENYLEPHRINE HCL 10 MG/ML IJ SOLN
INTRAMUSCULAR | Status: DC | PRN
  Administered 2017-08-10: 120 ug via INTRAVENOUS

## 2017-08-10 MED ORDER — PIPERACILLIN-TAZOBACTAM 3.375 G IVPB 30 MIN: 3.3750 g | Freq: Once | INTRAVENOUS | Status: DC

## 2017-08-10 MED ORDER — VASOPRESSIN 20 UNIT/ML IV SOLN
INTRAVENOUS | Status: DC | PRN
  Administered 2017-08-10: .04 [IU]/min via INTRAVENOUS

## 2017-08-10 MED ORDER — VASOPRESSIN 20 UNIT/ML IV SOLN
0.0300 [IU]/min | INTRAVENOUS | Status: DC
  Administered 2017-08-11 – 2017-08-15 (×7): 0.03 [IU]/min via INTRAVENOUS
  Filled 2017-08-10 (×3): qty 2

## 2017-08-10 MED ORDER — SODIUM CHLORIDE 0.9 % IV SOLN
INTRAVENOUS | Status: DC | PRN
  Administered 2017-08-10: 50 ug/min via INTRAVENOUS

## 2017-08-10 MED ORDER — VASOPRESSIN 20 UNIT/ML IV SOLN
INTRAVENOUS | Status: DC | PRN
  Administered 2017-08-10 (×2): 1 [IU] via INTRAVENOUS

## 2017-08-10 MED ORDER — PROPOFOL 10 MG/ML IV BOLUS
INTRAVENOUS | Status: AC
  Filled 2017-08-10: qty 20

## 2017-08-10 MED ORDER — ETOMIDATE 2 MG/ML IV SOLN
INTRAVENOUS | Status: DC | PRN
  Administered 2017-08-10: 10 mg via INTRAVENOUS

## 2017-08-10 MED ORDER — SUCCINYLCHOLINE CHLORIDE 20 MG/ML IJ SOLN
INTRAMUSCULAR | Status: DC | PRN
  Administered 2017-08-10: 60 mg via INTRAVENOUS

## 2017-08-10 MED ORDER — NOREPINEPHRINE BITARTRATE 1 MG/ML IV SOLN
INTRAVENOUS | Status: DC | PRN
  Administered 2017-08-10: 2 ug/min via INTRAVENOUS

## 2017-08-10 MED ORDER — CEFAZOLIN SODIUM-DEXTROSE 2-3 GM-%(50ML) IV SOLR
INTRAVENOUS | Status: DC | PRN
  Administered 2017-08-10: 2 g via INTRAVENOUS

## 2017-08-10 MED ORDER — LACTATED RINGERS IV SOLN
INTRAVENOUS | Status: DC | PRN
  Administered 2017-08-10: 22:00:00 via INTRAVENOUS

## 2017-08-10 MED ORDER — FENTANYL CITRATE (PF) 250 MCG/5ML IJ SOLN
INTRAMUSCULAR | Status: DC | PRN
  Administered 2017-08-10 (×2): 50 ug via INTRAVENOUS
  Administered 2017-08-10: 100 ug via INTRAVENOUS
  Administered 2017-08-10: 50 ug via INTRAVENOUS

## 2017-08-10 SURGICAL SUPPLY — 43 items
BLADE CLIPPER SURG (BLADE) IMPLANT
BNDG GAUZE ELAST 4 BULKY (GAUZE/BANDAGES/DRESSINGS) ×3 IMPLANT
CANISTER SUCT 3000ML PPV (MISCELLANEOUS) ×6 IMPLANT
CHLORAPREP W/TINT 26ML (MISCELLANEOUS) ×3 IMPLANT
COVER SURGICAL LIGHT HANDLE (MISCELLANEOUS) ×3 IMPLANT
DRAPE LAPAROSCOPIC ABDOMINAL (DRAPES) ×3 IMPLANT
DRAPE WARM FLUID 44X44 (DRAPE) ×3 IMPLANT
DRSG OPSITE POSTOP 4X10 (GAUZE/BANDAGES/DRESSINGS) IMPLANT
DRSG OPSITE POSTOP 4X8 (GAUZE/BANDAGES/DRESSINGS) IMPLANT
ELECT BLADE 6.5 EXT (BLADE) ×3 IMPLANT
ELECT CAUTERY BLADE 6.4 (BLADE) ×3 IMPLANT
ELECT REM PT RETURN 9FT ADLT (ELECTROSURGICAL) ×3
ELECTRODE REM PT RTRN 9FT ADLT (ELECTROSURGICAL) ×1 IMPLANT
GAUZE SPONGE 4X4 12PLY STRL (GAUZE/BANDAGES/DRESSINGS) ×3 IMPLANT
GLOVE BIO SURGEON STRL SZ 6 (GLOVE) ×3 IMPLANT
GLOVE INDICATOR 6.5 STRL GRN (GLOVE) ×3 IMPLANT
GOWN STRL REUS W/ TWL LRG LVL3 (GOWN DISPOSABLE) ×2 IMPLANT
GOWN STRL REUS W/TWL LRG LVL3 (GOWN DISPOSABLE) ×6
KIT BASIN OR (CUSTOM PROCEDURE TRAY) ×3 IMPLANT
KIT OSTOMY DRAINABLE 2.75 STR (WOUND CARE) ×6 IMPLANT
KIT TURNOVER KIT B (KITS) ×3 IMPLANT
LIGASURE IMPACT 36 18CM CVD LR (INSTRUMENTS) ×3 IMPLANT
NS IRRIG 1000ML POUR BTL (IV SOLUTION) ×6 IMPLANT
PACK GENERAL/GYN (CUSTOM PROCEDURE TRAY) ×3 IMPLANT
PAD ABD 8X10 STRL (GAUZE/BANDAGES/DRESSINGS) ×3 IMPLANT
PAD ARMBOARD 7.5X6 YLW CONV (MISCELLANEOUS) ×9 IMPLANT
RELOAD PROXIMATE 75MM BLUE (ENDOMECHANICALS) ×3 IMPLANT
SPECIMEN JAR LARGE (MISCELLANEOUS) ×3 IMPLANT
SPONGE LAP 18X18 X RAY DECT (DISPOSABLE) ×3 IMPLANT
STAPLER PROXIMATE 100MM BLUE (MISCELLANEOUS) ×3 IMPLANT
STAPLER PROXIMATE 75MM BLUE (STAPLE) ×3 IMPLANT
STAPLER VISISTAT 35W (STAPLE) ×3 IMPLANT
SUCTION POOLE TIP (SUCTIONS) ×3 IMPLANT
SUT PDS AB 1 TP1 96 (SUTURE) ×6 IMPLANT
SUT SILK 2 0 SH CR/8 (SUTURE) ×3 IMPLANT
SUT SILK 2 0 TIES 10X30 (SUTURE) ×3 IMPLANT
SUT SILK 3 0 SH CR/8 (SUTURE) ×3 IMPLANT
SUT SILK 3 0 TIES 10X30 (SUTURE) ×3 IMPLANT
SUT VIC AB 3-0 SH 18 (SUTURE) IMPLANT
SUT VIC AB 3-0 SH 8-18 (SUTURE) ×6 IMPLANT
TAPE CLOTH SURG 4X10 WHT LF (GAUZE/BANDAGES/DRESSINGS) ×3 IMPLANT
TOWEL OR 17X26 10 PK STRL BLUE (TOWEL DISPOSABLE) ×3 IMPLANT
TRAY FOLEY MTR SLVR 16FR STAT (SET/KITS/TRAYS/PACK) ×3 IMPLANT

## 2017-08-10 NOTE — ED Notes (Signed)
RN attempted for 2nd IV without success

## 2017-08-10 NOTE — Anesthesia Preprocedure Evaluation (Addendum)
Anesthesia Evaluation  Patient identified by MRN, date of birth, ID band Patient awake    Reviewed: Allergy & Precautions, NPO status , Patient's Chart, lab work & pertinent test results  Airway Mallampati: II  TM Distance: >3 FB Neck ROM: Full    Dental  (+) Edentulous Upper, Edentulous Lower   Pulmonary asthma , former smoker,    Pulmonary exam normal breath sounds clear to auscultation       Cardiovascular hypertension, Pt. on home beta blockers Normal cardiovascular exam Rhythm:Regular Rate:Normal  ECG: SR, rate 89   Neuro/Psych  Headaches, PSYCHIATRIC DISORDERS Anxiety Dementia altered mental status    GI/Hepatic Neg liver ROS, GERD  Medicated,Colitis   Endo/Other  diabetes, Insulin Dependent  Renal/GU Renal disease     Musculoskeletal negative musculoskeletal ROS (+)   Abdominal   Peds  Hematology HLD   Anesthesia Other Findings Free air  Septic shock  Reproductive/Obstetrics                            Anesthesia Physical Anesthesia Plan  ASA: IV and emergent  Anesthesia Plan: General   Post-op Pain Management:    Induction: Intravenous  PONV Risk Score and Plan: 3 and Ondansetron and Treatment may vary due to age or medical condition  Airway Management Planned: Oral ETT  Additional Equipment: Arterial line, CVP and Ultrasound Guidance Line Placement  Intra-op Plan:   Post-operative Plan: Post-operative intubation/ventilation  Informed Consent: I have reviewed the patients History and Physical, chart, labs and discussed the procedure including the risks, benefits and alternatives for the proposed anesthesia with the patient or authorized representative who has indicated his/her understanding and acceptance.   Dental advisory given  Plan Discussed with: CRNA  Anesthesia Plan Comments:         Anesthesia Quick Evaluation

## 2017-08-10 NOTE — Anesthesia Procedure Notes (Signed)
Procedure Name: Intubation Date/Time: 07/26/2017 9:41 PM Performed by: Clovis Cao, CRNA Pre-anesthesia Checklist: Patient identified, Emergency Drugs available, Suction available, Patient being monitored and Timeout performed Patient Re-evaluated:Patient Re-evaluated prior to induction Oxygen Delivery Method: Circle system utilized Preoxygenation: Pre-oxygenation with 100% oxygen Induction Type: IV induction, Rapid sequence and Cricoid Pressure applied Laryngoscope Size: Miller and 2 Grade View: Grade I Tube type: Subglottic suction tube Tube size: 7.5 mm Number of attempts: 1 Airway Equipment and Method: Stylet Placement Confirmation: ETT inserted through vocal cords under direct vision,  positive ETCO2 and breath sounds checked- equal and bilateral Secured at: 22 cm Tube secured with: Tape Dental Injury: Teeth and Oropharynx as per pre-operative assessment

## 2017-08-10 NOTE — Anesthesia Procedure Notes (Signed)
Arterial Line Insertion Start/End07/19/2019 9:30 PM, 08/10/2017 9:35 PM Performed by: Murvin Natal, MD, [place], Kathrin Penner, CRNA, CRNA  Preanesthetic checklist: patient identified, IV checked, surgical consent, monitors and equipment checked and pre-op evaluation Left, radial was placed Catheter size: 20 G Hand hygiene performed  and maximum sterile barriers used   Attempts: 1 Procedure performed without using ultrasound guided technique. Ultrasound Notes:anatomy identified Following insertion, dressing applied and Biopatch.

## 2017-08-10 NOTE — H&P (Addendum)
Surgical Consultation Requesting provider: Dorie Rank MD  CC: altered mental status  HPI: Taken from chart and family as patient is unable to provide a history due to altered mental status. 77yo woman with diabetes, hx dementia and BKA, remote hx of colitis who is brought to ER after staff at her nursing home noted altered mental status. Family at bedside reports she is normally interactive and conversant, they last saw her in her normal state on Sunday. They state she was not complaining of abdominal pain or anything at that time. Initial eval in the ER includes CXR which shows large amount of pneumoperitoneum. Lactic acidosis. Hypotensions transiently responding to crystalloid.  No Known Allergies  Past Medical History:  Diagnosis Date  . Anxiety   . Arthritis    "left leg" (05/17/2013)  . Asthma   . 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 Closed right ankle fracture   . Colitis 09/06/02  . Diabetes mellitus without complication ([place])   . Fracture of femur, distal, right, closed ([place]) 03/01/2012  . GERD (gastroesophageal reflux disease)   . HDQQIWLN(989.2)    "probably weekly" (05/17/2013)  . Hemorrhoid   . High cholesterol   . History of stomach ulcers   . Hyperlipidemia   . Hypertension   . Insulin dependent diabetes mellitus ([place]) 03/01/2012  . Obesity (BMI 30-39.9) 03/03/2012  . Pneumonia    "once" (05/17/2013)  . Proctocolitis 09/04/02    Past Surgical History:  Procedure Laterality Date  . ABDOMINAL HYSTERECTOMY    . BELOW KNEE LEG AMPUTATION Right 02/2008  . BREAST LUMPECTOMY Bilateral    "not cancer"  . CATARACT EXTRACTION W/ INTRAOCULAR LENS  IMPLANT, BILATERAL Bilateral   . ORIF FEMUR FRACTURE  03/01/2012   Procedure: OPEN REDUCTION INTERNAL FIXATION (ORIF) DISTAL FEMUR FRACTURE;  Surgeon: Rozanna Box, MD;  Location: [facility];  Service: Orthopedics;  Laterality: Right;  ORIF right femur    Family History  Problem Relation Age of Onset  . Diabetes Mother   .  Diabetes Father   . Diabetes Sister        x3  . Diabetes Brother        x4  . Colon cancer Neg Hx   . Colon polyps Neg Hx     Social History   Socioeconomic History  . Marital status: Single    Spouse name: Not on file  . Number of children: 7  . Years of education: Not on file  . Highest education level: Not on file  Occupational History  . Occupation: Retired  Scientific laboratory technician  . Financial resource strain: Not on file  . Food insecurity:    Worry: Not on file    Inability: Not on file  . Transportation needs:    Medical: Not on file    Non-medical: Not on file  Tobacco Use  . Smoking status: Former Smoker    Packs/day: 1.00    Years: 5.00    Pack years: 5.00    Last attempt to quit: 01/26/1993    Years since quitting: 24.5  . Smokeless tobacco: Former Systems developer  . Tobacco comment: "quit smoking at ~ age 85"  Substance and Sexual Activity  . Alcohol use: No  . Drug use: No  . Sexual activity: Never  Lifestyle  . Physical activity:    Days per week: Not on file    Minutes per session: Not on file  . Stress: Not on file  Relationships  . Social  connections:    Talks on phone: Not on file    Gets together: Not on file    Attends religious service: Not on file    Active member of club or organization: Not on file    Attends meetings of clubs or organizations: Not on file    Relationship status: Not on file  Other Topics Concern  . Not on file  Social History Narrative   ** Merged History Encounter **       Pt lives at home with son   Uses prosthesis on L BKA with walker   Fairly independent    No current facility-administered medications on file prior to encounter.    Current Outpatient Medications on File Prior to Encounter  Medication Sig Dispense Refill  . acetaminophen (TYLENOL) 650 MG CR tablet Take 650 mg by mouth every 4 (four) hours as needed for pain.    . Amino Acids-Protein Hydrolys (FEEDING SUPPLEMENT, PRO-STAT SUGAR FREE 64,) LIQD Take 30 mLs by  mouth 2 (two) times daily.    Marland Kitchen aspirin 325 MG tablet Take 325 mg by mouth daily with breakfast.     . atorvastatin (LIPITOR) 20 MG tablet Take 20 mg by mouth every evening.     . cloNIDine (CATAPRES) 0.1 MG tablet Take 0.1 mg by mouth 2 (two) times daily.    Marland Kitchen Dextrose, Diabetic Use, (INSTA-GLUCOSE) 77.4 % GEL Take 1 vial by mouth daily as needed (Diabetes, Give when CBG is <60).    . insulin aspart (NOVOLOG) 100 UNIT/ML injection Inject 0-15 Units into the skin 3 (three) times daily before meals. Per sliding scale   0-150 = 0 units 151-200 = 3 units 201-250 = 5 units 251-300 = 7 units 301-350 = 9 units 351-400 = 11 units 401-450 = 13 units 451-500 = 15 units and call provider    . insulin detemir (LEVEMIR) 100 UNIT/ML injection Inject 0.06 mLs (6 Units total) into the skin daily. 10 mL 11  . loperamide (IMODIUM) 2 MG capsule Take 2-4 mg by mouth as needed for diarrhea or loose stools.    Marland Kitchen LORazepam (ATIVAN) 0.5 MG tablet Take 1 tablet (0.5 mg total) by mouth every 6 (six) hours as needed for anxiety. 8 tablet 0  . magnesium hydroxide (MILK OF MAGNESIA) 400 MG/5ML suspension Take 30 mLs by mouth daily as needed for mild constipation.    . megestrol (MEGACE) 400 MG/10ML suspension Take 800 mg by mouth daily.    . metoprolol succinate (TOPROL-XL) 50 MG 24 hr tablet Take 75 mg by mouth daily with breakfast. Take with or immediately following a meal.     . mirtazapine (REMERON) 7.5 MG tablet Take 7.5 mg by mouth every other day.    . polyethylene glycol (MIRALAX / GLYCOLAX) packet Take 17 g by mouth daily. (Patient taking differently: Take 17 g by mouth daily with breakfast. ) 14 each 0  . promethazine (PHENERGAN) 25 MG tablet Take 25 mg by mouth every 6 (six) hours as needed for nausea or vomiting.    . ranitidine (ZANTAC) 150 MG tablet Take 150 mg by mouth every evening.     . vitamin C (ASCORBIC ACID) 500 MG tablet Take 500 mg by mouth 2 (two) times daily.    Marland Kitchen zinc gluconate 50 MG tablet  Take 50 mg by mouth daily with breakfast.      Review of Systems: a complete, 10pt review of systems was unable to be completed due to patient mental status  Physical Exam: Vitals:   08/22/2017 1840  BP: (!) 113/54  Pulse: 93  Resp: (!) 30  Temp: 98.7 F (37.1 C)  SpO2: 98%   Gen: Obtunded Head: normocephalic, atraumatic Eyes: pupils equally reactive, anicteric.  Neck: supple without mass or thyromegaly Chest: unlabored respirations, symmetrical air entry, clear bilaterally   Cardiovascular: RRR with palpable distal pulses, no pedal edema Abdomen: soft, distended/ firm, tender. No mass or organomegaly.  Extremities: warm, without edema, no deformities, R BKA Neuro: unable to examine due to mental status Psych: unable to examine due to mental status Skin: warm and dry   CBC Latest Ref Rng & Units 08/03/2017 04/28/2017 04/27/2017  WBC 4.0 - 10.5 K/uL 12.8(H) 6.5 6.6  Hemoglobin 12.0 - 15.0 g/dL 11.3(L) 10.4(L) 9.7(L)  Hematocrit 36.0 - 46.0 % 38.3 32.7(L) 30.6(L)  Platelets 150 - 400 K/uL 290 256 251    CMP Latest Ref Rng & Units 04/28/2017 04/27/2017 04/26/2017  Glucose 65 - 99 mg/dL 162(H) 124(H) 137(H)  BUN 6 - 20 mg/dL 31(H) 23(H) 25(H)  Creatinine 0.44 - 1.00 mg/dL 1.03(H) 1.09(H) 0.87  Sodium 135 - 145 mmol/L 137 138 138  Potassium 3.5 - 5.1 mmol/L 3.6 3.6 4.1  Chloride 101 - 111 mmol/L 108 108 109  CO2 22 - 32 mmol/L 19(L) 23 19(L)  Calcium 8.9 - 10.3 mg/dL 8.1(L) 8.1(L) 8.5(L)  Total Protein 6.5 - 8.1 g/dL - - 5.5(L)  Total Bilirubin 0.3 - 1.2 mg/dL - - 0.7  Alkaline Phos 38 - 126 U/L - - 57  AST 15 - 41 U/L - - 15  ALT 14 - 54 U/L - - 7(L)    Lab Results  Component Value Date   INR 1.33 08/09/2017   INR 1.11 04/26/2017   INR 1.07 02/29/2012    Imaging: Ct Head Wo Contrast  Result Date: 07/30/2017 CLINICAL DATA:  Altered mental status from baseline. EXAM: CT HEAD WITHOUT CONTRAST TECHNIQUE: Contiguous axial images were obtained from the base of the skull through  the vertex without intravenous contrast. COMPARISON:  04/23/2017 FINDINGS: Brain: Subtle hypodensity involving the left pons and midbrain may represent nonhemorrhagic infarcts, apparently new since prior comparison on 04/23/2017. Atrophy with chronic stable small vessel ischemic disease of periventricular white matter. No acute intracranial hemorrhage, midline shift or edema. No large vascular territory infarct. No intra-axial mass nor extra-axial fluid collections. Midline fourth ventricle and basal cisterns without effacement. Vascular: Hyperdense vessel sign. Skull: No acute skull fracture Sinuses/Orbits: Bilateral lens replacements. Clear paranasal sinuses. Other: Clear mastoids IMPRESSION: 1. Chronic appearing small vessel ischemic disease of periventricular white matter with atrophy. 2. Age indeterminate but possibly new since prior exam are subtle hypodensities in the left pons and midbrain suspicious for small nonhemorrhagic infarcts. Electronically Signed   By: Ashley Royalty M.D.   On: 08/21/2017 20:45   Dg Chest Port 1 View  Result Date: 08/06/2017 CLINICAL DATA:  Worsening altered mental status. History of dementia. EXAM: PORTABLE CHEST 1 VIEW COMPARISON:  Chest radiograph April 23, 2017 FINDINGS: Air underneath bilateral hemidiaphragms. Bibasilar atelectasis. Stable cardiomegaly. Calcified aortic arch. No pneumothorax. Osteopenia. IMPRESSION: 1. Pneumoperitoneum. 2. Stable cardiomegaly.  Bibasilar atelectasis/scarring. 3.  Aortic Atherosclerosis (ICD10-I70.0). 4. Critical Value/emergent results were called by telephone at the time of interpretation on 07/30/2017 at 7:23 pm to Dr. Dorie Rank , who verbally acknowledged these results. Electronically Signed   By: Elon Alas M.D.   On: 07/28/2017 19:24    A/P: Pneumoperitoneum, large amount of stool in rectum/ sigmoid  colon. Peritonitis and septic shock. Dehydration. -To OR for exploratory laparotomy. I obtained consent from the multiple family  members at the bedside. I discussed the risks of bleeding, infection, pain, possible bowel resection, possible ostomy, possible failure to identify cause, possible open abdomen, possible prolonged ICU course/ vent dependence, possibility she may never return to baseline, risk of DVT/PE, pneumonia, heart attack, stroke, death. They expressed understanding and questions were answered to their satisfaction. -Plan ICU admission post-op for ongoing resuscitation   Romana Juniper, MD [facility] Surgery, [place] Pager [phone]

## 2017-08-10 NOTE — ED Provider Notes (Signed)
[facility] EMERGENCY DEPARTMENT Provider Note   CSN: [id number] Arrival date & time: 08/12/2017  1819     History   Chief Complaint Chief Complaint  Patient presents with  . Altered Mental Status    HPI Judith [name] is a 77 y.o. female.  HPI Patient presented to the emergency room for evaluation of altered mental status.  Patient has a history of dementia.  She resides at a nursing facility.  According to the EMS report staff at the facility is stated that the patient was more altered than usual.  They also indicated that her blood sugar was elevated.  Patient's family member who is in the ED with her states that she is not acting like herself at all.  Usually she is very alert and is able to carry on a conversation.  Patient's behavior today is not like her usual self at all.  No known fevers.  No known vomiting.  No recent injuries.  Patient herself is confused and is not really able to tell me why she is here. Past Medical History:  Diagnosis Date  . Anxiety   . Arthritis    "left leg" (05/17/2013)  . Asthma   . 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 Closed right ankle fracture   . Colitis 09/06/02  . Diabetes mellitus without complication ([place])   . Fracture of femur, distal, right, closed ([place]) 03/01/2012  . GERD (gastroesophageal reflux disease)   . WUJWJXBJ(478.2)    "probably weekly" (05/17/2013)  . Hemorrhoid   . High cholesterol   . History of stomach ulcers   . Hyperlipidemia   . Hypertension   . Insulin dependent diabetes mellitus ([place]) 03/01/2012  . Obesity (BMI 30-39.9) 03/03/2012  . Pneumonia    "once" (05/17/2013)  . Proctocolitis 09/04/02    Patient Active Problem List   Diagnosis Date Noted  . Encounter for hospice care discussion   . Palliative care encounter   . Acute UTI   . AKI (acute kidney injury) ([place])   . Osteomyelitis of left foot ([place])   . UTI (urinary tract infection) 04/23/2017  . 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 Uterine hypertrophy 06/11/2015  . Kidney lesion, native, bilateral 06/11/2015  . Protein-calorie malnutrition, severe ([place]) 09/22/2013  . Dementia 09/21/2013  . Constipation 09/21/2013  . Rectal bleeding 09/21/2013  . Fecal impaction ([place]) 09/21/2013  . BRBPR (bright red blood per rectum) 09/21/2013  . Altered mental status 05/17/2013  . Altered mental state 05/17/2013  . Delirium due to another medical condition 02/18/2013  . Acute blood loss anemia 03/03/2012  . 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 Fall 03/03/2012  . Obesity (BMI 30-39.9) 03/03/2012  . Poor nutrition 03/03/2012  . Fracture of femur, distal, right, closed ([place]) 03/01/2012  . Lower urinary tract infectious disease 03/01/2012  . Insulin dependent diabetes mellitus ([place]) 03/01/2012  . Hypertension     Past Surgical History:  Procedure Laterality Date  . ABDOMINAL HYSTERECTOMY    . BELOW KNEE LEG AMPUTATION Right 02/2008  . BREAST LUMPECTOMY Bilateral    "not cancer"  . CATARACT EXTRACTION W/ INTRAOCULAR LENS  IMPLANT, BILATERAL Bilateral   . ORIF FEMUR FRACTURE  03/01/2012   Procedure: OPEN REDUCTION INTERNAL FIXATION (ORIF) DISTAL FEMUR FRACTURE;  Surgeon: Rozanna Box, MD;  Location: [facility];  Service: Orthopedics;  Laterality: Right;  ORIF right femur     OB History  Gravida  0   Para  0   Term  0   Preterm  0   AB  0   Living        SAB  0   TAB  0   Ectopic  0   Multiple      Live Births               Home Medications    Prior to Admission medications   Medication Sig Start Date End Date Taking? Authorizing Provider  acetaminophen (TYLENOL) 650 MG CR tablet Take 650 mg by mouth every 4 (four) hours as needed for pain.    [provider]  Amino Acids-Protein Hydrolys (FEEDING SUPPLEMENT, PRO-STAT SUGAR FREE 64,) LIQD Take 30 mLs by mouth 2 (two) times daily.    [provider]  aspirin 325 MG tablet Take 325 mg by mouth daily with  breakfast.     [provider]  atorvastatin (LIPITOR) 20 MG tablet Take 20 mg by mouth every evening.     [provider]  cloNIDine (CATAPRES) 0.1 MG tablet Take 0.1 mg by mouth 2 (two) times daily.    [provider]  Dextrose, Diabetic Use, (INSTA-GLUCOSE) 77.4 % GEL Take 1 vial by mouth daily as needed (Diabetes, Give when CBG is <60).    [provider]  insulin aspart (NOVOLOG) 100 UNIT/ML injection Inject 0-15 Units into the skin 3 (three) times daily before meals. Per sliding scale   0-150 = 0 units 151-200 = 3 units 201-250 = 5 units 251-300 = 7 units 301-350 = 9 units 351-400 = 11 units 401-450 = 13 units 451-500 = 15 units and call provider    [provider]  insulin detemir (LEVEMIR) 100 UNIT/ML injection Inject 0.06 mLs (6 Units total) into the skin daily. 04/28/17   Aline August, MD  loperamide (IMODIUM) 2 MG capsule Take 2-4 mg by mouth as needed for diarrhea or loose stools.    [provider]  LORazepam (ATIVAN) 0.5 MG tablet Take 1 tablet (0.5 mg total) by mouth every 6 (six) hours as needed for anxiety. 04/28/17   Aline August, MD  magnesium hydroxide (MILK OF MAGNESIA) 400 MG/5ML suspension Take 30 mLs by mouth daily as needed for mild constipation.    [provider]  megestrol (MEGACE) 400 MG/10ML suspension Take 800 mg by mouth daily.    [provider]  metoprolol succinate (TOPROL-XL) 50 MG 24 hr tablet Take 75 mg by mouth daily with breakfast. Take with or immediately following a meal.     [provider]  mirtazapine (REMERON) 7.5 MG tablet Take 7.5 mg by mouth every other day.    [provider]  polyethylene glycol (MIRALAX / GLYCOLAX) packet Take 17 g by mouth daily. Patient taking differently: Take 17 g by mouth daily with breakfast.  09/22/13   Lacy Duverney M, PA-C  promethazine (PHENERGAN) 25 MG tablet Take 25 mg by mouth every 6 (six) hours as needed for nausea or  vomiting.    [provider]  ranitidine (ZANTAC) 150 MG tablet Take 150 mg by mouth every evening.     [provider]  vitamin C (ASCORBIC ACID) 500 MG tablet Take 500 mg by mouth 2 (two) times daily.    [provider]  zinc gluconate 50 MG tablet Take 50 mg by mouth daily with breakfast.    [provider]    Family History Family History  Problem Relation  Age of Onset  . Diabetes Mother   . Diabetes Father   . Diabetes Sister        x3  . Diabetes Brother        x4  . Colon cancer Neg Hx   . Colon polyps Neg Hx     Social History Social History   Tobacco Use  . Smoking status: Former Smoker    Packs/day: 1.00    Years: 5.00    Pack years: 5.00    Last attempt to quit: 01/26/1993    Years since quitting: 24.5  . Smokeless tobacco: Former Systems developer  . Tobacco comment: "quit smoking at ~ age 84"  Substance Use Topics  . Alcohol use: No  . Drug use: No     Allergies   Patient has no known allergies.   Review of Systems Review of Systems  All other systems reviewed and are negative.    Physical Exam Updated Vital Signs BP (!) 85/71   Pulse 85   Temp 98.7 F (37.1 C) (Rectal)   Resp (!) 35   SpO2 (!) 86%   Physical Exam  Constitutional: No distress.  Elderly, frail  HENT:  Head: Normocephalic and atraumatic.  Right Ear: External ear normal.  Left Ear: External ear normal.  Eyes: Conjunctivae are normal. Right eye exhibits no discharge. Left eye exhibits no discharge. No scleral icterus.  Neck: Neck supple. No tracheal deviation present.  Cardiovascular: Normal rate, regular rhythm and intact distal pulses.  Pulmonary/Chest: Effort normal and breath sounds normal. No stridor. No respiratory distress. She has no wheezes. She has no rales.  Abdominal: Soft. Bowel sounds are normal. She exhibits no distension. There is no tenderness. There is no rebound and no guarding.  Musculoskeletal: She exhibits edema ( Edema on the left  lower extremity). She exhibits no tenderness.  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 No cranial nerve deficit (no facial droop, questionable slurred speech, questionable gaze deviation to the left) or sensory deficit. She exhibits normal muscle tone. She displays no seizure activity. GCS eye subscore is 4. GCS motor subscore is 6.  Patient appears to have generalized weakness left greater than right, she had some difficulty with grip strength on the left side, unable to lift her left leg off the bed  Skin: Skin is warm and dry. No rash noted.  Psychiatric: She has a normal mood and affect.  Nursing note and vitals reviewed.    ED Treatments / Results  Labs (all labs ordered are listed, but only abnormal results are displayed) Labs Reviewed  COMPREHENSIVE METABOLIC PANEL - Abnormal; Notable for the following components:      Result Value   Potassium 3.4 (*)    Chloride 112 (*)    CO2 14 (*)    Glucose, Bld 281 (*)    BUN 33 (*)    Creatinine, Ser 1.60 (*)    Calcium 8.5 (*)    Total Protein 6.0 (*)    Albumin 2.5 (*)    GFR calc non Af Amer 30 (*)    GFR calc Af Amer 34 (*)    All other components within normal limits  CBC WITH DIFFERENTIAL/PLATELET - Abnormal; Notable for the following components:   WBC 12.8 (*)    Hemoglobin 11.3 (*)    MCHC 29.5 (*)    Neutro Abs 10.2 (*)    All other components within normal limits  APTT - Abnormal; Notable for  the following components:   aPTT 22 (*)    All other components within normal limits  PROTIME-INR - Abnormal; Notable for the following components:   Prothrombin Time 16.3 (*)    All other components within normal limits  LIPASE, BLOOD - Abnormal; Notable for the following components:   Lipase 70 (*)    All other components within normal limits  CBG MONITORING, ED - Abnormal; Notable for the following components:   Glucose-Capillary 226 (*)    All other components within  normal limits  I-STAT CG4 LACTIC ACID, ED - Abnormal; Notable for the following components:   Lactic Acid, Venous 6.41 (*)    All other components within normal limits  CULTURE, BLOOD (ROUTINE X 2)  CULTURE, BLOOD (ROUTINE X 2)  URINALYSIS, COMPLETE (UACMP) WITH MICROSCOPIC    EKG EKG Interpretation  Date/Time:  Tuesday August 10 2017 18:31:21 EDT Ventricular Rate:  89 PR Interval:    QRS Duration: 100 QT Interval:  400 QTC Calculation: 487 R Axis:   4 Text Interpretation:  Sinus rhythm Ventricular premature complex Prolonged PR interval Abnormal R-wave progression, early transition Left ventricular hypertrophy ST depression, consider ischemia, lateral lds ST elevation, consider inferior injury Borderline prolonged QT interval No significant change since last tracing Confirmed by Dorie Rank [phone]) on 08/11/2017 6:52:04 PM   Radiology Ct Abdomen Pelvis Wo Contrast  Result Date: 08/07/2017 CLINICAL DATA:  Abdominal distension.  Follow-up pneumoperitoneum. EXAM: CT ABDOMEN AND PELVIS WITHOUT CONTRAST TECHNIQUE: Multidetector CT imaging of the abdomen and pelvis was performed following the standard protocol without IV contrast. COMPARISON:  Abdominal radiograph August 10, 2016 and CT abdomen and pelvis August 01, 2015. FINDINGS: LOWER CHEST: Bilateral lower lobe consolidation. Heart size is normal. Moderate coronary artery calcifications. No pericardial effusion. HEPATOBILIARY: Normal. PANCREAS: Normal. SPLEEN: Normal. ADRENALS/URINARY TRACT: Kidneys are orthotopic, demonstrating normal size and morphology. No nephrolithiasis, hydronephrosis; limited assessment for renal masses by nonenhanced CT. 2.5 cm dense (70 Hounsfield units) RIGHT lower pole proteinaceous/hemorrhagic cyst, no routine indicated follow-up by consensus. 9 mm dense RIGHT interpolar cyst. Isodense probable mass lower pole LEFT kidney. All renal masses appear relatively stable from prior contrast enhanced CT. The unopacified ureters  are normal in course and caliber. Urinary bladder is partially distended and unremarkable. Normal adrenal glands. STOMACH/BOWEL: Moderate hiatal hernia. Large amount of stool in sigmoid colon through the level of the anus, rectum distended to 9.1 cm. At proximal sigmoid colon is a linear intramural lucency concerning for perforation (coronal image 32), associated circumferential focal mural thickening. Adjacent feculent appearing material in adjacent peritoneum. Moderate colonic diverticulosis. Debris distended stomach. Normal appendix. VASCULAR/LYMPHATIC: Aortoiliac vessels are normal in course and caliber. Moderate to severe calcific atherosclerosis. No lymphadenopathy by CT size criteria. REPRODUCTIVE: Normal. OTHER: Small volume ascites LEFT upper quadrant. Large volume pneumoperitoneum. MUSCULOSKELETAL: Non-acute. IMPRESSION: 1. Large volume pneumoperitoneum. Probable sigmoid bowel perforation with adjacent probable stool in peritoneal cavity. Ulcerated tumor not excluded. 2. Large volume rectosigmoid stool, rectum distended to 9.1 cm. 3. Bilateral lower lobe consolidation concerning for pneumonia. 4. Critical Value/emergent results were called by telephone at the time of interpretation on 08/22/2017 at 8:50 pm to Dr. Dorie Rank , who verbally acknowledged these results. Electronically Signed   By: Elon Alas M.D.   On: 08/24/2017 20:54   Ct Head Wo Contrast  Result Date: 08/06/2017 CLINICAL DATA:  Altered mental status from baseline. EXAM: CT HEAD WITHOUT CONTRAST TECHNIQUE: Contiguous axial images were obtained from the base of the skull through the  vertex without intravenous contrast. COMPARISON:  04/23/2017 FINDINGS: Brain: Subtle hypodensity involving the left pons and midbrain may represent nonhemorrhagic infarcts, apparently new since prior comparison on 04/23/2017. Atrophy with chronic stable small vessel ischemic disease of periventricular white matter. No acute intracranial hemorrhage,  midline shift or edema. No large vascular territory infarct. No intra-axial mass nor extra-axial fluid collections. Midline fourth ventricle and basal cisterns without effacement. Vascular: Hyperdense vessel sign. Skull: No acute skull fracture Sinuses/Orbits: Bilateral lens replacements. Clear paranasal sinuses. Other: Clear mastoids IMPRESSION: 1. Chronic appearing small vessel ischemic disease of periventricular white matter with atrophy. 2. Age indeterminate but possibly new since prior exam are subtle hypodensities in the left pons and midbrain suspicious for small nonhemorrhagic infarcts. Electronically Signed   By: Ashley Royalty M.D.   On: 08/21/2017 20:45   Dg Chest Port 1 View  Result Date: 08/04/2017 CLINICAL DATA:  Worsening altered mental status. History of dementia. EXAM: PORTABLE CHEST 1 VIEW COMPARISON:  Chest radiograph April 23, 2017 FINDINGS: Air underneath bilateral hemidiaphragms. Bibasilar atelectasis. Stable cardiomegaly. Calcified aortic arch. No pneumothorax. Osteopenia. IMPRESSION: 1. Pneumoperitoneum. 2. Stable cardiomegaly.  Bibasilar atelectasis/scarring. 3.  Aortic Atherosclerosis (ICD10-I70.0). 4. Critical Value/emergent results were called by telephone at the time of interpretation on 08/12/2017 at 7:23 pm to Dr. Dorie Rank , who verbally acknowledged these results. Electronically Signed   By: Elon Alas M.D.   On: 08/13/2017 19:24    Procedures .Critical Care Performed by: Dorie Rank, MD Authorized by: Dorie Rank, MD   Critical care provider statement:    Critical care time (minutes):  45   Critical care was time spent personally by me on the following activities:  Discussions with consultants, evaluation of patient's response to treatment, examination of patient, ordering and performing treatments and interventions, ordering and review of laboratory studies, ordering and review of radiographic studies, pulse oximetry, re-evaluation of patient's condition, obtaining  history from patient or surrogate and review of old charts   (including critical care time)  Medications Ordered in ED Medications  sodium chloride 0.9 % bolus 500 mL (500 mLs Intravenous New Bag/Given 08/01/2017 1933)    And  0.9 %  sodium chloride infusion (has no administration in time range)  piperacillin-tazobactam (ZOSYN) IVPB 3.375 g (has no administration in time range)  sodium chloride 0.9 % bolus 1,000 mL (1,000 mLs Intravenous New Bag/Given 08/09/2017 2054)    And  sodium chloride 0.9 % bolus 1,000 mL (has no administration in time range)    And  sodium chloride 0.9 % bolus 500 mL (has no administration in time range)  sodium chloride 0.9 % bolus 1,000 mL (0 mLs Intravenous Stopped 07/27/2017 2053)     Initial Impression / Assessment and Plan / ED Course  I have reviewed the triage vital signs and the nursing notes.  Pertinent labs & imaging results that were available during my care of the patient were reviewed by me and considered in my medical decision making (see chart for details).  Clinical Course as of Aug 10 2120  Tue Aug 10, 2017  1953 Pneumoperitoneum noted on CXR.  CT scan ordered.  IV abx and fluids ordered   [JK]  2019 Consulted with family members.  They indicated that the patient would want to be DNR.  They do not think she would want to be on a ventilator.  They she would be amenable to surgery if it was necessary.   [JK]  2034 D/w Dr Kae Heller, general surgery   [JK]  2040 Notified that BP is low.  IV fluid boluses continued.     [JK]    Clinical Course User Index [JK] Dorie Rank, MD    Patient presented to the emergency room for evaluation of altered mental status.  While the patient was here in the emergency room it became evident that she her blood pressure was low.  Her initial chest x-ray to evaluate for pneumonia showed free air underneath the diaphragm.  Patient was empirically started on IV antibiotics.  She was infused IV fluids.  Code sepsis was  activated.  I discussed CODE STATUS with family members and they did indicate that the patient would not want to be on a ventilator long-term.  She also is DNR but they did want any necessary treatment at this time.  I spoke with Dr. Windle Guard from general surgery.  She came down emergently to the ED and plans on taking her to the operating room.  Final Clinical Impressions(s) / ED Diagnoses   Final diagnoses:  Intra-abdominal free air of unknown etiology      Dorie Rank, MD 08/18/2017 2122

## 2017-08-10 NOTE — Anesthesia Procedure Notes (Signed)
Central Venous Catheter Insertion Performed by: Murvin Natal, MD, anesthesiologist Start/End07/09/2017 9:45 PM, 08/10/2017 9:50 PM Patient location: OR. Emergency situation Preanesthetic checklist: patient identified, IV checked, site marked, risks and benefits discussed, surgical consent, monitors and equipment checked, pre-op evaluation, timeout performed and anesthesia consent Position: Trendelenburg Patient sedated Hand hygiene performed  and maximum sterile barriers used  Catheter size: 8 Fr Total catheter length 16. Central line was placed.Double lumen Procedure performed using ultrasound guided technique. Ultrasound Notes:anatomy identified, needle tip was noted to be adjacent to the nerve/plexus identified, no ultrasound evidence of intravascular and/or intraneural injection and image(s) printed for medical record Attempts: 1 Following insertion, dressing applied, line sutured and Biopatch. Post procedure assessment: blood return through all ports, no air and free fluid flow  Patient tolerated the procedure well with no immediate complications.

## 2017-08-10 NOTE — ED Triage Notes (Signed)
Pt BIB [facility] from [facility], hx dementia, facility reports pt more altered than baseline. EMS CBG-381, SpO2 "between 80-100%", initial BP 90/50, after 500ccNS, BP increased to 677 systolic.

## 2017-08-11 ENCOUNTER — Encounter (HOSPITAL_COMMUNITY): Payer: Self-pay | Admitting: Surgery

## 2017-08-11 ENCOUNTER — Inpatient Hospital Stay (HOSPITAL_COMMUNITY): Payer: Medicare Other

## 2017-08-11 DIAGNOSIS — A419 Sepsis, unspecified organism: Principal | ICD-10-CM

## 2017-08-11 DIAGNOSIS — Z9889 Other specified postprocedural states: Secondary | ICD-10-CM

## 2017-08-11 DIAGNOSIS — J9601 Acute respiratory failure with hypoxia: Secondary | ICD-10-CM

## 2017-08-11 DIAGNOSIS — R198 Other specified symptoms and signs involving the digestive system and abdomen: Secondary | ICD-10-CM

## 2017-08-11 DIAGNOSIS — R6521 Severe sepsis with septic shock: Secondary | ICD-10-CM

## 2017-08-11 LAB — POCT I-STAT 3, ART BLOOD GAS (G3+)
ACID-BASE DEFICIT: 9 mmol/L — AB (ref 0.0–2.0)
Acid-base deficit: 11 mmol/L — ABNORMAL HIGH (ref 0.0–2.0)
BICARBONATE: 15.4 mmol/L — AB (ref 20.0–28.0)
Bicarbonate: 17.8 mmol/L — ABNORMAL LOW (ref 20.0–28.0)
O2 SAT: 93 %
O2 Saturation: 87 %
PCO2 ART: 32.4 mmHg (ref 32.0–48.0)
PH ART: 7.264 — AB (ref 7.350–7.450)
PO2 ART: 69 mmHg — AB (ref 83.0–108.0)
PO2 ART: 53 mmHg — AB (ref 83.0–108.0)
Patient temperature: 95.9
Patient temperature: 95.5
TCO2: 19 mmol/L — ABNORMAL LOW (ref 22–32)
TCO2: 16 mmol/L — ABNORMAL LOW (ref 22–32)
pCO2 arterial: 38.7 mmHg (ref 32.0–48.0)
pH, Arterial: 7.277 — ABNORMAL LOW (ref 7.350–7.450)

## 2017-08-11 LAB — POCT I-STAT 7, (LYTES, BLD GAS, ICA,H+H)
Acid-base deficit: 15 mmol/L — ABNORMAL HIGH (ref 0.0–2.0)
Acid-base deficit: 4 mmol/L — ABNORMAL HIGH (ref 0.0–2.0)
Acid-base deficit: 12 mmol/L — ABNORMAL HIGH (ref 0.0–2.0)
BICARBONATE: 14.3 mmol/L — AB (ref 20.0–28.0)
BICARBONATE: 23.1 mmol/L (ref 20.0–28.0)
Bicarbonate: 14.5 mmol/L — ABNORMAL LOW (ref 20.0–28.0)
CALCIUM ION: 1.07 mmol/L — AB (ref 1.15–1.40)
CALCIUM ION: 1.21 mmol/L (ref 1.15–1.40)
CALCIUM ION: 1.07 mmol/L — AB (ref 1.15–1.40)
HCT: 29 % — ABNORMAL LOW (ref 36.0–46.0)
HEMATOCRIT: 22 % — AB (ref 36.0–46.0)
HEMATOCRIT: 31 % — AB (ref 36.0–46.0)
HEMOGLOBIN: 7.5 g/dL — AB (ref 12.0–15.0)
Hemoglobin: 9.9 g/dL — ABNORMAL LOW (ref 12.0–15.0)
Hemoglobin: 10.5 g/dL — ABNORMAL LOW (ref 12.0–15.0)
O2 Saturation: 100 %
O2 Saturation: 87 %
O2 Saturation: 95 %
PCO2 ART: 47.4 mmHg (ref 32.0–48.0)
PH ART: 7.285 — AB (ref 7.350–7.450)
PO2 ART: 73 mmHg — AB (ref 83.0–108.0)
POTASSIUM: 2.9 mmol/L — AB (ref 3.5–5.1)
POTASSIUM: 3.5 mmol/L (ref 3.5–5.1)
Patient temperature: 34.3
Potassium: 3.5 mmol/L (ref 3.5–5.1)
SODIUM: 152 mmol/L — AB (ref 135–145)
SODIUM: 148 mmol/L — AB (ref 135–145)
Sodium: 147 mmol/L — ABNORMAL HIGH (ref 135–145)
TCO2: 16 mmol/L — ABNORMAL LOW (ref 22–32)
TCO2: 25 mmol/L (ref 22–32)
TCO2: 16 mmol/L — AB (ref 22–32)
pCO2 arterial: 47.1 mmHg (ref 32.0–48.0)
pCO2 arterial: 31.3 mmHg — ABNORMAL LOW (ref 32.0–48.0)
pH, Arterial: 7.088 — CL (ref 7.350–7.450)
pH, Arterial: 7.258 — ABNORMAL LOW (ref 7.350–7.450)
pO2, Arterial: 285 mmHg — ABNORMAL HIGH (ref 83.0–108.0)
pO2, Arterial: 74 mmHg — ABNORMAL LOW (ref 83.0–108.0)

## 2017-08-11 LAB — BASIC METABOLIC PANEL
ANION GAP: 8 (ref 5–15)
BUN: 28 mg/dL — ABNORMAL HIGH (ref 8–23)
CALCIUM: 6.9 mg/dL — AB (ref 8.9–10.3)
CO2: 20 mmol/L — ABNORMAL LOW (ref 22–32)
CREATININE: 1.19 mg/dL — AB (ref 0.44–1.00)
Chloride: 119 mmol/L — ABNORMAL HIGH (ref 98–111)
GFR calc Af Amer: 49 mL/min — ABNORMAL LOW (ref >60)
GFR, EST NON AFRICAN AMERICAN: 43 mL/min — AB (ref >60)
GLUCOSE: 216 mg/dL — AB (ref 70–99)
Potassium: 3.8 mmol/L (ref 3.5–5.1)
Sodium: 147 mmol/L — ABNORMAL HIGH (ref 135–145)

## 2017-08-11 LAB — URINALYSIS, COMPLETE (UACMP) WITH MICROSCOPIC
Glucose, UA: NEGATIVE mg/dL
Ketones, ur: NEGATIVE mg/dL
NITRITE: NEGATIVE
PH: 5 (ref 5.0–8.0)
Protein, ur: 30 mg/dL — AB
Specific Gravity, Urine: 1.019 (ref 1.005–1.030)
WBC, UA: >50 WBC/hpf — ABNORMAL HIGH (ref 0–5)

## 2017-08-11 LAB — PROCALCITONIN: Procalcitonin: 45.55 ng/mL

## 2017-08-11 LAB — POCT I-STAT 4, (NA,K, GLUC, HGB,HCT)
Glucose, Bld: 119 mg/dL — ABNORMAL HIGH (ref 70–99)
HEMATOCRIT: 22 % — AB (ref 36.0–46.0)
HEMOGLOBIN: 7.5 g/dL — AB (ref 12.0–15.0)
Potassium: 2.9 mmol/L — ABNORMAL LOW (ref 3.5–5.1)
SODIUM: 151 mmol/L — AB (ref 135–145)

## 2017-08-11 LAB — CBC
HCT: 43.5 % (ref 36.0–46.0)
Hemoglobin: 13.6 g/dL (ref 12.0–15.0)
MCH: 27.1 pg (ref 26.0–34.0)
MCHC: 31.3 g/dL (ref 30.0–36.0)
MCV: 86.8 fL (ref 78.0–100.0)
PLATELETS: 243 10*3/uL (ref 150–400)
RBC: 5.01 MIL/uL (ref 3.87–5.11)
RDW: 17.9 % — AB (ref 11.5–15.5)
WBC: 5 10*3/uL (ref 4.0–10.5)

## 2017-08-11 LAB — GLUCOSE, CAPILLARY
GLUCOSE-CAPILLARY: 182 mg/dL — AB (ref 70–99)
GLUCOSE-CAPILLARY: 186 mg/dL — AB (ref 70–99)
GLUCOSE-CAPILLARY: 188 mg/dL — AB (ref 70–99)
GLUCOSE-CAPILLARY: 75 mg/dL (ref 70–99)
Glucose-Capillary: 130 mg/dL — ABNORMAL HIGH (ref 70–99)
Glucose-Capillary: 135 mg/dL — ABNORMAL HIGH (ref 70–99)
Glucose-Capillary: 72 mg/dL (ref 70–99)

## 2017-08-11 LAB — MRSA PCR SCREENING: MRSA by PCR: NEGATIVE

## 2017-08-11 LAB — MAGNESIUM: Magnesium: 1.5 mg/dL — ABNORMAL LOW (ref 1.7–2.4)

## 2017-08-11 LAB — CORTISOL: CORTISOL PLASMA: 14.2 ug/dL

## 2017-08-11 LAB — LACTIC ACID, PLASMA
LACTIC ACID, VENOUS: 3.5 mmol/L — AB (ref 0.5–1.9)
Lactic Acid, Venous: 4.4 mmol/L (ref 0.5–1.9)

## 2017-08-11 LAB — TRIGLYCERIDES: TRIGLYCERIDES: 34 mg/dL (ref <150)

## 2017-08-11 MED ORDER — FENTANYL 2500MCG IN NS 250ML (10MCG/ML) PREMIX INFUSION
25.0000 ug/h | INTRAVENOUS | Status: DC
  Administered 2017-08-11: 50 ug/h via INTRAVENOUS
  Administered 2017-08-12: 75 ug/h via INTRAVENOUS
  Administered 2017-08-13 – 2017-08-14 (×2): 150 ug/h via INTRAVENOUS
  Administered 2017-08-15: 50 ug/h via INTRAVENOUS
  Administered 2017-08-18: 25 ug/h via INTRAVENOUS
  Filled 2017-08-11 (×6): qty 250

## 2017-08-11 MED ORDER — ORAL CARE MOUTH RINSE
15.0000 mL | OROMUCOSAL | Status: DC
  Administered 2017-08-11 – 2017-08-20 (×89): 15 mL via OROMUCOSAL

## 2017-08-11 MED ORDER — INSULIN ASPART 100 UNIT/ML ~~LOC~~ SOLN
0.0000 [IU] | SUBCUTANEOUS | Status: DC
  Administered 2017-08-11: 2 [IU] via SUBCUTANEOUS

## 2017-08-11 MED ORDER — NOREPINEPHRINE 16 MG/250ML-% IV SOLN
0.0000 ug/min | INTRAVENOUS | Status: DC
  Administered 2017-08-11: 30 ug/min via INTRAVENOUS
  Administered 2017-08-12: 28 ug/min via INTRAVENOUS
  Administered 2017-08-12: 15 ug/min via INTRAVENOUS
  Administered 2017-08-13: 9 ug/min via INTRAVENOUS
  Filled 2017-08-11 (×5): qty 250

## 2017-08-11 MED ORDER — PIPERACILLIN-TAZOBACTAM 3.375 G IVPB 30 MIN
3.3750 g | Freq: Once | INTRAVENOUS | Status: AC
  Administered 2017-08-11: 3.375 g via INTRAVENOUS
  Filled 2017-08-11: qty 50

## 2017-08-11 MED ORDER — ENOXAPARIN SODIUM 40 MG/0.4ML ~~LOC~~ SOLN: 40.0000 mg | SUBCUTANEOUS | Status: DC

## 2017-08-11 MED ORDER — SODIUM BICARBONATE 8.4 % IV SOLN
50.0000 meq | Freq: Once | INTRAVENOUS | Status: AC
  Administered 2017-08-11: 50 meq via INTRAVENOUS
  Filled 2017-08-11: qty 50

## 2017-08-11 MED ORDER — HYDROCORTISONE NA SUCCINATE PF 100 MG IJ SOLR
50.0000 mg | Freq: Four times a day (QID) | INTRAMUSCULAR | Status: DC
  Administered 2017-08-11 – 2017-08-13 (×10): 50 mg via INTRAVENOUS
  Filled 2017-08-11 (×10): qty 2

## 2017-08-11 MED ORDER — FENTANYL CITRATE (PF) 100 MCG/2ML IJ SOLN
50.0000 ug | Freq: Once | INTRAMUSCULAR | Status: AC
  Administered 2017-08-11: 50 ug via INTRAVENOUS

## 2017-08-11 MED ORDER — SODIUM CHLORIDE 0.9 % IV SOLN
1.0000 g | Freq: Once | INTRAVENOUS | Status: AC
  Administered 2017-08-11: 1 g via INTRAVENOUS
  Filled 2017-08-11: qty 10

## 2017-08-11 MED ORDER — POTASSIUM CHLORIDE 10 MEQ/100ML IV SOLN
10.0000 meq | INTRAVENOUS | Status: AC
  Administered 2017-08-11 (×3): 10 meq via INTRAVENOUS
  Filled 2017-08-11 (×3): qty 100

## 2017-08-11 MED ORDER — FENTANYL BOLUS VIA INFUSION
25.0000 ug | INTRAVENOUS | Status: DC | PRN
  Administered 2017-08-11 – 2017-08-19 (×8): 25 ug via INTRAVENOUS
  Filled 2017-08-11: qty 25

## 2017-08-11 MED ORDER — ALBUTEROL SULFATE (2.5 MG/3ML) 0.083% IN NEBU: 2.5000 mg | INHALATION_SOLUTION | RESPIRATORY_TRACT | Status: DC | PRN

## 2017-08-11 MED ORDER — VANCOMYCIN HCL IN DEXTROSE 750-5 MG/150ML-% IV SOLN
750.0000 mg | INTRAVENOUS | Status: AC
  Administered 2017-08-11 – 2017-08-17 (×7): 750 mg via INTRAVENOUS
  Filled 2017-08-11 (×7): qty 150

## 2017-08-11 MED ORDER — MIDAZOLAM HCL 2 MG/2ML IJ SOLN
1.0000 mg | INTRAMUSCULAR | Status: DC | PRN
  Filled 2017-08-11 (×2): qty 2

## 2017-08-11 MED ORDER — INSULIN ASPART 100 UNIT/ML ~~LOC~~ SOLN
2.0000 [IU] | SUBCUTANEOUS | Status: DC
  Administered 2017-08-11 (×3): 4 [IU] via SUBCUTANEOUS
  Administered 2017-08-11 – 2017-08-15 (×9): 2 [IU] via SUBCUTANEOUS
  Administered 2017-08-16 (×2): 4 [IU] via SUBCUTANEOUS
  Administered 2017-08-16 – 2017-08-17 (×3): 2 [IU] via SUBCUTANEOUS
  Administered 2017-08-17 (×2): 4 [IU] via SUBCUTANEOUS
  Administered 2017-08-17 (×2): 2 [IU] via SUBCUTANEOUS
  Administered 2017-08-18: 4 [IU] via SUBCUTANEOUS
  Administered 2017-08-18: 6 [IU] via SUBCUTANEOUS

## 2017-08-11 MED ORDER — PROPOFOL 1000 MG/100ML IV EMUL: 5.0000 ug/kg/min | INTRAVENOUS | Status: DC

## 2017-08-11 MED ORDER — METOPROLOL TARTRATE 5 MG/5ML IV SOLN: 5.0000 mg | Freq: Four times a day (QID) | INTRAVENOUS | Status: DC | PRN

## 2017-08-11 MED ORDER — FAMOTIDINE IN NACL 20-0.9 MG/50ML-% IV SOLN
20.0000 mg | Freq: Two times a day (BID) | INTRAVENOUS | Status: DC
  Administered 2017-08-11 – 2017-08-12 (×5): 20 mg via INTRAVENOUS
  Filled 2017-08-11 (×6): qty 50

## 2017-08-11 MED ORDER — HYDRALAZINE HCL 20 MG/ML IJ SOLN: 10.0000 mg | INTRAMUSCULAR | Status: DC | PRN

## 2017-08-11 MED ORDER — PIPERACILLIN-TAZOBACTAM 3.375 G IVPB
3.3750 g | Freq: Three times a day (TID) | INTRAVENOUS | Status: AC
  Administered 2017-08-11 – 2017-08-17 (×20): 3.375 g via INTRAVENOUS
  Filled 2017-08-11 (×23): qty 50

## 2017-08-11 MED ORDER — CHLORHEXIDINE GLUCONATE 0.12% ORAL RINSE (MEDLINE KIT)
15.0000 mL | Freq: Two times a day (BID) | OROMUCOSAL | Status: DC
  Administered 2017-08-11 – 2017-08-19 (×18): 15 mL via OROMUCOSAL

## 2017-08-11 MED ORDER — LACTATED RINGERS IV BOLUS
1000.0000 mL | Freq: Once | INTRAVENOUS | Status: AC
  Administered 2017-08-11: 1000 mL via INTRAVENOUS

## 2017-08-11 MED ORDER — PHENYLEPHRINE HCL-NACL 10-0.9 MG/250ML-% IV SOLN
0.0000 ug/min | INTRAVENOUS | Status: DC
  Filled 2017-08-11: qty 250

## 2017-08-11 MED ORDER — HEPARIN SODIUM (PORCINE) 5000 UNIT/ML IJ SOLN
5000.0000 [IU] | Freq: Three times a day (TID) | INTRAMUSCULAR | Status: DC
  Administered 2017-08-11 – 2017-08-15 (×13): 5000 [IU] via SUBCUTANEOUS
  Filled 2017-08-11 (×12): qty 1

## 2017-08-11 NOTE — Progress Notes (Addendum)
Patient ID: Judith [name], female   DOB: [birth year], 77 y.o.   MRN: [id number]    1 Day Post-Op  Subjective: Pt sedated on vent, but does barely open her eyes to her name being called.  Objective: Vital signs in last 24 hours: Temp:  [94.2 F (34.6 C)-98.7 F (37.1 C)] 94.2 F (34.6 C) (07/17 0408) Pulse Rate:  [82-109] 93 (07/17 0600) Resp:  [15-37] 15 (07/17 0600) BP: (56-174)/(35-71) 95/64 (07/17 0600) SpO2:  [86 %-98 %] 96 % (07/17 0729) Arterial Line BP: (92-128)/(52-61) 106/57 (07/17 0600) FiO2 (%):  [40 %-50 %] 50 % (07/17 0729) Last BM Date: 08/11/17  Intake/Output from previous day: 07/16 0701 - 07/17 0700 In: 5855.9 [I.V.:2774.4; Blood:630; IV Piggyback:2451.5] Out: 495 [Urine:375; Stool:20; Blood:100] Intake/Output this shift: No intake/output data recorded.  PE: Gen: critically ill on vent Heart: regular Lungs: CTAB on vent Abd: soft, absent BS, transverse colostomy with no output yet.  Stoma is pink and viable.  Mucus fistula present with feculent output.  Midline wound is open and packed.  Lab Results:  Recent Labs    07/28/2017 1946  07/27/2017 2325 08/11/17 0500  WBC 12.8*  --   --  5.0  HGB 11.3*   < > 10.5* 13.6  HCT 38.3   < > 31.0* 43.5  PLT 290  --   --  243   < > = values in this interval not displayed.   BMET Recent Labs    08/23/2017 1946  08/24/2017 2325 08/11/17 0500  NA 141   < > 148* 147*  K 3.4*   < > 3.5 3.8  CL 112*  --   --  119*  CO2 14*  --   --  20*  GLUCOSE 281*  --   --  216*  BUN 33*  --   --  28*  CREATININE 1.60*  --   --  1.19*  CALCIUM 8.5*  --   --  6.9*   < > = values in this interval not displayed.   PT/INR Recent Labs    08/25/2017 1946  LABPROT 16.3*  INR 1.33   CMP     Component Value Date/Time   NA 147 (H) 08/11/2017 0500   K 3.8 08/11/2017 0500   CL 119 (H) 08/11/2017 0500   CO2 20 (L) 08/11/2017 0500   GLUCOSE 216 (H) 08/11/2017 0500   BUN 28 (H) 08/11/2017 0500   CREATININE 1.19 (H) 08/11/2017 0500   CALCIUM 6.9 (L) 08/11/2017 0500   PROT 6.0 (L) 08/21/2017 1946   ALBUMIN 2.5 (L) 08/04/2017 1946   AST 18 08/06/2017 1946   ALT 12 08/19/2017 1946   ALKPHOS 74 08/24/2017 1946   BILITOT 0.5 08/14/2017 1946   GFRNONAA 43 (L) 08/11/2017 0500   GFRAA 49 (L) 08/11/2017 0500   Lipase     Component Value Date/Time   LIPASE 70 (H) 08/04/2017 1954       Studies/Results: Ct Abdomen Pelvis Wo Contrast  Result Date: 07/30/2017 CLINICAL DATA:  Abdominal distension.  Follow-up pneumoperitoneum. EXAM: CT ABDOMEN AND PELVIS WITHOUT CONTRAST TECHNIQUE: Multidetector CT imaging of the abdomen and pelvis was performed following the standard protocol without IV contrast. COMPARISON:  Abdominal radiograph August 10, 2016 and CT abdomen and pelvis August 01, 2015. FINDINGS: LOWER CHEST: Bilateral lower lobe consolidation. Heart size is normal. Moderate coronary artery calcifications. No pericardial effusion. HEPATOBILIARY: Normal. PANCREAS: Normal. SPLEEN: Normal. ADRENALS/URINARY TRACT: Kidneys are orthotopic, demonstrating normal size and morphology. No nephrolithiasis,  hydronephrosis; limited assessment for renal masses by nonenhanced CT. 2.5 cm dense (70 Hounsfield units) RIGHT lower pole proteinaceous/hemorrhagic cyst, no routine indicated follow-up by consensus. 9 mm dense RIGHT interpolar cyst. Isodense probable mass lower pole LEFT kidney. All renal masses appear relatively stable from prior contrast enhanced CT. The unopacified ureters are normal in course and caliber. Urinary bladder is partially distended and unremarkable. Normal adrenal glands. STOMACH/BOWEL: Moderate hiatal hernia. Large amount of stool in sigmoid colon through the level of the anus, rectum distended to 9.1 cm. At proximal sigmoid colon is a linear intramural lucency concerning for perforation (coronal image 32), associated circumferential focal mural thickening. Adjacent feculent appearing material in adjacent peritoneum. Moderate colonic  diverticulosis. Debris distended stomach. Normal appendix. VASCULAR/LYMPHATIC: Aortoiliac vessels are normal in course and caliber. Moderate to severe calcific atherosclerosis. No lymphadenopathy by CT size criteria. REPRODUCTIVE: Normal. OTHER: Small volume ascites LEFT upper quadrant. Large volume pneumoperitoneum. MUSCULOSKELETAL: Non-acute. IMPRESSION: 1. Large volume pneumoperitoneum. Probable sigmoid bowel perforation with adjacent probable stool in peritoneal cavity. Ulcerated tumor not excluded. 2. Large volume rectosigmoid stool, rectum distended to 9.1 cm. 3. Bilateral lower lobe consolidation concerning for pneumonia. 4. Critical Value/emergent results were called by telephone at the time of interpretation on 08/13/2017 at 8:50 pm to Dr. Dorie Rank , who verbally acknowledged these results. Electronically Signed   By: Elon Alas M.D.   On: 08/16/2017 20:54   Ct Head Wo Contrast  Result Date: 08/09/2017 CLINICAL DATA:  Altered mental status from baseline. EXAM: CT HEAD WITHOUT CONTRAST TECHNIQUE: Contiguous axial images were obtained from the base of the skull through the vertex without intravenous contrast. COMPARISON:  04/23/2017 FINDINGS: Brain: Subtle hypodensity involving the left pons and midbrain may represent nonhemorrhagic infarcts, apparently new since prior comparison on 04/23/2017. Atrophy with chronic stable small vessel ischemic disease of periventricular white matter. No acute intracranial hemorrhage, midline shift or edema. No large vascular territory infarct. No intra-axial mass nor extra-axial fluid collections. Midline fourth ventricle and basal cisterns without effacement. Vascular: Hyperdense vessel sign. Skull: No acute skull fracture Sinuses/Orbits: Bilateral lens replacements. Clear paranasal sinuses. Other: Clear mastoids IMPRESSION: 1. Chronic appearing small vessel ischemic disease of periventricular white matter with atrophy. 2. Age indeterminate but possibly new since  prior exam are subtle hypodensities in the left pons and midbrain suspicious for small nonhemorrhagic infarcts. Electronically Signed   By: Ashley Royalty M.D.   On: 08/09/2017 20:45   Dg Chest Port 1 View  Result Date: 08/11/2017 CLINICAL DATA:  77 y/o F; post intubation and central line placement. EXAM: PORTABLE CHEST 1 VIEW COMPARISON:  08/12/2017 chest radiograph FINDINGS: Stable cardiomegaly given projection and technique. No appreciable pneumoperitoneum. Stable bibasilar opacities and small effusions. Endotracheal tube tip projects 3.7 cm above the carina. Right central venous catheter tip projects over right cavoatrial junction. Enteric tube tip extends below field of view into the abdomen. Bones are unremarkable. IMPRESSION: 1. Stable bibasilar atelectasis and small effusions. 2. Endotracheal tube tip 3.7 cm above the carina. 3. Enteric tube tip extends below field of view into abdomen. 4. Right central venous catheter tip projects over cavoatrial junction. Electronically Signed   By: Kristine Garbe M.D.   On: 08/11/2017 02:05   Dg Chest Port 1 View  Result Date: 08/01/2017 CLINICAL DATA:  Worsening altered mental status. History of dementia. EXAM: PORTABLE CHEST 1 VIEW COMPARISON:  Chest radiograph April 23, 2017 FINDINGS: Air underneath bilateral hemidiaphragms. Bibasilar atelectasis. Stable cardiomegaly. Calcified aortic arch. No pneumothorax. Osteopenia. IMPRESSION:  1. Pneumoperitoneum. 2. Stable cardiomegaly.  Bibasilar atelectasis/scarring. 3.  Aortic Atherosclerosis (ICD10-I70.0). 4. Critical Value/emergent results were called by telephone at the time of interpretation on 08/17/2017 at 7:23 pm to Dr. Dorie Rank , who verbally acknowledged these results. Electronically Signed   By: Elon Alas M.D.   On: 08/18/2017 19:24    Anti-infectives: Anti-infectives (From admission, onward)   Start     Dose/Rate Route Frequency Ordered Stop   08/11/17 0600  piperacillin-tazobactam  (ZOSYN) IVPB 3.375 g     3.375 g 12.5 mL/hr over 240 Minutes Intravenous Every 8 hours 08/11/17 0025     08/11/17 0115  vancomycin (VANCOCIN) IVPB 750 mg/150 ml premix     750 mg 150 mL/hr over 60 Minutes Intravenous Every 24 hours 08/11/17 0110     08/11/17 0045  piperacillin-tazobactam (ZOSYN) IVPB 3.375 g     3.375 g 100 mL/hr over 30 Minutes Intravenous  Once 08/11/17 0033 08/11/17 0206   08/19/2017 2000  piperacillin-tazobactam (ZOSYN) IVPB 3.375 g  Status:  Discontinued     3.375 g 100 mL/hr over 30 Minutes Intravenous  Once 08/09/2017 1952 08/11/17 0025       Assessment/Plan Perforated proximal descending colon POD 0, s/p extended left hemicolectomy with distal transverse colostomy and mucous fistula creation. -cont NGT for now secondary to likely post op ileus -BID midline dressing changes -WOC RN for ostomy care -cont zosyn and vanc -follow labs  Sepsis -secondary to #1 -lactic acid still hasn't cleared.  3.5 still this morning, but down from over 6 on admit.  Cont fluid resuscitation. -still on vaso and norepi for pressor support, defer to CCM -cortisol is normal.  On prophylactic solu-cortef.  Would ideally like to minimize or stop this if not warranted to decrease risk wound breakdown etc.  VDRF -may wean to extubate per CCM  DM -SSI for now  HTN -patient is currently hypotensive secondary to septic shock.  Hold all antihypertensive meds for now.  FEN - NPO, IVFs, NGT  VTE - heparin 5000, SCDs ID - Zosyn 7/16-->  Vanc 7/16-->   LOS: 1 day    Henreitta Cea , [facility] 08/11/2017, 7:51 AM Pager: [phone]

## 2017-08-11 NOTE — Progress Notes (Signed)
Pharmacy Antibiotic Note  Judith [name] is a 77 y.o. female admitted on 08/09/2017 with perforated colon.  Pharmacy has been consulted for Vancomycin/Zosyn dosing. WBC 12.8. Noted renal dysfunction. Pt now post-op.   Plan: Vancomycin 750 mg IV q24h Zosyn 3.375G IV q8h to be infused over 4 hours Trend WBC, temp, renal function  F/U infectious work-up Drug levels as indicated   Height: 5' (152.4 cm) IBW/kg (Calculated) : 45.5  Temp (24hrs), Avg:98.7 F (37.1 C), Min:98.7 F (37.1 C), Max:98.7 F (37.1 C)  Recent Labs  Lab 07/26/2017 1946 08/01/2017 2011  WBC 12.8*  --   CREATININE 1.60*  --   LATICACIDVEN  --  6.41*    CrCl cannot be calculated (Unknown ideal weight.).    No Known Allergies   Judith [name] 08/11/2017 1:04 AM

## 2017-08-11 NOTE — Consult Note (Signed)
PULMONARY / CRITICAL CARE MEDICINE   Name: Judith [name] MRN: [id number] DOB: 01/17/1941    ADMISSION DATE:  08/20/2017 CONSULTATION DATE:  08/11/17  REFERRING MD:  Kae Heller  CHIEF COMPLAINT:  Vent Management  HISTORY OF PRESENT ILLNESS:  Pt is encephelopathic; therefore, this HPI is obtained from chart review. Judith [name] is a 77 y.o. female with PMH as outlined below and who resides at nursing home.  She was brought to [facility] ED 7/16 after staff at nursing home found her with AMS.  She had been asymptomatic prior to this. In ED, she had CXR and CT A / P which showed large amount of pneumoperitoneum.  She also had hypotension that was slow to respond to IVF's. She was taken to OR by surgery where she had ex-lap which revealed perforated proximal descending colon.  She has an extended left hemicolectomy including mobilization of splenic flexure, creation of colostomy and mucous fistula.  Post OR, she returned to the ICU where she remained ventilated and on low dose pressors.  PCCM was asked to assist with vent management.  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  No Known Allergies  No current facility-administered  medications on file prior to encounter.    Current Outpatient Medications on File Prior to Encounter  Medication Sig  . acetaminophen (TYLENOL) 650 MG CR tablet Take 650 mg by mouth every 4 (four) hours as needed for pain.  . Amino Acids-Protein Hydrolys (FEEDING SUPPLEMENT, PRO-STAT SUGAR FREE 64,) LIQD Take 30 mLs by mouth 2 (two) times daily.  Marland Kitchen aspirin 325 MG tablet Take 325 mg by mouth daily with breakfast.   . atorvastatin (LIPITOR) 20 MG tablet Take 20 mg by mouth every evening.   . cloNIDine (CATAPRES) 0.1 MG tablet Take 0.1 mg by mouth 2 (two) times daily.  Marland Kitchen Dextrose, Diabetic Use, (INSTA-GLUCOSE) 77.4 % GEL Take 1 vial by mouth daily as needed (Diabetes, Give when CBG is <60).  . insulin aspart (NOVOLOG) 100 UNIT/ML injection Inject 0-15 Units into the skin 3 (three) times daily before meals. Per sliding scale   0-150 = 0 units 151-200 = 3 units 201-250 = 5 units 251-300 = 7 units 301-350 = 9 units 351-400 = 11 units 401-450 = 13 units 451-500 = 15 units and call provider  . insulin detemir (LEVEMIR) 100 UNIT/ML injection Inject 0.06 mLs (6 Units total) into the skin daily.  Marland Kitchen LORazepam (ATIVAN) 0.5 MG tablet Take 1 tablet (0.5 mg total) by mouth every 6 (six) hours as needed for anxiety.  . magnesium hydroxide (MILK OF MAGNESIA) 400 MG/5ML suspension Take 30 mLs by mouth daily as needed for mild constipation.  . megestrol (MEGACE) 400 MG/10ML suspension Take 800 mg by mouth daily.  . metoprolol succinate (TOPROL-XL) 50 MG 24 hr tablet Take 75 mg by mouth daily with breakfast. Take with or immediately following a meal.   .  mirtazapine (REMERON) 7.5 MG tablet Take 7.5 mg by mouth every other day.  . polyethylene glycol (MIRALAX / GLYCOLAX) packet Take 17 g by mouth daily. (Patient taking differently: Take 17 g by mouth daily with breakfast. )  . promethazine (PHENERGAN) 25 MG tablet Take 25 mg by mouth every 6 (six) hours as needed for nausea or vomiting.  . ranitidine (ZANTAC)  150 MG tablet Take 150 mg by mouth every evening.   . vitamin C (ASCORBIC ACID) 500 MG tablet Take 500 mg by mouth 2 (two) times daily.  Marland Kitchen zinc gluconate 50 MG tablet Take 50 mg by mouth daily with breakfast.    FAMILY HISTORY:  Her indicated that the status of her mother is unknown. She indicated that the status of her father is unknown. She indicated that the status of her sister is unknown. She indicated that the status of her brother is unknown. She indicated that the status of her neg hx is unknown.   SOCIAL HISTORY: She  reports that she quit smoking about 24 years ago. She has a 5.00 pack-year smoking history. She has quit using smokeless tobacco. She reports that she does not drink alcohol or use drugs.  REVIEW OF SYSTEMS:  Unable to obtain as pt is encephalopathic.  SUBJECTIVE:  On vent, sedated.  VITAL SIGNS: BP (!) 122/55   Pulse 100   Temp 98.7 F (37.1 C) (Rectal)   Resp 15   Ht 5' (1.524 m)   SpO2 95%   BMI 33.84 kg/m   HEMODYNAMICS:    VENTILATOR SETTINGS: Vent Mode: PRVC FiO2 (%):  [40 %] 40 % Set Rate:  [15 bmp] 15 bmp Vt Set:  [360 mL] 360 mL PEEP:  [5 cmH20] 5 cmH20 Plateau Pressure:  [13 cmH20] 13 cmH20  INTAKE / OUTPUT: No intake/output data recorded.   PHYSICAL EXAMINATION: General: Adult female, resting in bed, in NAD. Neuro: Sedated, non-responsive. HEENT: /AT. Sclerae anicteric. ETT in place. Cardiovascular: RRR, no M/R/G.  Lungs: Respirations even and unlabored.  CTA bilaterally, No W/R/R. Abdomen: Midline dressings C/D/I.  Colostomy bag in place with loose brown stool.  BS hypoactive.  Abd soft, NT/ND.  Musculoskeletal: R BKA, no edema.  Skin: Intact, warm, no rashes.   LABS:  BMET Recent Labs  Lab 07/29/2017 1946 08/03/2017 2140 08/20/2017 2215 08/17/2017 2325  NA 141 147* 152* 148*  K 3.4* 3.5 2.9* 3.5  CL 112*  --   --   --   CO2 14*  --   --   --   BUN 33*  --   --   --   CREATININE 1.60*  --   --   --   GLUCOSE 281*  --    --   --     Electrolytes Recent Labs  Lab 08/11/2017 1946  CALCIUM 8.5*    CBC Recent Labs  Lab 08/16/2017 1946 08/02/2017 2140 08/06/2017 2215 07/26/2017 2325  WBC 12.8*  --   --   --   HGB 11.3* 9.9* 7.5* 10.5*  HCT 38.3 29.0* 22.0* 31.0*  PLT 290  --   --   --     Coag's Recent Labs  Lab 07/31/2017 1946  APTT 22*  INR 1.33    Sepsis Markers Recent Labs  Lab 08/23/2017 2011  LATICACIDVEN 6.41*    ABG Recent Labs  Lab 08/06/2017 2140 08/03/2017 2215 08/21/2017 2325  PHART 7.088* 7.285* 7.258*  PCO2ART 47.4 47.1 31.3*  PO2ART 73.0* 285.0* 74.0*  Liver Enzymes Recent Labs  Lab 08/18/2017 1946  AST 18  ALT 12  ALKPHOS 74  BILITOT 0.5  ALBUMIN 2.5*    Cardiac Enzymes No results for input(s): TROPONINI, PROBNP in the last 168 hours.  Glucose Recent Labs  Lab 08/09/2017 1957 08/11/17 0030  GLUCAP 226* 130*    Imaging Ct Abdomen Pelvis Wo Contrast  Result Date: 08/09/2017 CLINICAL DATA:  Abdominal distension.  Follow-up pneumoperitoneum. EXAM: CT ABDOMEN AND PELVIS WITHOUT CONTRAST TECHNIQUE: Multidetector CT imaging of the abdomen and pelvis was performed following the standard protocol without IV contrast. COMPARISON:  Abdominal radiograph August 10, 2016 and CT abdomen and pelvis August 01, 2015. FINDINGS: LOWER CHEST: Bilateral lower lobe consolidation. Heart size is normal. Moderate coronary artery calcifications. No pericardial effusion. HEPATOBILIARY: Normal. PANCREAS: Normal. SPLEEN: Normal. ADRENALS/URINARY TRACT: Kidneys are orthotopic, demonstrating normal size and morphology. No nephrolithiasis, hydronephrosis; limited assessment for renal masses by nonenhanced CT. 2.5 cm dense (70 Hounsfield units) RIGHT lower pole proteinaceous/hemorrhagic cyst, no routine indicated follow-up by consensus. 9 mm dense RIGHT interpolar cyst. Isodense probable mass lower pole LEFT kidney. All renal masses appear relatively stable from prior contrast enhanced CT. The unopacified  ureters are normal in course and caliber. Urinary bladder is partially distended and unremarkable. Normal adrenal glands. STOMACH/BOWEL: Moderate hiatal hernia. Large amount of stool in sigmoid colon through the level of the anus, rectum distended to 9.1 cm. At proximal sigmoid colon is a linear intramural lucency concerning for perforation (coronal image 32), associated circumferential focal mural thickening. Adjacent feculent appearing material in adjacent peritoneum. Moderate colonic diverticulosis. Debris distended stomach. Normal appendix. VASCULAR/LYMPHATIC: Aortoiliac vessels are normal in course and caliber. Moderate to severe calcific atherosclerosis. No lymphadenopathy by CT size criteria. REPRODUCTIVE: Normal. OTHER: Small volume ascites LEFT upper quadrant. Large volume pneumoperitoneum. MUSCULOSKELETAL: Non-acute. IMPRESSION: 1. Large volume pneumoperitoneum. Probable sigmoid bowel perforation with adjacent probable stool in peritoneal cavity. Ulcerated tumor not excluded. 2. Large volume rectosigmoid stool, rectum distended to 9.1 cm. 3. Bilateral lower lobe consolidation concerning for pneumonia. 4. Critical Value/emergent results were called by telephone at the time of interpretation on 08/01/2017 at 8:50 pm to Dr. Dorie Rank , who verbally acknowledged these results. Electronically Signed   By: Elon Alas M.D.   On: 08/14/2017 20:54   Ct Head Wo Contrast  Result Date: 08/12/2017 CLINICAL DATA:  Altered mental status from baseline. EXAM: CT HEAD WITHOUT CONTRAST TECHNIQUE: Contiguous axial images were obtained from the base of the skull through the vertex without intravenous contrast. COMPARISON:  04/23/2017 FINDINGS: Brain: Subtle hypodensity involving the left pons and midbrain may represent nonhemorrhagic infarcts, apparently new since prior comparison on 04/23/2017. Atrophy with chronic stable small vessel ischemic disease of periventricular white matter. No acute intracranial  hemorrhage, midline shift or edema. No large vascular territory infarct. No intra-axial mass nor extra-axial fluid collections. Midline fourth ventricle and basal cisterns without effacement. Vascular: Hyperdense vessel sign. Skull: No acute skull fracture Sinuses/Orbits: Bilateral lens replacements. Clear paranasal sinuses. Other: Clear mastoids IMPRESSION: 1. Chronic appearing small vessel ischemic disease of periventricular white matter with atrophy. 2. Age indeterminate but possibly new since prior exam are subtle hypodensities in the left pons and midbrain suspicious for small nonhemorrhagic infarcts. Electronically Signed   By: Ashley Royalty M.D.   On: 08/02/2017 20:45   Dg Chest Port 1 View  Result Date: 08/17/2017 CLINICAL DATA:  Worsening altered mental status. History of dementia. EXAM: PORTABLE CHEST 1 VIEW COMPARISON:  Chest radiograph April 23, 2017  FINDINGS: Air underneath bilateral hemidiaphragms. Bibasilar atelectasis. Stable cardiomegaly. Calcified aortic arch. No pneumothorax. Osteopenia. IMPRESSION: 1. Pneumoperitoneum. 2. Stable cardiomegaly.  Bibasilar atelectasis/scarring. 3.  Aortic Atherosclerosis (ICD10-I70.0). 4. Critical Value/emergent results were called by telephone at the time of interpretation on 08/24/2017 at 7:23 pm to Dr. Dorie Rank , who verbally acknowledged these results. Electronically Signed   By: Elon Alas M.D.   On: 08/23/2017 19:24     STUDIES:  CT head 7/16 > chronic small vessel ischemic disease. CT A / P 7/16 > large volume pneumoperitoneum, probable sigmoid perf, b/l lower lobe consolidation.  CULTURES: Blood 7/16 >  Sputum 7/17 >   ANTIBIOTICS: Vanc 7/17 >  Zosyn 7/17 >   SIGNIFICANT EVENTS: 7/16 > admit, to OR for ex-lap.  LINES/TUBES: ETT 7/16 >  R IJ CVL 7/16 >  L radial art line 7/16 >   DISCUSSION: 77 y.o. female from nursing home, admitted 7/16 with pneumoperitoneum.  Taken for ex-lap where she was found to have perforated proximal  descending colon which was repaired and colostomy performed.  ASSESSMENT / PLAN:  PULMONARY A: Respiratory insufficiency - remains intubated post op. Possible HCAP. Hx asthma. P:   Full vent support. Wean as able. VAP prevention measures. SBT in AM if able. See ID section. Albuterol PRN. CXR in AM.  CARDIOVASCULAR A:  Septic shock - due to peritonitis +/- HCAP.  A repeat sepsis assessment has been performed. Hx HTN, HLD. P:  Continue levophed and vasopressin as needed, goal MAP > 65. Assess cortisol and start empiric stress steroids. Will add phenylephrine if further support needed. If cortisol > 20, d/c stress roids. Trend lactate. Hold preadmission ASA, atorvastatin, clonidine, toprol-xl.  RENAL A:   Hypokalemia. AKI. AGMA - lactate. Hypocalcemia.  P:   3 runs K. NS @ 125. 1g Ca gluconate. BMP in AM.  GASTROINTESTINAL A:   Pneumoperitoneum - due to perforated descending colon.  S/p ex-lap with repair and creation of colostomy. GI prophylaxis. Nutrition. Hx peptic ulcers, GERD, colitis. P:   Post op care per CCS. SUP: Famotidine. NPO.  HEMATOLOGIC A:   VTE Prophylaxis. P:  SCD's / heparin. CBC in AM.  INFECTIOUS A:   Septic shock - due to peritonitis.  A repeat sepsis assessment has been performed. P:   Abx as above (vanc / zosyn). Can consider addition of eraxis.  Follow cultures as above. Follow PCT.  ENDOCRINE A:   Hx DM.  P:   SSI. Hold preadmission novolog, levemir.  NEUROLOGIC A:   Sedation needs due to mechanical ventilation. Hx anxiety. P:   Sedation:  Fentanyl gtt / Midazolam PRN. RASS goal: 0 to -1. Daily WUA. Hold preadmission lorazepam, mirtazapine.  Family updated: None available.  Interdisciplinary Family Meeting v Palliative Care Meeting:  Due by: 08/18/17.  CC time: 35 min.   Montey Hora, [place] Pager: [phone]  or [phone] 08/11/2017, 12:42  AM

## 2017-08-11 NOTE — Anesthesia Postprocedure Evaluation (Signed)
Anesthesia Post Note  Patient: Judith [name]  Procedure(s) Performed: EXPLORATORY LAPAROTOMY FOR FREE AIR, LEFT EXTENDED HEMICOLECTOMY, COLOSTOMY AND MUCUS FISTULA (N/A Abdomen)     Patient location during evaluation: ICU Anesthesia Type: General Level of consciousness: sedated Pain management: pain level controlled Vital Signs Assessment: post-procedure vital signs reviewed and stable Respiratory status: patient remains intubated per anesthesia plan Cardiovascular status: tachycardic Postop Assessment: no apparent nausea or vomiting Anesthetic complications: no Comments: Patient requiring vasopressor support in ICU bed 12.    Last Vitals:  Vitals:   08/11/17 0600 08/11/17 0729  BP: 95/64   Pulse: 93   Resp: 15   Temp:    SpO2: 98% 96%    Last Pain:  Vitals:   08/11/17 0408  TempSrc: Oral                 Judith [name] P Judith [name]

## 2017-08-11 NOTE — Op Note (Signed)
Operative Note  Judith [name]  [id number]  [id number]  08/11/2017   Surgeon: Victorino Sparrow ConnorMD  Assistant: OR staff  Procedure performed: Exploratory laparotomy, extended left hemicolectomy including mobilization of the splenic flexure,, creation of colostomy and mucous fistula.  Preop diagnosis: Pneumoperitoneum, septic shock Post-op diagnosis/intraop findings: Perforated proximal descending colon, extremely distended sigmoid colon and rectum. No palpable obstructing mass was present within the rectum or anal canal   Specimens: Extended left hemicolectomy Retained items: None EBL: 50 cc Complications: none  Description of procedure: After obtaining informed consent from her family, the patient was taken to the operating room and placed supine on operating room table wheregeneral endotracheal anesthesia was initiated, preoperative antibiotics were administered, SCDs applied, and a formal timeout was performed. A central line and arterial line were placed by anesthesia and a Foley catheter was inserted. The abdomen was prepped and draped in usual sterile fashion and then a midline laparotomy was created. The peritoneal cavity was entered bluntly and a rush of air as well as a fair amount of feculent purulent fluid was encountered. The descending, sigmoid and rectum were extremely dilated and boggy but narrowed just proximal to the splenic flexure, where there was a clear perforation and adhesion to the distal transverse colon. The stomach was inspected and found to be free of injury. The lesser sac was entered and the posterior stomach was also inspected. The small bowel was run from ligament of Treitz to the ileocecal valve and also found to be free of injury abnormality. The cecum and ascending colon and proximal transverse colon were normal. The uterus was unusually large for 77 year old woman and hyperemic but unclear thought was secondary to the inflammatory process here. Having  confirms an isolated perforation in the proximal descending colon, I elected to perform an extended left hemicolectomy to resect much of the redundant extremely dilated sigmoid colon as well as the perforation. The lateral attachments were divided with cautery and the splenic flexure was mobilized using cautery and blunt dissection. The omentum was taken off of the distal transverse colon. The distal transverse colon was divided with a blue load linear cutting stapler as was the distal sigmoid at a point where I felt the stapled end would be able to reach through the abdominal wall to perform a mucous fistula. The intervening mesentery was divided with the LigaSure device and the specimen was handed off to be sent for pathological examination. At this juncture the abdomen was irrigated with several liters of warm sterile saline until the effluent was clear. Hemostasis was ensured. A colostomy site was created in the left upper quadrant through which the distal transverse colon was brought out. Similarly a colostomy site was made in the left lower quadrant for mucous fistula and the corner of the rectal stump was brought out through this. The midline fascia was closed with running looped #1 PDS and the wound was packed with a wet-to-dry dressing. The left upper quadrant colostomy was matured in the standard fashion and brooked slightly. The mucous fistula was also matured directly to the skin edges. An ostomy appliance was placed on both of these. At this juncture a digital rectal examination was performed confirming that there was no obstructing mass or stricture to explain the patient's obstipation. Despite ongoing fluid resuscitation with packed red cells, albumin and crystalloid, the patient had an ongoing pressor requirement of 12 levofed and 0.06 of vaso. She will be kept intubated and will be taken to the intensive care unit for  ongoing resuscitation.  All counts were correct at the completion of the case.    The family including the patient's son, daughter, daughter-in-law and grandchildren were updated in person immediately following the procedure.

## 2017-08-11 NOTE — Progress Notes (Signed)
RT attempted to obtain sputum sample via ETT inline suction and was unable to get a sample for testing.

## 2017-08-11 NOTE — Progress Notes (Signed)
  RT Note:  Second attempt to obtain sputum sample. Unsuccessful. Will pass along to receiving RT.

## 2017-08-11 NOTE — Transfer of Care (Signed)
Immediate Anesthesia Transfer of Care Note  Patient: ALONI CHUANG  Procedure(s) Performed: EXPLORATORY LAPAROTOMY FOR FREE AIR, LEFT EXTENDED HEMICOLECTOMY, COLOSTOMY AND MUCUS FISTULA (N/A Abdomen)  Patient Location: ICU  Anesthesia Type:General  Level of Consciousness: sedated  Airway & Oxygen Therapy: Patient remains intubated per anesthesia plan and Patient placed on Ventilator (see vital sign flow sheet for setting)  Post-op Assessment: Report given to RN and Post -op Vital signs reviewed and stable  Post vital signs: Reviewed and stable  Last Vitals:  Vitals Value Taken Time  BP 113/55 08/11/2017 12:24 AM  Temp    Pulse 98 08/11/2017 12:29 AM  Resp 19 08/11/2017 12:29 AM  SpO2 99 % 08/11/2017 12:29 AM  Vitals shown include unvalidated device data.  Last Pain:  Vitals:   08/13/2017 1840  TempSrc: Rectal         Complications: No apparent anesthesia complications

## 2017-08-11 NOTE — Consult Note (Signed)
PULMONARY / CRITICAL CARE MEDICINE   Name: Judith [name] MRN: [id number] DOB: Jun 22, 1940    ADMISSION DATE:  08/22/2017 CONSULTATION DATE:  08/11/17  REFERRING MD:  Kae Heller  CHIEF COMPLAINT:  Vent Management  HISTORY OF PRESENT ILLNESS:  Pt is encephelopathic; therefore, this HPI is obtained from chart review. IRIDIAN READER is a 77 y.o. female with PMH as outlined below and who resides at nursing home.  She was brought to [facility] ED 7/16 after staff at nursing home found her with AMS.  She had been asymptomatic prior to this. In ED, she had CXR and CT A / P which showed large amount of pneumoperitoneum.  She also had hypotension that was slow to respond to IVF's. She was taken to OR by surgery where she had ex-lap which revealed perforated proximal descending colon.  She has an extended left hemicolectomy including mobilization of splenic flexure, creation of colostomy and mucous fistula.  Post OR, she returned to the ICU where she remained ventilated and on low dose pressors.  PCCM was asked to assist with vent management.  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  No Known Allergies  No current facility-administered  medications on file prior to encounter.    Current Outpatient Medications on File Prior to Encounter  Medication Sig  . acetaminophen (TYLENOL) 650 MG CR tablet Take 650 mg by mouth every 4 (four) hours as needed for pain.  . Amino Acids-Protein Hydrolys (FEEDING SUPPLEMENT, PRO-STAT SUGAR FREE 64,) LIQD Take 30 mLs by mouth 2 (two) times daily.  Marland Kitchen aspirin 325 MG tablet Take 325 mg by mouth daily with breakfast.   . atorvastatin (LIPITOR) 20 MG tablet Take 20 mg by mouth every evening.   . cloNIDine (CATAPRES) 0.1 MG tablet Take 0.1 mg by mouth 2 (two) times daily.  Marland Kitchen Dextrose, Diabetic Use, (INSTA-GLUCOSE) 77.4 % GEL Take 1 vial by mouth daily as needed (Diabetes, Give when CBG is <60).  . insulin aspart (NOVOLOG) 100 UNIT/ML injection Inject 0-15 Units into the skin 3 (three) times daily before meals. Per sliding scale   0-150 = 0 units 151-200 = 3 units 201-250 = 5 units 251-300 = 7 units 301-350 = 9 units 351-400 = 11 units 401-450 = 13 units 451-500 = 15 units and call provider  . insulin detemir (LEVEMIR) 100 UNIT/ML injection Inject 0.06 mLs (6 Units total) into the skin daily.  Marland Kitchen LORazepam (ATIVAN) 0.5 MG tablet Take 1 tablet (0.5 mg total) by mouth every 6 (six) hours as needed for anxiety.  . magnesium hydroxide (MILK OF MAGNESIA) 400 MG/5ML suspension Take 30 mLs by mouth daily as needed for mild constipation.  . megestrol (MEGACE) 400 MG/10ML suspension Take 800 mg by mouth daily.  . metoprolol succinate (TOPROL-XL) 50 MG 24 hr tablet Take 75 mg by mouth daily with breakfast. Take with or immediately following a meal.   .  mirtazapine (REMERON) 7.5 MG tablet Take 7.5 mg by mouth every other day.  . polyethylene glycol (MIRALAX / GLYCOLAX) packet Take 17 g by mouth daily. (Patient taking differently: Take 17 g by mouth daily with breakfast. )  . promethazine (PHENERGAN) 25 MG tablet Take 25 mg by mouth every 6 (six) hours as needed for nausea or vomiting.  . ranitidine (ZANTAC)  150 MG tablet Take 150 mg by mouth every evening.   . vitamin C (ASCORBIC ACID) 500 MG tablet Take 500 mg by mouth 2 (two) times daily.  Marland Kitchen zinc gluconate 50 MG tablet Take 50 mg by mouth daily with breakfast.    FAMILY HISTORY:  Her indicated that the status of her mother is unknown. She indicated that the status of her father is unknown. She indicated that the status of her sister is unknown. She indicated that the status of her brother is unknown. She indicated that the status of her neg hx is unknown.   SOCIAL HISTORY: She  reports that she quit smoking about 24 years ago. She has a 5.00 pack-year smoking history. She has quit using smokeless tobacco. She reports that she does not drink alcohol or use drugs.  REVIEW OF SYSTEMS:  Unable to obtain as pt is encephalopathic.  SUBJECTIVE:  On vent, sedated.  VITAL SIGNS: BP 102/60   Pulse (!) 118   Temp (!) 94.2 F (34.6 C) (Oral)   Resp 19   Ht 5' (1.524 m)   SpO2 94%   BMI 33.84 kg/m   HEMODYNAMICS: CVP:  [3 mmHg] 3 mmHg  VENTILATOR SETTINGS: Vent Mode: PRVC FiO2 (%):  [40 %-50 %] 50 % Set Rate:  [15 bmp] 15 bmp Vt Set:  [360 mL-450 mL] 450 mL PEEP:  [5 cmH20] 5 cmH20 Plateau Pressure:  [13 cmH20-19 cmH20] 19 cmH20  INTAKE / OUTPUT: I/O last 3 completed shifts: In: 5855.9 [I.V.:2774.4; Blood:630; IV Piggyback:2451.5] Out: 495 [Urine:375; Stool:20; Blood:100]   PHYSICAL EXAMINATION: General: Elderly appearing female, resting in bed, in NAD.  Bear hugger in place Neuro: Sedated, non-responsive. HEENT: [place]/AT. ETT in place. Cardiovascular: regular rhythm, tachycardic, no M/R/G.  Lungs: Respirations even and unlabored.  Tachypneic d/t ventilator settings.  CTAB Abdomen: Midline dressings C/D/I.  Colostomy bag in place with loose brown stool.  BS hypoactive. Abdomen soft.  Musculoskeletal: R BKA, no edema.  Skin: Intact, warm, no rashes.   LABS:  BMET Recent Labs  Lab 08/09/2017 1946  07/30/2017 2219 07/29/2017 2325  08/11/17 0500  NA 141   < > 151* 148* 147*  K 3.4*   < > 2.9* 3.5 3.8  CL 112*  --   --   --  119*  CO2 14*  --   --   --  20*  BUN 33*  --   --   --  28*  CREATININE 1.60*  --   --   --  1.19*  GLUCOSE 281*  --  119*  --  216*   < > = values in this interval not displayed.    Electrolytes Recent Labs  Lab 08/17/2017 1946 08/11/17 0500  CALCIUM 8.5* 6.9*  MG  --  1.5*    CBC Recent Labs  Lab 08/04/2017 1946  08/02/2017 2219 08/09/2017 2325 08/11/17 0500  WBC 12.8*  --   --   --  5.0  HGB 11.3*   < > 7.5* 10.5* 13.6  HCT 38.3   < > 22.0* 31.0* 43.5  PLT 290  --   --   --  243   < > = values in this interval not displayed.    Coag's Recent Labs  Lab 08/18/2017 1946  APTT 22*  INR 1.33    Sepsis Markers Recent Labs  Lab 08/06/2017 2011 08/11/17 0055 08/11/17 0100 08/11/17 0359  LATICACIDVEN 6.41*  --  4.4* 3.5*  PROCALCITON  --  45.55  --   --     ABG Recent Labs  Lab 08/14/2017 2215 07/27/2017 2325 08/11/17 0324  PHART 7.285* 7.258* 7.264*  PCO2ART 47.1 31.3* 38.7  PO2ART 285.0* 74.0* 69.0*    Liver Enzymes Recent Labs  Lab 08/20/2017 1946  AST 18  ALT 12  ALKPHOS 74  BILITOT 0.5  ALBUMIN 2.5*    Cardiac Enzymes No results for input(s): TROPONINI, PROBNP in the last 168 hours.  Glucose Recent Labs  Lab 08/24/2017 1957 08/11/17 0030 08/11/17 0405 08/11/17 0741  GLUCAP 226* 130* 182* 186*    Imaging Ct Abdomen Pelvis Wo Contrast  Result Date: 08/18/2017 CLINICAL DATA:  Abdominal distension.  Follow-up pneumoperitoneum. EXAM: CT ABDOMEN AND PELVIS WITHOUT CONTRAST TECHNIQUE: Multidetector CT imaging of the abdomen and pelvis was performed following the standard protocol without IV contrast. COMPARISON:  Abdominal radiograph August 10, 2016 and CT abdomen and pelvis August 01, 2015. FINDINGS: LOWER CHEST: Bilateral lower lobe consolidation. Heart size is normal. Moderate coronary artery calcifications. No pericardial effusion. HEPATOBILIARY: Normal.  PANCREAS: Normal. SPLEEN: Normal. ADRENALS/URINARY TRACT: Kidneys are orthotopic, demonstrating normal size and morphology. No nephrolithiasis, hydronephrosis; limited assessment for renal masses by nonenhanced CT. 2.5 cm dense (70 Hounsfield units) RIGHT lower pole proteinaceous/hemorrhagic cyst, no routine indicated follow-up by consensus. 9 mm dense RIGHT interpolar cyst. Isodense probable mass lower pole LEFT kidney. All renal masses appear relatively stable from prior contrast enhanced CT. The unopacified ureters are normal in course and caliber. Urinary bladder is partially distended and unremarkable. Normal adrenal glands. STOMACH/BOWEL: Moderate hiatal hernia. Large amount of stool in sigmoid colon through the level of the anus, rectum distended to 9.1 cm. At proximal sigmoid colon is a linear intramural lucency concerning for perforation (coronal image 32), associated circumferential focal mural thickening. Adjacent feculent appearing material in adjacent peritoneum. Moderate colonic diverticulosis. Debris distended stomach. Normal appendix. VASCULAR/LYMPHATIC: Aortoiliac vessels are normal in course and caliber. Moderate to severe calcific atherosclerosis. No lymphadenopathy by CT size criteria. REPRODUCTIVE: Normal. OTHER: Small volume ascites LEFT upper quadrant. Large volume pneumoperitoneum. MUSCULOSKELETAL: Non-acute. IMPRESSION: 1. Large volume pneumoperitoneum. Probable sigmoid bowel perforation with adjacent probable stool in peritoneal cavity. Ulcerated tumor not excluded. 2. Large volume rectosigmoid stool, rectum distended to 9.1 cm. 3. Bilateral lower lobe consolidation concerning for pneumonia. 4. Critical Value/emergent results were called by telephone at the time of interpretation on 08/13/2017 at 8:50 pm to Dr. Dorie Rank , who verbally acknowledged these results. Electronically Signed   By: Elon Alas M.D.   On: 08/09/2017 20:54   Ct Head Wo Contrast  Result Date:  08/09/2017 CLINICAL DATA:  Altered mental status from baseline. EXAM: CT HEAD WITHOUT CONTRAST TECHNIQUE: Contiguous axial images were obtained from the base of the skull through the vertex without intravenous contrast. COMPARISON:  04/23/2017 FINDINGS: Brain: Subtle hypodensity involving the left pons and midbrain may represent nonhemorrhagic infarcts, apparently new since prior comparison on 04/23/2017. Atrophy with chronic stable small vessel ischemic disease of periventricular white matter. No acute intracranial hemorrhage, midline shift or edema. No large vascular territory infarct. No intra-axial mass nor extra-axial fluid collections. Midline fourth ventricle and basal cisterns without effacement.  Vascular: Hyperdense vessel sign. Skull: No acute skull fracture Sinuses/Orbits: Bilateral lens replacements. Clear paranasal sinuses. Other: Clear mastoids IMPRESSION: 1. Chronic appearing small vessel ischemic disease of periventricular white matter with atrophy. 2. Age indeterminate but possibly new since prior exam are subtle hypodensities in the left pons and midbrain suspicious for small nonhemorrhagic infarcts. Electronically Signed   By: Ashley Royalty M.D.   On: 08/24/2017 20:45   Dg Chest Port 1 View  Result Date: 08/11/2017 CLINICAL DATA:  77 y/o F; post intubation and central line placement. EXAM: PORTABLE CHEST 1 VIEW COMPARISON:  08/16/2017 chest radiograph FINDINGS: Stable cardiomegaly given projection and technique. No appreciable pneumoperitoneum. Stable bibasilar opacities and small effusions. Endotracheal tube tip projects 3.7 cm above the carina. Right central venous catheter tip projects over right cavoatrial junction. Enteric tube tip extends below field of view into the abdomen. Bones are unremarkable. IMPRESSION: 1. Stable bibasilar atelectasis and small effusions. 2. Endotracheal tube tip 3.7 cm above the carina. 3. Enteric tube tip extends below field of view into abdomen. 4. Right central  venous catheter tip projects over cavoatrial junction. Electronically Signed   By: Kristine Garbe M.D.   On: 08/11/2017 02:05   Dg Chest Port 1 View  Result Date: 08/23/2017 CLINICAL DATA:  Worsening altered mental status. History of dementia. EXAM: PORTABLE CHEST 1 VIEW COMPARISON:  Chest radiograph April 23, 2017 FINDINGS: Air underneath bilateral hemidiaphragms. Bibasilar atelectasis. Stable cardiomegaly. Calcified aortic arch. No pneumothorax. Osteopenia. IMPRESSION: 1. Pneumoperitoneum. 2. Stable cardiomegaly.  Bibasilar atelectasis/scarring. 3.  Aortic Atherosclerosis (ICD10-I70.0). 4. Critical Value/emergent results were called by telephone at the time of interpretation on 08/04/2017 at 7:23 pm to Dr. Dorie Rank , who verbally acknowledged these results. Electronically Signed   By: Elon Alas M.D.   On: 07/29/2017 19:24     STUDIES:  CT head 7/16 > chronic small vessel ischemic disease. CT A / P 7/16 > large volume pneumoperitoneum, probable sigmoid perf, b/l lower lobe consolidation.  CULTURES: Blood 7/16 > in process  ANTIBIOTICS: Vanc 7/17 >  Zosyn 7/17 >   SIGNIFICANT EVENTS: 7/16 > admit, to OR for ex-lap. 7/17 > remains intubated after hemicolectomy and colostomy placement  LINES/TUBES: ETT 7/16 >  R IJ CVL 7/16 >  L radial art line 7/16 >   DISCUSSION: 77 y.o. female from nursing home, admitted 7/16 with pneumoperitoneum.  Taken for ex-lap where she was found to have perforated proximal descending colon which was repaired and colostomy performed.  ASSESSMENT / PLAN:  PULMONARY A: Respiratory insufficiency - remains intubated post op. Possible HCAP. Hx asthma. CXR on 7/17 with stable bibasilar atelectasis and small effusions P:   Full vent support. Wean as able. VAP prevention measures. SBT in AM if able. See ID section. Albuterol PRN.  CARDIOVASCULAR A:  Septic shock - due to peritonitis +/- HCAP.  A repeat sepsis assessment has been  performed. Hx HTN, HLD.  Cortisol level 14.2 (below threshold for stopping stress dose solucortef). P:  Continue levophed and vasopressin as needed, goal MAP > 65. Assess cortisol and continue empiric stress steroids. Will add phenylephrine if further support needed. Trend lactate - 6.41>4.4>3.5 since admission Hold preadmission ASA, atorvastatin, clonidine, toprol-xl. CVP is 3-4, so will give one liter bolus LR  RENAL A:   Hypokalemia on admission, now normalized to 3.8 AKI, creatinine improved from 1.60 to 1.19 overnight. AGMA due to lactic acidosis on admission, now resolved (AG=8) Hypocalcemia, worsening from 8.5 >6.9 after Ca administration overnight P:  NS @ 125. BMP daily. Ca 1 g. Consider IV Mg 2 g.  GASTROINTESTINAL A:   Pneumoperitoneum - due to perforated descending colon.  S/p ex-lap with repair and creation of colostomy. GI prophylaxis. Nutrition. Hx peptic ulcers, GERD, colitis. P:   Post op care per CCS. SUP: Famotidine. NPO.  HEMATOLOGIC A:   VTE Prophylaxis. P:  SCD's / heparin. CBC in AM.  INFECTIOUS A:   Septic shock - due to peritonitis.  Hypothermic this morning with temp of 94.2.  A repeat sepsis assessment has been performed. P:   Abx as above (vanc / zosyn). Can consider addition of eraxis.  Follow cultures as above. Follow PCT.  ENDOCRINE A:   Hx DM.  Sugars well controlled in mid to upper 100s overnight. P:   SSI. Hold preadmission novolog, levemir.  NEUROLOGIC A:   Sedation needs due to mechanical ventilation. Hx anxiety. P:   Sedation:  Fentanyl gtt / Midazolam PRN. RASS goal: 0 to -1. Daily WUA. Hold preadmission lorazepam, mirtazapine.  Family updated: None available.  Interdisciplinary Family Meeting v Palliative Care Meeting:  Due by: 08/18/17.  CC time: 35 min.  Lakeith Careaga C. Shan Levans, MD PGY-2, [facility] 08/11/2017 9:30 AM  Pager: [phone]  or [phone] 08/11/2017, 9:12 AM

## 2017-08-11 NOTE — Consult Note (Addendum)
[place] Nurse ostomy consult note Ostomy and mucus fistula created emergently  08/11/17. Dr. Kae Heller. No family in room. Stoma type/location: Transverse LUQ colostomy, LLQ MF Stomal assessment/size:  Transverse colostomy measures approximately 1 and 3/8 inches (viewed through pouch). Budded, red, moist, edematous. MF measures approximately 7/8 inch x 1-inch oval (viewed through pouch). Retracted, in depression, red. Peristomal assessment: Not seen today Treatment options for stomal/peristomal skin: N/A Output: None from Colostomy, light tan mucus from MF in scant amount.  Ostomy pouching: Supplies provided to room.  Plan to change both tomorrow. Transverse Colostomy: 2pc. 2 and 1/4 fecal pouching system with skin barrier ring ordered. Mucus fistula: 1 piece convex pouching system with skin barrier ring ordered. Education provided: None today. Enrolled patient in [place] program: No   WOC Nurse wound consult note Reason for Consult: Chronic, nonhealing ulcer on the left lateral foot at 5th digit. Seen by this writer on 04/23/17.  Also seen by Dr. Eather Colas on that date for POC for chronic osteomyelitis. Conservative management and observation was ordered at that time over AKA amputation.. Wound type: Neuropathic Pressure Injury POA: N/A Measurement:2cm x 2.5cm x o.2cm Wound bed: dry, stained with betadine Drainage (amount, consistency, odor) Small amount light yellow exudate Periwound:intact, dry Dressing procedure/placement/frequency: I will implement a conservative POC using betadine swabstick application twice daily and a dry dressing. If further orders are requested on this wound, please contact Dr. Sharol Given.   [facility] nursing team will follow for ostomy and MF, and will remain available to this patient, the nursing and medical teams.   Thanks, Maudie Flakes, MSN, RN, [place], Arther Abbott  Pager# [phone]

## 2017-08-12 ENCOUNTER — Inpatient Hospital Stay (HOSPITAL_COMMUNITY): Payer: Medicare Other

## 2017-08-12 LAB — BASIC METABOLIC PANEL
ANION GAP: 14 (ref 5–15)
Anion gap: 14 (ref 5–15)
BUN: 34 mg/dL — AB (ref 8–23)
BUN: 34 mg/dL — AB (ref 8–23)
CHLORIDE: 115 mmol/L — AB (ref 98–111)
CO2: 14 mmol/L — AB (ref 22–32)
CO2: 14 mmol/L — AB (ref 22–32)
Calcium: 6.6 mg/dL — ABNORMAL LOW (ref 8.9–10.3)
Calcium: 6.3 mg/dL — CL (ref 8.9–10.3)
Chloride: 114 mmol/L — ABNORMAL HIGH (ref 98–111)
Creatinine, Ser: 1.7 mg/dL — ABNORMAL HIGH (ref 0.44–1.00)
Creatinine, Ser: 1.71 mg/dL — ABNORMAL HIGH (ref 0.44–1.00)
GFR calc Af Amer: 32 mL/min — ABNORMAL LOW (ref >60)
GFR calc non Af Amer: 28 mL/min — ABNORMAL LOW (ref >60)
GFR, EST AFRICAN AMERICAN: 32 mL/min — AB (ref >60)
GFR, EST NON AFRICAN AMERICAN: 27 mL/min — AB (ref >60)
Glucose, Bld: 185 mg/dL — ABNORMAL HIGH (ref 70–99)
Glucose, Bld: 159 mg/dL — ABNORMAL HIGH (ref 70–99)
POTASSIUM: 4.4 mmol/L (ref 3.5–5.1)
POTASSIUM: 4.5 mmol/L (ref 3.5–5.1)
Sodium: 143 mmol/L (ref 135–145)
Sodium: 142 mmol/L (ref 135–145)

## 2017-08-12 LAB — GLUCOSE, CAPILLARY
GLUCOSE-CAPILLARY: 124 mg/dL — AB (ref 70–99)
GLUCOSE-CAPILLARY: 140 mg/dL — AB (ref 70–99)
GLUCOSE-CAPILLARY: 78 mg/dL (ref 70–99)
GLUCOSE-CAPILLARY: 92 mg/dL (ref 70–99)
Glucose-Capillary: 140 mg/dL — ABNORMAL HIGH (ref 70–99)
Glucose-Capillary: 103 mg/dL — ABNORMAL HIGH (ref 70–99)

## 2017-08-12 LAB — POCT I-STAT 3, ART BLOOD GAS (G3+)
ACID-BASE DEFICIT: 14 mmol/L — AB (ref 0.0–2.0)
ACID-BASE DEFICIT: 14 mmol/L — AB (ref 0.0–2.0)
BICARBONATE: 12.4 mmol/L — AB (ref 20.0–28.0)
Bicarbonate: 11.7 mmol/L — ABNORMAL LOW (ref 20.0–28.0)
O2 SAT: 94 %
O2 Saturation: 97 %
PCO2 ART: 26.4 mmHg — AB (ref 32.0–48.0)
PO2 ART: 76 mmHg — AB (ref 83.0–108.0)
Patient temperature: 97.7
TCO2: 13 mmol/L — AB (ref 22–32)
TCO2: 13 mmol/L — AB (ref 22–32)
pCO2 arterial: 30.2 mmHg — ABNORMAL LOW (ref 32.0–48.0)
pH, Arterial: 7.219 — ABNORMAL LOW (ref 7.350–7.450)
pH, Arterial: 7.251 — ABNORMAL LOW (ref 7.350–7.450)
pO2, Arterial: 101 mmHg (ref 83.0–108.0)

## 2017-08-12 LAB — LACTIC ACID, PLASMA
LACTIC ACID, VENOUS: 6.3 mmol/L — AB (ref 0.5–1.9)
Lactic Acid, Venous: 6 mmol/L (ref 0.5–1.9)

## 2017-08-12 LAB — CBC
HEMATOCRIT: 38 % (ref 36.0–46.0)
HEMOGLOBIN: 11.9 g/dL — AB (ref 12.0–15.0)
MCH: 27 pg (ref 26.0–34.0)
MCHC: 31.3 g/dL (ref 30.0–36.0)
MCV: 86.4 fL (ref 78.0–100.0)
Platelets: 169 10*3/uL (ref 150–400)
RBC: 4.4 MIL/uL (ref 3.87–5.11)
RDW: 19.7 % — ABNORMAL HIGH (ref 11.5–15.5)
WBC: 14.7 10*3/uL — ABNORMAL HIGH (ref 4.0–10.5)

## 2017-08-12 LAB — PROCALCITONIN: Procalcitonin: >150 ng/mL

## 2017-08-12 LAB — PHOSPHORUS: PHOSPHORUS: 5 mg/dL — AB (ref 2.5–4.6)

## 2017-08-12 MED ORDER — SODIUM CHLORIDE 0.9 % IV SOLN
1.0000 g | Freq: Once | INTRAVENOUS | Status: AC
  Administered 2017-08-12: 1 g via INTRAVENOUS
  Filled 2017-08-12: qty 10

## 2017-08-12 MED ORDER — SODIUM BICARBONATE 8.4 % IV SOLN
INTRAVENOUS | Status: DC
  Administered 2017-08-12: 07:00:00 via INTRAVENOUS
  Filled 2017-08-12: qty 150

## 2017-08-12 MED ORDER — SODIUM CHLORIDE 0.9 % IV SOLN
200.0000 mg | Freq: Once | INTRAVENOUS | Status: AC
  Administered 2017-08-12: 200 mg via INTRAVENOUS
  Filled 2017-08-12: qty 200

## 2017-08-12 MED ORDER — SODIUM CHLORIDE 0.9 % IV BOLUS
1000.0000 mL | Freq: Once | INTRAVENOUS | Status: AC
  Administered 2017-08-12: 1000 mL via INTRAVENOUS

## 2017-08-12 MED ORDER — SODIUM CHLORIDE 0.9 % IV SOLN
INTRAVENOUS | Status: DC | PRN
  Administered 2017-08-12 – 2017-08-14 (×2): 250 mL via INTRAVENOUS
  Administered 2017-08-15: 1000 mL via INTRAVENOUS
  Administered 2017-08-18: 1 mL via INTRAVENOUS
  Administered 2017-08-23: 500 mL via INTRAVENOUS

## 2017-08-12 MED ORDER — SODIUM CHLORIDE 0.9 % IV SOLN
100.0000 mg | INTRAVENOUS | Status: DC
  Administered 2017-08-13 – 2017-08-17 (×5): 100 mg via INTRAVENOUS
  Filled 2017-08-12 (×6): qty 100

## 2017-08-12 NOTE — Progress Notes (Signed)
Initial Nutrition Assessment  DOCUMENTATION CODES:   Obesity unspecified  INTERVENTION:    When able to begin enteral nutrition, recommend Vital High Protein with goal rate = 50 ml/h (1200 ml per day) to provide 1200 kcal, 105 gm protein, 1003 ml free water daily.   If unable to begin enteral nutrition within the next 6-7 days, recommend TPN.    Recommend obtaining current weight.  NUTRITION DIAGNOSIS:   Inadequate oral intake related to inability to eat, altered GI function as evidenced by NPO status.  GOAL:   Provide needs based on ASPEN/SCCM guidelines  MONITOR:   Vent status, Labs, I & O's, Skin  REASON FOR ASSESSMENT:   Ventilator    ASSESSMENT:   77 yo female with PMH of DM, HLD, asthma, GERD, Charcot's joint, R BKA, HLD, and HTN who was admitted on 7/16 with septic shock and pneumoperitoneum; found to have perforated proximal descending colon. S/P extended L hemicolectomy with distal transverse colostomy and mucous fistula creation on 7/17.   Discussed patient in ICU rounds and with RN today. Patient is now requiring 2 pressors; Surgery and CCM teams are not ready to begin enteral nutrition at this time.  Patient is currently intubated on ventilator support MV: 8.2 L/min Temp (24hrs), Avg:98 F (36.7 C), Min:97.6 F (36.4 C), Max:99 F (37.2 C)   Labs reviewed. BUN 34 (H), creatitine 1.7 (H), phosphorus 5 (H) CBG's: 140-103-140 Medications reviewed and include Levophed, Vasopressin, Solu-cortef, and Novolog SSI.    NUTRITION - FOCUSED PHYSICAL EXAM:    Most Recent Value  Orbital Region  No depletion  Upper Arm Region  No depletion  Thoracic and Lumbar Region  Unable to assess  Buccal Region  Unable to assess  Temple Region  No depletion  Clavicle Bone Region  No depletion  Clavicle and Acromion Bone Region  No depletion  Scapular Bone Region  Unable to assess  Dorsal Hand  Unable to assess  Patellar Region  No depletion  Anterior Thigh Region   No depletion  Posterior Calf Region  No depletion  Edema (RD Assessment)  Mild  Hair  Reviewed  Eyes  Unable to assess  Mouth  Unable to assess  Skin  Reviewed  Nails  Unable to assess       Diet Order:   Diet Order           Diet NPO time specified  Diet effective now          EDUCATION NEEDS:   No education needs have been identified at this time  Skin:  Skin Assessment: Skin Integrity Issues: Skin Integrity Issues:: Diabetic Ulcer, Incisions Diabetic Ulcer: L foot Incisions: abdomen  Last BM:  7/18 (colostomy)  Height:   Ht Readings from Last 1 Encounters:  08/11/17 5' (1.524 m)    Weight:   Wt Readings from Last 1 Encounters:  04/26/17 173 lb 4.5 oz (78.6 kg)    Ideal Body Weight:  42.5 kg  BMI:  36.2 (adjusted for BKA)  Estimated Nutritional Needs:   Kcal:  1000-1200  Protein:  >/= 85 gm  Fluid:  1.5 L    Molli Barrows, RD, LDN, [place] Pager [phone] After Hours Pager [phone]

## 2017-08-12 NOTE — Progress Notes (Signed)
CRITICAL VALUE ALERT  Critical Value:  Calcium 6.3, Lactic Acid 6  Date & Time Notied:  08/12/17 1840  Provider Notified: Dr. Pearline Cables  Orders Received/Actions taken: 1g of Calcium Chloride

## 2017-08-12 NOTE — Progress Notes (Signed)
eLink Physician-Brief Progress Note Patient Name: Judith [name] DOB: 1940/05/05 MRN: [id number]   Date of Service  08/12/2017  HPI/Events of Note  Ole guerria   eICU Interventions  Giving the fluids        Presly Steinruck 08/12/2017, 2:58 AM

## 2017-08-12 NOTE — Progress Notes (Signed)
Patient's ETT was repositioned per order. The ETT is currently at 24cm. Will continue to monitor the patient.

## 2017-08-12 NOTE — Progress Notes (Signed)
MD notified of increase heart rate and low urine output. Orders placed for 1,050ml bolus. Will continue to monitor.

## 2017-08-12 NOTE — Consult Note (Signed)
[place] Nurse ostomy follow up: POD 1 Stoma type/location: LUQ transverse colostomy and LLQ MF Stomal assessment/size:  Transverse Colostomy measures 1 and 3/8 inches, round, red, budded, edematous, moist, os at center. Peristomal skin clear. 2-piece pouching system placed today (2 and 3/4 inch, but will decrease to 2 and 1/4 inch supplies with next pouch change). Small amount of serosanguinous effluent in pouch, no stool or flatus. Mucus Fistula measures 1 and 1/8 inches when gentle traction is applied in the 12 o'clock position. Oval at rest.  Deep crease along distal border of stoma. Peristomal skin clear. Skin barrier ring placed around stoma and it fills the aforementioned creases. 2-piece pouching system (2 and 1/4) applied on top of ring. Small amount of light brown to tan stool in pouch. I will leave a convex pouch for my partners to consider if the MF continues to leak due to creases.  Peristomal assessment: As noted above Treatment options for stomal/peristomal skin: As noted above Output: As Noted above Ostomy pouching: Both stomas are pouched using 2-piece pouching systems and skin barrier rings. Education provided: None today.  Patient remains on vent, on pressors and is alert, but non communicative.  No family in room.  No education materials provided at this time.  Supplies at bedside include 4 (four) 2 and 1/4 inch skin barriers, 4 corresponding pouches, 8 skin barrier rings and 2, 1-piece convex pouches.  A measuring guide and a pattern are also at bedside. Enrolled patient in [place] Start Discharge program: No  WOC nursing team will follow along with you, and will remain available to this patient, the nursing and medical teams.   Thanks, Maudie Flakes, MSN, RN, [place], Arther Abbott  Pager# [phone]

## 2017-08-12 NOTE — Progress Notes (Signed)
CRITICAL VALUE ALERT  Critical Value:  Lactic acid 6.3  Date & Time Notied: 08/12/2017 05:34  Provider Notified: Dr. Halford Chessman  Orders Received/Actions taken: none at this time

## 2017-08-12 NOTE — Consult Note (Addendum)
PULMONARY / CRITICAL CARE MEDICINE   Name: Judith [name] MRN: [id number] DOB: 1940-02-20    ADMISSION DATE:  08/01/2017 CONSULTATION DATE:  08/11/17  REFERRING MD:  Kae Heller  CHIEF COMPLAINT:  Vent Management  HISTORY OF PRESENT ILLNESS:  Pt is encephelopathic; therefore, this HPI is obtained from chart review. Judith [name] is a 77 y.o. female with PMH as outlined below and who resides at nursing home.  She was brought to [facility] ED 7/16 after staff at nursing home found her with AMS.  She had been asymptomatic prior to this. In ED, she had CXR and CT A / P which showed large amount of pneumoperitoneum.  She also had hypotension that was slow to respond to IVF's. She was taken to OR by surgery where she had ex-lap which revealed perforated proximal descending colon.  She has an extended left hemicolectomy including mobilization of splenic flexure, creation of colostomy and mucous fistula.  Post OR, she returned to the ICU where she remained ventilated and on low dose pressors.  PCCM was asked to assist with vent management.  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  No Known Allergies  No  current facility-administered medications on file prior to encounter.    Current Outpatient Medications on File Prior to Encounter  Medication Sig  . acetaminophen (TYLENOL) 325 MG tablet Take 650 mg by mouth every 6 (six) hours as needed for mild pain.  . Amino Acids-Protein Hydrolys (FEEDING SUPPLEMENT, PRO-STAT SUGAR FREE 64,) LIQD Take 30 mLs by mouth 2 (two) times daily.  . Ascorbic Acid (VITAMIN C PO) Take 1 tablet by mouth 2 (two) times daily.   Marland Kitchen aspirin 325 MG tablet Take 325 mg by mouth daily with breakfast.   . atorvastatin (LIPITOR) 20 MG tablet Take 20 mg by mouth every evening.   Marland Kitchen Dextrose, Diabetic Use, (INSTA-GLUCOSE) 77.4 % GEL Take 1 vial by mouth daily as needed (Diabetes, Give when CBG is <60).  . insulin aspart (NOVOLOG) 100 UNIT/ML injection Inject 0-15 Units into the skin See admin instructions. Three times daily Per sliding scale   0-150 = 0 units 151-200 = 3 units 201-250 = 5 units 251-300 = 7 units 301-350 = 9 units 351-400 = 11 units 401-450 = 13 units 451-500 = 15 units and call provider  . insulin detemir (LEVEMIR) 100 UNIT/ML injection Inject 0.06 mLs (6 Units total) into the skin daily. (Patient taking differently: Inject 15 Units into the skin every 12 (twelve) hours. )  . loperamide (IMODIUM) 2 MG capsule Take 2 mg by mouth every 8 (eight) hours as needed for diarrhea or loose stools.  . magnesium hydroxide (MILK OF MAGNESIA) 400 MG/5ML suspension Take 10 mLs by mouth daily as needed for mild constipation.   . megestrol (MEGACE) 400 MG/10ML suspension Take 800 mg by mouth daily.  . metoprolol succinate (TOPROL-XL) 25 MG 24 hr  tablet Take 75 mg by mouth daily with breakfast. Take with or immediately following a meal.   . mirtazapine (REMERON) 7.5 MG tablet Take 7.5 mg by mouth every other day.  . polyethylene glycol (MIRALAX / GLYCOLAX) packet Take 17 g by mouth daily. (Patient taking differently: Take 17 g by mouth daily as needed for mild constipation. )   . Probiotic Product (PROBIOTIC PO) Take 1 capsule by mouth daily.  . promethazine (PHENERGAN) 25 MG tablet Take 25 mg by mouth every 6 (six) hours as needed for nausea or vomiting.  . ranitidine (ZANTAC) 150 MG tablet Take 150 mg by mouth daily with supper.   . zinc gluconate 50 MG tablet Take 50 mg by mouth daily with breakfast.    FAMILY HISTORY:  Her indicated that the status of her mother is unknown. She indicated that the status of her father is unknown. She indicated that the status of her sister is unknown. She indicated that the status of her brother is unknown. She indicated that the status of her neg hx is unknown.   SOCIAL HISTORY: She  reports that she quit smoking about 24 years ago. She has a 5.00 pack-year smoking history. She has quit using smokeless tobacco. She reports that she does not drink alcohol or use drugs.  REVIEW OF SYSTEMS:  Unable to obtain as pt is encephalopathic.  SUBJECTIVE:  On vent, sedated.  VITAL SIGNS: BP (!) 115/49   Pulse (!) 116   Temp 97.8 F (36.6 C) (Oral)   Resp 20   Ht 5' (1.524 m)   SpO2 98%   BMI 33.84 kg/m   HEMODYNAMICS: CVP:  [3 mmHg-10 mmHg] 10 mmHg  VENTILATOR SETTINGS: Vent Mode: PRVC FiO2 (%):  [60 %-80 %] 60 % Set Rate:  [15 bmp] 15 bmp Vt Set:  [360 mL] 360 mL PEEP:  [5 cmH20] 5 cmH20 Plateau Pressure:  [9 cmH20-15 cmH20] 12 cmH20  INTAKE / OUTPUT: I/O last 3 completed shifts: In: 10869.9 [I.V.:5412.5; Blood:630; IV Piggyback:4827.4] Out: 980 [Urine:660; Emesis/NG output:200; Stool:20; Blood:100]   PHYSICAL EXAMINATION: General: Elderly appearing female, resting in bed, in NAD.  Opened eyes spontaneously and moved extremities. Neuro: Sedated, minimally responsive HEENT: [place]/AT. ETT in place. Cardiovascular: regular rhythm, tachycardic, no M/R/G.  Lungs: Respirations even and unlabored.  CTAB Abdomen: Midline dressings C/D/I.  Colostomy bag in place with loose brown stool.  BS hypoactive. Abdomen soft.   Musculoskeletal: R BKA, no edema.  Some bruising on L foot. Skin: Intact, warm, no rashes.   LABS:  BMET Recent Labs  Lab 08/07/2017 1946  08/11/2017 2219 08/23/2017 2325 08/11/17 0500 08/12/17 0426  NA 141   < > 151* 148* 147* 143  K 3.4*   < > 2.9* 3.5 3.8 4.4  CL 112*  --   --   --  119* 115*  CO2 14*  --   --   --  20* 14*  BUN 33*  --   --   --  28* 34*  CREATININE 1.60*  --   --   --  1.19* 1.70*  GLUCOSE 281*  --  119*  --  216* 185*   < > = values in this interval not displayed.    Electrolytes Recent Labs  Lab 08/11/2017 1946 08/11/17 0500 08/12/17 0426  CALCIUM 8.5* 6.9* 6.6*  MG  --  1.5*  --   PHOS  --   --  5.0*    CBC Recent Labs  Lab 08/14/2017 1946  08/02/2017 2325 08/11/17 0500 08/12/17 0426  WBC 12.8*  --   --  5.0 14.7*  HGB 11.3*   < > 10.5* 13.6 11.9*  HCT 38.3   < > 31.0* 43.5 38.0  PLT 290  --   --  243 169   < > = values in this interval not displayed.    Coag's Recent Labs  Lab 08/01/2017 1946  APTT 22*  INR 1.33    Sepsis Markers Recent Labs  Lab 08/11/17 0055 08/11/17 0100 08/11/17 0359 08/12/17 0426  LATICACIDVEN  --  4.4* 3.5* 6.3*  PROCALCITON 45.55  --   --  >150.00    ABG Recent Labs  Lab 08/11/17 0324 08/11/17 1003 08/12/17 0600  PHART 7.264* 7.277* 7.219*  PCO2ART 38.7 32.4 30.2*  PO2ART 69.0* 53.0* 101.0    Liver Enzymes Recent Labs  Lab 08/22/2017 1946  AST 18  ALT 12  ALKPHOS 74  BILITOT 0.5  ALBUMIN 2.5*    Cardiac Enzymes No results for input(s): TROPONINI, PROBNP in the last 168 hours.  Glucose Recent Labs  Lab 08/11/17 1114 08/11/17 1513 08/11/17 2002 08/11/17 2351 08/12/17 0420 08/12/17 0745  GLUCAP 188* 135* 75 72 78 140*    Imaging No results found.   STUDIES:  CT head 7/16 > chronic small vessel ischemic disease. CT A / P 7/16 > large volume pneumoperitoneum, probable sigmoid perf, b/l lower lobe consolidation.  CULTURES: Blood 7/16 > in process Tracheal aspirate 7/17 >    ANTIBIOTICS: Vanc 7/17 >  Zosyn 7/17 >   SIGNIFICANT EVENTS: 7/16 > admit, to OR for ex-lap. 7/17 > remains intubated after hemicolectomy and colostomy placement 7/18 > worsening labs overnight, poor UOP, HCO3 infusion started  LINES/TUBES: ETT 7/16 >  R IJ CVL 7/16 >  L radial art line 7/16 >   DISCUSSION: 77 y.o. female from nursing home, admitted 7/16 with pneumoperitoneum.  Taken for ex-lap where she was found to have perforated proximal descending colon which was repaired and colostomy performed.  ASSESSMENT / PLAN:  PULMONARY A: Respiratory insufficiency - remains intubated post op. Possible HCAP. Hx asthma. CXR on 7/17 with stable bibasilar atelectasis and small effusions P:   Full vent support. Wean as able. VAP prevention measures. See ID section. Albuterol PRN. Obtain repeat CXR  CARDIOVASCULAR A:  Septic shock - due to peritonitis +/- HCAP.  A repeat sepsis assessment has been performed. Hx HTN, HLD.  Cortisol level 14.2 (below threshold for stopping stress dose solucortef). P:  Continue levophed and vasopressin as needed, goal MAP > 65. Hesitant to stopping solucortef today in setting of worsening pressor needs; will stop as soon as patient will tolerate to facilitate wound healing Trend lactate - 6.41>4.4>3.5>6.3  since admission Hold preadmission ASA, atorvastatin, clonidine, toprol-xl. Wean levophed d/t current MAP of 79  RENAL A:   Hypokalemia on admission, now normalized to 4.4 AKI, creatinine worsened from 1.19 to 1.70 overnight with small amount of UOP. AGMA due to lactic acidosis on admission, worsened since 7/17 with gap of 8>14 Hypocalcemia, continued P:   Continue mIVF @ 100 ml/hr BMP daily. 1L NS bolus Stop HCO3  GASTROINTESTINAL A:   Pneumoperitoneum - due to perforated descending colon.  S/p ex-lap with repair and creation of colostomy. GI prophylaxis. Nutrition. Hx peptic ulcers, GERD, colitis. Consider stopping solucortef  to aid wound healing. P:   Post op care per CCS. SUP: Famotidine. NPO.  HEMATOLOGIC A:   VTE Prophylaxis. P:  SCD's / heparin. CBC  in AM.  INFECTIOUS A:   Septic shock - due to peritonitis.  Hypothermic this morning with temp of 94.2.  A repeat sepsis assessment has been performed. P:   Abx as above (vanc / zosyn). Add eraxis today given worsening clinical picture. Follow cultures as above. Follow PCT.  ENDOCRINE A:   Hx DM.  Sugars well controlled in mid to upper 100s overnight. P:   SSI. Hold preadmission novolog, levemir.  NEUROLOGIC A:   Sedation needs due to mechanical ventilation. Hx anxiety. P:   Sedation:  Fentanyl gtt / Midazolam PRN. RASS goal: 0 to -1. Daily WUA. Hold preadmission lorazepam, mirtazapine.  Family updated: None available.  Interdisciplinary Family Meeting v Palliative Care Meeting:  Due by: 08/18/17.  CC time: 35 min.  Jaselyn Nahm C. Shan Levans, MD PGY-2, [facility] 08/12/2017 8:19 AM  Pager: [phone]  or [phone] 08/12/2017, 8:19 AM

## 2017-08-12 NOTE — Progress Notes (Addendum)
Patient ID: Judith [name], female   DOB: 1940/07/27, 77 y.o.   MRN: [id number]    2 Days Post-Op  Subjective: Pt on vent, sedated with some fentanyl.  On 2 pressors still.  UOP has decreased.  Only about 210cc yesterday.  Only abut 20 cc in foley bag currently.  Objective: Vital signs in last 24 hours: Temp:  [95.5 F (35.3 C)-99 F (37.2 C)] 97.8 F (36.6 C) (07/18 0747) Pulse Rate:  [70-129] 116 (07/18 0700) Resp:  [17-35] 20 (07/18 0700) BP: (76-131)/(43-94) 115/49 (07/18 0700) SpO2:  [85 %-100 %] 98 % (07/18 0729) Arterial Line BP: (73-125)/(38-75) 114/46 (07/18 0700) FiO2 (%):  [60 %-80 %] 60 % (07/18 0729) Last BM Date: 08/11/17  Intake/Output from previous day: 07/17 0701 - 07/18 0700 In: 4947.9 [I.V.:2584.4; IV Piggyback:2363.5] Out: 485 [Urine:285; Emesis/NG output:200] Intake/Output this shift: No intake/output data recorded.  PE: Gen: critically ill Heart: regular, but tachy Lungs: CTAB, on vent Abd: soft, few BS, no NGT output, colostomy with a pink and viable stoma, no air or stool in bag.  Mucous fistula with a small amount of stool present.  Lab Results:  Recent Labs    08/11/17 0500 08/12/17 0426  WBC 5.0 14.7*  HGB 13.6 11.9*  HCT 43.5 38.0  PLT 243 169   BMET Recent Labs    08/11/17 0500 08/12/17 0426  NA 147* 143  K 3.8 4.4  CL 119* 115*  CO2 20* 14*  GLUCOSE 216* 185*  BUN 28* 34*  CREATININE 1.19* 1.70*  CALCIUM 6.9* 6.6*   PT/INR Recent Labs    08/20/2017 1946  LABPROT 16.3*  INR 1.33   CMP     Component Value Date/Time   NA 143 08/12/2017 0426   K 4.4 08/12/2017 0426   CL 115 (H) 08/12/2017 0426   CO2 14 (L) 08/12/2017 0426   GLUCOSE 185 (H) 08/12/2017 0426   BUN 34 (H) 08/12/2017 0426   CREATININE 1.70 (H) 08/12/2017 0426   CALCIUM 6.6 (L) 08/12/2017 0426   PROT 6.0 (L) 07/30/2017 1946   ALBUMIN 2.5 (L) 08/25/2017 1946   AST 18 08/06/2017 1946   ALT 12 08/19/2017 1946   ALKPHOS 74 08/17/2017 1946   BILITOT 0.5  08/06/2017 1946   GFRNONAA 28 (L) 08/12/2017 0426   GFRAA 32 (L) 08/12/2017 0426   Lipase     Component Value Date/Time   LIPASE 70 (H) 08/22/2017 1954       Studies/Results: Ct Abdomen Pelvis Wo Contrast  Result Date: 08/04/2017 CLINICAL DATA:  Abdominal distension.  Follow-up pneumoperitoneum. EXAM: CT ABDOMEN AND PELVIS WITHOUT CONTRAST TECHNIQUE: Multidetector CT imaging of the abdomen and pelvis was performed following the standard protocol without IV contrast. COMPARISON:  Abdominal radiograph August 10, 2016 and CT abdomen and pelvis August 01, 2015. FINDINGS: LOWER CHEST: Bilateral lower lobe consolidation. Heart size is normal. Moderate coronary artery calcifications. No pericardial effusion. HEPATOBILIARY: Normal. PANCREAS: Normal. SPLEEN: Normal. ADRENALS/URINARY TRACT: Kidneys are orthotopic, demonstrating normal size and morphology. No nephrolithiasis, hydronephrosis; limited assessment for renal masses by nonenhanced CT. 2.5 cm dense (70 Hounsfield units) RIGHT lower pole proteinaceous/hemorrhagic cyst, no routine indicated follow-up by consensus. 9 mm dense RIGHT interpolar cyst. Isodense probable mass lower pole LEFT kidney. All renal masses appear relatively stable from prior contrast enhanced CT. The unopacified ureters are normal in course and caliber. Urinary bladder is partially distended and unremarkable. Normal adrenal glands. STOMACH/BOWEL: Moderate hiatal hernia. Large amount of stool in sigmoid colon through the  level of the anus, rectum distended to 9.1 cm. At proximal sigmoid colon is a linear intramural lucency concerning for perforation (coronal image 32), associated circumferential focal mural thickening. Adjacent feculent appearing material in adjacent peritoneum. Moderate colonic diverticulosis. Debris distended stomach. Normal appendix. VASCULAR/LYMPHATIC: Aortoiliac vessels are normal in course and caliber. Moderate to severe calcific atherosclerosis. No lymphadenopathy  by CT size criteria. REPRODUCTIVE: Normal. OTHER: Small volume ascites LEFT upper quadrant. Large volume pneumoperitoneum. MUSCULOSKELETAL: Non-acute. IMPRESSION: 1. Large volume pneumoperitoneum. Probable sigmoid bowel perforation with adjacent probable stool in peritoneal cavity. Ulcerated tumor not excluded. 2. Large volume rectosigmoid stool, rectum distended to 9.1 cm. 3. Bilateral lower lobe consolidation concerning for pneumonia. 4. Critical Value/emergent results were called by telephone at the time of interpretation on 08/08/2017 at 8:50 pm to Dr. Dorie Rank , who verbally acknowledged these results. Electronically Signed   By: Elon Alas M.D.   On: 08/25/2017 20:54   Ct Head Wo Contrast  Result Date: 08/24/2017 CLINICAL DATA:  Altered mental status from baseline. EXAM: CT HEAD WITHOUT CONTRAST TECHNIQUE: Contiguous axial images were obtained from the base of the skull through the vertex without intravenous contrast. COMPARISON:  04/23/2017 FINDINGS: Brain: Subtle hypodensity involving the left pons and midbrain may represent nonhemorrhagic infarcts, apparently new since prior comparison on 04/23/2017. Atrophy with chronic stable small vessel ischemic disease of periventricular white matter. No acute intracranial hemorrhage, midline shift or edema. No large vascular territory infarct. No intra-axial mass nor extra-axial fluid collections. Midline fourth ventricle and basal cisterns without effacement. Vascular: Hyperdense vessel sign. Skull: No acute skull fracture Sinuses/Orbits: Bilateral lens replacements. Clear paranasal sinuses. Other: Clear mastoids IMPRESSION: 1. Chronic appearing small vessel ischemic disease of periventricular white matter with atrophy. 2. Age indeterminate but possibly new since prior exam are subtle hypodensities in the left pons and midbrain suspicious for small nonhemorrhagic infarcts. Electronically Signed   By: Ashley Royalty M.D.   On: 08/02/2017 20:45   Dg Chest  Port 1 View  Result Date: 08/11/2017 CLINICAL DATA:  77 y/o F; post intubation and central line placement. EXAM: PORTABLE CHEST 1 VIEW COMPARISON:  07/29/2017 chest radiograph FINDINGS: Stable cardiomegaly given projection and technique. No appreciable pneumoperitoneum. Stable bibasilar opacities and small effusions. Endotracheal tube tip projects 3.7 cm above the carina. Right central venous catheter tip projects over right cavoatrial junction. Enteric tube tip extends below field of view into the abdomen. Bones are unremarkable. IMPRESSION: 1. Stable bibasilar atelectasis and small effusions. 2. Endotracheal tube tip 3.7 cm above the carina. 3. Enteric tube tip extends below field of view into abdomen. 4. Right central venous catheter tip projects over cavoatrial junction. Electronically Signed   By: Kristine Garbe M.D.   On: 08/11/2017 02:05   Dg Chest Port 1 View  Result Date: 08/20/2017 CLINICAL DATA:  Worsening altered mental status. History of dementia. EXAM: PORTABLE CHEST 1 VIEW COMPARISON:  Chest radiograph April 23, 2017 FINDINGS: Air underneath bilateral hemidiaphragms. Bibasilar atelectasis. Stable cardiomegaly. Calcified aortic arch. No pneumothorax. Osteopenia. IMPRESSION: 1. Pneumoperitoneum. 2. Stable cardiomegaly.  Bibasilar atelectasis/scarring. 3.  Aortic Atherosclerosis (ICD10-I70.0). 4. Critical Value/emergent results were called by telephone at the time of interpretation on 08/18/2017 at 7:23 pm to Dr. Dorie Rank , who verbally acknowledged these results. Electronically Signed   By: Elon Alas M.D.   On: 08/17/2017 19:24    Anti-infectives: Anti-infectives (From admission, onward)   Start     Dose/Rate Route Frequency Ordered Stop   08/11/17 0600  piperacillin-tazobactam (ZOSYN)  IVPB 3.375 g     3.375 g 12.5 mL/hr over 240 Minutes Intravenous Every 8 hours 08/11/17 0025     08/11/17 0115  vancomycin (VANCOCIN) IVPB 750 mg/150 ml premix     750 mg 150 mL/hr over  60 Minutes Intravenous Every 24 hours 08/11/17 0110     08/11/17 0045  piperacillin-tazobactam (ZOSYN) IVPB 3.375 g     3.375 g 100 mL/hr over 30 Minutes Intravenous  Once 08/11/17 0033 08/11/17 0206   08/18/2017 2000  piperacillin-tazobactam (ZOSYN) IVPB 3.375 g  Status:  Discontinued     3.375 g 100 mL/hr over 30 Minutes Intravenous  Once 08/15/2017 1952 08/11/17 0025       Assessment/Plan Perforated proximal descending colon POD 1, s/p extended left hemicolectomy with distal transverse colostomy and mucous fistula creation. -cont NGT for now.  She no output currently and has some BS but still on 2 pressors and would not start tube feeds while on 2 pressors. -BID midline dressing changes -WOC RN for ostomy care -cont zosyn and vanc -lWBC up to 14.5 today. follow  Sepsis -secondary to #1 -lactic acid still hasn't cleared and up to 6 again this morning with a procalcitonin of >150. -still on vaso and levo for pressor support, defer to CCM -cortisol is normal.  On prophylactic solu-cortef.  Would ideally like to minimize or stop this if not warranted to decrease risk wound breakdown etc.  VDRF -may wean to extubate per CCM  DM -SSI for now  HTN -patient is currently hypotensive secondary to septic shock.  Hold all antihypertensive meds for now.  Acute Renal Failure -Creatinine up to 1.7 today and GFR down to 28.  Uop was only about 210cc total yesterday.  She was given a 1L bolus overnight and put out about 100 of that 210cc after that.  She has made about 25cc in the last 2 hours.  CVP is 78. -cont to monitor this.  FEN - NPO, IVFs (NS and bicarb gtt), NGT  VTE - heparin 5000, SCDs ID - Zosyn 7/16-->  Vanc 7/16-->  Dispo - ICU, greatly appreciate CCM assistance with this patient and her complex care.   LOS: 2 days    Henreitta Cea , [facility] 08/12/2017, 7:54 AM Pager: [phone]

## 2017-08-13 ENCOUNTER — Inpatient Hospital Stay (HOSPITAL_COMMUNITY): Payer: Medicare Other

## 2017-08-13 LAB — TYPE AND SCREEN
ABO/RH(D): B NEG
Antibody Screen: NEGATIVE
UNIT DIVISION: 0
UNIT DIVISION: 0
UNIT DIVISION: 0
UNIT DIVISION: 0
UNIT DIVISION: 0
Unit division: 0

## 2017-08-13 LAB — CBC
HEMATOCRIT: 35.8 % — AB (ref 36.0–46.0)
HEMOGLOBIN: 11.2 g/dL — AB (ref 12.0–15.0)
MCH: 26.5 pg (ref 26.0–34.0)
MCHC: 31.3 g/dL (ref 30.0–36.0)
MCV: 84.8 fL (ref 78.0–100.0)
Platelets: 117 10*3/uL — ABNORMAL LOW (ref 150–400)
RBC: 4.22 MIL/uL (ref 3.87–5.11)
RDW: 19.8 % — ABNORMAL HIGH (ref 11.5–15.5)
WBC: 18.4 10*3/uL — ABNORMAL HIGH (ref 4.0–10.5)

## 2017-08-13 LAB — BPAM RBC
BLOOD PRODUCT EXPIRATION DATE: 201907312359
BLOOD PRODUCT EXPIRATION DATE: 201908042359
Blood Product Expiration Date: 201907252359
Blood Product Expiration Date: 201907262359
Blood Product Expiration Date: 201908062359
Blood Product Expiration Date: 201908152359
ISSUE DATE / TIME: 201907101316
ISSUE DATE / TIME: 201907162222
ISSUE DATE / TIME: 201907162245
ISSUE DATE / TIME: 201907162222
ISSUE DATE / TIME: 201907162245
UNIT TYPE AND RH: 1700
UNIT TYPE AND RH: 1700
UNIT TYPE AND RH: 9500
Unit Type and Rh: 1700
Unit Type and Rh: 1700
Unit Type and Rh: 9500

## 2017-08-13 LAB — BLOOD GAS, ARTERIAL
Acid-base deficit: 10.6 mmol/L — ABNORMAL HIGH (ref 0.0–2.0)
Bicarbonate: 14.1 mmol/L — ABNORMAL LOW (ref 20.0–28.0)
Drawn by: 24513
FIO2: 40
LHR: 15 {breaths}/min
O2 SAT: 96 %
PATIENT TEMPERATURE: 98.7
PCO2 ART: 27.3 mmHg — AB (ref 32.0–48.0)
PEEP: 5 cmH2O
PH ART: 7.333 — AB (ref 7.350–7.450)
VT: 360 mL
pO2, Arterial: 84 mmHg (ref 83.0–108.0)

## 2017-08-13 LAB — PROCALCITONIN: Procalcitonin: >150 ng/mL

## 2017-08-13 LAB — GLUCOSE, CAPILLARY
GLUCOSE-CAPILLARY: 103 mg/dL — AB (ref 70–99)
GLUCOSE-CAPILLARY: 87 mg/dL (ref 70–99)
Glucose-Capillary: 67 mg/dL — ABNORMAL LOW (ref 70–99)
Glucose-Capillary: 83 mg/dL (ref 70–99)
Glucose-Capillary: 85 mg/dL (ref 70–99)
Glucose-Capillary: 79 mg/dL (ref 70–99)

## 2017-08-13 LAB — BASIC METABOLIC PANEL
Anion gap: 12 (ref 5–15)
BUN: 34 mg/dL — ABNORMAL HIGH (ref 8–23)
CHLORIDE: 115 mmol/L — AB (ref 98–111)
CO2: 16 mmol/L — AB (ref 22–32)
Calcium: 6.8 mg/dL — ABNORMAL LOW (ref 8.9–10.3)
Creatinine, Ser: 1.73 mg/dL — ABNORMAL HIGH (ref 0.44–1.00)
GFR calc Af Amer: 31 mL/min — ABNORMAL LOW (ref >60)
GFR calc non Af Amer: 27 mL/min — ABNORMAL LOW (ref >60)
Glucose, Bld: 114 mg/dL — ABNORMAL HIGH (ref 70–99)
POTASSIUM: 4.4 mmol/L (ref 3.5–5.1)
SODIUM: 143 mmol/L (ref 135–145)

## 2017-08-13 LAB — PHOSPHORUS
PHOSPHORUS: 5.3 mg/dL — AB (ref 2.5–4.6)
Phosphorus: 5.2 mg/dL — ABNORMAL HIGH (ref 2.5–4.6)

## 2017-08-13 LAB — VANCOMYCIN, TROUGH: VANCOMYCIN TR: 14 ug/mL — AB (ref 15–20)

## 2017-08-13 LAB — MAGNESIUM
Magnesium: 1.3 mg/dL — ABNORMAL LOW (ref 1.7–2.4)
Magnesium: 1.3 mg/dL — ABNORMAL LOW (ref 1.7–2.4)

## 2017-08-13 LAB — LACTIC ACID, PLASMA: Lactic Acid, Venous: 3.6 mmol/L (ref 0.5–1.9)

## 2017-08-13 MED ORDER — SODIUM CHLORIDE 0.45 % IV BOLUS
1000.0000 mL | Freq: Once | INTRAVENOUS | Status: DC
  Administered 2017-08-13: 1000 mL via INTRAVENOUS

## 2017-08-13 MED ORDER — ARTIFICIAL TEARS OPHTHALMIC OINT
TOPICAL_OINTMENT | Freq: Three times a day (TID) | OPHTHALMIC | Status: DC
  Administered 2017-08-13 (×3): via OPHTHALMIC
  Administered 2017-08-14: 1 via OPHTHALMIC
  Filled 2017-08-13 (×2): qty 3.5

## 2017-08-13 MED ORDER — VITAL HIGH PROTEIN PO LIQD
1000.0000 mL | ORAL | Status: DC
  Administered 2017-08-13 – 2017-08-15 (×3): 1000 mL

## 2017-08-13 MED ORDER — SODIUM CHLORIDE 0.45 % IV SOLN
INTRAVENOUS | Status: DC
  Administered 2017-08-13 – 2017-08-15 (×5): via INTRAVENOUS

## 2017-08-13 MED ORDER — FAMOTIDINE IN NACL 20-0.9 MG/50ML-% IV SOLN
20.0000 mg | INTRAVENOUS | Status: DC
  Administered 2017-08-13: 20 mg via INTRAVENOUS
  Filled 2017-08-13: qty 50

## 2017-08-13 MED ORDER — SODIUM CHLORIDE 0.9 % IV BOLUS
1000.0000 mL | Freq: Once | INTRAVENOUS | Status: AC
  Administered 2017-08-13: 1000 mL via INTRAVENOUS

## 2017-08-13 MED ORDER — SODIUM CHLORIDE 0.45 % IV BOLUS: 1000.0000 mL | Freq: Once | INTRAVENOUS | Status: DC

## 2017-08-13 NOTE — Progress Notes (Signed)
Nutrition Follow-up  DOCUMENTATION CODES:   Obesity unspecified  INTERVENTION:    Trickle TF with Vital High Protein at 10 ml/h.  When tolerance established, recommend advance rate by 10 ml every 4 hours to goal rate of 50 ml/h (1200 ml per day) to provide 1200 kcal, 105 gm protein, 1003 ml free water daily.  NUTRITION DIAGNOSIS:   Inadequate oral intake related to inability to eat, altered GI function as evidenced by NPO status.  Ongoing  GOAL:   Provide needs based on ASPEN/SCCM guidelines  Unmet  MONITOR:   Vent status, Labs, I & O's, Skin  REASON FOR ASSESSMENT:   Consult Enteral/tube feeding initiation and management  ASSESSMENT:   77 yo female with PMH of DM, HLD, asthma, GERD, Charcot's joint, R BKA, HLD, and HTN who was admitted on 7/16 with septic shock and pneumoperitoneum; found to have perforated proximal descending colon. S/P extended L hemicolectomy with distal transverse colostomy and mucous fistula creation on 7/17.   Received MD Consult for TF initiation and management. Trickle TF has been started with Vital High Protein at 10 ml/h. Remains on 2 pressors. Patient is currently intubated on ventilator support  Temp (24hrs), Avg:98.6 F (37 C), Min:97.7 F (36.5 C), Max:99.4 F (37.4 C)   Labs reviewed. Phosphorus 5.3 (H), magnesium 1.3 (L) Medications reviewed and include levophed and vasopressin.   Diet Order:   Diet Order           Diet NPO time specified  Diet effective now          EDUCATION NEEDS:   No education needs have been identified at this time  Skin:  Skin Assessment: Skin Integrity Issues: Skin Integrity Issues:: Diabetic Ulcer, Incisions Diabetic Ulcer: L foot Incisions: abdomen  Last BM:  7/19 (colostomy)  Height:   Ht Readings from Last 1 Encounters:  08/11/17 5' (1.524 m)    Weight:   Wt Readings from Last 1 Encounters:  08/13/17 190 lb 0.6 oz (86.2 kg)    Ideal Body Weight:  42.5 kg  BMI:  Body mass  index is 37.11 kg/m.  Estimated Nutritional Needs:   Kcal:  1000-1200  Protein:  >/= 85 gm  Fluid:  1.5 L    Molli Barrows, RD, LDN, [place] Pager [phone] After Hours Pager [phone]

## 2017-08-13 NOTE — Progress Notes (Addendum)
Patient ID: Judith [name], female   DOB: [birth year], 77 y.o.   MRN: [id number]   Acute Care Surgery Service Progress Note:    Chief Complaint/Subjective: BP variable - still on 2 pressors Eraxis added yesterday Minimal NG output uop picked up some yesterday  Objective: Vital signs in last 24 hours: Temp:  [97.6 F (36.4 C)-99.4 F (37.4 C)] 99.4 F (37.4 C) (07/19 0724) Pulse Rate:  [105-132] 132 (07/19 0800) Resp:  [18-32] 19 (07/19 0800) BP: (86-171)/(49-131) 131/78 (07/19 0800) SpO2:  [92 %-100 %] 97 % (07/19 0800) Arterial Line BP: (71-199)/(35-85) 118/85 (07/19 0800) FiO2 (%):  [35 %-60 %] 35 % (07/19 0729) Weight:  [86.2 kg (190 lb 0.6 oz)] 86.2 kg (190 lb 0.6 oz) (07/19 0600) Last BM Date: 08/11/17  Intake/Output from previous day: 07/18 0701 - 07/19 0700 In: 5813.1 [I.V.:3661.3; IV Piggyback:2151.8] Out: 1500 [Urine:1200; Stool:300] Intake/Output this shift: No intake/output data recorded.  Lungs: cta, nonlabored; on vent; on fent gtt  Cardiovascular: tachy  Abd: soft, mild distended. End colostomy - viable, stool in bag; MF - stool in bag  Extremities: no edema, +SCDs; RLE amputation  Neuro: intubated, eyes open, no follow commands  Lab Results: CBC  Recent Labs    08/12/17 0426 08/13/17 0258  WBC 14.7* 18.4*  HGB 11.9* 11.2*  HCT 38.0 35.8*  PLT 169 117*   BMET Recent Labs    08/12/17 1615 08/13/17 0258  NA 142 143  K 4.5 4.4  CL 114* 115*  CO2 14* 16*  GLUCOSE 159* 114*  BUN 34* 34*  CREATININE 1.71* 1.73*  CALCIUM 6.3* 6.8*   LFT Hepatic Function Latest Ref Rng & Units 08/06/2017 04/26/2017 04/25/2017  Total Protein 6.5 - 8.1 g/dL 6.0(L) 5.5(L) 4.9(L)  Albumin 3.5 - 5.0 g/dL 2.5(L) 2.4(L) 2.1(L)  AST 15 - 41 U/L 18 15 12(L)  ALT 0 - 44 U/L 12 7(L) 7(L)  Alk Phosphatase 38 - 126 U/L 74 57 50  Total Bilirubin 0.3 - 1.2 mg/dL 0.5 0.7 0.6   PT/INR Recent Labs    08/22/2017 1946  LABPROT 16.3*  INR 1.33   ABG Recent Labs     08/12/17 1440 08/13/17 0334  PHART 7.251* 7.333*  HCO3 11.7* 14.1*    Studies/Results:  Anti-infectives: Anti-infectives (From admission, onward)   Start     Dose/Rate Route Frequency Ordered Stop   08/13/17 1100  anidulafungin (ERAXIS) 100 mg in sodium chloride 0.9 % 100 mL IVPB     100 mg 78 mL/hr over 100 Minutes Intravenous Every 24 hours 08/12/17 1048     08/12/17 1100  anidulafungin (ERAXIS) 200 mg in sodium chloride 0.9 % 200 mL IVPB     200 mg 78 mL/hr over 200 Minutes Intravenous  Once 08/12/17 1048 08/12/17 1648   08/11/17 0600  piperacillin-tazobactam (ZOSYN) IVPB 3.375 g     3.375 g 12.5 mL/hr over 240 Minutes Intravenous Every 8 hours 08/11/17 0025     08/11/17 0115  vancomycin (VANCOCIN) IVPB 750 mg/150 ml premix     750 mg 150 mL/hr over 60 Minutes Intravenous Every 24 hours 08/11/17 0110     08/11/17 0045  piperacillin-tazobactam (ZOSYN) IVPB 3.375 g     3.375 g 100 mL/hr over 30 Minutes Intravenous  Once 08/11/17 0033 08/11/17 0206   08/09/2017 2000  piperacillin-tazobactam (ZOSYN) IVPB 3.375 g  Status:  Discontinued     3.375 g 100 mL/hr over 30 Minutes Intravenous  Once 08/07/2017 1952 08/11/17 0025  Medications: Scheduled Meds: . chlorhexidine gluconate (MEDLINE KIT)  15 mL Mouth Rinse BID  . heparin injection (subcutaneous)  5,000 Units Subcutaneous Q8H  . hydrocortisone sod succinate (SOLU-CORTEF) inj  50 mg Intravenous Q6H  . insulin aspart  2-6 Units Subcutaneous Q4H  . mouth rinse  15 mL Mouth Rinse 10 times per day   Continuous Infusions: . sodium chloride 100 mL/hr at 08/13/17 0700  . sodium chloride Stopped (08/13/17 0515)  . anidulafungin    . famotidine (PEPCID) IV Stopped (08/12/17 2235)  . fentaNYL infusion INTRAVENOUS 100 mcg/hr (08/13/17 0700)  . norepinephrine (LEVOPHED) Adult infusion 19 mcg/min (08/13/17 0700)  . piperacillin-tazobactam (ZOSYN)  IV 12.5 mL/hr at 08/13/17 0700  . sodium chloride Stopped (08/11/17 0209)   And  .  sodium chloride    . vancomycin Stopped (08/13/17 0216)  . vasopressin (PITRESSIN) infusion - *FOR SHOCK* 0.03 Units/min (08/13/17 0700)   PRN Meds:.sodium chloride, albuterol, fentaNYL, midazolam  Assessment/Plan: Patient Active Problem List   Diagnosis Date Noted  . Acute respiratory failure with hypoxia ([place])   . S/P exploratory laparotomy   . Septic shock ([place])   . Perforated viscus 08/21/2017  . Encounter for hospice care discussion   . Palliative care encounter   . Acute UTI   . AKI (acute kidney injury) ([place])   . Osteomyelitis of left foot ([place])   . UTI (urinary tract infection) 04/23/2017  . Osteomyelitis due to type 2 diabetes mellitus ([place]) 04/23/2017  . Uterine hypertrophy 06/11/2015  . Kidney lesion, native, bilateral 06/11/2015  . Protein-calorie malnutrition, severe ([place]) 09/22/2013  . Dementia 09/21/2013  . Constipation 09/21/2013  . Rectal bleeding 09/21/2013  . Fecal impaction ([place]) 09/21/2013  . BRBPR (bright red blood per rectum) 09/21/2013  . Altered mental status 05/17/2013  . Altered mental state 05/17/2013  . Delirium due to another medical condition 02/18/2013  . Acute blood loss anemia 03/03/2012  . Hx of right BKA ([place]) 03/03/2012  . Fall 03/03/2012  . Obesity (BMI 30-39.9) 03/03/2012  . Poor nutrition 03/03/2012  . Fracture of femur, distal, right, closed ([place]) 03/01/2012  . Lower urinary tract infectious disease 03/01/2012  . Insulin dependent diabetes mellitus ([place]) 03/01/2012  . Hypertension    sPerforated proximal descending colon POD 2, s/p extended left hemicolectomy with distal transverse colostomy and mucous fistula creation Dr Kae Heller. -min NG output, having some ostomy output, can start some trickle tube feeds but not sure how will tolerate given 2 pressors -BID midline dressing changes -WOC RN for ostomy care -cont zosyn and vanc, Eraxis added 7/18 -[place] up to 18 today. follow  Sepsis -secondary to #1 -lactic acid still hasn't  cleared but most recent LA is trending down -still on vaso and levo for pressor support, defer to CCM; wean for MAP >65 -cortisol is normal. On prophylactic solu-cortef. Would ideally like to minimize or stop this if not warranted to decrease risk wound breakdown etc.  VDRF -may wean to extubate per CCM  DM -SSI for now  HTN -patient is currently hypotensive secondary to septic shock. Hold all antihypertensive meds for now.  Acute Renal Failure -Creatinine stable1.7 today and GFR down to 28.  Uop has picked up from 210 day before to 1200 /24hrs.  volume resuscitate as needed -cont to monitor this.  FEN -NPO, IVFs (NS and bicarb gtt), NGT, min NG tube output, having some ostomy function; start some Trickle tube feeds VTE -heparin 5000, SCDs ID -Zosyn 7/16-->Vanc 7/16--> Eraxis 7/18 -->  Dispo -  ICU, greatly appreciate CCM assistance with this patient and her complex care.   Disposition:  LOS: 3 days    Leighton Ruff. Redmond Pulling, MD, FACS General, Bariatric, & Minimally Invasive Surgery [phone] [facility]

## 2017-08-13 NOTE — Progress Notes (Signed)
Notified Elink about critical lab, lactic acid 3.6, trending down from 6.0 yesterday. Will continue to monitor.

## 2017-08-13 NOTE — Progress Notes (Signed)
Pharmacy Antibiotic Note  Judith [name] is a 77 y.o. female admitted on 08/23/2017 with perforated colon.  Pharmacy has been consulted for Vancomycin/Zosyn dosing. WBC 12.8. Noted renal dysfunction. Pt now post-op.   7/19 AM update: vancomycin trough is 14 this AM, will not increase dose given bump in Scr yesterday and likely accumulation over the next few days  Plan: Cont Vancomycin 750 mg IV q24h Zosyn 3.375G IV q8h to be infused over 4 hours Trend WBC, temp, renal function  F/U infectious work-up Repeat vancomycin trough as needed   Height: 5' (152.4 cm) IBW/kg (Calculated) : 45.5  Temp (24hrs), Avg:98.1 F (36.7 C), Min:97.6 F (36.4 C), Max:98.8 F (37.1 C)  Recent Labs  Lab 08/09/2017 1946 08/11/2017 2011 08/11/17 0100 08/11/17 0359 08/11/17 0500 08/12/17 0426 08/12/17 1615 08/13/17 0004  WBC 12.8*  --   --   --  5.0 14.7*  --   --   CREATININE 1.60*  --   --   --  1.19* 1.70* 1.71*  --   LATICACIDVEN  --  6.41* 4.4* 3.5*  --  6.3* 6.0*  --   VANCOTROUGH  --   --   --   --   --   --   --  14*    CrCl cannot be calculated (Unknown ideal weight.).    No Known Allergies   [name] 08/13/2017 1:01 AM

## 2017-08-13 NOTE — Consult Note (Signed)
PULMONARY / CRITICAL CARE MEDICINE   Name: Judith [name] MRN: [id number] DOB: [birth year]    ADMISSION DATE:  08/14/2017 CONSULTATION DATE:  08/11/17  REFERRING MD:  Kae Heller  CHIEF COMPLAINT:  Vent Management  HISTORY OF PRESENT ILLNESS:  Pt is encephelopathic; therefore, this HPI is obtained from chart review. Judith [name] is a 77 y.o. female with PMH as outlined below and who resides at nursing home.  She was brought to [facility] ED 7/16 after staff at nursing home found her with AMS.  She had been asymptomatic prior to this. In ED, she had CXR and CT A / P which showed large amount of pneumoperitoneum.  She also had hypotension that was slow to respond to IVF's. She was taken to OR by surgery where she had ex-lap which revealed perforated proximal descending colon.  She has an extended left hemicolectomy including mobilization of splenic flexure, creation of colostomy and mucous fistula.  Post OR, she returned to the ICU where she remained ventilated and on low dose pressors.  PCCM was asked to assist with vent management.  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  No Known Allergies  No  current facility-administered medications on file prior to encounter.    Current Outpatient Medications on File Prior to Encounter  Medication Sig  . acetaminophen (TYLENOL) 325 MG tablet Take 650 mg by mouth every 6 (six) hours as needed for mild pain.  . Amino Acids-Protein Hydrolys (FEEDING SUPPLEMENT, PRO-STAT SUGAR FREE 64,) LIQD Take 30 mLs by mouth 2 (two) times daily.  . Ascorbic Acid (VITAMIN C PO) Take 1 tablet by mouth 2 (two) times daily.   Marland Kitchen aspirin 325 MG tablet Take 325 mg by mouth daily with breakfast.   . atorvastatin (LIPITOR) 20 MG tablet Take 20 mg by mouth every evening.   Marland Kitchen Dextrose, Diabetic Use, (INSTA-GLUCOSE) 77.4 % GEL Take 1 vial by mouth daily as needed (Diabetes, Give when CBG is <60).  . insulin aspart (NOVOLOG) 100 UNIT/ML injection Inject 0-15 Units into the skin See admin instructions. Three times daily Per sliding scale   0-150 = 0 units 151-200 = 3 units 201-250 = 5 units 251-300 = 7 units 301-350 = 9 units 351-400 = 11 units 401-450 = 13 units 451-500 = 15 units and call provider  . insulin detemir (LEVEMIR) 100 UNIT/ML injection Inject 0.06 mLs (6 Units total) into the skin daily. (Patient taking differently: Inject 15 Units into the skin every 12 (twelve) hours. )  . loperamide (IMODIUM) 2 MG capsule Take 2 mg by mouth every 8 (eight) hours as needed for diarrhea or loose stools.  . magnesium hydroxide (MILK OF MAGNESIA) 400 MG/5ML suspension Take 10 mLs by mouth daily as needed for mild constipation.   . megestrol (MEGACE) 400 MG/10ML suspension Take 800 mg by mouth daily.  . metoprolol succinate (TOPROL-XL) 25 MG 24 hr  tablet Take 75 mg by mouth daily with breakfast. Take with or immediately following a meal.   . mirtazapine (REMERON) 7.5 MG tablet Take 7.5 mg by mouth every other day.  . polyethylene glycol (MIRALAX / GLYCOLAX) packet Take 17 g by mouth daily. (Patient taking differently: Take 17 g by mouth daily as needed for mild constipation. )   . Probiotic Product (PROBIOTIC PO) Take 1 capsule by mouth daily.  . promethazine (PHENERGAN) 25 MG tablet Take 25 mg by mouth every 6 (six) hours as needed for nausea or vomiting.  . ranitidine (ZANTAC) 150 MG tablet Take 150 mg by mouth daily with supper.   . zinc gluconate 50 MG tablet Take 50 mg by mouth daily with breakfast.    FAMILY HISTORY:  Her indicated that the status of her mother is unknown. She indicated that the status of her father is unknown. She indicated that the status of her sister is unknown. She indicated that the status of her brother is unknown. She indicated that the status of her neg hx is unknown.   SOCIAL HISTORY: She  reports that she quit smoking about 24 years ago. She has a 5.00 pack-year smoking history. She has quit using smokeless tobacco. She reports that she does not drink alcohol or use drugs.  REVIEW OF SYSTEMS:  Unable to obtain as pt is encephalopathic.  SUBJECTIVE:  On vent, sedated.  VITAL SIGNS: BP 136/78   Pulse (!) 118   Temp 99.4 F (37.4 C) (Oral)   Resp (!) 23   Ht 5' (1.524 m)   Wt 190 lb 0.6 oz (86.2 kg)   SpO2 96%   BMI 37.11 kg/m   HEMODYNAMICS: CVP:  [11 mmHg] 11 mmHg  VENTILATOR SETTINGS: Vent Mode: PRVC FiO2 (%):  [35 %-60 %] 35 % Set Rate:  [15 bmp] 15 bmp Vt Set:  [360 mL] 360 mL PEEP:  [5 cmH20] 5 cmH20 Plateau Pressure:  [10 cmH20-15 cmH20] 15 cmH20  INTAKE / OUTPUT: I/O last 3 completed shifts: In: 8107.1 [I.V.:4713.5; IV Piggyback:3393.5] Out: 3825 [KNLZJ:6734; Emesis/NG output:200; Stool:300]   PHYSICAL EXAMINATION: General: Elderly appearing female, resting in bed, in NAD.  Opened eyes spontaneously and moved extremities. Neuro: Sedated, minimally responsive HEENT: [place]/AT. ETT in place. Cardiovascular: regular rhythm, tachycardic, no M/R/G.  Lungs: Respirations even and unlabored.  CTAB Abdomen: Midline dressings C/D/I.  Colostomy bag in place with loose brown stool.  BS hypoactive. Abdomen soft.   Musculoskeletal: R BKA, no edema.  Some bruising on L foot. Skin: Intact, warm, no rashes.   LABS:  BMET Recent Labs  Lab 08/12/17 0426 08/12/17 1615 08/13/17 0258  NA 143 142 143  K 4.4 4.5 4.4  CL 115* 114* 115*  CO2 14* 14* 16*  BUN 34* 34* 34*  CREATININE 1.70* 1.71* 1.73*  GLUCOSE 185* 159* 114*    Electrolytes Recent Labs  Lab 08/11/17 0500 08/12/17 0426 08/12/17 1615 08/13/17 0258  CALCIUM 6.9* 6.6* 6.3* 6.8*  MG 1.5*  --   --   --   PHOS  --  5.0*  --   --     CBC Recent Labs  Lab 08/11/17 0500 08/12/17 0426 08/13/17 0258  WBC 5.0 14.7* 18.4*  HGB 13.6 11.9* 11.2*  HCT 43.5 38.0 35.8*  PLT 243 169 117*    Coag's Recent Labs  Lab 07/31/2017 1946  APTT 22*  INR 1.33    Sepsis Markers Recent Labs  Lab 08/11/17 0055  08/12/17 0426 08/12/17 1615  08/13/17 0258  LATICACIDVEN  --    < > 6.3* 6.0* 3.6*  PROCALCITON 45.55  --  >150.00  --  >150.00   < > = values in this interval not displayed.    ABG Recent Labs  Lab 08/12/17 0600 08/12/17 1440 08/13/17 0334  PHART 7.219* 7.251* 7.333*  PCO2ART 30.2* 26.4* 27.3*  PO2ART 101.0 76.0* 84.0    Liver Enzymes Recent Labs  Lab 08/03/2017 1946  AST 18  ALT 12  ALKPHOS 74  BILITOT 0.5  ALBUMIN 2.5*    Cardiac Enzymes No results for input(s): TROPONINI, PROBNP in the last 168 hours.  Glucose Recent Labs  Lab 08/12/17 1114 08/12/17 1519 08/12/17 2000 08/12/17 2350 08/13/17 0258 08/13/17 0722  GLUCAP 103* 140* 124* 92 103* 87    Imaging Dg Chest Port 1 View  Result Date: 08/12/2017 CLINICAL DATA:  Acute respiratory failure EXAM: PORTABLE CHEST 1 VIEW COMPARISON:  08/11/2017 at 1:35 a.m. FINDINGS: Endotracheal tube tip has been retracted and is just above the level of the clavicular heads. Right internal jugular vein approach central venous catheter tip is in the low right atrium. Small left pleural effusion with associated atelectasis. IMPRESSION: 1. Endotracheal tube tip just  above the level of the clavicular heads. 2. Low right atrium positioning of central venous catheter. Correlate with EKG. 3. Small left pleural effusion and associated atelectasis. Electronically Signed   By: Ulyses Jarred M.D.   On: 08/12/2017 13:51     STUDIES:  CT head 7/16 > chronic small vessel ischemic disease. CT A / P 7/16 > large volume pneumoperitoneum, probable sigmoid perf, b/l lower lobe consolidation.  CULTURES: Blood 7/16 > NG1D Tracheal aspirate 7/17 > NG<24Hrs  ANTIBIOTICS: Vanc 7/17 >  Zosyn 7/17 >   SIGNIFICANT EVENTS: 7/16 > admit, to OR for ex-lap. 7/17 > remains intubated after hemicolectomy and colostomy placement 7/18 > worsening labs overnight, poor UOP, HCO3 infusion started 7/19 > improved ABG, improved lactic acid  LINES/TUBES: ETT 7/16 >  R IJ CVL 7/16 >  L radial art line 7/16 >   DISCUSSION: 77 y.o. female from nursing home, admitted 7/16 with pneumoperitoneum.  Taken for ex-lap where she was found to have perforated proximal descending colon which was repaired and colostomy performed.  ASSESSMENT / PLAN:  PULMONARY A: Respiratory insufficiency - remains intubated post op. Possible HCAP. Hx asthma. CXR on 7/19 stable from 7/18, improved diaphragmatic borders compared to 7/17 P:   Full vent support. Wean as able. VAP prevention measures. See ID section. Albuterol PRN. Obtain daily CXR  CARDIOVASCULAR A:  Septic shock - due to peritonitis +/- HCAP.  A repeat sepsis assessment has been performed. Hx HTN, HLD.  Cortisol level 14.2 (below threshold for stopping stress dose solucortef). CVP=2 P:  Continue levophed (currently 13 mcg/min) and vasopressin as needed, goal MAP > 65. Stop solucortef today; will monitor need for additional pressors Trend lactate - 6.41>4.4>3.5>6.3>3.6  since admission Hold preadmission ASA, atorvastatin, clonidine, toprol-xl. Wean levophed as able Give 1L NS bolus x 2 d/t low CVP 7/19  RENAL A:    Hypokalemia on admission, now normalized to 4.4 AKI, creatinine stable at 1.73 on 7/19, UOP 1.2 L in last 24 hours AGMA due to lactic acidosis on admission, worsened since 7/17 with gap of 8>14 Hypocalcemia, continued Hyperchloremia to 115, stable P:   Change mIVF to 1/2NS @ 100 ml/hr BMP daily.  GASTROINTESTINAL A:   Pneumoperitoneum - due to perforated descending colon.  S/p ex-lap with  repair and creation of colostomy. GI prophylaxis. Nutrition. Hx peptic ulcers, GERD, colitis. Consider stopping solucortef to aid wound healing. P:   Post op care per CCS. SUP: Famotidine. NPO.  HEMATOLOGIC A:   VTE Prophylaxis. P:  SCD's / heparin. CBC in AM.  INFECTIOUS A:   Septic shock - due to peritonitis.  Hypothermic this morning with temp of 94.2.  A repeat sepsis assessment has been performed. WBC elevated from 14.7>18.4 on 7/19 P:   Abx as above (vanc / zosyn). Continue eraxis Follow cultures as above. Follow PCT.  ENDOCRINE A:   Hx DM.  Sugars well controlled in mid to upper 100s overnight. P:   SSI. Hold preadmission novolog, levemir.  NEUROLOGIC A:   Sedation needs due to mechanical ventilation. Hx anxiety. P:   Sedation:  Fentanyl gtt / Midazolam PRN. RASS goal: 0 to -1. Daily WUA. Hold preadmission lorazepam, mirtazapine.  Family updated: None available.  Interdisciplinary Family Meeting v Palliative Care Meeting:  Due by: 08/18/17.  CC time: 35 min.  Judith [name] C. Shan Levans, MD PGY-2, [facility] 08/13/2017 8:06 AM  Pager: [phone]  or [phone] 08/13/2017, 8:06 AM

## 2017-08-14 ENCOUNTER — Inpatient Hospital Stay (HOSPITAL_COMMUNITY): Payer: Medicare Other

## 2017-08-14 LAB — CBC
HCT: 32.3 % — ABNORMAL LOW (ref 36.0–46.0)
Hemoglobin: 10.2 g/dL — ABNORMAL LOW (ref 12.0–15.0)
MCH: 27.1 pg (ref 26.0–34.0)
MCHC: 31.6 g/dL (ref 30.0–36.0)
MCV: 85.9 fL (ref 78.0–100.0)
Platelets: 90 10*3/uL — ABNORMAL LOW (ref 150–400)
RBC: 3.76 MIL/uL — AB (ref 3.87–5.11)
RDW: 20.2 % — ABNORMAL HIGH (ref 11.5–15.5)
WBC: 14.3 10*3/uL — AB (ref 4.0–10.5)

## 2017-08-14 LAB — GLUCOSE, CAPILLARY
GLUCOSE-CAPILLARY: 107 mg/dL — AB (ref 70–99)
GLUCOSE-CAPILLARY: 115 mg/dL — AB (ref 70–99)
GLUCOSE-CAPILLARY: 117 mg/dL — AB (ref 70–99)
GLUCOSE-CAPILLARY: 78 mg/dL (ref 70–99)
Glucose-Capillary: 109 mg/dL — ABNORMAL HIGH (ref 70–99)
Glucose-Capillary: 133 mg/dL — ABNORMAL HIGH (ref 70–99)
Glucose-Capillary: 17 mg/dL — CL (ref 70–99)
Glucose-Capillary: 89 mg/dL (ref 70–99)

## 2017-08-14 LAB — CULTURE, RESPIRATORY W GRAM STAIN: Culture: NORMAL

## 2017-08-14 LAB — BASIC METABOLIC PANEL
ANION GAP: 12 (ref 5–15)
BUN: 37 mg/dL — ABNORMAL HIGH (ref 8–23)
CALCIUM: 6.2 mg/dL — AB (ref 8.9–10.3)
CO2: 13 mmol/L — AB (ref 22–32)
Chloride: 115 mmol/L — ABNORMAL HIGH (ref 98–111)
Creatinine, Ser: 1.74 mg/dL — ABNORMAL HIGH (ref 0.44–1.00)
GFR calc non Af Amer: 27 mL/min — ABNORMAL LOW (ref >60)
GFR, EST AFRICAN AMERICAN: 31 mL/min — AB (ref >60)
Glucose, Bld: 123 mg/dL — ABNORMAL HIGH (ref 70–99)
Potassium: 4.1 mmol/L (ref 3.5–5.1)
Sodium: 140 mmol/L (ref 135–145)

## 2017-08-14 LAB — PHOSPHORUS
Phosphorus: 5.3 mg/dL — ABNORMAL HIGH (ref 2.5–4.6)
Phosphorus: 5.1 mg/dL — ABNORMAL HIGH (ref 2.5–4.6)

## 2017-08-14 LAB — CULTURE, RESPIRATORY

## 2017-08-14 LAB — MAGNESIUM
MAGNESIUM: 1.3 mg/dL — AB (ref 1.7–2.4)
MAGNESIUM: 1.7 mg/dL (ref 1.7–2.4)

## 2017-08-14 LAB — LACTIC ACID, PLASMA: LACTIC ACID, VENOUS: 2 mmol/L — AB (ref 0.5–1.9)

## 2017-08-14 MED ORDER — HYPROMELLOSE (GONIOSCOPIC) 2.5 % OP SOLN
2.0000 [drp] | OPHTHALMIC | Status: DC | PRN
  Administered 2017-08-14 – 2017-08-19 (×6): 2 [drp] via OPHTHALMIC
  Filled 2017-08-14: qty 15

## 2017-08-14 MED ORDER — SODIUM CHLORIDE (HYPERTONIC) 5 % OP OINT
TOPICAL_OINTMENT | OPHTHALMIC | Status: DC | PRN
  Filled 2017-08-14 (×2): qty 3.5

## 2017-08-14 MED ORDER — POLYVINYL ALCOHOL 1.4 % OP SOLN: 2.0000 [drp] | OPHTHALMIC | Status: DC | PRN

## 2017-08-14 MED ORDER — MAGNESIUM SULFATE 2 GM/50ML IV SOLN
2.0000 g | Freq: Once | INTRAVENOUS | Status: AC
  Administered 2017-08-14: 2 g via INTRAVENOUS
  Filled 2017-08-14: qty 50

## 2017-08-14 MED ORDER — LACTATED RINGERS IV BOLUS
1000.0000 mL | Freq: Once | INTRAVENOUS | Status: AC
  Administered 2017-08-14: 1000 mL via INTRAVENOUS

## 2017-08-14 MED ORDER — PANTOPRAZOLE SODIUM 40 MG PO PACK
40.0000 mg | PACK | Freq: Every day | ORAL | Status: DC
  Administered 2017-08-14 – 2017-09-01 (×19): 40 mg
  Filled 2017-08-14 (×19): qty 20

## 2017-08-14 NOTE — Progress Notes (Addendum)
PULMONARY / CRITICAL CARE MEDICINE   Name: Judith [name] MRN: [id number] DOB: 04-26-1940    ADMISSION DATE:  07/30/2017 CONSULTATION DATE:  08/11/17  REFERRING MD:  Kae Heller  CHIEF COMPLAINT:  Vent Management  HISTORY OF PRESENT ILLNESS:  Pt is encephelopathic; therefore, this HPI is obtained from chart review. Judith [name] is a 77 y.o. female with PMH as outlined below and who resides at nursing home.  She was brought to [facility] ED 7/16 after staff at nursing home found her with AMS.  She had been asymptomatic prior to this. In ED, she had CXR and CT A / P which showed large amount of pneumoperitoneum.  She also had hypotension that was slow to respond to IVF's. She was taken to OR by surgery where she had ex-lap which revealed perforated proximal descending colon.  She has an extended left hemicolectomy including mobilization of splenic flexure, creation of colostomy and mucous fistula.  Post OR, she returned to the ICU where she remained ventilated and on low dose pressors.  PCCM was asked to assist with vent management.  SUBJECTIVE:   Remains on moderate dose norepinephrine, weaned slightly over the last 24 hours Remains on vasopressin Fentanyl drip CVP remains low  VITAL SIGNS: BP (!) 114/56   Pulse (!) 118   Temp 98.8 F (37.1 C) (Oral)   Resp (!) 22   Ht 5' (1.524 m)   Wt 88.3 kg (194 lb 10.7 oz)   SpO2 97%   BMI 38.02 kg/m   HEMODYNAMICS: CVP:  [2 mmHg] 2 mmHg  VENTILATOR SETTINGS: Vent Mode: PRVC FiO2 (%):  [30 %] 30 % Set Rate:  [15 bmp-19 bmp] 15 bmp Vt Set:  [360 mL] 360 mL PEEP:  [5 cmH20] 5 cmH20 Plateau Pressure:  [10 cmH20-15 cmH20] 10 cmH20  INTAKE / OUTPUT: I/O last 3 completed shifts: In: 7933.5 [I.V.:4301; NG/GT:230; IV Piggyback:3402.6] Out: 2015 [Urine:1590; Stool:425]   PHYSICAL EXAMINATION: General: Elderly appearing female, resting in bed, in NAD.  Opened eyes spontaneously and moved extremities. Neuro: Sedated, minimally responsive HEENT: [place]/AT.  ETT in place. Cardiovascular: regular rhythm, tachycardic, no M/R/G.  Lungs: Respirations even and unlabored.  CTAB Abdomen: Midline dressings C/D/I.  Colostomy bag in place with loose brown stool.  BS hypoactive. Abdomen soft.  Musculoskeletal: R BKA, no edema.  Some bruising on L foot. Skin: Intact, warm, no rashes.   LABS:  BMET Recent Labs  Lab 08/12/17 1615 08/13/17 0258 08/14/17 0454  NA 142 143 140  K 4.5 4.4 4.1  CL 114* 115* 115*  CO2 14* 16* 13*  BUN 34* 34* 37*  CREATININE 1.71* 1.73* 1.74*  GLUCOSE 159* 114* 123*    Electrolytes Recent Labs  Lab 08/12/17 1615 08/13/17 0258 08/13/17 0828 08/13/17 1701 08/14/17 0454  CALCIUM 6.3* 6.8*  --   --  6.2*  MG  --   --  1.3* 1.3* 1.3*  PHOS  --   --  5.3* 5.2* 5.3*    CBC Recent Labs  Lab 08/12/17 0426 08/13/17 0258 08/14/17 0454  WBC 14.7* 18.4* 14.3*  HGB 11.9* 11.2* 10.2*  HCT 38.0 35.8* 32.3*  PLT 169 117* 90*    Coag's Recent Labs  Lab 08/09/2017 1946  APTT 22*  INR 1.33    Sepsis Markers Recent Labs  Lab 08/11/17 0055  08/12/17 0426 08/12/17 1615 08/13/17 0258  LATICACIDVEN  --    < > 6.3* 6.0* 3.6*  PROCALCITON 45.55  --  >150.00  --  >150.00   < > =  values in this interval not displayed.    ABG Recent Labs  Lab 08/12/17 0600 08/12/17 1440 08/13/17 0334  PHART 7.219* 7.251* 7.333*  PCO2ART 30.2* 26.4* 27.3*  PO2ART 101.0 76.0* 84.0    Liver Enzymes Recent Labs  Lab 08/21/2017 1946  AST 18  ALT 12  ALKPHOS 74  BILITOT 0.5  ALBUMIN 2.5*    Cardiac Enzymes No results for input(s): TROPONINI, PROBNP in the last 168 hours.  Glucose Recent Labs  Lab 08/13/17 0258 08/13/17 0722 08/13/17 1110 08/13/17 1519 08/13/17 1946 08/14/17 0004  GLUCAP 103* 87 83 85 79 78    Imaging No results found.   STUDIES:  CT head 7/16 > chronic small vessel ischemic disease. CT A / P 7/16 > large volume pneumoperitoneum, probable sigmoid perf, b/l lower lobe  consolidation.  CULTURES: Blood 7/16 > NG1D Tracheal aspirate 7/17 > NG<24Hrs  ANTIBIOTICS: Vanc 7/17 >  Zosyn 7/17 >  Eraxis 7/18 >>   SIGNIFICANT EVENTS: 7/16 > admit, to OR for ex-lap. 7/17 > remains intubated after hemicolectomy and colostomy placement 7/18 > worsening labs overnight, poor UOP, HCO3 infusion started 7/19 > improved ABG, improved lactic acid  LINES/TUBES: ETT 7/16 >  R IJ CVL 7/16 >  L radial art line 7/16 >   DISCUSSION: 77 y.o. female from nursing home, admitted 7/16 with pneumoperitoneum.  Taken for ex-lap where she was found to have perforated proximal descending colon which was repaired and colostomy performed.  ASSESSMENT / PLAN:  PULMONARY A: Respiratory insufficiency - remains intubated post op. Possible HCAP. Hx asthma. CXR on 7/19 stable from 7/18, improved diaphragmatic borders compared to 7/17 P:   Continue PRVC Okay for spontaneous breathing trials but not safe for extubation at this time due to hemodynamics, neurological status VAP prevention orders Bronchodilators as needed Follow chest x-ray Antibiotics as below  CARDIOVASCULAR A:  Septic shock - due to peritonitis +/- HCAP.  A repeat sepsis assessment has been performed. Hx HTN, HLD.  Cortisol level 14.2 (below threshold for stopping stress dose solucortef). CVP=2 P:  Wean norepinephrine, change goal to SBP greater 100 Volume resuscitation given low CVP, bolused LR Tolerated discontinuation hydrocortisone Home blood pressure regimen on hold (clonidine, metoprolol) Aspirin, atorvastatin on hold Lactic acid has cleared  RENAL A:   Hypokalemia on admission.  Improved AKI, creatinine stable AGMA due to lactic acidosis, improved Hypocalcemia, continued Hyperchloremia to 115, stable P:   Maintenance IV fluids 0.45 NS Follow urine output, BMP Avoid nephrotoxins  GASTROINTESTINAL A:   Pneumoperitoneum - due to perforated descending colon.  S/p ex-lap with repair and  creation of colostomy. GI prophylaxis. Nutrition. Hx peptic ulcers, GERD, colitis. P:   Post op care per CCS. SUP: Famotidine changed to pantoprazole per tube given thrombocytopenia NPO. Tolerating trickle tube feeding  HEMATOLOGIC A:   VTE Prophylaxis. Thrombocytopenia, likely due to sepsis but consider other causes P:  Heparin prophylaxis; if progressive thrombocytopenia next 24 hours then will consider holding heparin, checking HIT antibodies Follow CBC  INFECTIOUS A:   Septic shock - due to peritonitis.  Hypothermic this morning with temp of 94.2.  A repeat sepsis assessment has been performed. WBC elevated from 14.7>18.4 on 7/19 P:   Continue Abx as above (vanc / zosyn). Continue eraxis Follow cultures as above. Follow PCT.  ENDOCRINE A:   Hx DM.  Sugars well controlled in mid to upper 100s overnight. P:   SSI per protocol Continue to hold preadmission novolog, levemir.  NEUROLOGIC A:  Sedation needs due to mechanical ventilation. Hx anxiety. P:   Sedation:  Fentanyl gtt / Midazolam PRN. RASS goal: 0 to -1. Daily wake-up assessment Holding preadmission mirtazapine, Ativan   Family updated: Updated her daughter at bedside 7/20  Interdisciplinary Family Meeting v Palliative Care Meeting:  Due by: 08/18/17.   Independent CC time 34 minutes  Baltazar Apo, MD, PhD 08/14/2017, 8:50 AM [facility] [phone] or if no answer [phone]

## 2017-08-14 NOTE — Progress Notes (Signed)
PCCM Interval Note  I spoke with the patient's daughter [name] by phone.  Updated her about the patient's status, progress, prognosis and her current interventions.  I recommended that we continue her current level of support, wait and watch to see if she shows any interval improvement, specifically decreased pressor and sedation needs, improved mental status, ability to perform spontaneous breathing.  If no interval improvement, if worsening or status plateaus then we will need to consider how long to continue aggressive support.  She understands that this discussion may be necessary as we go forward.  I also recommended that the patient not undergo CPR/ACLS in the event of an acute decompensation.  [name] is going to discuss this with her brother Legrand Como, other family members and get back to [place] regarding a possible change in [place].  Such a change would not mean that we would continue to be aggressive with her baseline care.   Baltazar Apo, MD, PhD 08/14/2017, 9:58 AM [facility] [phone] or if no answer [phone]

## 2017-08-14 NOTE — Progress Notes (Signed)
Patient ID: Judith [name], female   DOB: 1940/09/15, 77 y.o.   MRN: [id number]    4 Days Post-Op  Subjective: Pt still on vent, on pressors (trying to wean).  Tolerating TFs at 10cc/hr.  Does open eyes more when name called.   Objective: Vital signs in last 24 hours: Temp:  [97.7 F (36.5 C)-99 F (37.2 C)] 98.8 F (37.1 C) (07/20 0806) Pulse Rate:  [108-124] 118 (07/20 0800) Resp:  [17-42] 22 (07/20 0800) BP: (62-138)/(38-89) 114/56 (07/20 0800) SpO2:  [92 %-99 %] 97 % (07/20 0801) Arterial Line BP: (68-262)/(19-67) 103/40 (07/20 0700) FiO2 (%):  [30 %] 30 % (07/20 0801) Weight:  [88.3 kg (194 lb 10.7 oz)] 88.3 kg (194 lb 10.7 oz) (07/20 0435) Last BM Date: 08/13/17  Intake/Output from previous day: 07/19 0701 - 07/20 0700 In: 5714.4 [I.V.:2468.7; NG/GT:230; IV Piggyback:3015.7] Out: 1215 [Urine:940; Stool:275] Intake/Output this shift: Total I/O In: 161.6 [I.V.:117.9; NG/GT:10; IV Piggyback:33.7] Out: 96 [Urine:50]  PE: Gen: critically ill appearing on vent Heart: tachy, but regular Lungs: on vent, seems clear, a little coarse Abd: wound is clean and packed.  Tissue is dulled in color, not dead, but not terribly healthy either, fascia intact.  Ostomy with liquid feculent output, mucous fistula with air and stool present.  Lab Results:  Recent Labs    08/13/17 0258 08/14/17 0454  WBC 18.4* 14.3*  HGB 11.2* 10.2*  HCT 35.8* 32.3*  PLT 117* 90*   BMET Recent Labs    08/13/17 0258 08/14/17 0454  NA 143 140  K 4.4 4.1  CL 115* 115*  CO2 16* 13*  GLUCOSE 114* 123*  BUN 34* 37*  CREATININE 1.73* 1.74*  CALCIUM 6.8* 6.2*   PT/INR No results for input(s): LABPROT, INR in the last 72 hours. CMP     Component Value Date/Time   NA 140 08/14/2017 0454   K 4.1 08/14/2017 0454   CL 115 (H) 08/14/2017 0454   CO2 13 (L) 08/14/2017 0454   GLUCOSE 123 (H) 08/14/2017 0454   BUN 37 (H) 08/14/2017 0454   CREATININE 1.74 (H) 08/14/2017 0454   CALCIUM 6.2 (LL)  08/14/2017 0454   PROT 6.0 (L) 08/19/2017 1946   ALBUMIN 2.5 (L) 08/06/2017 1946   AST 18 08/17/2017 1946   ALT 12 08/08/2017 1946   ALKPHOS 74 07/27/2017 1946   BILITOT 0.5 08/15/2017 1946   GFRNONAA 27 (L) 08/14/2017 0454   GFRAA 31 (L) 08/14/2017 0454   Lipase     Component Value Date/Time   LIPASE 70 (H) 08/23/2017 1954       Studies/Results: Dg Chest Port 1 View  Result Date: 08/13/2017 CLINICAL DATA:  Acute respiratory failure. EXAM: PORTABLE CHEST 1 VIEW COMPARISON:  08/12/2017. FINDINGS: Endotracheal tube tip noted 3 cm above the carina. NG tube tip noted below the left hemidiaphragm. Right IJ line tip noted over the right atrium. Heart size normal. Low lung volumes mild bibasilar atelectasis/infiltrates. Small right pleural effusion cannot be excluded. No left pleural effusion noted on today's exam. No pneumothorax. IMPRESSION: 1. Endotracheal tube tip noted 3 cm above the carina. NG tube and right IJ line stable position. 2. Low lung volumes. Bibasilar atelectasis/infiltrates again noted. Small right pleural effusion cannot be excluded. No left pleural effusion noted on today's exam. No pneumothorax. Electronically Signed   By: Marcello Moores  Register   On: 08/13/2017 10:28   Dg Chest Port 1 View  Result Date: 08/12/2017 CLINICAL DATA:  Acute respiratory failure EXAM: PORTABLE CHEST  1 VIEW COMPARISON:  08/11/2017 at 1:35 a.m. FINDINGS: Endotracheal tube tip has been retracted and is just above the level of the clavicular heads. Right internal jugular vein approach central venous catheter tip is in the low right atrium. Small left pleural effusion with associated atelectasis. IMPRESSION: 1. Endotracheal tube tip just above the level of the clavicular heads. 2. Low right atrium positioning of central venous catheter. Correlate with EKG. 3. Small left pleural effusion and associated atelectasis. Electronically Signed   By: Ulyses Jarred M.D.   On: 08/12/2017 13:51     Anti-infectives: Anti-infectives (From admission, onward)   Start     Dose/Rate Route Frequency Ordered Stop   08/13/17 1100  anidulafungin (ERAXIS) 100 mg in sodium chloride 0.9 % 100 mL IVPB     100 mg 78 mL/hr over 100 Minutes Intravenous Every 24 hours 08/12/17 1048     08/12/17 1100  anidulafungin (ERAXIS) 200 mg in sodium chloride 0.9 % 200 mL IVPB     200 mg 78 mL/hr over 200 Minutes Intravenous  Once 08/12/17 1048 08/12/17 1648   08/11/17 0600  piperacillin-tazobactam (ZOSYN) IVPB 3.375 g     3.375 g 12.5 mL/hr over 240 Minutes Intravenous Every 8 hours 08/11/17 0025     08/11/17 0115  vancomycin (VANCOCIN) IVPB 750 mg/150 ml premix     750 mg 150 mL/hr over 60 Minutes Intravenous Every 24 hours 08/11/17 0110     08/11/17 0045  piperacillin-tazobactam (ZOSYN) IVPB 3.375 g     3.375 g 100 mL/hr over 30 Minutes Intravenous  Once 08/11/17 0033 08/11/17 0206   08/23/2017 2000  piperacillin-tazobactam (ZOSYN) IVPB 3.375 g  Status:  Discontinued     3.375 g 100 mL/hr over 30 Minutes Intravenous  Once 08/21/2017 1952 08/11/17 0025       Assessment/Plan Perforated proximal descending colon POD3, s/p extended left hemicolectomy with distal transverse colostomy and mucous fistula creation Dr Kae Heller. -TFs at 10cc/hr.  Would not advance until pressor needs can come down. -BID midline dressing changes -WOC RN for ostomy care -cont zosyn and vanc, Eraxis added 7/18 -WBC down to 14K today. -stoma is starting to work  Sepsis -secondary to #1 -patient still ill needing pressor support.  BP is variable as it will go up with stimulation but with rest return to hypotensive state. -cont abx therapy Thrombocytopenia -pepcid being changed to protonix per CCM -unclear if this is secondary to infection or medications or both -agree with plts continue to drop that heparin will have to likely be held  VDRF -may wean to extubate per CCM DM -SSI for now HTN -patient is currently  hypotensive secondary to septic shock. Hold all antihypertensive meds for now. Acute Renal Failure -Creatinine stable1.7 today and GFR down to 28. Uop up to 990cc yesterday. -cont to monitor this.  FEN -NPO, IVFs, TFs at 10cc/hr VTE -heparin 5000, SCDs ID -Zosyn 7/16-->Vanc 7/16--> Eraxis 7/18 -->  Dispo - ICU, greatly appreciate CCM assistance with this patient and her complex care.  Discussed her care today with her daughter at the bedside as well as Dr. Lamonte Sakai.   LOS: 4 days    Henreitta Cea , [facility] 08/14/2017, 8:50 AM Pager: [phone]

## 2017-08-14 NOTE — Progress Notes (Signed)
eLink Physician-Brief Progress Note Patient Name: Judith [name] DOB: July 20, 1940 MRN: [id number]   Date of Service  08/14/2017  HPI/Events of Note  Low magnesium  eICU Interventions  replaced     Intervention Category Minor Interventions: Electrolytes abnormality - evaluation and management  Mauri Brooklyn, P 08/14/2017, 6:28 AM

## 2017-08-15 ENCOUNTER — Inpatient Hospital Stay (HOSPITAL_COMMUNITY): Payer: Medicare Other

## 2017-08-15 LAB — CBC
HEMATOCRIT: 30.3 % — AB (ref 36.0–46.0)
HEMOGLOBIN: 9.6 g/dL — AB (ref 12.0–15.0)
MCH: 27.1 pg (ref 26.0–34.0)
MCHC: 31.7 g/dL (ref 30.0–36.0)
MCV: 85.6 fL (ref 78.0–100.0)
Platelets: 54 10*3/uL — ABNORMAL LOW (ref 150–400)
RBC: 3.54 MIL/uL — ABNORMAL LOW (ref 3.87–5.11)
RDW: 20.8 % — ABNORMAL HIGH (ref 11.5–15.5)
WBC: 13.3 10*3/uL — ABNORMAL HIGH (ref 4.0–10.5)

## 2017-08-15 LAB — BASIC METABOLIC PANEL
ANION GAP: 13 (ref 5–15)
BUN: 38 mg/dL — AB (ref 8–23)
CALCIUM: 6.4 mg/dL — AB (ref 8.9–10.3)
CO2: 11 mmol/L — ABNORMAL LOW (ref 22–32)
Chloride: 115 mmol/L — ABNORMAL HIGH (ref 98–111)
Creatinine, Ser: 2 mg/dL — ABNORMAL HIGH (ref 0.44–1.00)
GFR calc Af Amer: 26 mL/min — ABNORMAL LOW (ref >60)
GFR calc non Af Amer: 23 mL/min — ABNORMAL LOW (ref >60)
GLUCOSE: 170 mg/dL — AB (ref 70–99)
POTASSIUM: 3.5 mmol/L (ref 3.5–5.1)
Sodium: 139 mmol/L (ref 135–145)

## 2017-08-15 LAB — GLUCOSE, CAPILLARY
GLUCOSE-CAPILLARY: 127 mg/dL — AB (ref 70–99)
GLUCOSE-CAPILLARY: 127 mg/dL — AB (ref 70–99)
GLUCOSE-CAPILLARY: 118 mg/dL — AB (ref 70–99)
Glucose-Capillary: 114 mg/dL — ABNORMAL HIGH (ref 70–99)
Glucose-Capillary: 123 mg/dL — ABNORMAL HIGH (ref 70–99)
Glucose-Capillary: 140 mg/dL — ABNORMAL HIGH (ref 70–99)

## 2017-08-15 MED ORDER — SODIUM BICARBONATE 8.4 % IV SOLN
INTRAVENOUS | Status: DC
  Administered 2017-08-15 – 2017-08-19 (×7): via INTRAVENOUS
  Filled 2017-08-15 (×11): qty 100

## 2017-08-15 MED ORDER — PHENYLEPHRINE HCL-NACL 10-0.9 MG/250ML-% IV SOLN
0.0000 ug/min | INTRAVENOUS | Status: DC
Start: 2017-08-15 — End: 2017-08-16
  Administered 2017-08-15 (×4): 50 ug/min via INTRAVENOUS
  Administered 2017-08-15: 40 ug/min via INTRAVENOUS
  Administered 2017-08-15: 20 ug/min via INTRAVENOUS
  Filled 2017-08-15 (×6): qty 250

## 2017-08-15 MED ORDER — VITAL HIGH PROTEIN PO LIQD
1000.0000 mL | ORAL | Status: DC
  Administered 2017-08-15 – 2017-08-19 (×10): 1000 mL

## 2017-08-15 NOTE — Progress Notes (Addendum)
PULMONARY / CRITICAL CARE MEDICINE   Name: Judith [name] MRN: [id number] DOB: [birth year]    ADMISSION DATE:  08/05/2017 CONSULTATION DATE:  08/11/17  REFERRING MD:  Kae Heller  CHIEF COMPLAINT:  Vent Management  HISTORY OF PRESENT ILLNESS:  Pt is encephelopathic; therefore, this HPI is obtained from chart review. Judith [name] is a 77 y.o. female with PMH as outlined below and who resides at nursing home.  She was brought to [facility] ED 7/16 after staff at nursing home found her with AMS.  She had been asymptomatic prior to this. In ED, she had CXR and CT A / P which showed large amount of pneumoperitoneum.  She also had hypotension that was slow to respond to IVF's. She was taken to OR by surgery where she had ex-lap which revealed perforated proximal descending colon.  She has an extended left hemicolectomy including mobilization of splenic flexure, creation of colostomy and mucous fistula.  Post OR, she returned to the ICU where she remained ventilated and on low dose pressors.  PCCM was asked to assist with vent management.  SUBJECTIVE:   On phenylephrine, levophed stopped Remains on vasopressin Fentanyl drip at 100 mcg/hr CVP=8  VITAL SIGNS: BP 116/62   Pulse (!) 123   Temp 98.3 F (36.8 C) (Oral)   Resp 14   Ht 5' (1.524 m)   Wt 194 lb 10.7 oz (88.3 kg)   SpO2 100%   BMI 38.02 kg/m   HEMODYNAMICS:    VENTILATOR SETTINGS: Vent Mode: PRVC FiO2 (%):  [30 %] 30 % Set Rate:  [15 bmp] 15 bmp Vt Set:  [360 mL] 360 mL PEEP:  [5 cmH20] 5 cmH20 Pressure Support:  [5 cmH20] 5 cmH20 Plateau Pressure:  [14 cmH20] 14 cmH20  INTAKE / OUTPUT: I/O last 3 completed shifts: In: 7 [I.V.:3996.2; NG/GT:370; IV Piggyback:1609.8] Out: 1105 [Urine:975; Stool:130]   PHYSICAL EXAMINATION: General: Elderly appearing female, resting in bed, in NAD.  No response to stimulation. Neuro: Sedated, minimally responsive HEENT: [employer]/AT. ETT in place. Cardiovascular: regular rhythm, tachycardic, no  M/R/G.  Lungs: Respirations even and unlabored.  CTAB Abdomen: Midline dressings C/D/I.  Colostomy bag in place with loose brown stool.  BS hypoactive. Abdomen soft.  Musculoskeletal: R BKA.  Some bruising on L foot.  Upper extremities edematous. Skin: Intact, warm, no rashes.   LABS:  BMET Recent Labs  Lab 08/13/17 0258 08/14/17 0454 08/15/17 0610  NA 143 140 139  K 4.4 4.1 3.5  CL 115* 115* 115*  CO2 16* 13* 11*  BUN 34* 37* 38*  CREATININE 1.73* 1.74* 2.00*  GLUCOSE 114* 123* 170*    Electrolytes Recent Labs  Lab 08/13/17 0258  08/13/17 1701 08/14/17 0454 08/14/17 1650 08/15/17 0610  CALCIUM 6.8*  --   --  6.2*  --  6.4*  MG  --    < > 1.3* 1.3* 1.7  --   PHOS  --    < > 5.2* 5.3* 5.1*  --    < > = values in this interval not displayed.    CBC Recent Labs  Lab 08/13/17 0258 08/14/17 0454 08/15/17 0610  WBC 18.4* 14.3* 13.3*  HGB 11.2* 10.2* 9.6*  HCT 35.8* 32.3* 30.3*  PLT 117* 90* 54*    Coag's Recent Labs  Lab 07/28/2017 1946  APTT 22*  INR 1.33    Sepsis Markers Recent Labs  Lab 08/11/17 0055  08/12/17 0426 08/12/17 1615 08/13/17 0258 08/14/17 1454  LATICACIDVEN  --    < >  6.3* 6.0* 3.6* 2.0*  PROCALCITON 45.55  --  >150.00  --  >150.00  --    < > = values in this interval not displayed.    ABG Recent Labs  Lab 08/12/17 0600 08/12/17 1440 08/13/17 0334  PHART 7.219* 7.251* 7.333*  PCO2ART 30.2* 26.4* 27.3*  PO2ART 101.0 76.0* 84.0    Liver Enzymes Recent Labs  Lab 08/17/2017 1946  AST 18  ALT 12  ALKPHOS 74  BILITOT 0.5  ALBUMIN 2.5*    Cardiac Enzymes No results for input(s): TROPONINI, PROBNP in the last 168 hours.  Glucose Recent Labs  Lab 08/14/17 1214 08/14/17 1632 08/14/17 1930 08/14/17 2343 08/14/17 2345 08/15/17 0423  GLUCAP 107* 115* 117* 17* 133* 127*    Imaging No results found.   STUDIES:  CT head 7/16 > chronic small vessel ischemic disease. CT A / P 7/16 > large volume pneumoperitoneum,  probable sigmoid perf, b/l lower lobe consolidation.  CULTURES: Blood 7/16 > NG1D Tracheal aspirate 7/17 > NG<24Hrs  ANTIBIOTICS: Vanc 7/17 >  Zosyn 7/17 >  Eraxis 7/18 >>   SIGNIFICANT EVENTS: 7/16 > admit, to OR for ex-lap. 7/17 > remains intubated after hemicolectomy and colostomy placement 7/18 > worsening labs overnight, poor UOP, HCO3 infusion started 7/19 > improved ABG, improved lactic acid 7/20 > atrial fibrillation with rate into 130s, norepinephrine changed to phenylephrine   LINES/TUBES: ETT 7/16 >  R IJ CVL 7/16 >  L radial art line 7/16 >   DISCUSSION: 77 y.o. female from nursing home, admitted 7/16 with pneumoperitoneum.  Taken for ex-lap where she was found to have perforated proximal descending colon which was repaired and colostomy performed.  ASSESSMENT / PLAN:  PULMONARY A: Respiratory insufficiency - remains intubated post op. Possible HCAP. Hx asthma. CXR on 7/21 with loss of L hemidiaphragm, could be due to hypervolemia P:   Continue PRVC Okay for spontaneous breathing trials but not safe for extubation at this time due to hemodynamics, neurological status VAP prevention orders Bronchodilators as needed Follow chest x-ray Antibiotics as below  CARDIOVASCULAR A:  Septic shock - due to peritonitis +/- HCAP.  A repeat sepsis assessment has been performed. Paroxysmal atrial fibrillation with RVR Hx HTN, HLD.   CVP=8 P:  Continue phenylphrine, titrate for SBP of 100, MAP 60 Stop vasopressin Stop volume resuscitation Will hold off on addition of beta blocker or CCB for atrial fibrillation due to continued pressor needs Home blood pressure regimen on hold (clonidine, metoprolol) Aspirin, atorvastatin on hold  RENAL A:   Hypokalemia on admission.  Improved AKI, creatinine slightly worsened from 1.74>2.00 on 7/21 Hyperchloremic acidosis P:   Stop mIVF Start low dose sodium bicarbonate Follow urine output, BMP Avoid  nephrotoxins  GASTROINTESTINAL A:   Pneumoperitoneum - due to perforated descending colon.  S/p ex-lap with repair and creation of colostomy. GI prophylaxis. Nutrition. Hx peptic ulcers, GERD, colitis. P:   Post op care per CCS. SUP: Famotidine changed to pantoprazole per tube given thrombocytopenia NPO. Tolerating trickle tube feeding  HEMATOLOGIC A:   VTE Prophylaxis with SCD.  Platelets continue to downtrend, 54 on 7/21 Thrombocytopenia, likely due to sepsis but consider other causes P:  Stop heparin, obtain HIT antibodies Follow CBC  INFECTIOUS A:   Septic shock - due to peritonitis.  Hypothermic this morning with temp of 94.2.  A repeat sepsis assessment has been performed. WBC downtrending, 13.3 on 7/21 P:   Continue Abx as above (vanc / zosyn). Continue eraxis Follow cultures  as above. Follow PCT.  ENDOCRINE A:   Hx DM.  Sugars well controlled in mid to upper 100s overnight. P:   SSI per protocol Continue to hold preadmission novolog, levemir.  NEUROLOGIC A:   Sedation needs due to mechanical ventilation. Hx anxiety. P:   Sedation:  Fentanyl gtt.  Wean down to 50 mcg/hour due to patient's decreasing ventilator needs RASS goal: 0 to -1. Daily wake-up assessment Holding preadmission mirtazapine, Ativan   Family updated: Updated her daughter at bedside 7/20  Interdisciplinary Family Meeting v Palliative Care Meeting:  Due by: 08/18/17.  Amanda C. Shan Levans, MD PGY-2, [facility] 08/15/2017 9:06 AM

## 2017-08-15 NOTE — Progress Notes (Signed)
eLink Physician-Brief Progress Note Patient Name: Judith [name] DOB: May 22, 1940 MRN: [id number]   Date of Service  08/15/2017  HPI/Events of Note  Intermittent atrial fib noted tonight with HR around 130's.  BP has dropped a little when she goes into atrial fib.  She has been on levophed.  Her needs are fairly low.  Presently on 7 mcg/min  eICU Interventions  Transition from norepinephrine to phenylephrine drip to help with BP and hopefully have less effect on HR.  If afib persists will consider low dose IV beta blocker     Intervention Category Major Interventions: Arrhythmia - evaluation and management  Mauri Brooklyn, P 08/15/2017, 2:46 AM

## 2017-08-15 NOTE — Progress Notes (Signed)
Patient ID: Judith [name], female   DOB: [birth year], 77 y.o.   MRN: [id number]    5 Days Post-Op  Subjective: On some fentanyl, but doesn't seem to track or follow commands.  Went into A fib last night.  In and out of sinus tach and a fib currently.  When in a fib BP drops and then recovers some.  Still on 2 pressors.  She is doing some spontaneous breathing.  Objective: Vital signs in last 24 hours: Temp:  [97.7 F (36.5 C)-98.9 F (37.2 C)] 98.9 F (37.2 C) (07/21 0808) Pulse Rate:  [108-136] 108 (07/21 0900) Resp:  [14-25] 15 (07/21 0900) BP: (69-136)/(39-65) 69/44 (07/21 0900) SpO2:  [96 %-100 %] 99 % (07/21 0900) Arterial Line BP: (85-184)/(37-53) 104/43 (07/21 0900) FiO2 (%):  [30 %] 30 % (07/21 0808) Last BM Date: 08/14/17  Intake/Output from previous day: 07/20 0701 - 07/21 0700 In: 4282.5 [I.V.:2690; NG/GT:250; IV Piggyback:1342.5] Out: 755 [Urine:625; Stool:130] Intake/Output this shift: Total I/O In: 312.7 [I.V.:292.7; NG/GT:20] Out: -   PE: Gen: critically ill on vent Heart: tachy, but regular currently Lungs: CTAB on vent Abd: soft, midline wound now with lots of serous drainage, but fascia is still intact, but a bit dusky in places.  Colostomy with lots of feculent output.  Mucous fistula stable. Neuro: eyes widen to voice, but otherwise doesn't do much  Lab Results:  Recent Labs    08/14/17 0454 08/15/17 0610  WBC 14.3* 13.3*  HGB 10.2* 9.6*  HCT 32.3* 30.3*  PLT 90* 54*   BMET Recent Labs    08/14/17 0454 08/15/17 0610  NA 140 139  K 4.1 3.5  CL 115* 115*  CO2 13* 11*  GLUCOSE 123* 170*  BUN 37* 38*  CREATININE 1.74* 2.00*  CALCIUM 6.2* 6.4*   PT/INR No results for input(s): LABPROT, INR in the last 72 hours. CMP     Component Value Date/Time   NA 139 08/15/2017 0610   K 3.5 08/15/2017 0610   CL 115 (H) 08/15/2017 0610   CO2 11 (L) 08/15/2017 0610   GLUCOSE 170 (H) 08/15/2017 0610   BUN 38 (H) 08/15/2017 0610   CREATININE 2.00 (H)  08/15/2017 0610   CALCIUM 6.4 (LL) 08/15/2017 0610   PROT 6.0 (L) 08/01/2017 1946   ALBUMIN 2.5 (L) 08/12/2017 1946   AST 18 08/22/2017 1946   ALT 12 08/17/2017 1946   ALKPHOS 74 08/22/2017 1946   BILITOT 0.5 08/20/2017 1946   GFRNONAA 23 (L) 08/15/2017 0610   GFRAA 26 (L) 08/15/2017 0610   Lipase     Component Value Date/Time   LIPASE 70 (H) 08/14/2017 1954       Studies/Results: Dg Chest Port 1 View  Result Date: 08/14/2017 CLINICAL DATA:  Acute respiratory failure with hypoxia. EXAM: PORTABLE CHEST 1 VIEW COMPARISON:  08/13/2017 FINDINGS: Endotracheal tube has tip 3.3 cm above the carina. Nasogastric tube has side-port over the stomach in the left upper quadrant as tip is not visualized. Right IJ central venous catheter is unchanged with tip just above the cavoatrial junction. Lungs are adequately inflated with subtle left retrocardiac opacification likely atelectasis. Lungs are otherwise clear. Mild stable cardiomegaly. Remainder of the exam is unchanged. IMPRESSION: Mild left retrocardiac opacification likely atelectasis unchanged. Mild stable cardiomegaly. Tubes and lines as described. Electronically Signed   By: Marin Olp M.D.   On: 08/14/2017 09:08    Anti-infectives: Anti-infectives (From admission, onward)   Start     Dose/Rate Route Frequency  Ordered Stop   08/13/17 1100  anidulafungin (ERAXIS) 100 mg in sodium chloride 0.9 % 100 mL IVPB     100 mg 78 mL/hr over 100 Minutes Intravenous Every 24 hours 08/12/17 1048     08/12/17 1100  anidulafungin (ERAXIS) 200 mg in sodium chloride 0.9 % 200 mL IVPB     200 mg 78 mL/hr over 200 Minutes Intravenous  Once 08/12/17 1048 08/12/17 1648   08/11/17 0600  piperacillin-tazobactam (ZOSYN) IVPB 3.375 g     3.375 g 12.5 mL/hr over 240 Minutes Intravenous Every 8 hours 08/11/17 0025     08/11/17 0115  vancomycin (VANCOCIN) IVPB 750 mg/150 ml premix     750 mg 150 mL/hr over 60 Minutes Intravenous Every 24 hours 08/11/17 0110      08/11/17 0045  piperacillin-tazobactam (ZOSYN) IVPB 3.375 g     3.375 g 100 mL/hr over 30 Minutes Intravenous  Once 08/11/17 0033 08/11/17 0206   08/17/2017 2000  piperacillin-tazobactam (ZOSYN) IVPB 3.375 g  Status:  Discontinued     3.375 g 100 mL/hr over 30 Minutes Intravenous  Once 08/11/2017 1952 08/11/17 0025       Assessment/Plan Perforated proximal descending colon POD4,s/p extended left hemicolectomy with distal transverse colostomy and mucous fistula creation Dr Kae Heller. -TFs at 10cc/hr.  will d/w MD about advance to 20cc/hr with such good bowel function, will still need to be careful as she is still on 20 pressors. -BID midline dressing changes -WOC RN for ostomy care -cont zosyn and vanc, Eraxis added 7/18 -WBC down to 13K today.   Sepsis -secondary to #1 -patient still ill needing pressor support.   -cont abx therapy Thrombocytopenia -pepcid being changed to protonix per CCM -plts down to 54K today from 90K -heparin held, HIT antibodies being checked VDRF -may wean to extubate per CCM -doing some spontaneous breathing.  Trying to wean fentanyl to see if she can be extubated DM -SSI for now HTN -patient is currently hypotensive secondary to septic shock. Hold all antihypertensive meds for now. Acute Renal Failure -Creatinineup to 2 today.  UOP dropped some from 900-600cc, but making between 20-30cc/hr  FEN -NPO, IVFs, TFs at 10cc/hr VTE -heparin 5000, SCDs ID -Zosyn 7/16-->Vanc 7/16-->Eraxis 7/18 -->  Dispo - ICU, greatly appreciate CCM assistance with this patient and her complex care.     LOS: 5 days    Henreitta Cea , [facility] 08/15/2017, 9:49 AM Pager: [phone]

## 2017-08-16 ENCOUNTER — Inpatient Hospital Stay (HOSPITAL_COMMUNITY): Payer: Medicare Other

## 2017-08-16 LAB — POCT I-STAT 3, ART BLOOD GAS (G3+)
Acid-base deficit: 12 mmol/L — ABNORMAL HIGH (ref 0.0–2.0)
Bicarbonate: 10.9 mmol/L — ABNORMAL LOW (ref 20.0–28.0)
O2 SAT: 99 %
PO2 ART: 123 mmHg — AB (ref 83.0–108.0)
Patient temperature: 97.5
TCO2: 11 mmol/L — AB (ref 22–32)
pCO2 arterial: 17.1 mmHg — CL (ref 32.0–48.0)
pH, Arterial: 7.41 (ref 7.350–7.450)

## 2017-08-16 LAB — CBC
HEMATOCRIT: 29.1 % — AB (ref 36.0–46.0)
Hemoglobin: 9.6 g/dL — ABNORMAL LOW (ref 12.0–15.0)
MCH: 27 pg (ref 26.0–34.0)
MCHC: 33 g/dL (ref 30.0–36.0)
MCV: 81.7 fL (ref 78.0–100.0)
PLATELETS: 50 10*3/uL — AB (ref 150–400)
RBC: 3.56 MIL/uL — ABNORMAL LOW (ref 3.87–5.11)
RDW: 19.9 % — AB (ref 11.5–15.5)
WBC: 11.8 10*3/uL — ABNORMAL HIGH (ref 4.0–10.5)

## 2017-08-16 LAB — GLUCOSE, CAPILLARY
GLUCOSE-CAPILLARY: 93 mg/dL (ref 70–99)
GLUCOSE-CAPILLARY: 144 mg/dL — AB (ref 70–99)
Glucose-Capillary: 117 mg/dL — ABNORMAL HIGH (ref 70–99)
Glucose-Capillary: 139 mg/dL — ABNORMAL HIGH (ref 70–99)
Glucose-Capillary: 151 mg/dL — ABNORMAL HIGH (ref 70–99)
Glucose-Capillary: 165 mg/dL — ABNORMAL HIGH (ref 70–99)

## 2017-08-16 LAB — BASIC METABOLIC PANEL
Anion gap: 12 (ref 5–15)
BUN: 40 mg/dL — AB (ref 8–23)
CALCIUM: 6.6 mg/dL — AB (ref 8.9–10.3)
CHLORIDE: 114 mmol/L — AB (ref 98–111)
CO2: 15 mmol/L — AB (ref 22–32)
CREATININE: 1.96 mg/dL — AB (ref 0.44–1.00)
GFR calc Af Amer: 27 mL/min — ABNORMAL LOW (ref >60)
GFR calc non Af Amer: 23 mL/min — ABNORMAL LOW (ref >60)
GLUCOSE: 184 mg/dL — AB (ref 70–99)
Potassium: 2.9 mmol/L — ABNORMAL LOW (ref 3.5–5.1)
Sodium: 141 mmol/L (ref 135–145)

## 2017-08-16 LAB — CULTURE, BLOOD (ROUTINE X 2)
CULTURE: NO GROWTH
CULTURE: NO GROWTH
Special Requests: ADEQUATE

## 2017-08-16 LAB — MAGNESIUM: Magnesium: 1.8 mg/dL (ref 1.7–2.4)

## 2017-08-16 MED ORDER — PHENYLEPHRINE HCL-NACL 40-0.9 MG/250ML-% IV SOLN
0.0000 ug/min | INTRAVENOUS | Status: DC
  Administered 2017-08-16 (×2): 20 ug/min via INTRAVENOUS
  Administered 2017-08-17: 30 ug/min via INTRAVENOUS
  Administered 2017-08-19: 3 ug/min via INTRAVENOUS
  Filled 2017-08-16 (×4): qty 250

## 2017-08-16 MED ORDER — METOPROLOL TARTRATE 5 MG/5ML IV SOLN
5.0000 mg | Freq: Once | INTRAVENOUS | Status: AC
  Administered 2017-08-16: 5 mg via INTRAVENOUS
  Filled 2017-08-16: qty 5

## 2017-08-16 MED ORDER — SODIUM CHLORIDE 0.9 % IV SOLN
1.0000 g | Freq: Once | INTRAVENOUS | Status: AC
  Administered 2017-08-16: 1 g via INTRAVENOUS
  Filled 2017-08-16: qty 10

## 2017-08-16 MED ORDER — POTASSIUM CHLORIDE 10 MEQ/50ML IV SOLN
10.0000 meq | INTRAVENOUS | Status: AC
  Administered 2017-08-16 (×4): 10 meq via INTRAVENOUS
  Filled 2017-08-16 (×4): qty 50

## 2017-08-16 NOTE — Progress Notes (Signed)
PULMONARY / CRITICAL CARE MEDICINE   Name: Judith [name] MRN: [id number] DOB: 12-Sep-1940    ADMISSION DATE:  08/09/2017 CONSULTATION DATE:  08/11/17  REFERRING MD:  Kae Heller  CHIEF COMPLAINT:  Vent Management  HISTORY OF PRESENT ILLNESS:  Pt is encephelopathic; therefore, this HPI is obtained from chart review. Judith [name] is a 77 y.o. female with PMH as outlined below and who resides at nursing home.  She was brought to [facility] ED 7/16 after staff at nursing home found her with AMS.  She had been asymptomatic prior to this. In ED, she had CXR and CT A / P which showed large amount of pneumoperitoneum.  She also had hypotension that was slow to respond to IVF's. She was taken to OR by surgery where she had ex-lap which revealed perforated proximal descending colon.  She has an extended left hemicolectomy including mobilization of splenic flexure, creation of colostomy and mucous fistula.  Post OR, she returned to the ICU where she remained ventilated and on low dose pressors.  PCCM was asked to assist with vent management.  SUBJECTIVE:   On phenylephrine at 30 mcg/min Fentanyl drip at 50 mcg/hr CVP=10  VITAL SIGNS: BP (!) 125/54   Pulse 95   Temp 98.2 F (36.8 C) (Oral)   Resp 19   Ht 5' (1.524 m)   Wt 197 lb 1.5 oz (89.4 kg)   SpO2 99%   BMI 38.49 kg/m   HEMODYNAMICS:    VENTILATOR SETTINGS: Vent Mode: PSV;CPAP FiO2 (%):  [30 %] 30 % Set Rate:  [15 bmp] 15 bmp Vt Set:  [360 mL] 360 mL PEEP:  [5 cmH20] 5 cmH20 Pressure Support:  [5 cmH20] 5 cmH20  INTAKE / OUTPUT: I/O last 3 completed shifts: In: 5273.2 [I.V.:3963.3; NG/GT:716.4; IV Piggyback:593.5] Out: 2760 [Urine:1915; Emesis/NG output:350; Stool:495]   PHYSICAL EXAMINATION: General: Elderly appearing female, resting in bed, in NAD.  No response to stimulation, eyes open. Neuro: Sedated, unresponsive HEENT: [place]/AT. ETT in place. Cardiovascular: regular rhythm, slightly tachycardic, no M/R/G.  Lungs: Respirations even  and unlabored.  CTAB Abdomen: Midline dressings C/D/I.  Colostomy bag in place with loose brown stool.  BS hypoactive. Abdomen soft.  Musculoskeletal: R BKA.  Upper extremities edematous. Skin: Intact, warm, no rashes.   LABS:  BMET Recent Labs  Lab 08/14/17 0454 08/15/17 0610 08/16/17 0257  NA 140 139 141  K 4.1 3.5 2.9*  CL 115* 115* 114*  CO2 13* 11* 15*  BUN 37* 38* 40*  CREATININE 1.74* 2.00* 1.96*  GLUCOSE 123* 170* 184*    Electrolytes Recent Labs  Lab 08/13/17 1701 08/14/17 0454 08/14/17 1650 08/15/17 0610 08/16/17 0257  CALCIUM  --  6.2*  --  6.4* 6.6*  MG 1.3* 1.3* 1.7  --  1.8  PHOS 5.2* 5.3* 5.1*  --   --     CBC Recent Labs  Lab 08/14/17 0454 08/15/17 0610 08/16/17 0257  WBC 14.3* 13.3* 11.8*  HGB 10.2* 9.6* 9.6*  HCT 32.3* 30.3* 29.1*  PLT 90* 54* 50*    Coag's Recent Labs  Lab 08/25/2017 1946  APTT 22*  INR 1.33    Sepsis Markers Recent Labs  Lab 08/11/17 0055  08/12/17 0426 08/12/17 1615 08/13/17 0258 08/14/17 1454  LATICACIDVEN  --    < > 6.3* 6.0* 3.6* 2.0*  PROCALCITON 45.55  --  >150.00  --  >150.00  --    < > = values in this interval not displayed.    ABG  Recent Labs  Lab 08/12/17 0600 08/12/17 1440 08/13/17 0334  PHART 7.219* 7.251* 7.333*  PCO2ART 30.2* 26.4* 27.3*  PO2ART 101.0 76.0* 84.0    Liver Enzymes Recent Labs  Lab 08/18/2017 1946  AST 18  ALT 12  ALKPHOS 74  BILITOT 0.5  ALBUMIN 2.5*    Cardiac Enzymes No results for input(s): TROPONINI, PROBNP in the last 168 hours.  Glucose Recent Labs  Lab 08/15/17 1207 08/15/17 1733 08/15/17 2014 08/15/17 2347 08/16/17 0432 08/16/17 0716  GLUCAP 127* 123* 118* 114* 151* 139*    Imaging Dg Chest Port 1 View  Result Date: 08/16/2017 CLINICAL DATA:  Acute respiratory failure EXAM: PORTABLE CHEST 1 VIEW COMPARISON:  08/15/2017 FINDINGS: Endotracheal tube, nasogastric catheter and right jugular central line are again identified and stable in  appearance. Lungs are well aerated bilaterally. Some improved aeration in the left base is noted when compare with the previous day. No new focal infiltrate or bony abnormality is noted. IMPRESSION: Improving aeration in the left base. Electronically Signed   By: Inez Catalina M.D.   On: 08/16/2017 06:58     STUDIES:  CT head 7/16 > chronic small vessel ischemic disease. CT A / P 7/16 > large volume pneumoperitoneum, probable sigmoid perf, b/l lower lobe consolidation.  CULTURES: Blood 7/16 > NG4D Tracheal aspirate 7/17 > NG Final  ANTIBIOTICS: Vanc 7/17 >  Zosyn 7/17 >  Eraxis 7/18 >>   SIGNIFICANT EVENTS: 7/16 > admit, to OR for ex-lap. 7/17 > remains intubated after hemicolectomy and colostomy placement 7/18 > worsening labs overnight, poor UOP, HCO3 infusion started 7/19 > improved ABG, improved lactic acid 7/20 > atrial fibrillation with rate into 130s, norepinephrine changed to phenylephrine  7/21 > fentanyl weaned down, vasopressin stopped, intermittently in atrial fibrillation. Potassium replaced overnight, intermittent a fib with drop in pressure during these episodes, responded well to Lopressor 5 mg  LINES/TUBES: ETT 7/16 >  R IJ CVL 7/16 >  L radial art line 7/16 >   DISCUSSION: 77 y.o. female from nursing home, admitted 7/16 with pneumoperitoneum.  Taken for ex-lap where she was found to have perforated proximal descending colon which was repaired and colostomy performed.  ASSESSMENT / PLAN:  PULMONARY A: Respiratory insufficiency - remains intubated post op. Possible HCAP. Hx asthma. CXR on 7/22 with improved aeration of L base P:   Continue pressure support VAP prevention orders Bronchodilators as needed Follow chest x-ray Antibiotics as below  CARDIOVASCULAR A:  Septic shock - due to peritonitis +/- HCAP.  A repeat sepsis assessment has been performed. Paroxysmal atrial fibrillation with drop in BP Hx HTN, HLD.   CVP=10 P:  Continue phenylphrine,  titrate for SBP of 100, MAP 60 Add beta blocker or CCB for atrial fibrillation to prevent further drops in BP and allow further weaning of phenylephrine Home blood pressure regimen on hold (clonidine, metoprolol) Aspirin, atorvastatin on hold PRN Lopressor IV 5 mg   RENAL A:   Hypokalemia overnight, K replaced AKI, creatinine stable at 1.96 on 7/22 from 2.00 on 7/21 Hyperchloremic acidosis P:   Low dose sodium bicarbonate Follow urine output, BMP Avoid nephrotoxins  GASTROINTESTINAL A:   Pneumoperitoneum - due to perforated descending colon.  S/p ex-lap with repair and creation of colostomy. GI prophylaxis. Nutrition. Hx peptic ulcers, GERD, colitis. P:   Post op care per CCS. SUP: Famotidine changed to pantoprazole per tube given thrombocytopenia NPO. Tolerating trickle tube feeding  HEMATOLOGIC A:   VTE Prophylaxis with SCD.  Platelets  continue to downtrend, 54 on 7/21 Thrombocytopenia, worsening, likely due to sepsis but also considering HIT P:  Heparin stopped, follow up HIT antibodies Follow CBC  INFECTIOUS A:   Septic shock - due to peritonitis.  A repeat sepsis assessment has been performed. WBC downtrending, 11.2 on 7/22 P:   Continue Abx as above (vanc / zosyn). Continue eraxis Follow cultures as above. Follow PCT.  ENDOCRINE A:   Hx DM.  Sugars well controlled in mid to upper 100s overnight. P:   SSI per protocol Continue to hold preadmission novolog, levemir.  NEUROLOGIC A:   Sedation needs due to mechanical ventilation. Hx anxiety. P:   Sedation:  Stop fentanyl RASS goal: 0 to -1. Daily wake-up assessment Holding preadmission mirtazapine, Ativan Will need goals of care discussion with family if no increase in alertness after stopping Fentanyl   Family updated: Update family daily  Interdisciplinary Family Meeting v Palliative Care Meeting:  Due by: 08/18/17.  Amanda C. Shan Levans, MD PGY-2, [facility] 08/16/2017 8:33 AM

## 2017-08-16 NOTE — Progress Notes (Signed)
Nutrition Follow-up  DOCUMENTATION CODES:   Obesity unspecified  INTERVENTION:    Continue Vital High Protein at 50 ml/h (1200 ml per day) to provide 1200 kcal, 105 gm protein, 1003 ml free water daily.  NUTRITION DIAGNOSIS:   Inadequate oral intake related to inability to eat, altered GI function as evidenced by NPO status.  Ongoing  GOAL:   Provide needs based on ASPEN/SCCM guidelines  Met  MONITOR:   Vent status, Labs, I & O's, Skin  ASSESSMENT:   77 yo female with PMH of DM, HLD, asthma, GERD, Charcot's joint, R BKA, HLD, and HTN who was admitted on 7/16 with septic shock and pneumoperitoneum; found to have perforated proximal descending colon. S/P extended L hemicolectomy with distal transverse colostomy and mucous fistula creation on 7/17.   Discussed patient with RN today. Patient is receiving Vital High Protein at 50 ml/h, tolerating well at goal rate to meet nutrition needs. Patient is currently intubated on ventilator support MV: 11.9 L/min Temp (24hrs), Avg:98.2 F (36.8 C), Min:98 F (36.7 C), Max:98.5 F (36.9 C)   Labs reviewed. Potassium 2.9 (L), BUN 40 (H), Creatinine 1.96 (H) CBG's: 151-139-93 Medications reviewed and include Novolog, Neosynephrine.   Diet Order:   Diet Order           Diet NPO time specified  Diet effective now          EDUCATION NEEDS:   No education needs have been identified at this time  Skin:  Skin Assessment: Skin Integrity Issues: Skin Integrity Issues:: Diabetic Ulcer, Incisions Diabetic Ulcer: L foot Incisions: abdomen  Last BM:  7/21 (colostomy 450+ ml 7/21)   Height:   Ht Readings from Last 1 Encounters:  08/11/17 5' (1.524 m)    Weight:   Wt Readings from Last 1 Encounters:  08/16/17 197 lb 1.5 oz (89.4 kg)   08/13/17 190 lb 0.6 oz (86.2 kg)    Ideal Body Weight:  42.5 kg  BMI:  39.7 (using 7/19 weight adjusted for BKA)  Estimated Nutritional Needs:   Kcal:  1000-1200  Protein:  >/=  85 gm  Fluid:  1.5 L     , RD, LDN, CNSC Pager [phone] After Hours Pager [phone]  

## 2017-08-16 NOTE — Progress Notes (Signed)
eLink Physician-Brief Progress Note Patient Name: Judith [name] DOB: 1940-10-09 MRN: [id number]   Date of Service  08/16/2017  HPI/Events of Note  Low potassium  eICU Interventions  replaced     Intervention Category Minor Interventions: Electrolytes abnormality - evaluation and management  Mauri Brooklyn, P 08/16/2017, 5:26 AM

## 2017-08-16 NOTE — Progress Notes (Signed)
Patient ID: Judith [name], female   DOB: Mar 17, 1940, 77 y.o.   MRN: [id number]    6 Days Post-Op  Subjective: Decreased fentanyl to 42mcg but still won't follow commands or really arouse to her name.  She will barely raise her eyeslids a little more than they already are.  Down to 1 pressor.  Seems to be tolerating her TFs well.    Objective: Vital signs in last 24 hours: Temp:  [98 F (36.7 C)-98.5 F (36.9 C)] 98.2 F (36.8 C) (07/22 0718) Pulse Rate:  [90-135] 101 (07/22 0830) Resp:  [15-26] 26 (07/22 0830) BP: (80-143)/(41-76) 125/54 (07/22 0816) SpO2:  [93 %-100 %] 99 % (07/22 0830) Arterial Line BP: (90-157)/(40-69) 130/55 (07/22 0830) FiO2 (%):  [30 %] 30 % (07/22 0816) Weight:  [89.4 kg (197 lb 1.5 oz)] 89.4 kg (197 lb 1.5 oz) (07/22 0500) Last BM Date: 08/15/17  Intake/Output from previous day: 07/21 0701 - 07/22 0700 In: 3419.5 [I.V.:2429.4; NG/GT:606.4; IV Piggyback:383.7] Out: 2625 [Urine:1780; Emesis/NG output:350; Stool:495] Intake/Output this shift: Total I/O In: 201.1 [I.V.:113.6; IV Piggyback:87.5] Out: -   PE: Gen: critically ill appearing female Heart: sinus currently Lungs: on vent, relatively clear Abd: soft, midline wound still with serous drainage and dressing is soaking wet.  Otherwise clean.  Colostomy with good output.  Mucous fistula stable.    Lab Results:  Recent Labs    08/15/17 0610 08/16/17 0257  WBC 13.3* 11.8*  HGB 9.6* 9.6*  HCT 30.3* 29.1*  PLT 54* 50*   BMET Recent Labs    08/15/17 0610 08/16/17 0257  NA 139 141  K 3.5 2.9*  CL 115* 114*  CO2 11* 15*  GLUCOSE 170* 184*  BUN 38* 40*  CREATININE 2.00* 1.96*  CALCIUM 6.4* 6.6*   PT/INR No results for input(s): LABPROT, INR in the last 72 hours. CMP     Component Value Date/Time   NA 141 08/16/2017 0257   K 2.9 (L) 08/16/2017 0257   CL 114 (H) 08/16/2017 0257   CO2 15 (L) 08/16/2017 0257   GLUCOSE 184 (H) 08/16/2017 0257   BUN 40 (H) 08/16/2017 0257   CREATININE  1.96 (H) 08/16/2017 0257   CALCIUM 6.6 (L) 08/16/2017 0257   PROT 6.0 (L) 07/31/2017 1946   ALBUMIN 2.5 (L) 08/08/2017 1946   AST 18 08/12/2017 1946   ALT 12 08/14/2017 1946   ALKPHOS 74 08/14/2017 1946   BILITOT 0.5 08/19/2017 1946   GFRNONAA 23 (L) 08/16/2017 0257   GFRAA 27 (L) 08/16/2017 0257   Lipase     Component Value Date/Time   LIPASE 70 (H) 08/08/2017 1954       Studies/Results: Dg Chest Port 1 View  Result Date: 08/16/2017 CLINICAL DATA:  Acute respiratory failure EXAM: PORTABLE CHEST 1 VIEW COMPARISON:  08/15/2017 FINDINGS: Endotracheal tube, nasogastric catheter and right jugular central line are again identified and stable in appearance. Lungs are well aerated bilaterally. Some improved aeration in the left base is noted when compare with the previous day. No new focal infiltrate or bony abnormality is noted. IMPRESSION: Improving aeration in the left base. Electronically Signed   By: Inez Catalina M.D.   On: 08/16/2017 06:58   Dg Chest Port 1 View  Result Date: 08/15/2017 CLINICAL DATA:  Acute respiratory failure with hypoxia EXAM: PORTABLE CHEST 1 VIEW COMPARISON:  08/14/2017 FINDINGS: Endotracheal tube 3 cm from carina. NG tube in stomach. Low lung volumes. There is LEFT lower lobe effusion with opacity representing atelectasis or  infiltrate. Mild increase in the LEFT upper lobe density. Upper lungs are clear. No pneumothorax IMPRESSION: 1. Stable support apparatus. 2. Increased atelectasis versus infiltrate in the LEFT lower lobe with small effusion. Electronically Signed   By: Suzy Bouchard M.D.   On: 08/15/2017 10:28    Anti-infectives: Anti-infectives (From admission, onward)   Start     Dose/Rate Route Frequency Ordered Stop   08/13/17 1100  anidulafungin (ERAXIS) 100 mg in sodium chloride 0.9 % 100 mL IVPB     100 mg 78 mL/hr over 100 Minutes Intravenous Every 24 hours 08/12/17 1048     08/12/17 1100  anidulafungin (ERAXIS) 200 mg in sodium chloride 0.9 %  200 mL IVPB     200 mg 78 mL/hr over 200 Minutes Intravenous  Once 08/12/17 1048 08/12/17 1648   08/11/17 0600  piperacillin-tazobactam (ZOSYN) IVPB 3.375 g     3.375 g 12.5 mL/hr over 240 Minutes Intravenous Every 8 hours 08/11/17 0025     08/11/17 0115  vancomycin (VANCOCIN) IVPB 750 mg/150 ml premix     750 mg 150 mL/hr over 60 Minutes Intravenous Every 24 hours 08/11/17 0110     08/11/17 0045  piperacillin-tazobactam (ZOSYN) IVPB 3.375 g     3.375 g 100 mL/hr over 30 Minutes Intravenous  Once 08/11/17 0033 08/11/17 0206   08/14/2017 2000  piperacillin-tazobactam (ZOSYN) IVPB 3.375 g  Status:  Discontinued     3.375 g 100 mL/hr over 30 Minutes Intravenous  Once 08/02/2017 1952 08/11/17 0025       Assessment/Plan Perforated proximal descending colon POD5,s/p extended left hemicolectomy with distal transverse colostomy and mucous fistula creation Dr Kae Heller. -TFs at goal rate and tolerating well. -BID midline dressing changes and prn for saturation from serous drainage -WOC RN for ostomy care -cont zosyn and vanc, Eraxis added 7/18 -WBCdown to 11K today.   Sepsis -secondary to #1 -patient still ill needing pressor support.  -cont abx therapy Thrombocytopenia -pepcid being changed to protonix per CCM -plts stabilized at 50 today after heparin DC yesterday -heparin held, HIT antibodies being checked, still pending VDRF -may wean to extubate per CCM -doing some spontaneous breathing.  Trying to wean fentanyl to see if she can be extubated -she is still relatively unresponsive and does not follow commands or arouse much DM -SSI for now HTN -patient is currently hypotensive secondary to septic shock. Hold all antihypertensive meds for now. Acute Renal Failure -Creatinine1.96.  UOP picked up with almost 1800cc FEN -NPO, IVFs, TFs at 50cc/hr, hypokalemia, already replaced by CCM this am, hypocalcemia, replaced with 1g today. VTE - SCDs ID -Zosyn 7/16-->Vanc  7/16-->Eraxis 7/18 -->  Dispo - ICU, greatly appreciate CCM assistance with this patient and her complex care.     LOS: 6 days    Henreitta Cea , [facility] 08/16/2017, 9:20 AM Pager: [phone]

## 2017-08-16 NOTE — Progress Notes (Signed)
Pharmacy Antibiotic Note  Judith [name] is a 77 y.o. female admitted on 07/30/2017 with perforated colon.  Pharmacy has been consulted for Vancomycin and Zosyn dosing for HCAP/peritonitis.  Patient is also on Eraxis.  Renal function is improving.  Afebrile, WBC down 11.8, LA down 2, PCT > 150.   Plan: Vanc 750mg  IV Q24H Zosyn EID 3.375gm IV Q8H Eraxis 100mg  IV Q24H per MD Monitor renal fxn, clinical progress, abx LOT vs repeating vanc trough   Height: 5' (152.4 cm) Weight: 197 lb 1.5 oz (89.4 kg) IBW/kg (Calculated) : 45.5  Temp (24hrs), Avg:98.4 F (36.9 C), Min:98 F (36.7 C), Max:98.9 F (37.2 C)  Recent Labs  Lab 08/11/17 0359  08/12/17 0426 08/12/17 1615 08/13/17 0004 08/13/17 0258 08/14/17 0454 08/14/17 1454 08/15/17 0610 08/16/17 0257  WBC  --    < > 14.7*  --   --  18.4* 14.3*  --  13.3* 11.8*  CREATININE  --    < > 1.70* 1.71*  --  1.73* 1.74*  --  2.00* 1.96*  LATICACIDVEN 3.5*  --  6.3* 6.0*  --  3.6*  --  2.0*  --   --   VANCOTROUGH  --   --   --   --  14*  --   --   --   --   --    < > = values in this interval not displayed.    Estimated Creatinine Clearance: 23.6 mL/min (A) (by C-G formula based on SCr of 1.96 mg/dL (H)).    Allergies  Allergen Reactions  . Heparin      Zosyn 7/17 >> Vanc 7/17 >> Eraxis 7/18 >>  7/19 VT = 14 on 750mg  q24 >> no change (SCr 1.73)  7/17 TA - negative 7/17 BCx - NGTD 7/17 MRSA PCR - negative   Tedd Cottrill D. Mina Marble, PharmD, BCPS, [place] 08/16/2017, 7:44 AM

## 2017-08-16 NOTE — Consult Note (Addendum)
[facility] Nurse ostomy follow up:  Surgical team is following for assessment and plan of care to abd wound. Stoma type/location: LUQ transverse colostomy and LLQ MF Stomal assessment/size:  Transverse Colostomy measures 1 and 1/2 inches, round, red,  edematous, moist, os at center, above skin level. 2-piece pouching system placed today; 2 and 1/4 inch .Mod amt tan unformed stool in the pouch. Mucus Fistula measures 1 and 1/8 inches when gentle traction is applied, red, oval and flush with skin level. Deep crease along distal border of stoma. Peristomal skin clear. Mod amt bloody drainage in the pouch when changed, but no active bleeding noted. Skin barrier ring placed around stoma to fill creases and one piece convex pouch applied. Education provided: Pt is intubated and obtunded and there are no family members in the room. Supplies at bedside for staff nurse use PRN if leakage occurs.  Enrolled patient in [place] Start Discharge program: No  Requested to assess buttocks.  Pt has a large amt liquid brown stool and is frequently incontinent.  There is also some tan blood-tinged drainage leaking from her vagina.  Skin to posterior labia and to 4 cm surrounding rectum is red and macerated and peeling with partial thickness skin loss; appearance is consistent with moisture associated skin damage.  [place] nurse applied a rectal pouch to attempt to contain stool. [place] team will continue to follow for ostomy pouch assessment. Julien Girt MSN, RN, [place], [place], [place]

## 2017-08-17 DIAGNOSIS — J9600 Acute respiratory failure, unspecified whether with hypoxia or hypercapnia: Secondary | ICD-10-CM

## 2017-08-17 DIAGNOSIS — Z515 Encounter for palliative care: Secondary | ICD-10-CM

## 2017-08-17 LAB — CBC
HCT: 29.2 % — ABNORMAL LOW (ref 36.0–46.0)
Hemoglobin: 9.7 g/dL — ABNORMAL LOW (ref 12.0–15.0)
MCH: 26.5 pg (ref 26.0–34.0)
MCHC: 33.2 g/dL (ref 30.0–36.0)
MCV: 79.8 fL (ref 78.0–100.0)
PLATELETS: 45 10*3/uL — AB (ref 150–400)
RBC: 3.66 MIL/uL — ABNORMAL LOW (ref 3.87–5.11)
RDW: 19.9 % — ABNORMAL HIGH (ref 11.5–15.5)
WBC: 10.9 10*3/uL — ABNORMAL HIGH (ref 4.0–10.5)

## 2017-08-17 LAB — BASIC METABOLIC PANEL
Anion gap: 10 (ref 5–15)
Anion gap: 10 (ref 5–15)
BUN: 43 mg/dL — AB (ref 8–23)
BUN: 47 mg/dL — AB (ref 8–23)
CALCIUM: 6.8 mg/dL — AB (ref 8.9–10.3)
CO2: 13 mmol/L — ABNORMAL LOW (ref 22–32)
CO2: 13 mmol/L — AB (ref 22–32)
CREATININE: 1.8 mg/dL — AB (ref 0.44–1.00)
CREATININE: 1.77 mg/dL — AB (ref 0.44–1.00)
Calcium: 6.7 mg/dL — ABNORMAL LOW (ref 8.9–10.3)
Chloride: 116 mmol/L — ABNORMAL HIGH (ref 98–111)
Chloride: 117 mmol/L — ABNORMAL HIGH (ref 98–111)
GFR calc non Af Amer: 26 mL/min — ABNORMAL LOW (ref >60)
GFR, EST AFRICAN AMERICAN: 30 mL/min — AB (ref >60)
GFR, EST AFRICAN AMERICAN: 31 mL/min — AB (ref >60)
GFR, EST NON AFRICAN AMERICAN: 26 mL/min — AB (ref >60)
Glucose, Bld: 217 mg/dL — ABNORMAL HIGH (ref 70–99)
Glucose, Bld: 138 mg/dL — ABNORMAL HIGH (ref 70–99)
Potassium: 2.7 mmol/L — CL (ref 3.5–5.1)
Potassium: 3.3 mmol/L — ABNORMAL LOW (ref 3.5–5.1)
SODIUM: 139 mmol/L (ref 135–145)
Sodium: 140 mmol/L (ref 135–145)

## 2017-08-17 LAB — GLUCOSE, CAPILLARY
GLUCOSE-CAPILLARY: 121 mg/dL — AB (ref 70–99)
GLUCOSE-CAPILLARY: 138 mg/dL — AB (ref 70–99)
GLUCOSE-CAPILLARY: 186 mg/dL — AB (ref 70–99)
Glucose-Capillary: 161 mg/dL — ABNORMAL HIGH (ref 70–99)
Glucose-Capillary: 145 mg/dL — ABNORMAL HIGH (ref 70–99)
Glucose-Capillary: 113 mg/dL — ABNORMAL HIGH (ref 70–99)

## 2017-08-17 LAB — HEPARIN INDUCED PLATELET AB (HIT ANTIBODY): HEPARIN INDUCED PLT AB: 0.124 {OD_unit} (ref 0.000–0.400)

## 2017-08-17 MED ORDER — SODIUM CHLORIDE 0.9 % IV SOLN
1.0000 g | Freq: Once | INTRAVENOUS | Status: AC
  Administered 2017-08-17: 1 g via INTRAVENOUS
  Filled 2017-08-17: qty 10

## 2017-08-17 MED ORDER — POTASSIUM CHLORIDE 10 MEQ/50ML IV SOLN
10.0000 meq | INTRAVENOUS | Status: AC
  Administered 2017-08-17 (×4): 10 meq via INTRAVENOUS
  Filled 2017-08-17 (×4): qty 50

## 2017-08-17 MED ORDER — POTASSIUM CHLORIDE 10 MEQ/100ML IV SOLN
10.0000 meq | INTRAVENOUS | Status: AC
  Administered 2017-08-17 (×4): 10 meq via INTRAVENOUS
  Filled 2017-08-17 (×4): qty 100

## 2017-08-17 NOTE — Progress Notes (Signed)
Pt placed back on FS at this time tolerating it well no distress or complications noted.

## 2017-08-17 NOTE — Progress Notes (Signed)
PULMONARY / CRITICAL CARE MEDICINE   Name: Judith [name] MRN: [id number] DOB: [birth year]    ADMISSION DATE:  08/03/2017 CONSULTATION DATE:  08/11/17  REFERRING MD:  Kae Heller  CHIEF COMPLAINT:  Vent Management  HISTORY OF PRESENT ILLNESS:  Pt is encephelopathic; therefore, this HPI is obtained from chart review. Judith [name] is a 77 y.o. female with PMH as outlined below and who resides at nursing home.  She was brought to [facility] ED 7/16 after staff at nursing home found her with AMS.  She had been asymptomatic prior to this. In ED, she had CXR and CT A / P which showed large amount of pneumoperitoneum.  She also had hypotension that was slow to respond to IVF's. She was taken to OR by surgery where she had ex-lap which revealed perforated proximal descending colon.  She has an extended left hemicolectomy including mobilization of splenic flexure, creation of colostomy and mucous fistula.  Post OR, she returned to the ICU where she remained ventilated and on low dose pressors.  PCCM was asked to assist with vent management.  SUBJECTIVE:   Patient sedated, on vent On phenylephrine at 25 mcg/min Fentanyl drip at 25 mcg/hr  VITAL SIGNS: BP (!) 76/45   Pulse 99   Temp 97.8 F (36.6 C) (Axillary)   Resp (!) 21   Ht 5' (1.524 m)   Wt 201 lb 8 oz (91.4 kg)   SpO2 100%   BMI 39.35 kg/m   HEMODYNAMICS: CVP:  [10 mmHg] 10 mmHg  VENTILATOR SETTINGS: Vent Mode: PCV FiO2 (%):  [30 %] 30 % Set Rate:  [15 bmp] 15 bmp Vt Set:  [360 mL] 360 mL PEEP:  [5 cmH20] 5 cmH20 Pressure Support:  [5 cmH20] 5 cmH20 Plateau Pressure:  [15 cmH20-17 cmH20] 15 cmH20  INTAKE / OUTPUT: I/O last 3 completed shifts: In: 4344.5 [I.V.:1838.5; NG/GT:1450; IV ZYYQMGNOI:3704] Out: 8889 [Urine:3240; Emesis/NG output:150; VQXIH:0388]   PHYSICAL EXAMINATION: General: Elderly appearing female, resting in bed, in NAD.  No response to stimulation, eyes open. Neuro: Sedated, unresponsive HEENT: [place]/AT. ETT in  place. Cardiovascular: regular rhythm, slightly tachycardic, no M/R/G.  Lungs: Respirations even and unlabored.  CTAB Abdomen: Midline dressings C/D/I.  Colostomy bag in place with loose brown stool and some blood.  BS hypoactive. Abdomen soft.  Musculoskeletal: R BKA.  Upper extremities edematous. Skin: Intact, warm, no rashes.   LABS:  BMET Recent Labs  Lab 08/15/17 0610 08/16/17 0257 08/17/17 0423  NA 139 141 139  K 3.5 2.9* 2.7*  CL 115* 114* 116*  CO2 11* 15* 13*  BUN 38* 40* 43*  CREATININE 2.00* 1.96* 1.80*  GLUCOSE 170* 184* 217*    Electrolytes Recent Labs  Lab 08/13/17 1701 08/14/17 0454 08/14/17 1650 08/15/17 0610 08/16/17 0257 08/17/17 0423  CALCIUM  --  6.2*  --  6.4* 6.6* 6.7*  MG 1.3* 1.3* 1.7  --  1.8  --   PHOS 5.2* 5.3* 5.1*  --   --   --     CBC Recent Labs  Lab 08/15/17 0610 08/16/17 0257 08/17/17 0423  WBC 13.3* 11.8* 10.9*  HGB 9.6* 9.6* 9.7*  HCT 30.3* 29.1* 29.2*  PLT 54* 50* 45*    Coag's Recent Labs  Lab 07/28/2017 1946  APTT 22*  INR 1.33    Sepsis Markers Recent Labs  Lab 08/11/17 0055  08/12/17 0426 08/12/17 1615 08/13/17 0258 08/14/17 1454  LATICACIDVEN  --    < > 6.3* 6.0* 3.6* 2.0*  PROCALCITON 45.55  --  >150.00  --  >150.00  --    < > = values in this interval not displayed.    ABG Recent Labs  Lab 08/12/17 1440 08/13/17 0334 08/16/17 2015  PHART 7.251* 7.333* 7.410  PCO2ART 26.4* 27.3* 17.1*  PO2ART 76.0* 84.0 123.0*    Liver Enzymes Recent Labs  Lab 08/18/2017 1946  AST 18  ALT 12  ALKPHOS 74  BILITOT 0.5  ALBUMIN 2.5*    Cardiac Enzymes No results for input(s): TROPONINI, PROBNP in the last 168 hours.  Glucose Recent Labs  Lab 08/16/17 0716 08/16/17 1130 08/16/17 1546 08/16/17 1953 08/16/17 2337 08/17/17 0407  GLUCAP 139* 93 144* 117* 165* 186*    Imaging No results found.   STUDIES:  CT head 7/16 > chronic small vessel ischemic disease. CT A / P 7/16 > large volume  pneumoperitoneum, probable sigmoid perf, b/l lower lobe consolidation.  CULTURES: Blood 7/16 > NG4D Tracheal aspirate 7/17 > NG Final  ANTIBIOTICS: Vanc 7/17 >  Zosyn 7/17 >  Eraxis 7/18 >>   SIGNIFICANT EVENTS: 7/16 > admit, to OR for ex-lap. 7/17 > remains intubated after hemicolectomy and colostomy placement 7/18 > worsening labs overnight, poor UOP, HCO3 infusion started 7/19 > improved ABG, improved lactic acid 7/20 > atrial fibrillation with rate into 130s, norepinephrine changed to phenylephrine  7/21 > fentanyl weaned down, vasopressin stopped, intermittently in atrial fibrillation. Potassium replaced overnight, intermittent a fib with drop in pressure during these episodes, responded well to Lopressor 5 mg 7/22 > decreased fentanyl from 50 mcg to 25 mcg with no improvement in neurological status, palliative care consult placed  LINES/TUBES: ETT 7/16 >  R IJ CVL 7/16 >  L radial art line 7/16 >   DISCUSSION: 77 y.o. female from nursing home, admitted 7/16 with pneumoperitoneum.  Taken for ex-lap where she was found to have perforated proximal descending colon which was repaired and colostomy performed.  ASSESSMENT / PLAN:  PULMONARY A: Respiratory insufficiency - remains intubated post op. Possible HCAP. Hx asthma. P:   Continue pressure support VAP prevention orders Bronchodilators as needed Follow chest x-ray Antibiotics as below  CARDIOVASCULAR A:  Septic shock - due to peritonitis +/- HCAP.  A repeat sepsis assessment has been performed. Paroxysmal atrial fibrillation  Hx HTN, HLD.   P:  Continue phenylphrine, titrate for SBP of 100, MAP 60 Home blood pressure regimen on hold (clonidine, metoprolol) Aspirin, atorvastatin on hold PRN Lopressor IV 5 mg for episodes of atrial fibrillation  RENAL A:   Hypokalemia overnight, K replaced AKI, improving, with creatinine 1.80 on 7/23 Hyperchloremic acidosis P:   Low dose sodium bicarbonate Follow urine  output, BMP Avoid nephrotoxins  GASTROINTESTINAL A:   Pneumoperitoneum - due to perforated descending colon.  S/p ex-lap with repair and creation of colostomy. GI prophylaxis. Nutrition. Hx peptic ulcers, GERD, colitis. P:   Post op care per CCS. SUP: Famotidine changed to pantoprazole per tube given thrombocytopenia NPO. Tolerating trickle tube feeding  HEMATOLOGIC A:   VTE Prophylaxis with SCD.   Thrombocytopenia, worsening to 45 on 7/23, likely due to sepsis but also considering HIT P:  Heparin stopped, follow up HIT antibodies Follow CBC  INFECTIOUS A:   Septic shock - due to peritonitis.  A repeat sepsis assessment has been performed. WBC downtrending, 10.9 on 7/22 P:   Continue Abx as above (vanc / zosyn). Continue eraxis Follow cultures as above. Follow PCT.  ENDOCRINE A:   Hx DM.  Sugars well controlled in mid to upper 100s overnight. P:   SSI per protocol Continue to hold preadmission novolog, levemir.  NEUROLOGIC A:   Sedation needs due to mechanical ventilation. Hx anxiety. P:   Sedation:  Fentanyl 25 mcg, titrate up if patient appears uncomfortable. RASS goal: 0 to -1. Daily wake-up assessment Holding preadmission mirtazapine, Ativan Will need goals of care discussion with family and palliative care due to patient's poor prognosis   Family updated: Update family daily  Interdisciplinary Family Meeting v Palliative Care Meeting:  Due by: 08/18/17.  Amanda C. Shan Levans, MD PGY-2, [facility] 08/17/2017 8:15 AM

## 2017-08-17 NOTE — Consult Note (Addendum)
[place] Nurse ostomy follow up Colostomy pouch intact with good seal, mod amt brown liquid stool.  CCS PA in earlier; informed nurses "if output is minimal from mucous fistula, then may leave pouch off and apply gauze dressing and tape and change PRN when soiled."  Bedside nurse reports there was scant amt tan drainage in the mucous fistula pouch, then when they turned her for a bath a mod amt blood-tinged drainage occurred.  Supplies at the bedside for staff use PRN if leakage occurs.  No family present and patient is not awake. Julien Girt MSN, RN, [place], [place], [place]

## 2017-08-17 NOTE — Consult Note (Addendum)
Consultation Note Date: 08/17/2017   Patient Name: Judith [name]  DOB: 04-28-1940  MRN: [id number]  Age / Sex: 77 y.o., female  PCP: System, Pcp Not In Referring Physician: Edison Pace, Md, MD  Reason for Consultation: Establishing goals of care and Psychosocial/spiritual support  HPI/Patient Profile: 77 y.o. female  with past medical history of IDDM, charcot's joint, dementia, anxiety, proctocolitis, PUD, right BKA, asthma, and arthritis who was admitted on 08/17/2017 with septic shock.  She was found to have a bowel perforation and was taken to the OR.  She received a hemicolectomy with colostomy and mucous fistula.  Her post op course has been a slow decline.  She has not been able to wean from the vent.  She remains on pressor support ([place]).  She is responsive to pain, but not voice or touch.  Clinical Assessment and Goals of Care:  I have reviewed medical records including EPIC notes, labs and imaging, received report from CCM RN, assessed the patient on 7/22 and 7/23 and then attempted to call her daughter [name]) and son Legrand Como) to discuss diagnosis prognosis, [place], EOL wishes, disposition and options.  Voice mail messages were left on 7/22 and 7/23.  Bedside RN has offered to page me when family arrives at bedside.  I will continue to attempt to contact family.   Primary Decision Maker:  NEXT OF KIN  Patient's children Legrand Como and Grayland Ormond.    [name] is an Biomedical scientist in Sports coach who is very influential but she is not the Media planner)    SUMMARY OF RECOMMENDATIONS    PMT meeting for [place] discussion is scheduled for 7/24 at 3:00 pm.  Code Status/Advance Care Planning:  Full  Prognosis:  Pending goals of care.  The patient may have weeks to months if she is artificially supported.  She likely has hours to days if the goals shift to comfort.    Discharge Planning: To Be Determined      Primary  Diagnoses: Present on Admission: . Perforated viscus   I have reviewed the medical record, interviewed the patient and family, and examined the patient. The following aspects are pertinent.  Past Medical History:  Diagnosis Date  . Anxiety   . Arthritis    "left leg" (05/17/2013)  . Asthma   . 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 Closed right ankle fracture   . Colitis 09/06/02  . Diabetes mellitus without complication ([place])   . Fracture of femur, distal, right, closed ([place]) 03/01/2012  . GERD (gastroesophageal reflux disease)   . BHALPFXT(024.0)    "probably weekly" (05/17/2013)  . Hemorrhoid   . High cholesterol   . History of stomach ulcers   . Hyperlipidemia   . Hypertension   . Insulin dependent diabetes mellitus ([place]) 03/01/2012  . Obesity (BMI 30-39.9) 03/03/2012  . Pneumonia    "once" (05/17/2013)  . Proctocolitis 09/04/02   Social History   Socioeconomic History  . Marital status: Single    Spouse name: Not on file  .  Number of children: 7  . Years of education: Not on file  . Highest education level: Not on file  Occupational History  . Occupation: Retired  Scientific laboratory technician  . Financial resource strain: Not on file  . Food insecurity:    Worry: Not on file    Inability: Not on file  . Transportation needs:    Medical: Not on file    Non-medical: Not on file  Tobacco Use  . Smoking status: Former Smoker    Packs/day: 1.00    Years: 5.00    Pack years: 5.00    Last attempt to quit: 01/26/1993    Years since quitting: 24.5  . Smokeless tobacco: Former Systems developer  . Tobacco comment: "quit smoking at ~ age 34"  Substance and Sexual Activity  . Alcohol use: No  . Drug use: No  . Sexual activity: Never  Lifestyle  . Physical activity:    Days per week: Not on file    Minutes per session: Not on file  . Stress: Not on file  Relationships  . Social connections:    Talks on phone: Not on file    Gets together: Not on file    Attends religious service: Not  on file    Active member of club or organization: Not on file    Attends meetings of clubs or organizations: Not on file    Relationship status: Not on file  Other Topics Concern  . Not on file  Social History Narrative   ** Merged History Encounter **       Pt lives at home with son   Uses prosthesis on L BKA with walker   Fairly independent   Family History  Problem Relation Age of Onset  . Diabetes Mother   . Diabetes Father   . Diabetes Sister        x3  . Diabetes Brother        x4  . Colon cancer Neg Hx   . Colon polyps Neg Hx    Scheduled Meds: . chlorhexidine gluconate (MEDLINE KIT)  15 mL Mouth Rinse BID  . feeding supplement (VITAL HIGH PROTEIN)  1,000 mL Per Tube Q24H  . insulin aspart  2-6 Units Subcutaneous Q4H  . mouth rinse  15 mL Mouth Rinse 10 times per day  . pantoprazole sodium  40 mg Per Tube Daily   Continuous Infusions: . sodium chloride Stopped (08/15/17 1821)  . anidulafungin Stopped (08/16/17 1435)  . calcium gluconate 1 g (08/17/17 0940)  . fentaNYL infusion INTRAVENOUS 25 mcg/hr (08/17/17 0800)  . phenylephrine (NEO-SYNEPHRINE) Adult infusion 25 mcg/min (08/17/17 0800)  . piperacillin-tazobactam (ZOSYN)  IV 12.5 mL/hr at 08/17/17 0800  .  sodium bicarbonate  infusion 1000 mL 30 mL/hr at 08/17/17 0800  . sodium chloride Stopped (08/11/17 0209)   And  . sodium chloride     PRN Meds:.sodium chloride, albuterol, fentaNYL, hydroxypropyl methylcellulose / hypromellose, midazolam, sodium chloride Allergies  Allergen Reactions  . Heparin    Review of Systems intubated and does not speak  Physical Exam  Well developed elderly female.  Intubated.  Does not respond to voice or touch Eyes appear very dry, staring but not seeing, she does not track me  CV tachycardic Resp no distress Abdomen covered with bandage, colostomy with liquid stool, fistula with sm. Amt. serosanguinous fluid LLE in prevlon boot w 2+ pitting edema, RLE BKA well  healed.  Vital Signs: BP 97/72   Pulse (!) 113  Temp 97.8 F (36.6 C) (Axillary)   Resp 18   Ht 5' (1.524 m)   Wt 91.4 kg (201 lb 8 oz)   SpO2 95%   BMI 39.35 kg/m  Pain Scale: CPOT     SpO2: SpO2: 95 % O2 Device:SpO2: 95 % O2 Flow Rate: .O2 Flow Rate (L/min): 3 L/min  IO: Intake/output summary:   Intake/Output Summary (Last 24 hours) at 08/17/2017 0948 Last data filed at 08/17/2017 0900 Gross per 24 hour  Intake 2802.98 ml  Output 2795 ml  Net 7.98 ml    LBM: Last BM Date: 08/15/17 Baseline Weight: Weight: 86.2 kg (190 lb 0.6 oz) Most recent weight: Weight: 91.4 kg (201 lb 8 oz)     Palliative Assessment/Data: 10%     Time In: 9:00 Time Out: 10:10 Time Total: 70 min. Greater than 50%  of this time was spent counseling and coordinating care related to the above assessment and plan.  Signed by: [name], PA-C Palliative Medicine Pager: [phone]  Please contact Palliative Medicine Team phone at [phone] for questions and concerns.  For individual provider: See Shea Evans

## 2017-08-17 NOTE — Progress Notes (Signed)
CRITICAL VALUE ALERT  Critical Value:  Potassium 2.7  Date & Time Notied:  08/17/2017 0515  Provider Notified: Warren Lacy  Orders Received/Actions taken: awaiting orders

## 2017-08-17 NOTE — Progress Notes (Signed)
Patient ID: Judith [name], female   DOB: Mar 01, 1940, 77 y.o.   MRN: [id number]    7 Days Post-Op  Subjective: Fentanyl down to 29mcg, but still does not respond, track, or follow commands.  Tolerating TFs at goal.  Still with variable BPs pending a fib and NSR  Objective: Vital signs in last 24 hours: Temp:  [97.5 F (36.4 C)-98.3 F (36.8 C)] 97.8 F (36.6 C) (07/23 0804) Pulse Rate:  [75-130] 120 (07/23 0807) Resp:  [19-30] 20 (07/23 0807) BP: (68-118)/(40-86) 104/86 (07/23 0807) SpO2:  [84 %-100 %] 99 % (07/23 0807) Arterial Line BP: (79-191)/(37-101) 125/54 (07/23 0807) FiO2 (%):  [30 %] 30 % (07/23 0807) Weight:  [91.4 kg (201 lb 8 oz)] 91.4 kg (201 lb 8 oz) (07/23 0500) Last BM Date: 08/15/17  Intake/Output from previous day: 07/22 0701 - 07/23 0700 In: 2947.4 [I.V.:932.8; NG/GT:1100; IV Piggyback:914.7] Out: 2820 [Urine:1845; Stool:975] Intake/Output this shift: Total I/O In: 101.4 [I.V.:41.4; IV Piggyback:60] Out: -   PE: Heart: irregular Abd: soft, midline wound still with copious serous drainage, some necrotic and pale looking adipose tissue, but fascia intact and wound overall clean.  Ostomy with appropriate output.  Mucous fistula with minimal serousang output.  Lab Results:  Recent Labs    08/16/17 0257 08/17/17 0423  WBC 11.8* 10.9*  HGB 9.6* 9.7*  HCT 29.1* 29.2*  PLT 50* 45*   BMET Recent Labs    08/16/17 0257 08/17/17 0423  NA 141 139  K 2.9* 2.7*  CL 114* 116*  CO2 15* 13*  GLUCOSE 184* 217*  BUN 40* 43*  CREATININE 1.96* 1.80*  CALCIUM 6.6* 6.7*   PT/INR No results for input(s): LABPROT, INR in the last 72 hours. CMP     Component Value Date/Time   NA 139 08/17/2017 0423   K 2.7 (LL) 08/17/2017 0423   CL 116 (H) 08/17/2017 0423   CO2 13 (L) 08/17/2017 0423   GLUCOSE 217 (H) 08/17/2017 0423   BUN 43 (H) 08/17/2017 0423   CREATININE 1.80 (H) 08/17/2017 0423   CALCIUM 6.7 (L) 08/17/2017 0423   PROT 6.0 (L) 08/18/2017 1946   ALBUMIN  2.5 (L) 08/18/2017 1946   AST 18 08/14/2017 1946   ALT 12 08/12/2017 1946   ALKPHOS 74 08/01/2017 1946   BILITOT 0.5 08/11/2017 1946   GFRNONAA 26 (L) 08/17/2017 0423   GFRAA 30 (L) 08/17/2017 0423   Lipase     Component Value Date/Time   LIPASE 70 (H) 08/02/2017 1954       Studies/Results: Dg Chest Port 1 View  Result Date: 08/16/2017 CLINICAL DATA:  Acute respiratory failure EXAM: PORTABLE CHEST 1 VIEW COMPARISON:  08/15/2017 FINDINGS: Endotracheal tube, nasogastric catheter and right jugular central line are again identified and stable in appearance. Lungs are well aerated bilaterally. Some improved aeration in the left base is noted when compare with the previous day. No new focal infiltrate or bony abnormality is noted. IMPRESSION: Improving aeration in the left base. Electronically Signed   By: Inez Catalina M.D.   On: 08/16/2017 06:58    Anti-infectives: Anti-infectives (From admission, onward)   Start     Dose/Rate Route Frequency Ordered Stop   08/13/17 1100  anidulafungin (ERAXIS) 100 mg in sodium chloride 0.9 % 100 mL IVPB     100 mg 78 mL/hr over 100 Minutes Intravenous Every 24 hours 08/12/17 1048     08/12/17 1100  anidulafungin (ERAXIS) 200 mg in sodium chloride 0.9 % 200 mL IVPB  200 mg 78 mL/hr over 200 Minutes Intravenous  Once 08/12/17 1048 08/12/17 1648   08/11/17 0600  piperacillin-tazobactam (ZOSYN) IVPB 3.375 g     3.375 g 12.5 mL/hr over 240 Minutes Intravenous Every 8 hours 08/11/17 0025 08/17/17 2359   08/11/17 0115  vancomycin (VANCOCIN) IVPB 750 mg/150 ml premix     750 mg 150 mL/hr over 60 Minutes Intravenous Every 24 hours 08/11/17 0110 08/17/17 0211   08/11/17 0045  piperacillin-tazobactam (ZOSYN) IVPB 3.375 g     3.375 g 100 mL/hr over 30 Minutes Intravenous  Once 08/11/17 0033 08/11/17 0206   08/16/2017 2000  piperacillin-tazobactam (ZOSYN) IVPB 3.375 g  Status:  Discontinued     3.375 g 100 mL/hr over 30 Minutes Intravenous  Once 08/20/2017  1952 08/11/17 0025       Assessment/Plan Perforated proximal descending colon POD6,s/p extended left hemicolectomy with distal transverse colostomy and mucous fistula creation Dr Kae Heller. -TFs at goal rate and tolerating well. -BID midline dressing changes and prn for saturation from serous drainage -WOC RN for ostomy care -cont zosyn and vanc, Eraxis added 7/18 -WBCnormal  Sepsis -secondary to #1 -patient still ill needing pressor support.  -cont abx therapy Thrombocytopenia -plts down to 45K today -heparin held, HIT antibodies being checked, still pending VDRF -may wean to extubate per CCM -doing some spontaneous breathing. Trying to wean fentanyl to see if she can be extubated -she is still unresponsive and does not follow commands  DM -SSI for now HTN -patient is currently hypotensive secondary to septic shock. Hold all antihypertensive meds for now. Acute Renal Failure -Creatinine1.80. UOP 1700cc yesterday FEN -NPO, IVFs, TFs at 50cc/hr, hypokalemia, already replaced by CCM this am, hypocalcemia, replaced with 1g today. VTE - SCDs ID -Zosyn 7/16-->Vanc 7/16-->Eraxis 7/18 -->  Dispo - ICU, greatly appreciate CCM assistance with this patient and her complex care.  Palliative care to meet with family as well.   LOS: 7 days    Henreitta Cea , [facility] Surgery 08/17/2017, 8:47 AM Pager: [phone]

## 2017-08-18 ENCOUNTER — Encounter (HOSPITAL_COMMUNITY): Payer: Self-pay

## 2017-08-18 ENCOUNTER — Other Ambulatory Visit: Payer: Self-pay

## 2017-08-18 DIAGNOSIS — I4891 Unspecified atrial fibrillation: Secondary | ICD-10-CM

## 2017-08-18 DIAGNOSIS — Z66 Do not resuscitate: Secondary | ICD-10-CM

## 2017-08-18 DIAGNOSIS — Z7189 Other specified counseling: Secondary | ICD-10-CM

## 2017-08-18 LAB — GLUCOSE, CAPILLARY
GLUCOSE-CAPILLARY: 231 mg/dL — AB (ref 70–99)
GLUCOSE-CAPILLARY: 167 mg/dL — AB (ref 70–99)
Glucose-Capillary: 142 mg/dL — ABNORMAL HIGH (ref 70–99)
Glucose-Capillary: 172 mg/dL — ABNORMAL HIGH (ref 70–99)
Glucose-Capillary: 189 mg/dL — ABNORMAL HIGH (ref 70–99)
Glucose-Capillary: 214 mg/dL — ABNORMAL HIGH (ref 70–99)
Glucose-Capillary: 231 mg/dL — ABNORMAL HIGH (ref 70–99)

## 2017-08-18 LAB — PROCALCITONIN: Procalcitonin: 32.01 ng/mL

## 2017-08-18 LAB — PHOSPHORUS: PHOSPHORUS: 2.8 mg/dL (ref 2.5–4.6)

## 2017-08-18 LAB — MAGNESIUM: MAGNESIUM: 1.6 mg/dL — AB (ref 1.7–2.4)

## 2017-08-18 MED ORDER — SODIUM CHLORIDE 0.9 % IV SOLN
2.0000 g | Freq: Once | INTRAVENOUS | Status: AC
  Administered 2017-08-18: 2 g via INTRAVENOUS
  Filled 2017-08-18: qty 20

## 2017-08-18 MED ORDER — POTASSIUM CHLORIDE 10 MEQ/50ML IV SOLN
10.0000 meq | INTRAVENOUS | Status: AC
  Administered 2017-08-18 (×6): 10 meq via INTRAVENOUS
  Filled 2017-08-18 (×6): qty 50

## 2017-08-18 MED ORDER — INSULIN ASPART 100 UNIT/ML ~~LOC~~ SOLN
0.0000 [IU] | SUBCUTANEOUS | Status: DC
  Administered 2017-08-18: 2 [IU] via SUBCUTANEOUS
  Administered 2017-08-18 (×2): 3 [IU] via SUBCUTANEOUS
  Administered 2017-08-18: 5 [IU] via SUBCUTANEOUS
  Administered 2017-08-19 (×2): 3 [IU] via SUBCUTANEOUS
  Administered 2017-08-19: 5 [IU] via SUBCUTANEOUS
  Administered 2017-08-19 – 2017-08-20 (×3): 3 [IU] via SUBCUTANEOUS
  Administered 2017-08-20: 2 [IU] via SUBCUTANEOUS
  Administered 2017-08-20 (×2): 3 [IU] via SUBCUTANEOUS
  Administered 2017-08-20: 2 [IU] via SUBCUTANEOUS
  Administered 2017-08-21: 3 [IU] via SUBCUTANEOUS
  Administered 2017-08-21: 2 [IU] via SUBCUTANEOUS
  Administered 2017-08-21 – 2017-08-22 (×6): 3 [IU] via SUBCUTANEOUS
  Administered 2017-08-22: 5 [IU] via SUBCUTANEOUS
  Administered 2017-08-22: 3 [IU] via SUBCUTANEOUS
  Administered 2017-08-22 (×2): 5 [IU] via SUBCUTANEOUS
  Administered 2017-08-23: 8 [IU] via SUBCUTANEOUS
  Administered 2017-08-23 (×3): 2 [IU] via SUBCUTANEOUS
  Administered 2017-08-23 (×2): 3 [IU] via SUBCUTANEOUS
  Administered 2017-08-23: 2 [IU] via SUBCUTANEOUS
  Administered 2017-08-24 (×3): 3 [IU] via SUBCUTANEOUS
  Administered 2017-08-24 – 2017-08-25 (×3): 5 [IU] via SUBCUTANEOUS
  Administered 2017-08-25 (×4): 3 [IU] via SUBCUTANEOUS
  Administered 2017-08-26: 5 [IU] via SUBCUTANEOUS
  Administered 2017-08-26: 2 [IU] via SUBCUTANEOUS
  Administered 2017-08-26: 5 [IU] via SUBCUTANEOUS
  Administered 2017-08-27: 8 [IU] via SUBCUTANEOUS
  Administered 2017-08-27: 3 [IU] via SUBCUTANEOUS
  Administered 2017-08-27: 8 [IU] via SUBCUTANEOUS
  Administered 2017-08-27 (×2): 5 [IU] via SUBCUTANEOUS
  Administered 2017-08-27: 3 [IU] via SUBCUTANEOUS
  Administered 2017-08-28: 5 [IU] via SUBCUTANEOUS
  Administered 2017-08-28: 8 [IU] via SUBCUTANEOUS
  Administered 2017-08-28: 5 [IU] via SUBCUTANEOUS
  Administered 2017-08-28: 8 [IU] via SUBCUTANEOUS

## 2017-08-18 MED ORDER — SODIUM CHLORIDE 0.9 % IV SOLN
100.0000 mg | INTRAVENOUS | Status: AC
  Administered 2017-08-18 – 2017-08-20 (×3): 100 mg via INTRAVENOUS
  Filled 2017-08-18 (×4): qty 100

## 2017-08-18 MED ORDER — PIPERACILLIN-TAZOBACTAM 3.375 G IVPB
3.3750 g | Freq: Three times a day (TID) | INTRAVENOUS | Status: AC
  Administered 2017-08-18 – 2017-08-20 (×8): 3.375 g via INTRAVENOUS
  Filled 2017-08-18 (×10): qty 50

## 2017-08-18 MED ORDER — MAGNESIUM SULFATE 2 GM/50ML IV SOLN
2.0000 g | Freq: Once | INTRAVENOUS | Status: AC
  Administered 2017-08-18: 2 g via INTRAVENOUS
  Filled 2017-08-18: qty 50

## 2017-08-18 NOTE — Progress Notes (Addendum)
Daily Progress Note   Patient Name: Judith [name]       Date: 08/18/2017 DOB: 13-Apr-1940  Age: 77 y.o. MRN#: [id number] Attending Physician: Nolon Nations, MD Primary Care Physician: System, Pcp Not In Admit Date: 08/19/2017  Reason for Consultation/Follow-up: Establishing goals of care and Psychosocial/spiritual support  Subjective: Patient nonverbal/intubated.  Family meeting held.  Present are 4 sons Chrissie Noa, Anola Gurney, [place]) and 1 of 2 daughters, along with [name] (ex-DIL) and two other daughters in law.   The family sees [name] as the key contact person with Legrand Como as the back up contact person.  We discussed Mrs. Grimme current condition (weaning from the vent, weaning from pressors, big open wound, poor wound healing, not currently following commands, new atrial fibrillation unable to take anti-coagulation).    We discussed tracheostomy - after some consideration the family opted against tracheostomy.   We discussed code status.  The family opted to reinstate her DNR.  Family asked "If she had more time on the vent would her mental status continue to improve?"  The patient did track her family around the room and did grip their hands.  The family was pleased with this progress.  We decided to ask CCM to determine the optimal time for 1 way extubation with the best possible chance of success.  The family would like to be present when she is extubated - - please call [name] and Legrand Como -- the family understands that extubation could happen on short notice.  I asked if Mrs. Loyer extubates and does poorly - then what?  The family unanimously  agreed not to let her suffer any longer and to let her go.  If Mrs. Stokke extubates successfully the family understands there are still a lot more  hurdles to jump thru - will she eat enough to sustain herself.  We talked briefly about aspiration as well as her dementia.   Son Legrand Como raised the question of her open wound - will it heal given her brittle DM and her poor nutritional status.    We agreed to continue to have conversations as Mrs. Hirschhorn progresses.  The family is very grateful for the excellent care their mother is receiving from everyone on 57M.  Assessment: Severely deconditioned female who was malnourished and bed bound at baseline with large open stomach  wound, colostomy, mucous fistula, responsive to pain. On pressors with soft BP and atrial fibrillation.   Patient Profile/HPI:  77 y.o. female  with past medical history of IDDM, charcot's joint, dementia, anxiety, proctocolitis, PUD, right BKA, asthma, and arthritis who was admitted on 08/24/2017 with septic shock.  She was found to have a bowel perforation and was taken to the OR.  She received a hemicolectomy with colostomy and mucous fistula.  Her post op course has been a slow decline.  She has not been able to wean from the vent.  She remains on pressor support (Neo).  She is responsive to pain, but not voice or touch.  Length of Stay: 8  Current Medications: Scheduled Meds:  . chlorhexidine gluconate (MEDLINE KIT)  15 mL Mouth Rinse BID  . feeding supplement (VITAL HIGH PROTEIN)  1,000 mL Per Tube Q24H  . insulin aspart  0-15 Units Subcutaneous Q4H  . mouth rinse  15 mL Mouth Rinse 10 times per day  . pantoprazole sodium  40 mg Per Tube Daily    Continuous Infusions: . sodium chloride Stopped (08/15/17 1821)  . anidulafungin 100 mg (08/18/17 1219)  . fentaNYL infusion INTRAVENOUS 25 mcg/hr (08/18/17 1200)  . phenylephrine (NEO-SYNEPHRINE) Adult infusion 20 mcg/min (08/18/17 1200)  . piperacillin-tazobactam (ZOSYN)  IV 12.5 mL/hr at 08/18/17 1200  . potassium chloride 10 mEq (08/18/17 1236)  .  sodium bicarbonate  infusion 1000 mL 75 mL/hr at 08/18/17 1200  .  sodium chloride Stopped (08/11/17 0209)   And  . sodium chloride      PRN Meds: sodium chloride, albuterol, fentaNYL, hydroxypropyl methylcellulose / hypromellose, midazolam, sodium chloride  Physical Exam        Well developed female, intubated.  Eyes open but not tracking.  Unable to follow commands CV tachy with systolic murmur resp no w/c/r intubated, no distress Abdomen colostomy and fistula in place. Large open wound (6"?) packed.  No odor or erythema Upper extremities with 3+ edema LLE BKA, RLE in prevlon boot.  Vital Signs: BP (!) 82/65   Pulse (!) 105   Temp 98.1 F (36.7 C) (Oral)   Resp 16   Ht 5' (1.524 m)   Wt 92.6 kg (204 lb 2.3 oz)   SpO2 100%   BMI 39.87 kg/m  SpO2: SpO2: 100 % O2 Device: O2 Device: Ventilator O2 Flow Rate: O2 Flow Rate (L/min): 3 L/min  Intake/output summary:   Intake/Output Summary (Last 24 hours) at 08/18/2017 1251 Last data filed at 08/18/2017 1200 Gross per 24 hour  Intake 2781.97 ml  Output 3050 ml  Net -268.03 ml   LBM: Last BM Date: 08/17/17 Baseline Weight: Weight: 86.2 kg (190 lb 0.6 oz) Most recent weight: Weight: 92.6 kg (204 lb 2.3 oz)       Palliative Assessment/Data: 10%      Patient Active Problem List   Diagnosis Date Noted  . Acute respiratory failure ([place])   . Acute respiratory failure with hypoxia ([place])   . S/P exploratory laparotomy   . Septic shock ([place])   . Perforated viscus 08/11/2017  . Encounter for hospice care discussion   . Palliative care encounter   . Acute UTI   . AKI (acute kidney injury) ([place])   . Osteomyelitis of left foot ([place])   . UTI (urinary tract infection) 04/23/2017  . Osteomyelitis due to type 2 diabetes mellitus ([place]) 04/23/2017  . Uterine hypertrophy 06/11/2015  . Kidney lesion, native, bilateral 06/11/2015  . Protein-calorie malnutrition, severe ([place])  09/22/2013  . Dementia 09/21/2013  . Constipation 09/21/2013  . Rectal bleeding 09/21/2013  . Fecal impaction ([place])  09/21/2013  . BRBPR (bright red blood per rectum) 09/21/2013  . Altered mental status 05/17/2013  . Altered mental state 05/17/2013  . Delirium due to another medical condition 02/18/2013  . Acute blood loss anemia 03/03/2012  . Hx of right BKA ([place]) 03/03/2012  . Fall 03/03/2012  . Obesity (BMI 30-39.9) 03/03/2012  . Poor nutrition 03/03/2012  . Fracture of femur, distal, right, closed ([place]) 03/01/2012  . Lower urinary tract infectious disease 03/01/2012  . Insulin dependent diabetes mellitus ([place]) 03/01/2012  . Hypertension     Palliative Care Plan    Recommendations/Plan:  No Tracheostomy, No PEG.  Sometime in the next 3 - 5 when CCM decides the patient's mental status is as good as it is going to be and conditions are optimal for a successful extubation, please call the family to be present and proceed with a 1 way extubation - hoping for success.  If Mrs. Gorby extubates and does poorly, family requests that she be comfortable and not suffer.  Code status has been changed back to DNR  Ideally family would love to see her successfully extubate, eat, and be able to return to [facility].  Goals of Care and Additional Recommendations:  Limitations on Scope of Treatment: Full Scope Treatment  Code Status:  DNR  Prognosis:   < 2 weeks.  At this point she is hypotensive with Afib, severely deconditioned, malnourished, demented, with a large abdominal wound.  She has a history of recurrent infections and osteomyelitis.  She has a great deal stacked against her.  Hopefully she will progress and her estimated prognosis can be changed.   Discharge Planning:  To Be Determined  Care plan was discussed with RN, Family, CCM NP.  Thank you for allowing the Palliative Medicine Team to assist in the care of this patient.  Total time spent:  80 min. Time In:  3:00 pm Time out:  4:20 pm     Greater than 50%  of this time was spent counseling and coordinating care  related to the above assessment and plan.  [name], PA-C Palliative Medicine  Please contact Palliative MedicineTeam phone at [phone] for questions and concerns between 7 am - 7 pm.   Please see AMION for individual provider pager numbers.

## 2017-08-18 NOTE — Progress Notes (Signed)
RT note: patient placed on CPAP/PSV of 5/5 at 0830.  Currently tolerating well.  Will continue to monitor.

## 2017-08-18 NOTE — Consult Note (Addendum)
[place] Nurse ostomy follow up:  Surgical team is following for assessment and plan of care to abd wound.  Stoma type/location:LUQ transverse colostomy and LLQ MF Stomal assessment/size: Transverse Colostomymeasures 1 and 1/2 inches, round, red,  edematous, moist, os at center, above skin level. 2-piece pouching system and barrier ring placed today; 2 and 1/4 inch. Mod amt tan unformed stool in the pouch. Mucus Fistulaapprox 1 inch, red, and below skin level. Deep crease to 3:00 o'clock and 9:00 o'clock. Peristomal skin intact. Pouch was off and dressing had been applied, but bedside nurse today states there is too much output for this method and requested that a pouch be re-applied.  Mod amt tan drainage on the previous dressing. Skin barrier ring placed around stoma to fill creases and one piece convex pouch applied. Education provided: Pt is intubated and obtunded and there are no family members in the room.Supplies at bedside for staff nurse use PRN if leakage occurs.  Enrolled patient in [place] Start Discharge program: No Julien Girt MSN, [place], Gerome Sam, [place]

## 2017-08-18 NOTE — Progress Notes (Addendum)
PULMONARY / CRITICAL CARE MEDICINE   Name: Judith [name] MRN: [id number] DOB: [birth year]    ADMISSION DATE:  08/16/2017 CONSULTATION DATE:  08/11/17  REFERRING MD:  Kae Heller  CHIEF COMPLAINT:  Vent Management  HISTORY OF PRESENT ILLNESS:  Pt is encephelopathic; therefore, this HPI is obtained from chart review. Judith [name] is a 77 y.o. female with PMH as outlined below and who resides at nursing home.  She was brought [facility] ED 7/16 after staff at nursing home found her with AMS.  She had been asymptomatic prior to this. In ED, she had CXR and CT A / P which showed large amount of pneumoperitoneum.  She also had hypotension that was slow to respond to IVF's. She was taken to OR by surgery where she had ex-lap which revealed perforated proximal descending colon.  She has an extended left hemicolectomy including mobilization of splenic flexure, creation of colostomy and mucous fistula.  Post OR, she returned to the ICU where she remained ventilated and on low dose pressors.  PCCM was asked to assist with vent management.  SUBJECTIVE:   Patient sedated, on vent On phenylephrine at 25 mcg/min Fentanyl drip at 25 mcg/hr  VITAL SIGNS: BP (!) 94/50   Pulse (!) 113   Temp 97.9 F (36.6 C) (Axillary)   Resp 16   Ht 5' (1.524 m)   Wt 204 lb 2.3 oz (92.6 kg)   SpO2 99%   BMI 39.87 kg/m   HEMODYNAMICS:    VENTILATOR SETTINGS: Vent Mode: PCV FiO2 (%):  [30 %] 30 % Set Rate:  [15 bmp] 15 bmp PEEP:  [5 cmH20] 5 cmH20 Pressure Support:  [5 cmH20] 5 cmH20 Plateau Pressure:  [14 cmH20-15 cmH20] 14 cmH20  INTAKE / OUTPUT: I/O last 3 completed shifts: In: 4295.5 [I.V.:1689.5; NG/GT:1750; IV Piggyback:855.9] Out: 9163 [Urine:2775; Stool:2000]   PHYSICAL EXAMINATION: General: Elderly appearing female, resting in bed, in NAD.  No response to stimulation, eyes open, stable from previous exams. Neuro: Sedated, unresponsive HEENT: [place]/AT. ETT in place. Cardiovascular: irregular rhythm,  slightly tachycardic, no M/R/G.  Lungs: Respirations even and unlabored.  CTAB Abdomen: Midline dressings C/D/I.  Colostomy bag in place with loose brown stool. BS hypoactive. Abdomen soft.  Musculoskeletal: R BKA.  Upper extremities edematous. Skin: Intact, warm, no rashes.   LABS:  BMET Recent Labs  Lab 08/16/17 0257 08/17/17 0423 08/17/17 2022  NA 141 139 140  K 2.9* 2.7* 3.3*  CL 114* 116* 117*  CO2 15* 13* 13*  BUN 40* 43* 47*  CREATININE 1.96* 1.80* 1.77*  GLUCOSE 184* 217* 138*    Electrolytes Recent Labs  Lab 08/14/17 0454 08/14/17 1650  08/16/17 0257 08/17/17 0423 08/17/17 2022 08/18/17 0412  CALCIUM 6.2*  --    < > 6.6* 6.7* 6.8*  --   MG 1.3* 1.7  --  1.8  --   --  1.6*  PHOS 5.3* 5.1*  --   --   --   --  2.8   < > = values in this interval not displayed.    CBC Recent Labs  Lab 08/15/17 0610 08/16/17 0257 08/17/17 0423  WBC 13.3* 11.8* 10.9*  HGB 9.6* 9.6* 9.7*  HCT 30.3* 29.1* 29.2*  PLT 54* 50* 45*    Coag's No results for input(s): APTT, INR in the last 168 hours.  Sepsis Markers Recent Labs  Lab 08/12/17 0426 08/12/17 1615 08/13/17 0258 08/14/17 1454  LATICACIDVEN 6.3* 6.0* 3.6* 2.0*  PROCALCITON >150.00  --  >  150.00  --     ABG Recent Labs  Lab 08/12/17 1440 08/13/17 0334 08/16/17 2015  PHART 7.251* 7.333* 7.410  PCO2ART 26.4* 27.3* 17.1*  PO2ART 76.0* 84.0 123.0*    Liver Enzymes No results for input(s): AST, ALT, ALKPHOS, BILITOT, ALBUMIN in the last 168 hours.  Cardiac Enzymes No results for input(s): TROPONINI, PROBNP in the last 168 hours.  Glucose Recent Labs  Lab 08/17/17 1557 08/17/17 2054 08/17/17 2350 08/18/17 0404 08/18/17 0745 08/18/17 0748  GLUCAP 121* 113* 138* 189* 231* 214*    Imaging No results found.   STUDIES:  CT head 7/16 > chronic small vessel ischemic disease. CT A / P 7/16 > large volume pneumoperitoneum, probable sigmoid perf, b/l lower lobe consolidation.  CULTURES: Blood  7/16 > NG4D Tracheal aspirate 7/17 > NG Final  ANTIBIOTICS: Vanc 7/17 > 7/23 Zosyn 7/17 > Eraxis 7/18 >  SIGNIFICANT EVENTS: 7/16 > admit, to OR for ex-lap. 7/17 > remains intubated after hemicolectomy and colostomy placement 7/18 > worsening labs overnight, poor UOP, HCO3 infusion started 7/19 > improved ABG, improved lactic acid 7/20 > atrial fibrillation with rate into 130s, norepinephrine changed to phenylephrine  7/21 > fentanyl weaned down, vasopressin stopped, intermittently in atrial fibrillation. Potassium replaced overnight, intermittent a fib with drop in pressure during these episodes, responded well to Lopressor 5 mg 7/22 > decreased fentanyl from 50 mcg to 25 mcg with no improvement in neurological status, palliative care consult placed 7/23 > antibiotics course completed  LINES/TUBES: ETT 7/16 >  R IJ CVL 7/16 >  L radial art line 7/16 >   DISCUSSION: 77 y.o. female from nursing home, admitted 7/16 with pneumoperitoneum.  Taken for ex-lap where she was found to have perforated proximal descending colon which was repaired and colostomy performed.  ASSESSMENT / PLAN:  PULMONARY A: Respiratory insufficiency - remains intubated post op. Possible HCAP. Hx asthma. P:   Continue pressure support VAP prevention orders Bronchodilators as needed Follow chest x-ray Antibiotics as below  CARDIOVASCULAR A:  Septic shock - due to peritonitis +/- HCAP.  A repeat sepsis assessment has been performed. Paroxysmal atrial fibrillation  Hx HTN, HLD.   P:  Continue phenylphrine, titrate for SBP of 100, MAP 60 Home blood pressure regimen on hold (clonidine, metoprolol) Aspirin, atorvastatin on hold PRN Lopressor IV 5 mg for episodes of atrial fibrillation  RENAL A:   Hypokalemic to 3.3 AKI, improving, with creatinine 1.77 on 7/24 Hyperchloremic acidosis, persistent P:   sodium bicarbonate @ 75 ml/hr Replete K, Mg, Ca Follow urine output, BMP Avoid  nephrotoxins  GASTROINTESTINAL A:   Pneumoperitoneum - due to perforated descending colon.  S/p ex-lap with repair and creation of colostomy. GI prophylaxis. Nutrition. Hx peptic ulcers, GERD, colitis. P:   Post op care per CCS. SUP: Famotidine changed to pantoprazole per tube given thrombocytopenia NPO. Tolerating trickle tube feeding  HEMATOLOGIC A:   VTE Prophylaxis with SCD.   Thrombocytopenia, worsening to 45 on 7/23, likely due to sepsis HIT antibodies wnl P:  Heparin stopped  INFECTIOUS A:   Septic shock - due to peritonitis.   P:   Continue Zosyn and Eraxis. Follow cultures as above. Follow PCT.  ENDOCRINE A:   Hx DM.  Sugars well controlled in mid to upper 100s to low 200s overnight. P:   SSI per protocol Continue to hold preadmission novolog, levemir.  NEUROLOGIC A:   Sedation needs due to mechanical ventilation. Neurologic status is impeding extubation and indicating poor prognosis  Palliative care consulted, [place] meeting scheduled for 7/24 at 3:00pm Hx anxiety. P:   Sedation:  Fentanyl 25 mcg, titrate up if patient appears uncomfortable. RASS goal: 0 to -1. Daily wake-up assessment Holding preadmission mirtazapine, Ativan   Family updated: Update family daily  Olivine Hiers C. Shan Levans, MD PGY-2, [facility] 08/18/2017 8:05 AM

## 2017-08-18 NOTE — Progress Notes (Signed)
8 Days Post-Op    CC:  Abdominal pain  Subjective: Pt opens her eyes, and looks when you speak but nothing else.  Left hand is in a mitten, marked edema right hand and arm.  Open wound is ok, picture below, draining fluid from the base of the wound, clear fluid.  Still intubated with TF via NG.   Objective: Vital signs in last 24 hours: Temp:  [97.9 F (36.6 C)-98.3 F (36.8 C)] 97.9 F (36.6 C) (07/24 0400) Pulse Rate:  [89-137] 99 (07/24 0845) Resp:  [15-27] 15 (07/24 0845) BP: (85-140)/(36-90) 85/36 (07/24 0845) SpO2:  [94 %-100 %] 100 % (07/24 0845) Arterial Line BP: (95-143)/(35-58) 119/45 (07/24 0845) FiO2 (%):  [30 %] 30 % (07/24 0832) Weight:  [92.6 kg (204 lb 2.3 oz)] 92.6 kg (204 lb 2.3 oz) (07/24 0416) Last BM Date: 08/17/17 2600 IV NPO Urine 1700 Colostomy 1275 Afebrile, BP stable on Neo @ 20 mcg/min SBP 90 range No labs this AM CCM replacing electrolytes No film - CCM rechecking procalcitonin    Intake/Output from previous day: 07/23 0701 - 07/24 0700 In: 2583.3 [I.V.:1152.8; NG/GT:1150; IV Piggyback:280.5] Out: 2975 [Urine:1700; Stool:1275] Intake/Output this shift: Total I/O In: 46.4 [I.V.:46.4] Out: -   General appearance: opens her eyes but not much more interaction. on Vent Resp: clear anterior exam - on Vent GI: open wound draining clear fluid, ostomy working, BS still hypoactive.   Extremities: RBKA with some edema, LLE +2 edema, Upper arms with edema  Lab Results:  Recent Labs    08/16/17 0257 08/17/17 0423  WBC 11.8* 10.9*  HGB 9.6* 9.7*  HCT 29.1* 29.2*  PLT 50* 45*    BMET Recent Labs    08/17/17 0423 08/17/17 2022  NA 139 140  K 2.7* 3.3*  CL 116* 117*  CO2 13* 13*  GLUCOSE 217* 138*  BUN 43* 47*  CREATININE 1.80* 1.77*  CALCIUM 6.7* 6.8*   PT/INR No results for input(s): LABPROT, INR in the last 72 hours.  No results for input(s): AST, ALT, ALKPHOS, BILITOT, PROT, ALBUMIN in the last 168 hours.   Lipase      Component Value Date/Time   LIPASE 70 (H) 08/07/2017 1954     Medications: . chlorhexidine gluconate (MEDLINE KIT)  15 mL Mouth Rinse BID  . feeding supplement (VITAL HIGH PROTEIN)  1,000 mL Per Tube Q24H  . insulin aspart  2-6 Units Subcutaneous Q4H  . mouth rinse  15 mL Mouth Rinse 10 times per day  . pantoprazole sodium  40 mg Per Tube Daily   . sodium chloride Stopped (08/15/17 1821)  . fentaNYL infusion INTRAVENOUS 25 mcg/hr (08/18/17 0800)  . phenylephrine (NEO-SYNEPHRINE) Adult infusion 20 mcg/min (08/18/17 0800)  .  sodium bicarbonate  infusion 1000 mL 75 mL/hr at 08/18/17 0800  . sodium chloride Stopped (08/11/17 0209)   And  . sodium chloride      Assessment/Plan Perforated proximal descending colon POD6,s/p extended left hemicolectomy with distal transverse colostomy and mucous fistula creation Dr Kae Heller. -TFs atgoal rate and tolerating well. -BID midline dressing changesand prn for saturation from serous drainage -WOC RN for ostomy care -cont zosyn and vanc, Eraxis added 7/18 - stopped 7/23 -WBCnormal  Sepsis -secondary to #1 -patient still ill needing pressor support.  -cont abx therapy Thrombocytopenia -plts down to 45K today -heparin held, HIT antibodies being checked, still pending VDRF -may wean to extubate per CCM -doing some spontaneous breathing. Trying to wean fentanyl to see if she can  be extubated -she is still unresponsive and does not follow commands  DM -SSI for now HTN -patient is currently hypotensive secondary to septic shock. Hold all antihypertensive meds for now. Acute Renal Failure -Creatinine1.80. UOP1700cc yesterday FEN -NPO, IVFs, TFs at50cc/hr which is goal,  VTE - SCDs - Heparin held for low platelets  ID -Zosyn 7/16-->Vanc 7/16-->Eraxis 7/18 --> all abx stopped 08/17/17  Per CCM rechecking pro calcitonin today if low will not restart abx Foley:  In    Dispo - ICU, greatly appreciate CCM assistance with  this patient and her complex care.  Palliative care to meet with family today at 3:00 PM.  Continuing supportive care right now.           LOS: 8 days    , 08/18/2017 [phone]

## 2017-08-19 LAB — GLUCOSE, CAPILLARY
GLUCOSE-CAPILLARY: 178 mg/dL — AB (ref 70–99)
GLUCOSE-CAPILLARY: 193 mg/dL — AB (ref 70–99)
Glucose-Capillary: 186 mg/dL — ABNORMAL HIGH (ref 70–99)
Glucose-Capillary: 211 mg/dL — ABNORMAL HIGH (ref 70–99)
Glucose-Capillary: 182 mg/dL — ABNORMAL HIGH (ref 70–99)
Glucose-Capillary: 177 mg/dL — ABNORMAL HIGH (ref 70–99)

## 2017-08-19 NOTE — Consult Note (Signed)
[place] Nurse ostomy follow up: Surgical team is following for assessment and plan of care to abd wound.  Stoma type/location:LUQ transverse colostomy and LLQ MF.  Bedside nurse called WOC since mucous fistula pouch is leaking. Stomal assessment/size: Transverse Colostomy Mod amt tan unformed stool in the pouch. Intact with good seal; pouch was changed yesterday; refer to previous consult note for description. Mucus Fistulaapprox 1 inch, red, and below skin level. Deep crease to 3:00 o'clock and 9:00 o'clock. Peristomal skin intact.Pouch was leaking and there is a large amt bloody drainage from the site.  Discussed appearance with Surgical PA at the bedside.  It is difficult to maintain a pouch seal related to the skin folds in the peristomal area; applied small [place] pouch since it is flexible and has a spout for the drainage. Education provided:Pt is intubated and obtunded and critically ill. Supplies at [employer] nurse use PRN if leakage occurs. Enrolled patient in [place] Start Discharge program: No Julien Girt MSN, [place], Gerome Sam, [place]

## 2017-08-19 NOTE — Progress Notes (Deleted)
Attending note: I have seen and examined the patient with nurse practitioner/resident and agree with the note. History, labs and imaging reviewed.  Unchanged mental status Continues on neo  YDX:AJOINOMVEHM ill appearing HEENT: EOMI, sclera anicteric Neck: No masses; no thyromegaly, ETT Lungs: Clear to auscultation bilaterally; normal respiratory effort CV: Regular rate and rhythm; no murmurs CNO:BSJGGE pouch Ext:No edema; adequate peripheral perfusion Skin: Warm and dry; no rash Neuro:Sedated, unresponsive.   Labs reviewed.  Assessment/Plan: 77 Y/O s/p e lap for perforation Post of course complicated by septic shock, AKI, a fib  Resp Tolerating PSV trials 5/5 Mental status is a barrier toextubation Palliative care meeting today  Shock, septic Paroxysmal A fib Wean down neo as tolerated Rate control with lopressor PRN Continue zosyn, eraxis. Can stop vanco since she received 7 days already [employer].  NAGMA, GI loss Hypokalemia Increase bicarb drip Replete K, mg  Thrombocytopenia HIT ab is negative. SCDs for prophylaxis Holding SQ heparin due to low platelets  Altered mental status Fentanyl, versed as needed.  The patient is critically ill with multiple organ systems failure and requires high complexity decision making for assessment and support, frequent evaluation and titration of therapies, application of advanced monitoring technologies and extensive interpretation of multiple databases.  Critical care time - 35 mins. This represents my time independent of the NPs time taking care of the pt.  Marshell Garfinkel MD [facility] Pager [phone] If no answer or after 3pm call: [phone] 08/19/2017, 10:49 AM

## 2017-08-19 NOTE — Progress Notes (Signed)
PULMONARY / CRITICAL CARE MEDICINE   Name: Judith [name] MRN: [id number] DOB: March 27, 1940    ADMISSION DATE:  08/02/2017 CONSULTATION DATE:  08/11/17  REFERRING MD:  Kae Heller  CHIEF COMPLAINT:  Vent Management  HISTORY OF PRESENT ILLNESS:  Pt is encephelopathic; therefore, this HPI is obtained from chart review. Judith [name] is a 77 y.o. female with PMH as outlined below and who resides at nursing home.  She was brought to [facility] ED 7/16 after staff at nursing home found her with AMS.  She had been asymptomatic prior to this. In ED, she had CXR and CT A / P which showed large amount of pneumoperitoneum.  She also had hypotension that was slow to respond to IVF's. She was taken to OR by surgery where she had ex-lap which revealed perforated proximal descending colon.  She has an extended left hemicolectomy including mobilization of splenic flexure, creation of colostomy and mucous fistula.  Post OR, she returned to the ICU where she remained ventilated and on low dose pressors.  PCCM was asked to assist with vent management.  SUBJECTIVE:   Patient sedated, on vent On phenylephrine at 3 mcg/min Fentanyl drip at 25 mcg/hr  VITAL SIGNS: BP (!) 78/61   Pulse (!) 104   Temp (!) 97.5 F (36.4 C) (Oral)   Resp (!) 27   Ht 5' (1.524 m)   Wt 200 lb 13.4 oz (91.1 kg)   SpO2 100%   BMI 39.22 kg/m   HEMODYNAMICS:    VENTILATOR SETTINGS: Vent Mode: CPAP;PSV FiO2 (%):  [30 %] 30 % Set Rate:  [15 bmp] 15 bmp PEEP:  [5 cmH20] 5 cmH20 Pressure Support:  [5 cmH20-14 cmH20] 5 cmH20  INTAKE / OUTPUT: I/O last 3 completed shifts: In: 5015.3 [I.V.:2628.9; NG/GT:1600; IV Piggyback:786.3] Out: 3920 [Urine:2295; Stool:1625]   PHYSICAL EXAMINATION: General: Elderly appearing female, resting in bed, in NAD.  Tracks with eyes today. Neuro: Sedated, minimally responsive HEENT: [place]/AT. ETT in place. Cardiovascular: irregular rhythm, slightly tachycardic, no M/R/G.  Lungs: Respirations even and  unlabored.  CTAB Abdomen: Midline dressings C/D/I.  Colostomy bag in place with loose brown stool. BS hypoactive. Abdomen soft.  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  Upper extremities edematous. Skin: Intact, warm, no rashes.   LABS:  BMET Recent Labs  Lab 08/16/17 0257 08/17/17 0423 08/17/17 2022  NA 141 139 140  K 2.9* 2.7* 3.3*  CL 114* 116* 117*  CO2 15* 13* 13*  BUN 40* 43* 47*  CREATININE 1.96* 1.80* 1.77*  GLUCOSE 184* 217* 138*    Electrolytes Recent Labs  Lab 08/14/17 0454 08/14/17 1650  08/16/17 0257 08/17/17 0423 08/17/17 2022 08/18/17 0412  CALCIUM 6.2*  --    < > 6.6* 6.7* 6.8*  --   MG 1.3* 1.7  --  1.8  --   --  1.6*  PHOS 5.3* 5.1*  --   --   --   --  2.8   < > = values in this interval not displayed.    CBC Recent Labs  Lab 08/15/17 0610 08/16/17 0257 08/17/17 0423  WBC 13.3* 11.8* 10.9*  HGB 9.6* 9.6* 9.7*  HCT 30.3* 29.1* 29.2*  PLT 54* 50* 45*    Coag's No results for input(s): APTT, INR in the last 168 hours.  Sepsis Markers Recent Labs  Lab 08/12/17 1615 08/13/17 0258 08/14/17 1454 08/18/17 0941  LATICACIDVEN 6.0* 3.6* 2.0*  --   PROCALCITON  --  >150.00  --  32.01    ABG Recent Labs  Lab 08/12/17 1440 08/13/17 0334 08/16/17 2015  PHART 7.251* 7.333* 7.410  PCO2ART 26.4* 27.3* 17.1*  PO2ART 76.0* 84.0 123.0*    Liver Enzymes No results for input(s): AST, ALT, ALKPHOS, BILITOT, ALBUMIN in the last 168 hours.  Cardiac Enzymes No results for input(s): TROPONINI, PROBNP in the last 168 hours.  Glucose Recent Labs  Lab 08/18/17 1201 08/18/17 1535 08/18/17 1937 08/18/17 2334 08/19/17 0349 08/19/17 0746  GLUCAP 231* 167* 142* 172* 186* 193*    Imaging No results found.   STUDIES:  CT head 7/16 > chronic small vessel ischemic disease. CT A / P 7/16 > large volume pneumoperitoneum, probable sigmoid perf, b/l lower lobe consolidation.  CULTURES: Blood 7/16 > NG4D Tracheal aspirate 7/17 >  NG Final  ANTIBIOTICS: Vanc 7/17 > 7/23 Zosyn 7/17 > Eraxis 7/18 >  SIGNIFICANT EVENTS: 7/16 > admit, to OR for ex-lap. 7/17 > remains intubated after hemicolectomy and colostomy placement 7/18 > worsening labs overnight, poor UOP, HCO3 infusion started 7/19 > improved ABG, improved lactic acid 7/20 > atrial fibrillation with rate into 130s, norepinephrine changed to phenylephrine  7/21 > fentanyl weaned down, vasopressin stopped, intermittently in atrial fibrillation. Potassium replaced overnight, intermittent a fib with drop in pressure during these episodes, responded well to Lopressor 5 mg 7/22 > decreased fentanyl from 50 mcg to 25 mcg with no improvement in neurological status, palliative care consult placed 7/23 > antibiotics course completed 7/24 > GOC meeting with family, changed status to DNR, would like patient extubated whenever CCM thinks it will have best chance for success  LINES/TUBES: ETT 7/16 >  R IJ CVL 7/16 >  L radial art line 7/16 >   DISCUSSION: 77 y.o. female from nursing home, admitted 7/16 with pneumoperitoneum.  Taken for ex-lap where she was found to have perforated proximal descending colon which was repaired and colostomy performed.  ASSESSMENT / PLAN:  PULMONARY A: Respiratory insufficiency - remains intubated post op. Possible HCAP. Hx asthma. P:   Continue pressure support VAP prevention orders Bronchodilators as needed Follow chest x-ray Antibiotics as below Will one-way extubate today when family is present  CARDIOVASCULAR A:  Septic shock, improved - due to peritonitis +/- HCAP.  A repeat sepsis assessment has been performed. Paroxysmal atrial fibrillation  Hx HTN, HLD.   P:  Will stop phenylephrine, can restart if needed Home blood pressure regimen on hold (clonidine, metoprolol) Aspirin, atorvastatin on hold PRN Lopressor IV 5 mg for episodes of atrial fibrillation  RENAL A:   Hypokalemic to 3.3 AKI, improving, with  creatinine 1.77 on 7/24 Hyperchloremic acidosis, persistent P:   sodium bicarbonate @ 75 ml/hr Follow urine output, BMP Avoid nephrotoxins  GASTROINTESTINAL A:   Pneumoperitoneum - due to perforated descending colon.  S/p ex-lap with repair and creation of colostomy. GI prophylaxis. Nutrition. Hx peptic ulcers, GERD, colitis. P:   Post op care per CCS. SUP: Famotidine changed to pantoprazole per tube given thrombocytopenia NPO. Tolerating trickle tube feeding  HEMATOLOGIC A:   VTE Prophylaxis with SCD.   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  Procalcitonin continues to be elevated.  P:   Continue Zosyn and Eraxis. Follow cultures as above.  ENDOCRINE A:   Hx DM.  Sugars well controlled in mid to upper 100s to low 200s overnight. P:  SSI per protocol Continue to hold preadmission novolog, levemir.  NEUROLOGIC A:   Sedation not currently needed Hx anxiety. P:   Sedation:  none RASS goal: 0 to -1. Daily wake-up assessment Holding preadmission mirtazapine, Ativan   Family updated: Update family daily  Luceil Herrin C. Shan Levans, MD PGY-2, [facility] 08/19/2017 9:37 AM

## 2017-08-19 NOTE — Progress Notes (Addendum)
9 Days Post-Op    CC:  Abdominal pain  Subjective: She is following some and we ask her to stick out her tongue and she tried.  CCM is looking into the decisions yesterday by family about extubation.  Her wound is a little soupy and draining a good deal, colostomy is working and she is on TF thru her NG.  She remains very edematous all over.  She has been bleeding from the mucus fistula.  Objective: Vital signs in last 24 hours: Temp:  [97.4 F (36.3 C)-98.1 F (36.7 C)] 97.5 F (36.4 C) (07/25 0748) Pulse Rate:  [92-134] 105 (07/25 0700) Resp:  [14-30] 24 (07/25 0700) BP: (59-122)/(35-87) 104/81 (07/25 0700) SpO2:  [95 %-100 %] 100 % (07/25 0700) Arterial Line BP: (82-270)/(4-71) 120/45 (07/25 0700) FiO2 (%):  [30 %] 30 % (07/25 0413) Weight:  [91.1 kg (200 lb 13.4 oz)] 91.1 kg (200 lb 13.4 oz) (07/25 0500) Last BM Date: 08/19/17 2100 IV 1050 TF Urine 1395 Colostomy 1175 Afebrile, VSS No labs today No films today Calcitonin 32/ abx restarted 7/24 Intake/Output from previous day: 07/24 0701 - 07/25 0700 In: 3895 [I.V.:2103.7; NG/GT:1050; IV Piggyback:741.3] Out: 2570 [Urine:1395; XKGYJ:8563] Intake/Output this shift: No intake/output data recorded.  General appearance: she is tracking this AM and trying to follow some commands Resp: clear anterior exam GI: open abdomen is a little soupy and draining allot of fluid, wound is tracking some beyond the bounds of the incision.    Lab Results:  Recent Labs    08/17/17 0423  WBC 10.9*  HGB 9.7*  HCT 29.2*  PLT 45*    BMET Recent Labs    08/17/17 0423 08/17/17 2022  NA 139 140  K 2.7* 3.3*  CL 116* 117*  CO2 13* 13*  GLUCOSE 217* 138*  BUN 43* 47*  CREATININE 1.80* 1.77*  CALCIUM 6.7* 6.8*   PT/INR No results for input(s): LABPROT, INR in the last 72 hours.  No results for input(s): AST, ALT, ALKPHOS, BILITOT, PROT, ALBUMIN in the last 168 hours.   Lipase     Component Value Date/Time   LIPASE 70 (H)  08/25/2017 1954     Medications: . chlorhexidine gluconate (MEDLINE KIT)  15 mL Mouth Rinse BID  . feeding supplement (VITAL HIGH PROTEIN)  1,000 mL Per Tube Q24H  . insulin aspart  0-15 Units Subcutaneous Q4H  . mouth rinse  15 mL Mouth Rinse 10 times per day  . pantoprazole sodium  40 mg Per Tube Daily   . sodium chloride 10 mL/hr at 08/19/17 0700  . anidulafungin Stopped (08/18/17 1359)  . fentaNYL infusion INTRAVENOUS 25 mcg/hr (08/19/17 0700)  . phenylephrine (NEO-SYNEPHRINE) Adult infusion 3 mcg/min (08/19/17 0700)  . piperacillin-tazobactam (ZOSYN)  IV 3.375 g (08/19/17 0050)  .  sodium bicarbonate  infusion 1000 mL 75 mL/hr at 08/19/17 0700   Anti-infectives (From admission, onward)   Start     Dose/Rate Route Frequency Ordered Stop   08/18/17 1200  anidulafungin (ERAXIS) 100 mg in sodium chloride 0.9 % 100 mL IVPB     100 mg 78 mL/hr over 100 Minutes Intravenous Every 24 hours 08/18/17 0907     08/18/17 1000  piperacillin-tazobactam (ZOSYN) IVPB 3.375 g     3.375 g 12.5 mL/hr over 240 Minutes Intravenous Every 8 hours 08/18/17 0907     08/13/17 1100  anidulafungin (ERAXIS) 100 mg in sodium chloride 0.9 % 100 mL IVPB  Status:  Discontinued     100 mg  78 mL/hr over 100 Minutes Intravenous Every 24 hours 08/12/17 1048 08/17/17 1308   08/12/17 1100  anidulafungin (ERAXIS) 200 mg in sodium chloride 0.9 % 200 mL IVPB     200 mg 78 mL/hr over 200 Minutes Intravenous  Once 08/12/17 1048 08/12/17 1648   08/11/17 0600  piperacillin-tazobactam (ZOSYN) IVPB 3.375 g     3.375 g 12.5 mL/hr over 240 Minutes Intravenous Every 8 hours 08/11/17 0025 08/17/17 2359   08/11/17 0115  vancomycin (VANCOCIN) IVPB 750 mg/150 ml premix     750 mg 150 mL/hr over 60 Minutes Intravenous Every 24 hours 08/11/17 0110 08/17/17 0211   08/11/17 0045  piperacillin-tazobactam (ZOSYN) IVPB 3.375 g     3.375 g 100 mL/hr over 30 Minutes Intravenous  Once 08/11/17 0033 08/11/17 0206   08/07/2017 2000   piperacillin-tazobactam (ZOSYN) IVPB 3.375 g  Status:  Discontinued     3.375 g 100 mL/hr over 30 Minutes Intravenous  Once 08/12/2017 1952 08/11/17 0025      Assessment/Plan Perforated proximal descending colon Extended left hemicolectomy with 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  POD8, -TFs atgoal rate and tolerating well. -BID midline dressing changesand prn for saturation from serous drainage -WOC RN for ostomy care -cont zosyn and vanc, Eraxis added 7/18 - stopped 7/23 -WBCnormal  Sepsis -secondary to #1 -patient still ill needing pressor support.  -cont abx therapy Thrombocytopenia -plts down to 45K today -heparin held, HIT antibodies being checked, still pending VDRF -may wean to extubate per CCM -doing some spontaneous breathing. Trying to wean fentanyl to see if she can be extubated -she is still unresponsive and does not follow commands  DM -SSI for now HTN -patient is currently hypotensive secondary to septic shock. Hold all antihypertensive meds for now. Acute Renal Failure -Creatinine1.80. [id number] yesterday  FEN -NPO, IVFs, TFs at50cc/hr which is goal,  VTE - SCDs - Heparin held for low platelets  ID -Zosyn 7/16-->Vanc 7/16-->Eraxis 7/18 --> all abx stopped 08/17/17 (Calcitonin 32) Zosyn/Eraxis  restarted 7/24  Foley:  In    Plan:  Anticipating one way extubation, continue tube feeds, and supportive care.  I will order labs for the AM.          LOS: 9 days    Judith [name] 08/19/2017 [phone]

## 2017-08-19 NOTE — Procedures (Signed)
Extubation Procedure Note  Patient Details:   Name: Judith [name] DOB: October 15, 1940 MRN: [id number]   Airway Documentation:    Vent end date: 08/19/17 Vent end time: 1811   Evaluation  O2 sats: stable throughout Complications: No apparent complications Patient did tolerate procedure well. Bilateral Breath Sounds: Clear, Diminished   Yes   Pt extubated one way to 4L [place] per MD order. Positive cuff leak noted. Sats and vitals are stable. No complications noted.   Vernona Rieger 08/19/2017, 6:11 PM

## 2017-08-19 NOTE — Clinical Social Work Note (Signed)
Clinical Social Work Assessment  Patient Details  Name: Judith [name] MRN: [id number] Date of Birth: [birth year]  Date of referral:  08/19/17               Reason for consult:  Facility Placement, Discharge Planning                Permission sought to share information with:  Family Supports Permission granted to share information::  Yes, Release of Information Signed  Name::     Talyn Eddie   Agency::  family  Relationship::   son   Contact Information:  [place] [phone]  Housing/Transportation Living arrangements for the past 2 months:  Skilled Nursing Facility([facility] ) Source of Information:  Adult Children Patient Interpreter Needed:  None Criminal Activity/Legal Involvement Pertinent to Current Situation/Hospitalization:  No - Comment as needed Significant Relationships:  Adult Children Lives with:  Facility Resident Do you feel safe going back to the place where you live?  Yes Need for family participation in patient care:  Yes (Comment)  Care giving concerns:  CSW consulted as pt is expected to discharge back to SNF once medically stable.    Social Worker assessment / plan:  CSW unable to speak with pt as pt remains intubated. CSW spoke with pt's son Legrand Como for this assessment. CSW was informed that pt is from [facility] where pt has been for over a year. Per Legrand Como pt is there long term and the plan is for pt to return back to the facility once stable. CSW was informed that pt has support from children as well as other relatives.   During this assessment son appeared to be engaged in pt's care and answered questions to best of knowledge.   Employment status:    Insurance information:  Medicare PT Recommendations:  Not assessed at this time Information / Referral to community resources:  [place]  Patient/Family's Response to care:  Pt's son response to care was understanding and agreeable to plan of care at this time. CSW  did advise son that if anything changed in pt's care impacting not being able to return to there facility CSW would speak with son and other family as needed about other placement options.   Patient/Family's Understanding of and Emotional Response to Diagnosis, Current Treatment, and Prognosis:  Pt's family response to care was understanding at this time. No further questions or concerns have been presented to CSW at this time.   Emotional Assessment Appearance:  Appears stated age Attitude/Demeanor/Rapport:  Unable to Assess Affect (typically observed):  Unable to Assess Orientation:  (pt intubated at this time. ) Alcohol / Substance use:  Not Applicable Psych involvement (Current and /or in the community):  No (Comment)  Discharge Needs  Concerns to be addressed:  Denies Needs/Concerns at this time Readmission within the last 30 days:  No Current discharge risk:  Dependent with Mobility Barriers to Discharge:  Continued Medical Work up   [employer], [place] 08/19/2017, 8:01 AM

## 2017-08-20 DIAGNOSIS — K631 Perforation of intestine (nontraumatic): Secondary | ICD-10-CM

## 2017-08-20 LAB — BASIC METABOLIC PANEL
Anion gap: 9 (ref 5–15)
BUN: 51 mg/dL — AB (ref 8–23)
CALCIUM: 7 mg/dL — AB (ref 8.9–10.3)
CO2: 22 mmol/L (ref 22–32)
CREATININE: 1.63 mg/dL — AB (ref 0.44–1.00)
Chloride: 113 mmol/L — ABNORMAL HIGH (ref 98–111)
GFR, EST AFRICAN AMERICAN: 34 mL/min — AB (ref >60)
GFR, EST NON AFRICAN AMERICAN: 29 mL/min — AB (ref >60)
Glucose, Bld: 172 mg/dL — ABNORMAL HIGH (ref 70–99)
Potassium: 2.8 mmol/L — ABNORMAL LOW (ref 3.5–5.1)
SODIUM: 144 mmol/L (ref 135–145)

## 2017-08-20 LAB — CBC
HCT: 26.8 % — ABNORMAL LOW (ref 36.0–46.0)
HEMOGLOBIN: 8.7 g/dL — AB (ref 12.0–15.0)
MCH: 26.7 pg (ref 26.0–34.0)
MCHC: 32.5 g/dL (ref 30.0–36.0)
MCV: 82.2 fL (ref 78.0–100.0)
PLATELETS: 73 10*3/uL — AB (ref 150–400)
RBC: 3.26 MIL/uL — ABNORMAL LOW (ref 3.87–5.11)
RDW: 20 % — AB (ref 11.5–15.5)
WBC: 7.5 10*3/uL (ref 4.0–10.5)

## 2017-08-20 LAB — GLUCOSE, CAPILLARY
GLUCOSE-CAPILLARY: 136 mg/dL — AB (ref 70–99)
GLUCOSE-CAPILLARY: 151 mg/dL — AB (ref 70–99)
GLUCOSE-CAPILLARY: 168 mg/dL — AB (ref 70–99)
GLUCOSE-CAPILLARY: 177 mg/dL — AB (ref 70–99)
Glucose-Capillary: 115 mg/dL — ABNORMAL HIGH (ref 70–99)
Glucose-Capillary: 156 mg/dL — ABNORMAL HIGH (ref 70–99)

## 2017-08-20 LAB — COMPREHENSIVE METABOLIC PANEL
ALK PHOS: 123 U/L (ref 38–126)
ALT: 11 U/L (ref 0–44)
AST: 20 U/L (ref 15–41)
Albumin: <1 g/dL — ABNORMAL LOW (ref 3.5–5.0)
Anion gap: 8 (ref 5–15)
BILIRUBIN TOTAL: 0.9 mg/dL (ref 0.3–1.2)
BUN: 42 mg/dL — ABNORMAL HIGH (ref 8–23)
CALCIUM: 6.2 mg/dL — AB (ref 8.9–10.3)
CO2: 20 mmol/L — AB (ref 22–32)
CREATININE: 1.41 mg/dL — AB (ref 0.44–1.00)
Chloride: 114 mmol/L — ABNORMAL HIGH (ref 98–111)
GFR calc Af Amer: 40 mL/min — ABNORMAL LOW (ref >60)
GFR, EST NON AFRICAN AMERICAN: 35 mL/min — AB (ref >60)
Glucose, Bld: 148 mg/dL — ABNORMAL HIGH (ref 70–99)
Potassium: 2.6 mmol/L — CL (ref 3.5–5.1)
SODIUM: 142 mmol/L (ref 135–145)
TOTAL PROTEIN: 3.6 g/dL — AB (ref 6.5–8.1)

## 2017-08-20 LAB — MAGNESIUM: Magnesium: 1.5 mg/dL — ABNORMAL LOW (ref 1.7–2.4)

## 2017-08-20 MED ORDER — CHLORHEXIDINE GLUCONATE 0.12 % MT SOLN
15.0000 mL | Freq: Two times a day (BID) | OROMUCOSAL | Status: DC
  Administered 2017-08-20 – 2017-09-01 (×25): 15 mL via OROMUCOSAL
  Filled 2017-08-20 (×15): qty 15

## 2017-08-20 MED ORDER — SODIUM CHLORIDE 0.9 % IV SOLN
1.0000 g | Freq: Once | INTRAVENOUS | Status: AC
  Administered 2017-08-20: 1 g via INTRAVENOUS
  Filled 2017-08-20: qty 10

## 2017-08-20 MED ORDER — CALCIUM CHLORIDE 10 % IV SOLN: 1.0000 g | Freq: Once | INTRAVENOUS | Status: DC

## 2017-08-20 MED ORDER — POTASSIUM CHLORIDE 10 MEQ/50ML IV SOLN
10.0000 meq | INTRAVENOUS | Status: AC
  Administered 2017-08-20 (×5): 10 meq via INTRAVENOUS
  Filled 2017-08-20 (×6): qty 50

## 2017-08-20 MED ORDER — ORAL CARE MOUTH RINSE
15.0000 mL | Freq: Two times a day (BID) | OROMUCOSAL | Status: DC
  Administered 2017-08-20 – 2017-08-31 (×21): 15 mL via OROMUCOSAL

## 2017-08-20 MED ORDER — VITAL AF 1.2 CAL PO LIQD
1000.0000 mL | ORAL | Status: DC
  Administered 2017-08-20 – 2017-08-21 (×2): 1000 mL
  Filled 2017-08-20: qty 1000

## 2017-08-20 MED ORDER — MAGNESIUM SULFATE 2 GM/50ML IV SOLN
2.0000 g | Freq: Once | INTRAVENOUS | Status: AC
  Administered 2017-08-20: 2 g via INTRAVENOUS
  Filled 2017-08-20: qty 50

## 2017-08-20 MED ORDER — POTASSIUM CHLORIDE 10 MEQ/50ML IV SOLN
10.0000 meq | INTRAVENOUS | Status: AC
  Administered 2017-08-20 (×3): 10 meq via INTRAVENOUS
  Filled 2017-08-20 (×3): qty 50

## 2017-08-20 NOTE — Progress Notes (Signed)
PULMONARY / CRITICAL CARE MEDICINE   Name: Judith [name] MRN: [id number] DOB: [birth year]    ADMISSION DATE:  08/09/2017 CONSULTATION DATE:  08/11/17  REFERRING MD:  Kae Heller  CHIEF COMPLAINT:  Vent Management  HISTORY OF PRESENT ILLNESS:  Pt is encephelopathic; therefore, this HPI is obtained from chart review. Judith [name] is a 77 y.o. female with PMH as outlined below and who resides at nursing home.  She was brought to [facility] ED 7/16 after staff at nursing home found her with AMS.  She had been asymptomatic prior to this. In ED, she had CXR and CT A / P which showed large amount of pneumoperitoneum.  She also had hypotension that was slow to respond to IVF's. She was taken to OR by surgery where she had ex-lap which revealed perforated proximal descending colon.  She has an extended left hemicolectomy including mobilization of splenic flexure, creation of colostomy and mucous fistula.  Post OR, she returned to the ICU where she remained ventilated and on low dose pressors.  PCCM was asked to assist with vent management.  SUBJECTIVE:   Nonverbal  VITAL SIGNS: BP (!) 114/54   Pulse 96   Temp (!) 96.9 F (36.1 C) (Axillary)   Resp (!) 21   Ht 5' (1.524 m)   Wt 200 lb 9.9 oz (91 kg)   SpO2 100%   BMI 39.18 kg/m   HEMODYNAMICS:    VENTILATOR SETTINGS: Vent Mode: PSV;CPAP FiO2 (%):  [30 %] 30 % PEEP:  [5 cmH20] 5 cmH20 Pressure Support:  [5 cmH20] 5 cmH20  INTAKE / OUTPUT: I/O last 3 completed shifts: In: 5130.9 [P.O.:16; I.V.:3091.7; NG/GT:1650; IV Piggyback:373.2] Out: 3605 [Urine:1905; Stool:1700]   PHYSICAL EXAMINATION: General: Elderly appearing female, resting in bed, in NAD.  Appears to follow some commands Neuro: Minimally responsive HEENT: [place]/AT.  Cardiovascular: regular rhythm, normal rate, no M/R/G.  Lungs: Respirations even and unlabored.  Some crackles in bilateral bases Abdomen: Midline dressings C/D/I.  Colostomy bag in place with loose brown stool and some  blood. BS hypoactive. Abdomen soft.  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  Upper extremities edematous. Skin: Intact, warm, no rashes.   LABS:  BMET Recent Labs  Lab 08/17/17 0423 08/17/17 2022 08/20/17 0506  NA 139 140 144  K 2.7* 3.3* 2.8*  CL 116* 117* 113*  CO2 13* 13* 22  BUN 43* 47* 51*  CREATININE 1.80* 1.77* 1.63*  GLUCOSE 217* 138* 172*    Electrolytes Recent Labs  Lab 08/14/17 0454 08/14/17 1650  08/16/17 0257 08/17/17 0423 08/17/17 2022 08/18/17 0412 08/20/17 0506  CALCIUM 6.2*  --    < > 6.6* 6.7* 6.8*  --  7.0*  MG 1.3* 1.7  --  1.8  --   --  1.6*  --   PHOS 5.3* 5.1*  --   --   --   --  2.8  --    < > = values in this interval not displayed.    CBC Recent Labs  Lab 08/16/17 0257 08/17/17 0423 08/20/17 0506  WBC 11.8* 10.9* 7.5  HGB 9.6* 9.7* 8.7*  HCT 29.1* 29.2* 26.8*  PLT 50* 45* 73*    Coag's No results for input(s): APTT, INR in the last 168 hours.  Sepsis Markers Recent Labs  Lab 08/14/17 1454 08/18/17 0941  LATICACIDVEN 2.0*  --   PROCALCITON  --  32.01    ABG Recent Labs  Lab 08/16/17 2015  PHART 7.410  PCO2ART  17.1*  PO2ART 123.0*    Liver Enzymes No results for input(s): AST, ALT, ALKPHOS, BILITOT, ALBUMIN in the last 168 hours.  Cardiac Enzymes No results for input(s): TROPONINI, PROBNP in the last 168 hours.  Glucose Recent Labs  Lab 08/19/17 1122 08/19/17 1530 08/19/17 1920 08/19/17 2349 08/20/17 0346 08/20/17 0727  GLUCAP 211* 182* 177* 178* 177* 136*    Imaging No results found.   STUDIES:  CT head 7/16 > chronic small vessel ischemic disease. CT A / P 7/16 > large volume pneumoperitoneum, probable sigmoid perf, b/l lower lobe consolidation.  CULTURES: Blood 7/16 > NG4D Tracheal aspirate 7/17 > NG Final  ANTIBIOTICS: Vanc 7/17 > 7/23 Zosyn 7/17 > 7/26 Eraxis 7/18 > 7/26  SIGNIFICANT EVENTS: 7/16 > admit, to OR for ex-lap. 7/17 > remains intubated after hemicolectomy and  colostomy placement 7/18 > worsening labs overnight, poor UOP, HCO3 infusion started 7/19 > improved ABG, improved lactic acid 7/20 > atrial fibrillation with rate into 130s, norepinephrine changed to phenylephrine  7/21 > fentanyl weaned down, vasopressin stopped, intermittently in atrial fibrillation. Potassium replaced overnight, intermittent a fib with drop in pressure during these episodes, responded well to Lopressor 5 mg 7/22 > decreased fentanyl from 50 mcg to 25 mcg with no improvement in neurological status, palliative care consult placed 7/23 > antibiotics course completed 7/24 > GOC meeting with family, changed status to DNR, would like patient extubated whenever CCM thinks it will have best chance for success 7/25 > Extubated successfully, no longer on pressors or sedation  LINES/TUBES: ETT 7/16 > 7/25 R IJ CVL 7/16 >  L radial art line 7/16 >   DISCUSSION: 77 y.o. female from nursing home, admitted 7/16 with pneumoperitoneum.  Taken for ex-lap where she was found to have perforated proximal descending colon which was repaired and colostomy performed.  ASSESSMENT / PLAN:  PULMONARY A: Respiratory insufficiency, improved Possible HCAP. Hx asthma. P:   Bronchodilators as needed Follow chest x-ray Antibiotics as below  CARDIOVASCULAR A:  Septic shock, resolved Paroxysmal atrial fibrillation  Hx HTN, HLD.   P:  Home blood pressure regimen on hold (clonidine, metoprolol) Aspirin, atorvastatin on hold PRN Lopressor IV 5 mg for episodes of atrial fibrillation  RENAL A:   Hypokalemic to 2.8 AKI, improving, with creatinine 1.63 on 7/26 P:   Stop sodium bicarbonate  Follow urine output, BMP Avoid nephrotoxins  GASTROINTESTINAL A:   Pneumoperitoneum - due to perforated descending colon.  S/p ex-lap with repair and creation of colostomy. GI prophylaxis. Nutrition. Hx peptic ulcers, GERD, colitis. P:   Post op care per CCS. SUP: Famotidine changed to  pantoprazole per tube given thrombocytopenia NPO. Tolerating trickle tube feeding  HEMATOLOGIC A:   VTE Prophylaxis with SCD.   Thrombocytopenia, improving to 73 on 7/26 HIT antibodies neg P:  Heparin stopped Check CBC daily  INFECTIOUS A:   Septic shock - due to peritonitis.  Procalcitonin continues to be elevated.  P:   Continue Zosyn and Eraxis. Follow cultures as above.  ENDOCRINE A:   Hx DM.  Sugars well controlled in mid to upper 100s to low 200s overnight. P:   SSI per protocol Continue to hold preadmission novolog, levemir.  NEUROLOGIC A:   Sedation not currently needed Hx anxiety. P:   Sedation:  none RASS goal: 0 to -1. Daily wake-up assessment Holding preadmission mirtazapine, Ativan   Family updated: Update family daily  Amanda C. Shan Levans, MD PGY-2, [facility] 08/20/2017 8:42 AM

## 2017-08-20 NOTE — Progress Notes (Addendum)
10 Days Post-Op    CC:  Abdominal pain  Subjective: Pt is extubated, course breath sounds, not really responsive.  She looks around some, and looks at you when your in her face but is not following any directions. Breath sounds are course.  She remains very edematous all over.  Midline wound is about the same, with a fair amount of serous drainage. Colostomy is working.  Tube feeds per NG are at 50 cc/hr.  Objective: Vital signs in last 24 hours: Temp:  [96.9 F (36.1 C)-97.9 F (36.6 C)] 96.9 F (36.1 C) (07/26 0745) Pulse Rate:  [84-119] 96 (07/26 0800) Resp:  [15-36] 21 (07/26 0800) BP: (78-146)/(30-86) 114/54 (07/26 0800) SpO2:  [98 %-100 %] 100 % (07/26 0800) Arterial Line BP: (114-182)/(33-77) 159/53 (07/26 0800) FiO2 (%):  [30 %] 30 % (07/25 1538) Weight:  [91 kg (200 lb 9.9 oz)] 91 kg (200 lb 9.9 oz) (07/26 0416) Last BM Date: 08/20/17 16 PO recorded 1050 Tube feed 2015 IV Urine 1235 Colostomy 1000 Afebrile, VSS K+ 2.8 - mag ordered Creatinine is stable WBC is 7.5 H/H show 1 gm drop in hemoglobin since 7/23; Platelets are coming up off Heparin/Lovenox  No films  Intake/Output from previous day: 07/25 0701 - 07/26 0700 In: 3361.6 [P.O.:16; I.V.:2015.5; NG/GT:1050; IV Piggyback:280.2] Out: 2235 [Urine:1235; Stool:1000] Intake/Output this shift: Total I/O In: -  Out: 100 [Urine:100]     General appearance: she is extubated and looks around some, but does not follow any directions, nor did she with her family according to the nursing staff.  Elderly & frail in apperance.  NG in place for tube feeds. Resp: course breath sounds GI: open abdomen, picture above from 7/24, but not much difference.  Fair amount of serous drainage.  Colostomy is working with good output.  Less drainage from mucus fistula today, still looks a little bloody Extremities: all are edematous with some blistering on the left hand R BKA   Lab Results:  Recent Labs    08/20/17 0506  WBC  7.5  HGB 8.7*  HCT 26.8*  PLT 73*    BMET Recent Labs    08/17/17 2022 08/20/17 0506  NA 140 144  K 3.3* 2.8*  CL 117* 113*  CO2 13* 22  GLUCOSE 138* 172*  BUN 47* 51*  CREATININE 1.77* 1.63*  CALCIUM 6.8* 7.0*   PT/INR No results for input(s): LABPROT, INR in the last 72 hours.  No results for input(s): AST, ALT, ALKPHOS, BILITOT, PROT, ALBUMIN in the last 168 hours.   Lipase     Component Value Date/Time   LIPASE 70 (H) 08/11/2017 1954     Prior to Admission medications   Medication Sig Start Date End Date Taking? Authorizing Provider  acetaminophen (TYLENOL) 325 MG tablet Take 650 mg by mouth every 6 (six) hours as needed for mild pain.   Yes [provider]  Amino Acids-Protein Hydrolys (FEEDING SUPPLEMENT, PRO-STAT SUGAR FREE 64,) LIQD Take 30 mLs by mouth 2 (two) times daily.   Yes [provider]  Ascorbic Acid (VITAMIN C PO) Take 1 tablet by mouth 2 (two) times daily.    Yes [provider]  aspirin 325 MG tablet Take 325 mg by mouth daily with breakfast.    Yes [provider]  atorvastatin (LIPITOR) 20 MG tablet Take 20 mg by mouth every evening.    Yes [provider]  Dextrose, Diabetic Use, (INSTA-GLUCOSE) 77.4 % GEL Take 1 vial by mouth daily as  needed (Diabetes, Give when CBG is <60).   Yes [provider]  insulin aspart (NOVOLOG) 100 UNIT/ML injection Inject 0-15 Units into the skin See admin instructions. Three times daily Per sliding scale   0-150 = 0 units 151-200 = 3 units 201-250 = 5 units 251-300 = 7 units 301-350 = 9 units 351-400 = 11 units 401-450 = 13 units 451-500 = 15 units and call provider   Yes [provider]  insulin detemir (LEVEMIR) 100 UNIT/ML injection Inject 0.06 mLs (6 Units total) into the skin daily. Patient taking differently: Inject 15 Units into the skin every 12 (twelve) hours.  04/28/17  Yes Aline August, MD  loperamide (IMODIUM) 2 MG capsule Take 2 mg by  mouth every 8 (eight) hours as needed for diarrhea or loose stools.   Yes [provider]  magnesium hydroxide (MILK OF MAGNESIA) 400 MG/5ML suspension Take 10 mLs by mouth daily as needed for mild constipation.    Yes [provider]  megestrol (MEGACE) 400 MG/10ML suspension Take 800 mg by mouth daily.   Yes [provider]  metoprolol succinate (TOPROL-XL) 25 MG 24 hr tablet Take 75 mg by mouth daily with breakfast. Take with or immediately following a meal.    Yes [provider]  mirtazapine (REMERON) 7.5 MG tablet Take 7.5 mg by mouth every other day.   Yes [provider]  polyethylene glycol (MIRALAX / GLYCOLAX) packet Take 17 g by mouth daily. Patient taking differently: Take 17 g by mouth daily as needed for mild constipation.  09/22/13  Yes Lacy Duverney M, PA-C  Probiotic Product (PROBIOTIC PO) Take 1 capsule by mouth daily.   Yes [provider]  promethazine (PHENERGAN) 25 MG tablet Take 25 mg by mouth every 6 (six) hours as needed for nausea or vomiting.   Yes [provider]  ranitidine (ZANTAC) 150 MG tablet Take 150 mg by mouth daily with supper.    Yes [provider]  zinc gluconate 50 MG tablet Take 50 mg by mouth daily with breakfast.   Yes [provider]    Medications: . chlorhexidine  15 mL Mouth Rinse BID  . feeding supplement (VITAL HIGH PROTEIN)  1,000 mL Per Tube Q24H  . insulin aspart  0-15 Units Subcutaneous Q4H  . mouth rinse  15 mL Mouth Rinse q12n4p  . pantoprazole sodium  40 mg Per Tube Daily   . sodium chloride 10 mL/hr at 08/20/17 0600  . anidulafungin 100 mg (08/19/17 1232)  . phenylephrine (NEO-SYNEPHRINE) Adult infusion Stopped (08/19/17 1142)  . piperacillin-tazobactam (ZOSYN)  IV 3.375 g (08/20/17 0152)  . potassium chloride 10 mEq (08/20/17 0803)  NaHCo3 drip discontinued this AM - discussed with Dr.  Vaughan Browner  Anti-infectives (From admission, onward)   Start      Dose/Rate Route Frequency Ordered Stop   08/18/17 1200  anidulafungin (ERAXIS) 100 mg in sodium chloride 0.9 % 100 mL IVPB     100 mg 78 mL/hr over 100 Minutes Intravenous Every 24 hours 08/18/17 0907 08/20/17 2359   08/18/17 1000  piperacillin-tazobactam (ZOSYN) IVPB 3.375 g     3.375 g 12.5 mL/hr over 240 Minutes Intravenous Every 8 hours 08/18/17 0907 08/20/17 2359   08/13/17 1100  anidulafungin (ERAXIS) 100 mg in sodium chloride 0.9 % 100 mL IVPB  Status:  Discontinued     100 mg 78 mL/hr over 100 Minutes Intravenous Every 24 hours 08/12/17 1048 08/17/17 1308   08/12/17 1100  anidulafungin (ERAXIS)  200 mg in sodium chloride 0.9 % 200 mL IVPB     200 mg 78 mL/hr over 200 Minutes Intravenous  Once 08/12/17 1048 08/12/17 1648   08/11/17 0600  piperacillin-tazobactam (ZOSYN) IVPB 3.375 g     3.375 g 12.5 mL/hr over 240 Minutes Intravenous Every 8 hours 08/11/17 0025 08/17/17 2359   08/11/17 0115  vancomycin (VANCOCIN) IVPB 750 mg/150 ml premix     750 mg 150 mL/hr over 60 Minutes Intravenous Every 24 hours 08/11/17 0110 08/17/17 0211   08/11/17 0045  piperacillin-tazobactam (ZOSYN) IVPB 3.375 g     3.375 g 100 mL/hr over 30 Minutes Intravenous  Once 08/11/17 0033 08/11/17 0206   08/20/2017 2000  piperacillin-tazobactam (ZOSYN) IVPB 3.375 g  Status:  Discontinued     3.375 g 100 mL/hr over 30 Minutes Intravenous  Once 08/13/2017 1952 08/11/17 0025     Assessment/Plan Perforated proximal descending colon Extended left hemicolectomy with 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  POD 9, -TFs atgoal rate and tolerating well. -BID midline dressing changesand prn for saturation from serous drainage -WOC RN for ostomy care -cont zosyn and vanc, Eraxis added 7/18- stopped 7/23 -WBCnormal  Sepsis -secondary to #1 -patient off pressors -cont abx therapy - discontinue 7/26  Thrombocytopenia -plts down to 45K today -heparin held, HIT antibodies  being checked, still pending  VDRF -may wean to extubate per CCM -doing some spontaneous breathing. Trying to wean fentanyl to see if she can be extubated  - she has been tracking some and trying to follow direction last 2 days - extubated 6:30 PM 7/25 - one way extubation per family  DM -SSI for now  HTN -Hypotension resolved and off pressors since 0700 yesterday  - No fentanyl since 0700 yesterday  - BB on hold   Acute Renal Failure -Creatinine1.63. [id number] yesterday  Fluid overload - severely edematous all over - We do not know what her EF is  - pre op on BB - held for hypotension - 1 episode of AF resolved since surgery  Hypokalemia - getting some replacement IV now, Mag 1.5; will give additional Mag.   Severe malnutrition - tube feeds/Speech evaluation for swallowing   FEN -NPO, IVFs, TFs at50cc/hrwhich is goal,  VTE - SCDs - Heparin held for low platelets ID -Zosyn 7/16-->Vanc 7/16-->Eraxis 7/18 -->all abx stopped 08/17/17.   (Calcitonin 32) Zosyn/Eraxis  restarted 7/24 =>> day 10 to be completed today 7/26  Foley: In    Plan:  CCM is signing off and will ask Medicine to assist with care.  Replacing K+. I have ask for a Speech eval for swallowing, but I don't think she can swallow with current mentation.  Will await Palliative input, and continue acute care.         LOS: 10 days    Shlomie Romig 08/20/2017 [phone]

## 2017-08-20 NOTE — Progress Notes (Addendum)
CRITICAL VALUE ALERT  Critical Value:  K 2.6   Cal 6.2  Date & Time Notied:  7/26 1352   Provider Notified: Earnstine Regal  Orders Received/Actions taken:  Waiting for page to be returned.

## 2017-08-20 NOTE — Progress Notes (Addendum)
Nutrition Follow-up  DOCUMENTATION CODES:   Obesity unspecified  INTERVENTION:   To better meet re-estimated needs, change TF to Vital AF 1.2 at 60 ml/h to provide 1728 kcal, 108 gm protein, 1168 ml free water daily  NUTRITION DIAGNOSIS:   Inadequate oral intake related to inability to eat as evidenced by NPO status.  Ongoing  GOAL:   Patient will meet greater than or equal to 90% of their needs  Being addressed with TF  MONITOR:   Labs, I & O's, Skin, TF tolerance  ASSESSMENT:   77 yo female with PMH of DM, HLD, asthma, GERD, Charcot's joint, R BKA, HLD, and HTN who was admitted on 7/16 with septic shock and pneumoperitoneum; found to have perforated proximal descending colon. S/P extended L hemicolectomy with distal transverse colostomy and mucous fistula creation on 7/17.   Discussed patient with RN today. Code status is DNR; S/P one way extubation 7/25. Palliative Care team following. NGT in place; currently receiving Vital High Protein at 50 ml/h (1200 ml/day) to provide 1200 kcals, 105 gm protein, 1003 ml free water daily.  Plans for swallow evaluation with SLP soon.  Labs reviewed. Potassium 2.8 (L), magnesium 1.5 (L) CBG's: [phone] Medications reviewed.  Diet Order:   Diet Order           Diet NPO time specified  Diet effective now          EDUCATION NEEDS:   No education needs have been identified at this time  Skin:  Skin Assessment: Skin Integrity Issues: Skin Integrity Issues:: Diabetic Ulcer, Incisions Diabetic Ulcer: L foot Incisions: abdomen  Last BM:  colostomy; 925+ ml 7/25  Height:   Ht Readings from Last 1 Encounters:  08/11/17 5' (1.524 m)    Weight:   Wt Readings from Last 1 Encounters:  08/20/17 200 lb 9.9 oz (91 kg)    Ideal Body Weight:  42.5 kg  BMI:  Body mass index is 39.18 kg/m.  Estimated Nutritional Needs:   Kcal:  1550-1750  Protein:  90-110 gm  Fluid:  1.6-1.8 L    Molli Barrows, RD, LDN,  [place] Pager [phone] After Hours Pager [phone]

## 2017-08-20 NOTE — Progress Notes (Signed)
eLink Physician-Brief Progress Note Patient Name: Judith [name] DOB: [birth year] MRN: [id number]   Date of Service  08/20/2017  HPI/Events of Note  Hypokalemia  eICU Interventions  KCL 10 meq iv Q 1 hr x 3        Okoronkwo U Ogan 08/20/2017, 6:25 AM

## 2017-08-20 NOTE — Evaluation (Signed)
Clinical/Bedside Swallow Evaluation Patient Details  Name: Judith [name] MRN: [id number] Date of Birth: 1940-10-15  Today's Date: 08/20/2017 Time: SLP Start Time (ACUTE ONLY): 1340 SLP Stop Time (ACUTE ONLY): 1351 SLP Time Calculation (min) (ACUTE ONLY): 11 min  Past Medical History:  Past Medical History:  Diagnosis Date  . Anxiety   . Arthritis    "left leg" (05/17/2013)  . Asthma   . Charcot's joint of foot due to diabetes ([place])   . Closed right ankle fracture   . Colitis 09/06/02  . Diabetes mellitus without complication ([place])   . Fracture of femur, distal, right, closed ([place]) 03/01/2012  . GERD (gastroesophageal reflux disease)   . UTMLYYTK(354.6)    "probably weekly" (05/17/2013)  . Hemorrhoid   . High cholesterol   . History of stomach ulcers   . Hyperlipidemia   . Hypertension   . Insulin dependent diabetes mellitus ([place]) 03/01/2012  . Obesity (BMI 30-39.9) 03/03/2012  . Perforated viscus 07/26/2017  . Pneumonia    "once" (05/17/2013)  . Proctocolitis 09/04/02   Past Surgical History:  Past Surgical History:  Procedure Laterality Date  . ABDOMINAL HYSTERECTOMY    . BELOW KNEE LEG AMPUTATION Right 02/2008  . BREAST LUMPECTOMY Bilateral    "not cancer"  . CATARACT EXTRACTION W/ INTRAOCULAR LENS  IMPLANT, BILATERAL Bilateral   . LAPAROTOMY N/A 08/05/2017   Procedure: EXPLORATORY LAPAROTOMY FOR FREE AIR, LEFT EXTENDED HEMICOLECTOMY, COLOSTOMY AND MUCUS FISTULA;  Surgeon: Clovis Riley, MD;  Location: [facility];  Service: General;  Laterality: N/A;  . ORIF FEMUR FRACTURE  03/01/2012   Procedure: OPEN REDUCTION INTERNAL FIXATION (ORIF) DISTAL FEMUR FRACTURE;  Surgeon: Rozanna Box, MD;  Location: [facility];  Service: Orthopedics;  Laterality: Right;  ORIF right femur   HPI:  77 y.o. female  with past medical history of IDDM, charcot's joint, dementia, anxiety, proctocolitis, PUD, right BKA, asthma, and arthritis who was admitted on 08/25/2017 with septic shock.  She was found to have a  bowel perforation and was taken to the OR.  She received a hemicolectomy with colostomy and mucous fistula. Intubated 08/11/17 with one-way extubation on 08/19/17. Per palliative, prognosis <2 weeks in setting of hypotensive with Afib, severely deconditioned, malnourished, demented, with a large abdominal wound. No tracheostomy, no PEG.    Assessment / Plan / Recommendation Clinical Impression   Patient presents with severe risk for aspiration given prolonged intubation, deconditioning, cognitive impairment and lethargy. Pt rouses with upright positioning, however she is weak with poor head control. Manipulates ice chip, with multiple swallows, wet vocal quality and delayed cough suggestive of decreased airway protection. No family present; would continue alternative means (cortrak may improve comfort vs NG) through the weekend as I do not anticipate she will be ready for objective testing. SLP will follow up to determine readiness for instrumental testing vs comfort recommendations if desired. Consider FEES or MBS. D/w RN.    SLP Visit Diagnosis: Dysphagia, unspecified (R13.10)    Aspiration Risk  Severe aspiration risk    Diet Recommendation NPO;Alternative means - temporary   Medication Administration: Via alternative means    Other  Recommendations Oral Care Recommendations: Oral care QID   Follow up Recommendations Other (comment)(tbd)      Frequency and Duration min 2x/week  2 weeks       Prognosis Prognosis for Safe Diet Advancement: Guarded Barriers to Reach Goals: Other (Comment)(prognosis, comorbidities, deconditioning)      Swallow Study   General Date of Onset: 08/16/2017  HPI: 77 y.o. female  with past medical history of IDDM, charcot's joint, dementia, anxiety, proctocolitis, PUD, right BKA, asthma, and arthritis who was admitted on 08/14/2017 with septic shock.  She was found to have a bowel perforation and was taken to the OR.  She received a hemicolectomy with colostomy  and mucous fistula. Intubated 08/11/17 with one-way extubation on 08/19/17. Per palliative, prognosis <2 weeks in setting of hypotensive with Afib, severely deconditioned, malnourished, demented, with a large abdominal wound. No tracheostomy, no PEG.  Type of Study: Bedside Swallow Evaluation Previous Swallow Assessment: none in chart Diet Prior to this Study: NPO;NG Tube Temperature Spikes Noted: No Respiratory Status: Nasal cannula History of Recent Intubation: Yes Length of Intubations (days): 7 days Date extubated: 08/19/17 Behavior/Cognition: Lethargic/Drowsy;Requires cueing Oral Cavity Assessment: Dry Oral Care Completed by SLP: Yes Oral Cavity - Dentition: Edentulous Self-Feeding Abilities: Total assist Patient Positioning: Upright in bed Baseline Vocal Quality: Low vocal intensity;Hoarse(weak) Volitional Cough: Weak Volitional Swallow: Unable to elicit    Oral/Motor/Sensory Function Overall Oral Motor/Sensory Function: Generalized oral weakness   Ice Chips Ice chips: Impaired Presentation: Spoon Pharyngeal Phase Impairments: Multiple swallows;Wet Vocal Quality;Cough - Delayed   Thin Liquid Thin Liquid: Not tested    Nectar Thick Nectar Thick Liquid: Not tested   Honey Thick Honey Thick Liquid: Not tested   Puree Puree: Not tested   Solid     Solid: Not tested     Deneise Lever, MS, CCC-SLP Speech-Language Pathologist [phone]  Aliene Altes 08/20/2017,1:54 PM

## 2017-08-20 NOTE — Progress Notes (Signed)
Chaplain was called to the PT bedside at family request for a Chaplain.  Per the staff Chaplain family was doing a one way extubation and wanted to have prayer.  Many members of the family were present as the Pt was extubated and tolerated it well.  Family pastor was present along with several church ministers.  There was a shout of joy after the procedure from the entire family.

## 2017-08-21 ENCOUNTER — Encounter (HOSPITAL_COMMUNITY): Payer: Self-pay | Admitting: *Deleted

## 2017-08-21 DIAGNOSIS — I480 Paroxysmal atrial fibrillation: Secondary | ICD-10-CM

## 2017-08-21 LAB — CBC
HCT: 26.3 % — ABNORMAL LOW (ref 36.0–46.0)
Hemoglobin: 8.7 g/dL — ABNORMAL LOW (ref 12.0–15.0)
MCH: 26.9 pg (ref 26.0–34.0)
MCHC: 33.1 g/dL (ref 30.0–36.0)
MCV: 81.4 fL (ref 78.0–100.0)
PLATELETS: 116 10*3/uL — AB (ref 150–400)
RBC: 3.23 MIL/uL — ABNORMAL LOW (ref 3.87–5.11)
RDW: 19.9 % — AB (ref 11.5–15.5)
WBC: 7.8 10*3/uL (ref 4.0–10.5)

## 2017-08-21 LAB — GLUCOSE, CAPILLARY
GLUCOSE-CAPILLARY: 157 mg/dL — AB (ref 70–99)
GLUCOSE-CAPILLARY: 173 mg/dL — AB (ref 70–99)
GLUCOSE-CAPILLARY: 175 mg/dL — AB (ref 70–99)
GLUCOSE-CAPILLARY: 179 mg/dL — AB (ref 70–99)
GLUCOSE-CAPILLARY: 195 mg/dL — AB (ref 70–99)
Glucose-Capillary: 192 mg/dL — ABNORMAL HIGH (ref 70–99)
Glucose-Capillary: 140 mg/dL — ABNORMAL HIGH (ref 70–99)

## 2017-08-21 LAB — BASIC METABOLIC PANEL
Anion gap: 11 (ref 5–15)
BUN: 49 mg/dL — AB (ref 8–23)
CHLORIDE: 115 mmol/L — AB (ref 98–111)
CO2: 18 mmol/L — AB (ref 22–32)
CREATININE: 1.6 mg/dL — AB (ref 0.44–1.00)
Calcium: 7.3 mg/dL — ABNORMAL LOW (ref 8.9–10.3)
GFR calc Af Amer: 34 mL/min — ABNORMAL LOW (ref >60)
GFR calc non Af Amer: 30 mL/min — ABNORMAL LOW (ref >60)
GLUCOSE: 223 mg/dL — AB (ref 70–99)
POTASSIUM: 3.3 mmol/L — AB (ref 3.5–5.1)
Sodium: 144 mmol/L (ref 135–145)

## 2017-08-21 LAB — MAGNESIUM: MAGNESIUM: 1.8 mg/dL (ref 1.7–2.4)

## 2017-08-21 LAB — PREALBUMIN: PREALBUMIN: 6.9 mg/dL — AB (ref 18–38)

## 2017-08-21 MED ORDER — POTASSIUM CHLORIDE 10 MEQ/50ML IV SOLN
10.0000 meq | INTRAVENOUS | Status: AC
  Administered 2017-08-21 (×3): 10 meq via INTRAVENOUS
  Filled 2017-08-21 (×3): qty 50

## 2017-08-21 NOTE — Progress Notes (Signed)
  Speech Language Pathology Treatment: Dysphagia  Patient Details Name: MATTISYN CARDONA MRN: [id number] DOB: 06-18-1940 Today's Date: 08/21/2017 Time: 8546-2703 SLP Time Calculation (min) (ACUTE ONLY): 12 min  Assessment / Plan / Recommendation Clinical Impression  Pt will respond to name calling consistently today, either verbally or with eye opening. She is intermittently following simple, one-step commands. Anterior spillage is noted on the R of her saliva at baseline. SLP administered trials of ice chips which elicit multiple swallows, wet vocal quality, and delayed coughing. Her cough is not strong enough to be productive despite Mod cues from SLP. Oral suction was attempted but with minimal return. Recommend to remain NPO with consideration for cortrak over larger NGT for pt comfort.   HPI HPI: 77 y.o. female  with past medical history of IDDM, charcot's joint, dementia, anxiety, proctocolitis, PUD, right BKA, asthma, and arthritis who was admitted on 08/17/2017 with septic shock.  She was found to have a bowel perforation and was taken to the OR.  She received a hemicolectomy with colostomy and mucous fistula. Intubated 08/11/17 with one-way extubation on 08/19/17. Per palliative, prognosis <2 weeks in setting of hypotensive with Afib, severely deconditioned, malnourished, demented, with a large abdominal wound. No tracheostomy, no PEG.       SLP Plan  Continue with current plan of care       Recommendations  Diet recommendations: NPO Medication Administration: Via alternative means                Oral Care Recommendations: Oral care QID Follow up Recommendations: Other (comment)(tba) SLP Visit Diagnosis: Dysphagia, unspecified (R13.10) Plan: Continue with current plan of care       GO                Germain Osgood 08/21/2017, 9:11 AM  Germain Osgood, M.A. CCC-SLP [phone]

## 2017-08-21 NOTE — Progress Notes (Signed)
180 ml fentanyl wasted down the sink witnessed by Antonietta Breach RN

## 2017-08-21 NOTE — Progress Notes (Signed)
11 Days Post-Op   Subjective/Chief Complaint: No acute changes overnight Tolerating extubation Tolerating tube feeds   Objective: Vital signs in last 24 hours: Temp:  [96.5 F (35.8 C)-99.4 F (37.4 C)] 97.5 F (36.4 C) (07/27 0355) Pulse Rate:  [90-105] 93 (07/27 0600) Resp:  [17-27] 20 (07/27 0600) BP: (96-140)/(47-93) 119/57 (07/27 0600) SpO2:  [97 %-100 %] 100 % (07/27 0600) Arterial Line BP: (108-159)/(34-85) 127/44 (07/27 0600) Last BM Date: 08/20/17  Intake/Output from previous day: 07/26 0701 - 07/27 0700 In: 1542.1 [NG/GT:970; IV Piggyback:572.1] Out: 2140 [Urine:900; Stool:1240] Intake/Output this shift: No intake/output data recorded.  Exam: Lungs clear CV RRR Abdomen soft, open wound clean, ostomy productive, mucus fistula without bleeding  Lab Results:  Recent Labs    08/20/17 0506 08/21/17 0453  WBC 7.5 7.8  HGB 8.7* 8.7*  HCT 26.8* 26.3*  PLT 73* 116*   BMET Recent Labs    08/20/17 0920 08/21/17 0453  NA 142 144  K 2.6* 3.3*  CL 114* 115*  CO2 20* 18*  GLUCOSE 148* 223*  BUN 42* 49*  CREATININE 1.41* 1.60*  CALCIUM 6.2* 7.3*   PT/INR No results for input(s): LABPROT, INR in the last 72 hours. ABG No results for input(s): PHART, HCO3 in the last 72 hours.  Invalid input(s): PCO2, PO2  Studies/Results: No results found.  Anti-infectives: Anti-infectives (From admission, onward)   Start     Dose/Rate Route Frequency Ordered Stop   08/18/17 1200  anidulafungin (ERAXIS) 100 mg in sodium chloride 0.9 % 100 mL IVPB     100 mg 78 mL/hr over 100 Minutes Intravenous Every 24 hours 08/18/17 0907 08/20/17 1432   08/18/17 1000  piperacillin-tazobactam (ZOSYN) IVPB 3.375 g     3.375 g 12.5 mL/hr over 240 Minutes Intravenous Every 8 hours 08/18/17 0907 08/20/17 2250   08/13/17 1100  anidulafungin (ERAXIS) 100 mg in sodium chloride 0.9 % 100 mL IVPB  Status:  Discontinued     100 mg 78 mL/hr over 100 Minutes Intravenous Every 24 hours  08/12/17 1048 08/17/17 1308   08/12/17 1100  anidulafungin (ERAXIS) 200 mg in sodium chloride 0.9 % 200 mL IVPB     200 mg 78 mL/hr over 200 Minutes Intravenous  Once 08/12/17 1048 08/12/17 1648   08/11/17 0600  piperacillin-tazobactam (ZOSYN) IVPB 3.375 g     3.375 g 12.5 mL/hr over 240 Minutes Intravenous Every 8 hours 08/11/17 0025 08/17/17 2359   08/11/17 0115  vancomycin (VANCOCIN) IVPB 750 mg/150 ml premix     750 mg 150 mL/hr over 60 Minutes Intravenous Every 24 hours 08/11/17 0110 08/17/17 0211   08/11/17 0045  piperacillin-tazobactam (ZOSYN) IVPB 3.375 g     3.375 g 100 mL/hr over 30 Minutes Intravenous  Once 08/11/17 0033 08/11/17 0206   08/19/2017 2000  piperacillin-tazobactam (ZOSYN) IVPB 3.375 g  Status:  Discontinued     3.375 g 100 mL/hr over 30 Minutes Intravenous  Once 08/24/2017 1952 08/11/17 0025      Assessment/Plan: s/p Procedure(s): EXPLORATORY LAPAROTOMY FOR FREE AIR, LEFT EXTENDED HEMICOLECTOMY, COLOSTOMY AND MUCUS FISTULA (N/A)  Continue wound care and tube feeds Hypokalemia.  Replace potassium Platelets up Hgb stable    LOS: 11 days    Elio Haden A 08/21/2017

## 2017-08-21 NOTE — Progress Notes (Signed)
PROGRESS NOTE    Judith [name]  NFA:[id number] DOB: Jun 13, 1940 DOA: 08/01/2017 PCP: System, Pcp Not In    Brief Narrative:  Judith [name] is a 77 y.o. female , present to [facility] ED on 7/16 for altered mental status, she was found to be in septic shock from perforated proximal descending colon and pneumoperitoneum. She underwent extensive left hemicolectomy with mobilization of the splenic flexure, end colostomy and mucous fistula by surgery. Post op she required continual intubation and vasopressor use and was on PCCM service. Currently she is extubated and off pressors and medical service consulted for management of medical issues.    Assessment & Plan:   Principal Problem:   Perforation of colon s/p left colectomy/ostomy/mucus fistula 08/11/2017 Active Problems:   Acute respiratory failure with hypoxia (HCC)   S/P exploratory laparotomy   Septic shock (HCC)   Acute respiratory failure (HCC)   Atrial fibrillation (HCC)   Goals of care, counseling/discussion   DNR (do not resuscitate)    SIGNIFICANT EVENTS: 7/16 > admit, to OR for ex-lap. 7/17 > remains intubated after hemicolectomy and colostomy placement 7/18 > worsening labs overnight, poor UOP, HCO3 infusion started 7/19 > improved ABG, improved lactic acid 7/20 > atrial fibrillation with rate into 130s, norepinephrine changed to phenylephrine  7/21 > fentanyl weaned down, vasopressin stopped, intermittently in atrial fibrillation. Potassium replaced overnight, intermittent a fib with drop in pressure during these episodes, responded well to Lopressor 5 mg 7/22 > decreased fentanyl from 50 mcg to 25 mcg with no improvement in neurological status, palliative care consult placed 7/23 > antibiotics course completed 7/24 > GOC meeting with family, changed status to DNR, would like patient extubated whenever CCM thinks it will have best chance for success 7/25 > Extubated successfully, no longer on pressors or sedation. 7/27 > medical  service consulted for medical issues.    ANTIBIOTICS: Vanc 7/17 > 7/23 Zosyn 7/17 > 7/26 Eraxis 7/18 > 7/26  Acute respiratory failure with hypoxia post op requiring ETT from 7/16 till 7/25.  Currently on 4 lit of [place] oxygen with good sats.  Bronchodilators as needed.    Asthma:  No wheezing heard.    Septic shock from peritonitis from  perforated descending colon and pneumoperitoneum: Appears to have resolved. She is off pressors and normotensive.  Completed the course of zosyn    H/o Paroxysmal atrial fibrillation;  Rate controlled. Prn lopressor for now.    Acute encephalopathy:  From septic shock, metabolic encephalopathy.  Somnolent. Palliative care consulted for [place] and is on board.    Hypokalemia: replaced , repeat in am.     Hypertension; well controlled.    Stage 4 CKD Creatinine at baseline.     Anemia of chronic disease/ anemia of blood loss from surgery.  - baseline hemoglobin between 9 to 10.  - currently at 8.7 and stable.  - transfuse to keep hemoglobin greater than 7.    Thrombocytopenia:  Suspect from the illness.  Improving     Diabetes mellitus:  CBG (last 3)  Recent Labs    08/21/17 0333 08/21/17 0905 08/21/17 1201  GLUCAP 195* 179* 192*   On SSI.  A1c ordered.      DVT prophylaxis: scd's Code Status: DNR  Family Communication:none at bedside today.  Disposition Plan: PENDING clinical improvement, and palliative care   Procedures: Hemicolectomy with end colostomy.    Antimicrobials: completed zosyn.    Subjective: Somnolent.   Objective: Vitals:   08/21/17 0500 08/21/17 0600  08/21/17 0700 08/21/17 0916  BP: (!) 135/93 (!) 119/57 125/70   Pulse: (!) 101 93 (!) 102   Resp: (!) 21 20 16    Temp:    97.6 F (36.4 C)  TempSrc:    Oral  SpO2: 97% 100% 100%   Weight:      Height:        Intake/Output Summary (Last 24 hours) at 08/21/2017 1204 Last data filed at 08/21/2017 1113 Gross per 24 hour  Intake  1342.52 ml  Output 2815 ml  Net -1472.48 ml   Filed Weights   08/18/17 0416 08/19/17 0500 08/20/17 0416  Weight: 92.6 kg (204 lb 2.3 oz) 91.1 kg (200 lb 13.4 oz) 91 kg (200 lb 9.9 oz)    Examination:  General exam: not in distress, on 4 lit of [place] oxygen and NG Tube.  Respiratory system: Clear to auscultation. Respiratory effort normal. Cardiovascular system: S1 & S2 heard, RRR.  Gastrointestinal system: abdomen is soft, ostomy in place, abdominal wound pink.  Central nervous system: not oriented, detailed exam couldn't be performed. Somnolent.  Extremities: RIGHT BKA, leg edema 2+ Skin: No rashes, lesions or ulcers Psychiatry: does not appear to be agitated.     Data Reviewed: I have personally reviewed following labs and imaging studies  CBC: Recent Labs  Lab 08/15/17 0610 08/16/17 0257 08/17/17 0423 08/20/17 0506 08/21/17 0453  WBC 13.3* 11.8* 10.9* 7.5 7.8  HGB 9.6* 9.6* 9.7* 8.7* 8.7*  HCT 30.3* 29.1* 29.2* 26.8* 26.3*  MCV 85.6 81.7 79.8 82.2 81.4  PLT 54* 50* 45* 73* 740*   Basic Metabolic Panel: Recent Labs  Lab 08/14/17 1650  08/16/17 0257 08/17/17 0423 08/17/17 2022 08/18/17 0412 08/20/17 0506 08/20/17 0920 08/21/17 0453  NA  --    < > 141 139 140  --  144 142 144  K  --    < > 2.9* 2.7* 3.3*  --  2.8* 2.6* 3.3*  CL  --    < > 114* 116* 117*  --  113* 114* 115*  CO2  --    < > 15* 13* 13*  --  22 20* 18*  GLUCOSE  --    < > 184* 217* 138*  --  172* 148* 223*  BUN  --    < > 40* 43* 47*  --  51* 42* 49*  CREATININE  --    < > 1.96* 1.80* 1.77*  --  1.63* 1.41* 1.60*  CALCIUM  --    < > 6.6* 6.7* 6.8*  --  7.0* 6.2* 7.3*  MG 1.7  --  1.8  --   --  1.6* 1.5*  --  1.8  PHOS 5.1*  --   --   --   --  2.8  --   --   --    < > = values in this interval not displayed.   GFR: Estimated Creatinine Clearance: 29.1 mL/min (A) (by C-G formula based on SCr of 1.6 mg/dL (H)). Liver Function Tests: Recent Labs  Lab 08/20/17 0920  AST 20  ALT 11  ALKPHOS  123  BILITOT 0.9  PROT 3.6*  ALBUMIN <1.0*   No results for input(s): LIPASE, AMYLASE in the last 168 hours. No results for input(s): AMMONIA in the last 168 hours. Coagulation Profile: No results for input(s): INR, PROTIME in the last 168 hours. Cardiac Enzymes: No results for input(s): CKTOTAL, CKMB, CKMBINDEX, TROPONINI in the last 168 hours. BNP (last 3 results) No results  for input(s): PROBNP in the last 8760 hours. HbA1C: No results for input(s): HGBA1C in the last 72 hours. CBG: Recent Labs  Lab 08/20/17 2342 08/21/17 0107 08/21/17 0333 08/21/17 0905 08/21/17 1201  GLUCAP 151* 173* 195* 179* 192*   Lipid Profile: No results for input(s): CHOL, HDL, LDLCALC, TRIG, CHOLHDL, LDLDIRECT in the last 72 hours. Thyroid Function Tests: No results for input(s): TSH, T4TOTAL, FREET4, T3FREE, THYROIDAB in the last 72 hours. Anemia Panel: No results for input(s): VITAMINB12, FOLATE, FERRITIN, TIBC, IRON, RETICCTPCT in the last 72 hours. Sepsis Labs: Recent Labs  Lab 08/14/17 1454 08/18/17 0941  PROCALCITON  --  32.01  LATICACIDVEN 2.0*  --     Recent Results (from the past 240 hour(s))  Culture, respiratory (NON-Expectorated)     Status: None   Collection Time: 08/11/17  6:08 PM  Result Value Ref Range Status   Specimen Description TRACHEAL ASPIRATE  Final   Special Requests NONE  Final   Gram Stain   Final    MODERATE WBC PRESENT, PREDOMINANTLY MONONUCLEAR NO ORGANISMS SEEN    Culture   Final    RARE Consistent with normal respiratory flora. Performed at [facility], [place] [address] [place], [place] [address]    Report Status 08/14/2017 FINAL  Final         Radiology Studies: No results found.      Scheduled Meds: . chlorhexidine  15 mL Mouth Rinse BID  . insulin aspart  0-15 Units Subcutaneous Q4H  . mouth rinse  15 mL Mouth Rinse q12n4p  . pantoprazole sodium  40 mg Per Tube Daily   Continuous Infusions: . sodium chloride Stopped  (08/20/17 5726)  . feeding supplement (VITAL AF 1.2 CAL) 60 mL/hr at 08/20/17 2300  . phenylephrine (NEO-SYNEPHRINE) Adult infusion Stopped (08/19/17 1142)     LOS: 11 days    Time spent: 35 minutes.     Hosie Poisson, MD [facility] Pager [phone]   If 7PM-7AM, please contact night-coverage [email or web address] Password [facility] 08/21/2017, 12:04 PM

## 2017-08-22 DIAGNOSIS — E43 Unspecified severe protein-calorie malnutrition: Secondary | ICD-10-CM

## 2017-08-22 DIAGNOSIS — K668 Other specified disorders of peritoneum: Secondary | ICD-10-CM

## 2017-08-22 DIAGNOSIS — Z01818 Encounter for other preprocedural examination: Secondary | ICD-10-CM

## 2017-08-22 LAB — GLUCOSE, CAPILLARY
GLUCOSE-CAPILLARY: 187 mg/dL — AB (ref 70–99)
GLUCOSE-CAPILLARY: 248 mg/dL — AB (ref 70–99)
GLUCOSE-CAPILLARY: 227 mg/dL — AB (ref 70–99)
GLUCOSE-CAPILLARY: 228 mg/dL — AB (ref 70–99)
Glucose-Capillary: 199 mg/dL — ABNORMAL HIGH (ref 70–99)

## 2017-08-22 LAB — CBC
HEMATOCRIT: 26.6 % — AB (ref 36.0–46.0)
Hemoglobin: 8.4 g/dL — ABNORMAL LOW (ref 12.0–15.0)
MCH: 26.8 pg (ref 26.0–34.0)
MCHC: 31.6 g/dL (ref 30.0–36.0)
MCV: 84.7 fL (ref 78.0–100.0)
Platelets: 165 10*3/uL (ref 150–400)
RBC: 3.14 MIL/uL — ABNORMAL LOW (ref 3.87–5.11)
RDW: 20.6 % — ABNORMAL HIGH (ref 11.5–15.5)
WBC: 7.2 10*3/uL (ref 4.0–10.5)

## 2017-08-22 LAB — BASIC METABOLIC PANEL
Anion gap: 7 (ref 5–15)
BUN: 46 mg/dL — AB (ref 8–23)
CHLORIDE: 120 mmol/L — AB (ref 98–111)
CO2: 20 mmol/L — AB (ref 22–32)
Calcium: 7.3 mg/dL — ABNORMAL LOW (ref 8.9–10.3)
Creatinine, Ser: 1.57 mg/dL — ABNORMAL HIGH (ref 0.44–1.00)
GFR calc Af Amer: 35 mL/min — ABNORMAL LOW (ref >60)
GFR calc non Af Amer: 30 mL/min — ABNORMAL LOW (ref >60)
Glucose, Bld: 225 mg/dL — ABNORMAL HIGH (ref 70–99)
Potassium: 3.2 mmol/L — ABNORMAL LOW (ref 3.5–5.1)
Sodium: 147 mmol/L — ABNORMAL HIGH (ref 135–145)

## 2017-08-22 LAB — HEMOGLOBIN A1C
Hgb A1c MFr Bld: 8.1 % — ABNORMAL HIGH (ref 4.8–5.6)
Mean Plasma Glucose: 185.77 mg/dL

## 2017-08-22 MED ORDER — POTASSIUM CHLORIDE 10 MEQ/100ML IV SOLN
10.0000 meq | INTRAVENOUS | Status: AC
  Administered 2017-08-22 (×4): 10 meq via INTRAVENOUS
  Filled 2017-08-22 (×2): qty 100

## 2017-08-22 MED ORDER — MAGNESIUM SULFATE 2 GM/50ML IV SOLN
2.0000 g | Freq: Once | INTRAVENOUS | Status: AC
  Administered 2017-08-22: 2 g via INTRAVENOUS
  Filled 2017-08-22: qty 50

## 2017-08-22 NOTE — Progress Notes (Signed)
12 Days Post-Op   Subjective/Chief Complaint: No acute events.  Remains extubated and tolerating feeds.    Objective: Vital signs in last 24 hours: Temp:  [97.4 F (36.3 C)-98.7 F (37.1 C)] 98.7 F (37.1 C) (07/28 0401) Pulse Rate:  [90-113] 96 (07/28 0700) Resp:  [13-23] 20 (07/28 0700) BP: (88-145)/(51-77) 145/57 (07/28 0700) SpO2:  [98 %-100 %] 100 % (07/28 0700) Arterial Line BP: (72-135)/(41-89) 91/89 (07/27 2000) Last BM Date: 08/20/17  Intake/Output from previous day: 07/27 0701 - 07/28 0700 In: 1737.1 [I.V.:120; NG/GT:1440; IV Piggyback:177.1] Out: 4310 [Urine:1260; Stool:3050] Intake/Output this shift: No intake/output data recorded.  Exam: Alert.  Follows commands for nursing staff.  Did not follow commands for me.   CV RRR Abdomen soft, open wound clean, ostomy with gas and stool, mucus fistula without bleeding  Lab Results:  Recent Labs    08/21/17 0453 08/22/17 0410  WBC 7.8 7.2  HGB 8.7* 8.4*  HCT 26.3* 26.6*  PLT 116* 165   BMET Recent Labs    08/21/17 0453 08/22/17 0410  NA 144 147*  K 3.3* 3.2*  CL 115* 120*  CO2 18* 20*  GLUCOSE 223* 225*  BUN 49* 46*  CREATININE 1.60* 1.57*  CALCIUM 7.3* 7.3*   PT/INR No results for input(s): LABPROT, INR in the last 72 hours. ABG No results for input(s): PHART, HCO3 in the last 72 hours.  Invalid input(s): PCO2, PO2  Studies/Results: No results found.  Anti-infectives: Anti-infectives (From admission, onward)   Start     Dose/Rate Route Frequency Ordered Stop   08/18/17 1200  anidulafungin (ERAXIS) 100 mg in sodium chloride 0.9 % 100 mL IVPB     100 mg 78 mL/hr over 100 Minutes Intravenous Every 24 hours 08/18/17 0907 08/20/17 1432   08/18/17 1000  piperacillin-tazobactam (ZOSYN) IVPB 3.375 g     3.375 g 12.5 mL/hr over 240 Minutes Intravenous Every 8 hours 08/18/17 0907 08/20/17 2250   08/13/17 1100  anidulafungin (ERAXIS) 100 mg in sodium chloride 0.9 % 100 mL IVPB  Status:  Discontinued      100 mg 78 mL/hr over 100 Minutes Intravenous Every 24 hours 08/12/17 1048 08/17/17 1308   08/12/17 1100  anidulafungin (ERAXIS) 200 mg in sodium chloride 0.9 % 200 mL IVPB     200 mg 78 mL/hr over 200 Minutes Intravenous  Once 08/12/17 1048 08/12/17 1648   08/11/17 0600  piperacillin-tazobactam (ZOSYN) IVPB 3.375 g     3.375 g 12.5 mL/hr over 240 Minutes Intravenous Every 8 hours 08/11/17 0025 08/17/17 2359   08/11/17 0115  vancomycin (VANCOCIN) IVPB 750 mg/150 ml premix     750 mg 150 mL/hr over 60 Minutes Intravenous Every 24 hours 08/11/17 0110 08/17/17 0211   08/11/17 0045  piperacillin-tazobactam (ZOSYN) IVPB 3.375 g     3.375 g 100 mL/hr over 30 Minutes Intravenous  Once 08/11/17 0033 08/11/17 0206   08/09/2017 2000  piperacillin-tazobactam (ZOSYN) IVPB 3.375 g  Status:  Discontinued     3.375 g 100 mL/hr over 30 Minutes Intravenous  Once 07/26/2017 1952 08/11/17 0025      Assessment/Plan: s/p Procedure(s): EXPLORATORY LAPAROTOMY FOR FREE AIR, LEFT EXTENDED HEMICOLECTOMY, COLOSTOMY AND MUCUS FISTULA (N/A)  Continue tube feeds Continue dressing changes.    HCT stable.   LOS: 12 days    Stark Klein 08/22/2017

## 2017-08-22 NOTE — Progress Notes (Signed)
Daily Progress Note   Patient Name: Judith [name]       Date: 08/22/2017 DOB: 05-29-1940  Age: 77 y.o. MRN#: [id number] Attending Physician: Nolon Nations, MD Primary Care Physician: System, Pcp Not In Admit Date: 08/11/2017  Reason for Consultation/Follow-up: Establishing goals of care  Subjective: Patient is non-verbal.  Being fed with N/G tube.  Patient attempts to speak to me a few times but only has strength enough to release a little extra air from her mouth.  She is too weak for form a word.    RN notes that her potassium has remained low despite aggressive repletion, and 3 toes on her foot are becoming darkened.  I left a voice mail for the main family contact [name] updating her and asking for a call back.  Assessment: 77 yo female, extubated, off pressors, tachycardic, exceedingly weak, with severe malnutrition and edema.  Unable to swallow or form words due to weakness and likely cognitive changes.   Patient Profile/HPI:  77 y.o. female  with past medical history of IDDM, charcot's joint, dementia, anxiety, proctocolitis, PUD, right BKA, asthma, and arthritis who was admitted on 08/07/2017 with septic shock.  She was found to have a bowel perforation and was taken to the OR.  She received a hemicolectomy with colostomy and mucous fistula.  Her post op course has been a slow decline. She has weaned from pressors and the vent.  She is unable to eat and extremely weak.   Length of Stay: 12  Current Medications: Scheduled Meds:  . chlorhexidine  15 mL Mouth Rinse BID  . insulin aspart  0-15 Units Subcutaneous Q4H  . mouth rinse  15 mL Mouth Rinse q12n4p  . pantoprazole sodium  40 mg Per Tube Daily    Continuous Infusions: . sodium chloride Stopped (08/20/17 8850)  . feeding supplement  (VITAL AF 1.2 CAL) 1,000 mL (08/21/17 1942)  . phenylephrine (NEO-SYNEPHRINE) Adult infusion Stopped (08/19/17 1142)  . potassium chloride 10 mEq (08/22/17 1000)    PRN Meds: sodium chloride, albuterol, hydroxypropyl methylcellulose / hypromellose, midazolam, sodium chloride  Physical Exam        Well developed,  chronically ill appearing female,  Awake, NAD CV tachy, but regular Resp no increased work of breathing. Abdomen - bandaged.  Ostomy and fistula in place, wound not examined.  LLE BKA  Vital Signs: BP 96/66   Pulse (!) 116   Temp 98.8 F (37.1 C) (Axillary)   Resp (!) 26   Ht 5' (1.524 m)   Wt 91 kg (200 lb 9.9 oz)   SpO2 100%   BMI 39.18 kg/m  SpO2: SpO2: 100 % O2 Device: O2 Device: Nasal Cannula O2 Flow Rate: O2 Flow Rate (L/min): 3 L/min  Intake/output summary:   Intake/Output Summary (Last 24 hours) at 08/22/2017 1042 Last data filed at 08/22/2017 1000 Gross per 24 hour  Intake 1994.21 ml  Output 4310 ml  Net -2315.79 ml   LBM: Last BM Date: 08/20/17 Baseline Weight: Weight: 86.2 kg (190 lb 0.6 oz) Most recent weight: Weight: 91 kg (200 lb 9.9 oz)       Palliative Assessment/Data: 10%    Flowsheet Rows     Most Recent Value  Intake Tab  Referral Department  Hospitalist  Unit at Time of Referral  ICU  Palliative Care Primary Diagnosis  Other (Comment) [Perforated viscus]  Date Notified  08/16/17  Palliative Care Type  Return patient Palliative Care  Reason for referral  Clarify Goals of Care  Date of Admission  08/13/2017  Date first seen by Palliative Care  08/17/17  # of days Palliative referral response time  1 Day(s)  # of days IP prior to Palliative referral  6  Clinical Assessment  Psychosocial & Spiritual Assessment  Palliative Care Outcomes      Patient Active Problem List   Diagnosis Date Noted  . Atrial fibrillation ([place])   . Goals of care, counseling/discussion   . DNR (do not resuscitate)   . Acute respiratory failure ([place])     . Acute respiratory failure with hypoxia ([place])   . S/P exploratory laparotomy   . Septic shock ([place])   . Perforation of colon s/p left colectomy/ostomy/mucus fistula 08/11/2017 08/13/2017  . Encounter for hospice care discussion   . Palliative care encounter   . Acute UTI   . AKI (acute kidney injury) ([place])   . Osteomyelitis of left foot ([place])   . UTI (urinary tract infection) 04/23/2017  . Osteomyelitis due to type 2 diabetes mellitus ([place]) 04/23/2017  . Uterine hypertrophy 06/11/2015  . Kidney lesion, native, bilateral 06/11/2015  . Protein-calorie malnutrition, severe ([place]) 09/22/2013  . Dementia 09/21/2013  . Constipation 09/21/2013  . Rectal bleeding 09/21/2013  . Fecal impaction ([place]) 09/21/2013  . BRBPR (bright red blood per rectum) 09/21/2013  . Altered mental status 05/17/2013  . Altered mental state 05/17/2013  . Delirium due to another medical condition 02/18/2013  . Acute blood loss anemia 03/03/2012  . Hx of right BKA ([place]) 03/03/2012  . Fall 03/03/2012  . Obesity (BMI 30-39.9) 03/03/2012  . Poor nutrition 03/03/2012  . Fracture of femur, distal, right, closed ([place]) 03/01/2012  . Lower urinary tract infectious disease 03/01/2012  . Insulin dependent diabetes mellitus ([place]) 03/01/2012  . Hypertension     Palliative Care Plan    Recommendations/Plan:  PMT will continue to follow with you.    Will speak with family about consideration for [facility] as they have said in the past "No feeding tube".    Goals of Care and Additional Recommendations:  Limitations on Scope of Treatment: No Surgical Procedures, No Tracheostomy and DNR/DNI  Code Status:  DNR  Prognosis:  Pending goals of care.  The patient may have weeks to months if she is artificially supported.  She likely has days to weeks if  the goals shift to full comfort.  Discharge Planning:  To Be Determined  Care plan was discussed with bedside RN  Thank you for allowing the Palliative Medicine  Team to assist in the care of this patient.  Total time spent:  25 min.     Greater than 50%  of this time was spent counseling and coordinating care related to the above assessment and plan.  [name], PA-C Palliative Medicine  Please contact Palliative MedicineTeam phone at [phone] for questions and concerns between 7 am - 7 pm.   Please see AMION for individual provider pager numbers.

## 2017-08-22 NOTE — Progress Notes (Signed)
PROGRESS NOTE  Judith [name] RWE:[id number] DOB: 24-Jul-1940 DOA: 08/25/2017 PCP: System, Pcp Not In  HPI/Recap of past 24 hours: Judith [name] a 77 y.o.female with past medical history significant for hypertension, atrial fibrillation, left foot osteomyelitis, right below the knee amputation, who presented to [facility] ED on 08/09/2017 due to altered mental status. She was found to be in septic shock from perforated proximal descending colon and pneumoperitoneum. She underwent left hemicolectomy with mobilization of the splenic flexure, end colostomy and mucous fistula by surgery. Post op she required continual intubation and vasopressor use and was on PCCM service. Currently she is extubated and off pressors and medical service consulted for management of medical issues.   08/22/2017: Patient seen and examined at her bedside.  She is alert minimally follows commands and nonverbal.  Does not appear to be in acute distress.     Assessment/Plan: Principal Problem:   Perforation of colon s/p left colectomy/ostomy/mucus fistula 08/11/2017 Active Problems:   Acute respiratory failure with hypoxia (HCC)   S/P exploratory laparotomy   Septic shock (HCC)   Acute respiratory failure (HCC)   Atrial fibrillation (HCC)   Goals of care, counseling/discussion   DNR (do not resuscitate)   Intra-abdominal free air of unknown etiology   Severe protein-calorie malnutrition ([place])   Status post exploratory laparotomy for free air, left extended hemicolectomy, colostomy and mucous fistula General surgery managing  Acute metabolic encephalopathy most likely multifactorial secondary to acute illness versus others Nonverbal and minimally responds/follows commands Unclear what her baseline is Reorient as needed Neurochecks every 4 hours  Hypokalemia Potassium 3.2 Repleted with IV potassium chloride 40 mEq and in addition 2 g of IV magnesium once  Generalized weakness/severe physical debility Suspect  multifactorial secondary to acute illness versus hypokalemia Replete electrolytes as indicated Nutritional supplement via NG tube PT OT to assess once stable  AKI on CKD 3 Baseline creatinine appears to be 1.4 with GFR of 40 Creatinine improving 1.57 from 1.6 yesterday and 2.0 prior Avoid nephrotoxic agents/hypotension/dehydration Repeat BMP in the morning  Acute hypoxic respiratory failure postop requiring mechanical ventilation from 7/16 through 7/25 Continue O2 supplementation via [place] to maintain O2 saturation between 88 and 92% Continue bronchodilators as needed  Paroxysmal A. fib Rate controlled Continue IV Lopressor as needed  PVD status post right below the knee amputation PT to assess when stable  Thrombocytopenia, resolved Defer chemical DVT prophylaxis to general surgery  Goals of care Palliative care team following to establish goals of care DNR    Code Status: DNR  Family Communication: None at bedside  Disposition Plan: Home versus SNF in 3 to 4 days when hemodynamically stable.  SIGNIFICANT EVENTS: 7/16 > admit, to OR for ex-lap. 7/17 > remains intubated after hemicolectomy and colostomy placement 7/18 > worsening labs overnight, poor UOP, HCO3 infusion started 7/19 > improved ABG, improved lactic acid 7/20 >atrial fibrillation with rate into 130s, norepinephrine changed to phenylephrine  7/21 > fentanyl weaned down, vasopressin stopped, intermittently in atrial fibrillation. Potassium replaced overnight, intermittent a fib with drop in pressure during these episodes, responded well to Lopressor 5 mg 7/22 > decreased fentanyl from 50 mcg to 25 mcg with no improvement in neurological status, palliative care consult placed 7/23 > antibiotics course completed 7/24 > GOC meeting with family, changed status to DNR, would like patient extubated whenever CCM thinks it will have best chance for success 7/25 > Extubated successfully, no longer on pressors or  sedation. 7/27 > medical service consulted  for medical issues.    ANTIBIOTICS: Vanc 7/17 > 7/23 Zosyn 7/17 >7/26 Eraxis 7/18 >7/26    DVT prophylaxis:  SCDs Defer chemical DVT prophylaxis to general surgery   Objective: Vitals:   08/22/17 1000 08/22/17 1100 08/22/17 1200 08/22/17 1218  BP: 96/66 113/61 108/67   Pulse: (!) 116 (!) 116 (!) 117   Resp: (!) 26 (!) 26 (!) 23   Temp:    98.1 F (36.7 C)  TempSrc:      SpO2: 100% 100% 100%   Weight:      Height:        Intake/Output Summary (Last 24 hours) at 08/22/2017 1238 Last data filed at 08/22/2017 1200 Gross per 24 hour  Intake 1796.99 ml  Output 3460 ml  Net -1663.01 ml   Filed Weights   08/18/17 0416 08/19/17 0500 08/20/17 0416  Weight: 92.6 kg (204 lb 2.3 oz) 91.1 kg (200 lb 13.4 oz) 91 kg (200 lb 9.9 oz)    Exam:  . General: 77 y.o. year-old female well developed well nourished in no acute distress.  Alert but nonverbal and mentally altered.  NG tube in place. . Cardiovascular: Regular rate and rhythm with no rubs or gallops.  No thyromegaly or JVD noted.   Marland Kitchen Respiratory: Mild rales at bases with no wheezes.  Poor inspiratory effort. . Abdomen: Soft nontender nondistended with normal bowel sounds x4 quadrants. . Musculoskeletal: Right below the knee amputation.  Left foot in a boot. Marland Kitchen Psychiatry: Unable to assess mood due to altered mental status.   Data Reviewed: CBC: Recent Labs  Lab 08/16/17 0257 08/17/17 0423 08/20/17 0506 08/21/17 0453 08/22/17 0410  WBC 11.8* 10.9* 7.5 7.8 7.2  HGB 9.6* 9.7* 8.7* 8.7* 8.4*  HCT 29.1* 29.2* 26.8* 26.3* 26.6*  MCV 81.7 79.8 82.2 81.4 84.7  PLT 50* 45* 73* 116* 433   Basic Metabolic Panel: Recent Labs  Lab 08/16/17 0257  08/17/17 2022 08/18/17 0412 08/20/17 0506 08/20/17 0920 08/21/17 0453 08/22/17 0410  NA 141   < > 140  --  144 142 144 147*  K 2.9*   < > 3.3*  --  2.8* 2.6* 3.3* 3.2*  CL 114*   < > 117*  --  113* 114* 115* 120*  CO2 15*   < >  13*  --  22 20* 18* 20*  GLUCOSE 184*   < > 138*  --  172* 148* 223* 225*  BUN 40*   < > 47*  --  51* 42* 49* 46*  CREATININE 1.96*   < > 1.77*  --  1.63* 1.41* 1.60* 1.57*  CALCIUM 6.6*   < > 6.8*  --  7.0* 6.2* 7.3* 7.3*  MG 1.8  --   --  1.6* 1.5*  --  1.8  --   PHOS  --   --   --  2.8  --   --   --   --    < > = values in this interval not displayed.   GFR: Estimated Creatinine Clearance: 29.7 mL/min (A) (by C-G formula based on SCr of 1.57 mg/dL (H)). Liver Function Tests: Recent Labs  Lab 08/20/17 0920  AST 20  ALT 11  ALKPHOS 123  BILITOT 0.9  PROT 3.6*  ALBUMIN <1.0*   No results for input(s): LIPASE, AMYLASE in the last 168 hours. No results for input(s): AMMONIA in the last 168 hours. Coagulation Profile: No results for input(s): INR, PROTIME in the last 168 hours. Cardiac  Enzymes: No results for input(s): CKTOTAL, CKMB, CKMBINDEX, TROPONINI in the last 168 hours. BNP (last 3 results) No results for input(s): PROBNP in the last 8760 hours. HbA1C: Recent Labs    08/22/17 0410  HGBA1C 8.1*   CBG: Recent Labs  Lab 08/21/17 1932 08/21/17 2343 08/22/17 0334 08/22/17 0754 08/22/17 1215  GLUCAP 140* 175* 199* 187* 248*   Lipid Profile: No results for input(s): CHOL, HDL, LDLCALC, TRIG, CHOLHDL, LDLDIRECT in the last 72 hours. Thyroid Function Tests: No results for input(s): TSH, T4TOTAL, FREET4, T3FREE, THYROIDAB in the last 72 hours. Anemia Panel: No results for input(s): VITAMINB12, FOLATE, FERRITIN, TIBC, IRON, RETICCTPCT in the last 72 hours. Urine analysis:    Component Value Date/Time   COLORURINE AMBER (A) 08/11/2017 0521   APPEARANCEUR CLOUDY (A) 08/11/2017 0521   LABSPEC 1.019 08/11/2017 0521   PHURINE 5.0 08/11/2017 0521   GLUCOSEU NEGATIVE 08/11/2017 0521   HGBUR SMALL (A) 08/11/2017 0521   BILIRUBINUR SMALL (A) 08/11/2017 0521   KETONESUR NEGATIVE 08/11/2017 0521   PROTEINUR 30 (A) 08/11/2017 0521   UROBILINOGEN 0.2 10/17/2014 0849    NITRITE NEGATIVE 08/11/2017 0521   LEUKOCYTESUR LARGE (A) 08/11/2017 0521   Sepsis Labs: @LABRCNTIP (procalcitonin:4,lacticidven:4)  )No results found for this or any previous visit (from the past 240 hour(s)).    Studies: No results found.  Scheduled Meds: . chlorhexidine  15 mL Mouth Rinse BID  . insulin aspart  0-15 Units Subcutaneous Q4H  . mouth rinse  15 mL Mouth Rinse q12n4p  . pantoprazole sodium  40 mg Per Tube Daily    Continuous Infusions: . sodium chloride Stopped (08/20/17 1275)  . feeding supplement (VITAL AF 1.2 CAL) 1,000 mL (08/21/17 1942)  . phenylephrine (NEO-SYNEPHRINE) Adult infusion Stopped (08/19/17 1142)  . potassium chloride 10 mEq (08/22/17 1220)     LOS: 12 days     Kayleen Memos, MD [facility] Pager [phone]  If 7PM-7AM, please contact night-coverage [email or web address] Password [facility] 08/22/2017, 12:38 PM

## 2017-08-23 ENCOUNTER — Inpatient Hospital Stay (HOSPITAL_COMMUNITY): Payer: Medicare Other

## 2017-08-23 LAB — GLUCOSE, CAPILLARY
GLUCOSE-CAPILLARY: 128 mg/dL — AB (ref 70–99)
GLUCOSE-CAPILLARY: 256 mg/dL — AB (ref 70–99)
Glucose-Capillary: 200 mg/dL — ABNORMAL HIGH (ref 70–99)
Glucose-Capillary: 132 mg/dL — ABNORMAL HIGH (ref 70–99)
Glucose-Capillary: 138 mg/dL — ABNORMAL HIGH (ref 70–99)
Glucose-Capillary: 152 mg/dL — ABNORMAL HIGH (ref 70–99)
Glucose-Capillary: 155 mg/dL — ABNORMAL HIGH (ref 70–99)
Glucose-Capillary: 148 mg/dL — ABNORMAL HIGH (ref 70–99)

## 2017-08-23 LAB — BASIC METABOLIC PANEL
ANION GAP: 6 (ref 5–15)
Anion gap: 9 (ref 5–15)
BUN: 43 mg/dL — ABNORMAL HIGH (ref 8–23)
BUN: 43 mg/dL — ABNORMAL HIGH (ref 8–23)
CALCIUM: 7.7 mg/dL — AB (ref 8.9–10.3)
CALCIUM: 7.7 mg/dL — AB (ref 8.9–10.3)
CO2: 19 mmol/L — ABNORMAL LOW (ref 22–32)
CO2: 19 mmol/L — AB (ref 22–32)
Chloride: 123 mmol/L — ABNORMAL HIGH (ref 98–111)
Chloride: 128 mmol/L — ABNORMAL HIGH (ref 98–111)
Creatinine, Ser: 1.54 mg/dL — ABNORMAL HIGH (ref 0.44–1.00)
Creatinine, Ser: 1.5 mg/dL — ABNORMAL HIGH (ref 0.44–1.00)
GFR calc Af Amer: 36 mL/min — ABNORMAL LOW (ref >60)
GFR calc non Af Amer: 32 mL/min — ABNORMAL LOW (ref >60)
GFR, EST AFRICAN AMERICAN: 37 mL/min — AB (ref >60)
GFR, EST NON AFRICAN AMERICAN: 31 mL/min — AB (ref >60)
GLUCOSE: 198 mg/dL — AB (ref 70–99)
Glucose, Bld: 164 mg/dL — ABNORMAL HIGH (ref 70–99)
POTASSIUM: 3.3 mmol/L — AB (ref 3.5–5.1)
Potassium: 3.5 mmol/L (ref 3.5–5.1)
SODIUM: 151 mmol/L — AB (ref 135–145)
SODIUM: 153 mmol/L — AB (ref 135–145)

## 2017-08-23 LAB — CBC
HCT: 26.6 % — ABNORMAL LOW (ref 36.0–46.0)
Hemoglobin: 8.3 g/dL — ABNORMAL LOW (ref 12.0–15.0)
MCH: 26.7 pg (ref 26.0–34.0)
MCHC: 31.2 g/dL (ref 30.0–36.0)
MCV: 85.5 fL (ref 78.0–100.0)
PLATELETS: 218 10*3/uL (ref 150–400)
RBC: 3.11 MIL/uL — AB (ref 3.87–5.11)
RDW: 20.7 % — AB (ref 11.5–15.5)
WBC: 8.1 10*3/uL (ref 4.0–10.5)

## 2017-08-23 LAB — MAGNESIUM: Magnesium: 2 mg/dL (ref 1.7–2.4)

## 2017-08-23 MED ORDER — VITAL AF 1.2 CAL PO LIQD
1500.0000 mL | ORAL | Status: DC
  Administered 2017-08-23 – 2017-08-31 (×7): 1500 mL
  Filled 2017-08-23 (×13): qty 1500

## 2017-08-23 MED ORDER — POTASSIUM CHLORIDE 20 MEQ/15ML (10%) PO SOLN
40.0000 meq | Freq: Once | ORAL | Status: AC
  Administered 2017-08-23: 40 meq
  Filled 2017-08-23: qty 30

## 2017-08-23 MED ORDER — FREE WATER
200.0000 mL | Freq: Three times a day (TID) | Status: DC
  Administered 2017-08-23 (×3): 200 mL

## 2017-08-23 MED ORDER — ACETAMINOPHEN 160 MG/5ML PO SOLN
650.0000 mg | Freq: Four times a day (QID) | ORAL | Status: DC | PRN
Start: 2017-08-23 — End: 2017-09-01
  Administered 2017-08-23 – 2017-08-31 (×2): 650 mg
  Filled 2017-08-23 (×3): qty 20.3

## 2017-08-23 NOTE — Progress Notes (Signed)
Nutrition Follow-up / Consult  DOCUMENTATION CODES:   Obesity unspecified  INTERVENTION:   Continue:  Vital AF 1.2 at 60 ml/h (1440 ml per day) via NGT  Provides 1728 kcal, 108 gm protein, 1168 ml free water daily  NUTRITION DIAGNOSIS:   Inadequate oral intake related to inability to eat as evidenced by NPO status.  Ongoing  GOAL:   Patient will meet greater than or equal to 90% of their needs  Met with TF  MONITOR:   Labs, I & O's, Skin, TF tolerance  REASON FOR ASSESSMENT:   Consult Enteral/tube feeding initiation and management, Assessment of nutrition requirement/status  ASSESSMENT:   77 yo female with PMH of DM, HLD, asthma, GERD, Charcot's joint, R BKA, HLD, and HTN who was admitted on 7/16 with septic shock and pneumoperitoneum; found to have perforated proximal descending colon. S/P extended L hemicolectomy with distal transverse colostomy and mucous fistula creation on 7/17.   Discussed patient in ICU rounds and with RN today. S/P one-way extubation 7/25. She remains lethargic and is unable to take PO's. She is not a good PEG candidate per discussion in rounds. SLP following.   Received MD Consult for assessment of nutrition status and TF initiation and management.  NG tube in place; currently receiving Vital AF 1.2 at 60 ml/h to provide 1728 kcals, 108 gm protein, 1168 ml free water daily.  Free water flushes 200 ml every 8 hours I/O +21 L since admission  Labs reviewed. Sodium 153 (H) CBG's: 256-200-132-128 Medications reviewed and include Novolog.   Diet Order:   Diet Order           Diet NPO time specified  Diet effective now          EDUCATION NEEDS:   No education needs have been identified at this time  Skin:  Skin Assessment: Skin Integrity Issues: Skin Integrity Issues:: Diabetic Ulcer, Incisions Diabetic Ulcer: L foot Incisions: abdomen  Last BM:  colostomy; 1120+ ml 7/28  Height:   Ht Readings from Last 1 Encounters:   08/11/17 5' (1.524 m)    Weight:   Wt Readings from Last 1 Encounters:  08/23/17 191 lb 2.2 oz (86.7 kg)    Ideal Body Weight:  42.5 kg  BMI:  Body mass index is 37.33 kg/m.  Estimated Nutritional Needs:   Kcal:  1550-1750  Protein:  90-110 gm  Fluid:  1.6-1.8 L    Molli Barrows, RD, LDN, [place] Pager [phone] After Hours Pager [phone]

## 2017-08-23 NOTE — Progress Notes (Signed)
13 Days Post-Op   Subjective/Chief Complaint: Pt somnolent    Objective: Vital signs in last 24 hours: Temp:  [97.9 F (36.6 C)-98.5 F (36.9 C)] 97.9 F (36.6 C) (07/29 0747) Pulse Rate:  [86-117] 105 (07/29 1000) Resp:  [14-26] 18 (07/29 1000) BP: (87-131)/(45-70) 120/56 (07/29 1000) SpO2:  [98 %-100 %] 100 % (07/29 1000) FiO2 (%):  [30 %] 30 % (07/29 0400) Weight:  [86.7 kg (191 lb 2.2 oz)] 86.7 kg (191 lb 2.2 oz) (07/29 0434) Last BM Date: 08/23/17  Intake/Output from previous day: 07/28 0701 - 07/29 0700 In: 1748 [NG/GT:1491; IV Piggyback:257] Out: 2775 [Urine:1365; Stool:1410] Intake/Output this shift: Total I/O In: 220 [I.V.:40; NG/GT:180] Out: 275 [Urine:25; Stool:250]  Incision/Wound:open with minimal necrotic material ostomy viable fx  Lab Results:  Recent Labs    08/22/17 0410 08/23/17 0435  WBC 7.2 8.1  HGB 8.4* 8.3*  HCT 26.6* 26.6*  PLT 165 218   BMET Recent Labs    08/22/17 0410 08/23/17 0435  NA 147* 151*  K 3.2* 3.3*  CL 120* 123*  CO2 20* 19*  GLUCOSE 225* 198*  BUN 46* 43*  CREATININE 1.57* 1.54*  CALCIUM 7.3* 7.7*   PT/INR No results for input(s): LABPROT, INR in the last 72 hours. ABG No results for input(s): PHART, HCO3 in the last 72 hours.  Invalid input(s): PCO2, PO2  Studies/Results: No results found.  Anti-infectives: Anti-infectives (From admission, onward)   Start     Dose/Rate Route Frequency Ordered Stop   08/18/17 1200  anidulafungin (ERAXIS) 100 mg in sodium chloride 0.9 % 100 mL IVPB     100 mg 78 mL/hr over 100 Minutes Intravenous Every 24 hours 08/18/17 0907 08/20/17 1432   08/18/17 1000  piperacillin-tazobactam (ZOSYN) IVPB 3.375 g     3.375 g 12.5 mL/hr over 240 Minutes Intravenous Every 8 hours 08/18/17 0907 08/20/17 2250   08/13/17 1100  anidulafungin (ERAXIS) 100 mg in sodium chloride 0.9 % 100 mL IVPB  Status:  Discontinued     100 mg 78 mL/hr over 100 Minutes Intravenous Every 24 hours 08/12/17 1048  08/17/17 1308   08/12/17 1100  anidulafungin (ERAXIS) 200 mg in sodium chloride 0.9 % 200 mL IVPB     200 mg 78 mL/hr over 200 Minutes Intravenous  Once 08/12/17 1048 08/12/17 1648   08/11/17 0600  piperacillin-tazobactam (ZOSYN) IVPB 3.375 g     3.375 g 12.5 mL/hr over 240 Minutes Intravenous Every 8 hours 08/11/17 0025 08/17/17 2359   08/11/17 0115  vancomycin (VANCOCIN) IVPB 750 mg/150 ml premix     750 mg 150 mL/hr over 60 Minutes Intravenous Every 24 hours 08/11/17 0110 08/17/17 0211   08/11/17 0045  piperacillin-tazobactam (ZOSYN) IVPB 3.375 g     3.375 g 100 mL/hr over 30 Minutes Intravenous  Once 08/11/17 0033 08/11/17 0206   07/28/2017 2000  piperacillin-tazobactam (ZOSYN) IVPB 3.375 g  Status:  Discontinued     3.375 g 100 mL/hr over 30 Minutes Intravenous  Once 08/16/2017 1952 08/11/17 0025      Assessment/Plan: s/p Procedure(s): EXPLORATORY LAPAROTOMY FOR FREE AIR, LEFT EXTENDED HEMICOLECTOMY, COLOSTOMY AND MUCUS FISTULA (N/A)  Perforated proximal descending colon Extended left hemicolectomy with distal transverse colostomy and mucous fistula creation, 08/11/17,Dr Kae Heller. POD 9, -TFs atgoal rate and tolerating well. -BID midline dressing changesand prn for saturation from serous drainage -WOC RN for ostomy care -cont zosyn and vanc, Eraxis added 7/18- stopped 7/23 -WBCnormal  Sepsis -secondary to #1 -patient off pressors -cont  abx therapy - discontinue 7/26  Thrombocytopenia -plts down to 45K today -heparin held, HIT antibodies being checked, still pending  VDRF -may wean to extubate per CCM -doing some spontaneous breathing. Trying to wean fentanyl to see if she can be extubated  - she has been tracking some and trying to follow direction last 2 days - extubated 6:30 PM 7/25 - one way extubation per family  DM -SSI for now  HTN -resolved   - BB on hold   Acute Renal Failure -Creatinine1.5    Hypokalemia - getting some replacement IV  now, Mag 1.5; will give additional Mag.   Severe malnutrition - tube feeds/Speech evaluation for swallowing   FEN -NPO, IVFs, TFs at50cc/hrwhich is goal,  VTE - SCDs - Heparin held for low platelets ID -Zosyn 7/16-->Vanc 7/16-->Eraxis 7/18 -->all abx stopped 08/17/17.  (Calcitonin 32) Zosyn/Eraxis restarted 7/24 =>> day 10 to be completed today 7/26     LOS: 13 days    Marcello Moores A Modean Mccullum 08/23/2017

## 2017-08-23 NOTE — Progress Notes (Signed)
CSW continues to follow for placement needs at this time. Pt from [facility] with plans to return once medically stable.   Judith [name], MSW, [place] [facility] Clinical Social Worker [phone]

## 2017-08-23 NOTE — Plan of Care (Signed)
  Problem: Clinical Measurements: Goal: Will remain free from infection Outcome: Progressing Goal: Respiratory complications will improve Outcome: Progressing   Problem: Education: Goal: Knowledge of General Education information will improve Outcome: Not Progressing   Problem: Health Behavior/Discharge Planning: Goal: Ability to manage health-related needs will improve Outcome: Not Progressing   Problem: Clinical Measurements: Goal: Ability to maintain clinical measurements within normal limits will improve Outcome: Not Progressing Goal: Will remain free from infection Outcome: Progressing Goal: Diagnostic test results will improve Outcome: Not Progressing Goal: Respiratory complications will improve Outcome: Progressing Goal: Cardiovascular complication will be avoided Outcome: Not Progressing

## 2017-08-23 NOTE — Progress Notes (Addendum)
PROGRESS NOTE  Judith [name] INO:[id number] DOB: Aug 29, 1940 DOA: 08/01/2017 PCP: System, Pcp Not In  HPI/Recap of past 24 hours: Judith [name] a 77 y.o.female with past medical history significant for hypertension, atrial fibrillation, left foot osteomyelitis, right below the knee amputation, who presented to [facility] ED on 08/23/2017 due to altered mental status. She was found to be in septic shock from perforated proximal descending colon and pneumoperitoneum. She underwent left hemicolectomy with mobilization of the splenic flexure, end colostomy and mucous fistula by surgery. Post op she required continual intubation and vasopressor use and was on PCCM service. Currently she is extubated and off pressors and medical service consulted for management of medical issues.   08/22/2017: Patient seen and examined at her bedside.  She is alert minimally follows commands and nonverbal.  Does not appear to be in acute distress.  08/23/2017: Patient seen and examined at her bedside.  She appears very weak.  She is nonverbal and does not follow commands.     Assessment/Plan: Principal Problem:   Perforation of colon s/p left colectomy/ostomy/mucus fistula 08/11/2017 Active Problems:   Acute respiratory failure with hypoxia (HCC)   S/P exploratory laparotomy   Septic shock (HCC)   Acute respiratory failure (HCC)   Atrial fibrillation (HCC)   Goals of care, counseling/discussion   DNR (do not resuscitate)   Intra-abdominal free air of unknown etiology   Severe protein-calorie malnutrition ([place])   Status post exploratory laparotomy for free air, left extended hemicolectomy, colostomy and mucous fistula General surgery managing  Acute metabolic encephalopathy most likely multifactorial secondary to acute illness versus others Nonverbal and minimally responds/follows commands Unclear what her baseline is Reorient as needed Neurochecks every 4 hours  Hypokalemia, repleted K+ 3.3 Mg2+ 2.0 Repeat BMP  this afternoon  Severe protein calorie malnutrition Albumin less than 1.0 on 08/20/17 Repeat CMP in the morning Dietitian consult to assess for nutritional needs and replacement  Worsening generalized weakness/severe physical debility  Suspect multifactorial secondary to acute illness versus hypokalemia versus others Replete electrolytes as indicated Nutritional supplement via NG tube PT OT to assess once stable  Acute hypervolemic hyponatremia Sodium 151 from 147 Obtain serum osmolality and urine osmolality Start free water flushes 200 cc every 6 hours Repeat BMP in the afternoon  AKI on CKD 3 Baseline creatinine appears to be 1.4 with GFR of 40 Creatinine 1.54 from 1.57 from 1.6 yesterday and 2.0 prior Continue to avoid nephrotic agents/hypotension/dehydration  Acute hypoxic respiratory failure postop requiring mechanical ventilation from 7/16 through 7/25 Continue O2 supplementation via [place] to maintain O2 saturation between 88 and 92% Continue bronchodilators as needed  Acute blood loss anemia post surgery Hemoglobin is stable at 8.7 Baseline hemoglobin 12 No overt sign of bleeding Obtain iron studies Repeat CBC in the morning  Paroxysmal A. fib Rate controlled Continue IV Lopressor as needed  PVD status post right below the knee amputation PT to assess when stable  Resolved thrombocytopenia  Platelet 218 on 08/23/2017 Defer chemical DVT prophylaxis to general surgery  Goals of care Palliative care team following to establish goals of care DNR    Code Status: DNR  Family Communication: None at bedside   SIGNIFICANT EVENTS: 7/16 > admit, to OR for ex-lap. 7/17 > remains intubated after hemicolectomy and colostomy placement 7/18 > worsening labs overnight, poor UOP, HCO3 infusion started 7/19 > improved ABG, improved lactic acid 7/20 >atrial fibrillation with rate into 130s, norepinephrine changed to phenylephrine  7/21 > fentanyl weaned down, vasopressin  stopped, intermittently in atrial fibrillation. Potassium replaced overnight, intermittent a fib with drop in pressure during these episodes, responded well to Lopressor 5 mg 7/22 > decreased fentanyl from 50 mcg to 25 mcg with no improvement in neurological status, palliative care consult placed 7/23 > antibiotics course completed 7/24 > GOC meeting with family, changed status to DNR, would like patient extubated whenever CCM thinks it will have best chance for success 7/25 > Extubated successfully, no longer on pressors or sedation. 7/27 > medical service consulted for medical issues.    ANTIBIOTICS: Vanc 7/17 > 7/23 Zosyn 7/17 >7/26 Eraxis 7/18 >7/26    DVT prophylaxis:  SCDs Defer chemical DVT prophylaxis to general surgery   Objective: Vitals:   08/23/17 1100 08/23/17 1140 08/23/17 1200 08/23/17 1300  BP: (!) 116/54  (!) 105/57 114/76  Pulse: 91  90 93  Resp: (!) 24  20 (!) 21  Temp:  98.1 F (36.7 C)    TempSrc:  Oral    SpO2: 100%  98% 99%  Weight:      Height:        Intake/Output Summary (Last 24 hours) at 08/23/2017 1334 Last data filed at 08/23/2017 1300 Gross per 24 hour  Intake 1757.65 ml  Output 2990 ml  Net -1232.35 ml   Filed Weights   08/19/17 0500 08/20/17 0416 08/23/17 0434  Weight: 91.1 kg (200 lb 13.4 oz) 91 kg (200 lb 9.9 oz) 86.7 kg (191 lb 2.2 oz)    Exam:  . General: 77 y.o. year-old female well-developed well-nourished in no acute distress.  Appears very weak.  Nonverbal.   . Cardiovascular: Regular rate and rhythm with no rubs or gallops.  No thyromegaly or JVD noted. Marland Kitchen Respiratory: Mild rales at bases with no wheezes.  Poor inspiratory effort. . Abdomen: Soft nontender nondistended with normal bowel sounds x4 quadrants. . Musculoskeletal: Right below the knee amputation.  Left foot in a boot. Marland Kitchen Psychiatry: Unable to assess mood due to altered mental status.   Data Reviewed: CBC: Recent Labs  Lab 08/17/17 0423 08/20/17 0506  08/21/17 0453 08/22/17 0410 08/23/17 0435  WBC 10.9* 7.5 7.8 7.2 8.1  HGB 9.7* 8.7* 8.7* 8.4* 8.3*  HCT 29.2* 26.8* 26.3* 26.6* 26.6*  MCV 79.8 82.2 81.4 84.7 85.5  PLT 45* 73* 116* 165 938   Basic Metabolic Panel: Recent Labs  Lab 08/18/17 0412 08/20/17 0506 08/20/17 0920 08/21/17 0453 08/22/17 0410 08/23/17 0435  NA  --  144 142 144 147* 151*  K  --  2.8* 2.6* 3.3* 3.2* 3.3*  CL  --  113* 114* 115* 120* 123*  CO2  --  22 20* 18* 20* 19*  GLUCOSE  --  172* 148* 223* 225* 198*  BUN  --  51* 42* 49* 46* 43*  CREATININE  --  1.63* 1.41* 1.60* 1.57* 1.54*  CALCIUM  --  7.0* 6.2* 7.3* 7.3* 7.7*  MG 1.6* 1.5*  --  1.8  --  2.0  PHOS 2.8  --   --   --   --   --    GFR: Estimated Creatinine Clearance: 29.5 mL/min (A) (by C-G formula based on SCr of 1.54 mg/dL (H)). Liver Function Tests: Recent Labs  Lab 08/20/17 0920  AST 20  ALT 11  ALKPHOS 123  BILITOT 0.9  PROT 3.6*  ALBUMIN <1.0*   No results for input(s): LIPASE, AMYLASE in the last 168 hours. No results for input(s): AMMONIA in the last 168 hours. Coagulation Profile:  No results for input(s): INR, PROTIME in the last 168 hours. Cardiac Enzymes: No results for input(s): CKTOTAL, CKMB, CKMBINDEX, TROPONINI in the last 168 hours. BNP (last 3 results) No results for input(s): PROBNP in the last 8760 hours. HbA1C: Recent Labs    08/22/17 0410  HGBA1C 8.1*   CBG: Recent Labs  Lab 08/22/17 1944 08/23/17 0001 08/23/17 0352 08/23/17 0743 08/23/17 1138  GLUCAP 228* 256* 200* 132* 128*   Lipid Profile: No results for input(s): CHOL, HDL, LDLCALC, TRIG, CHOLHDL, LDLDIRECT in the last 72 hours. Thyroid Function Tests: No results for input(s): TSH, T4TOTAL, FREET4, T3FREE, THYROIDAB in the last 72 hours. Anemia Panel: No results for input(s): VITAMINB12, FOLATE, FERRITIN, TIBC, IRON, RETICCTPCT in the last 72 hours. Urine analysis:    Component Value Date/Time   COLORURINE AMBER (A) 08/11/2017 0521    APPEARANCEUR CLOUDY (A) 08/11/2017 0521   LABSPEC 1.019 08/11/2017 0521   PHURINE 5.0 08/11/2017 0521   GLUCOSEU NEGATIVE 08/11/2017 0521   HGBUR SMALL (A) 08/11/2017 0521   BILIRUBINUR SMALL (A) 08/11/2017 0521   KETONESUR NEGATIVE 08/11/2017 0521   PROTEINUR 30 (A) 08/11/2017 0521   UROBILINOGEN 0.2 10/17/2014 0849   NITRITE NEGATIVE 08/11/2017 0521   LEUKOCYTESUR LARGE (A) 08/11/2017 0521   Sepsis Labs: @LABRCNTIP (procalcitonin:4,lacticidven:4)  )No results found for this or any previous visit (from the past 240 hour(s)).    Studies: No results found.  Scheduled Meds: . chlorhexidine  15 mL Mouth Rinse BID  . free water  200 mL Per Tube Q8H  . insulin aspart  0-15 Units Subcutaneous Q4H  . mouth rinse  15 mL Mouth Rinse q12n4p  . pantoprazole sodium  40 mg Per Tube Daily    Continuous Infusions: . sodium chloride 10 mL/hr at 08/23/17 1300  . feeding supplement (VITAL AF 1.2 CAL) 1,500 mL (08/23/17 0429)     LOS: 13 days     Kayleen Memos, MD [facility] Pager [phone]  If 7PM-7AM, please contact night-coverage [email or web address] Password [email or web address] 08/23/2017, 1:34 PM

## 2017-08-23 NOTE — Progress Notes (Signed)
Daily Progress Note   Patient Name: Judith [name]       Date: 08/23/2017 DOB: [birth year]  Age: 77 y.o. MRN#: [id number] Attending Physician: Nolon Nations, MD Primary Care Physician: System, Pcp Not In Admit Date: 08/07/2017  Reason for Consultation/Follow-up: Establishing goals of care and Psychosocial/spiritual support  Subjective: Patient is non-verbal and does not interact with me today as she did yesterday.    I spoke with [facility] attending team.  PEG is not an option.  They recommend continuing with feedings thru her nose to see if she will gain strength over the next 1+ weeks.   I spoke with Judith [name] (ex DIL but main contact for the family) on the phone.  I conveyed to her that I am concerned about the increased area of blackening on the patient's left foot.  I also discussed surgery's recommendations regarding feeding.    Judith [name] and I decided to plan for next week (Wednesday 8/7 at 3:00 pm) to have another extended family discussion.  I explained that if by that time Judith [name] is not able to take oral nutrition the recommendation would be [facility].  If she is able to take enough oral nutrition to sustain herself then the recommendation will be to return to SNF followed by either Palliative or Hospice.  Judith [name] agreed.    Patient Profile/HPI:  77 y.o.femalewith past medical history of IDDM, charcot's joint, dementia, anxiety, proctocolitis, PUD, right BKA, asthma, and arthritiswho was admitted on7/16/2019with septic shock. She was found to have a bowel perforation and was taken to the OR.She received a hemicolectomy with colostomy and mucous fistula. Her post op course has been a slow decline. She has weaned from pressors and the vent.  She is unable to eat and extremely weak.   Length of  Stay: 13  Current Medications: Scheduled Meds:  . chlorhexidine  15 mL Mouth Rinse BID  . free water  200 mL Per Tube Q8H  . insulin aspart  0-15 Units Subcutaneous Q4H  . mouth rinse  15 mL Mouth Rinse q12n4p  . pantoprazole sodium  40 mg Per Tube Daily    Continuous Infusions: . sodium chloride Stopped (08/20/17 6010)  . feeding supplement (VITAL AF 1.2 CAL) 1,500 mL (08/23/17 0429)    PRN Meds: sodium chloride, albuterol, hydroxypropyl methylcellulose / hypromellose, midazolam,  sodium chloride  Physical Exam       Elderly chronically ill female, eyes open but not responding to my voice or touch today. CV tachy Resp no distress Abdomen not examined LLE increased black color spreading over her 2 thru 5th toes and up to the mid dorsal area.  Vital Signs: BP 131/66   Pulse 100   Temp 97.9 F (36.6 C) (Axillary)   Resp (!) 22   Ht 5' (1.524 m)   Wt 86.7 kg (191 lb 2.2 oz)   SpO2 100%   BMI 37.33 kg/m  SpO2: SpO2: 100 % O2 Device: O2 Device: Room Air O2 Flow Rate: O2 Flow Rate (L/min): 3 L/min  Intake/output summary:   Intake/Output Summary (Last 24 hours) at 08/23/2017 0944 Last data filed at 08/23/2017 0900 Gross per 24 hour  Intake 1604.66 ml  Output 3050 ml  Net -1445.34 ml   LBM: Last BM Date: 08/23/17 Baseline Weight: Weight: 86.2 kg (190 lb 0.6 oz) Most recent weight: Weight: 86.7 kg (191 lb 2.2 oz)       Palliative Assessment/Data:  10%    Flowsheet Rows     Most Recent Value  Intake Tab  Referral Department  Hospitalist  Unit at Time of Referral  ICU  Palliative Care Primary Diagnosis  Other (Comment) [Perforated viscus]  Date Notified  08/16/17  Palliative Care Type  Return patient Palliative Care  Reason for referral  Clarify Goals of Care  Date of Admission  07/30/2017  Date first seen by Palliative Care  08/17/17  # of days Palliative referral response time  1 Day(s)  # of days IP prior to Palliative referral  6  Clinical Assessment    Psychosocial & Spiritual Assessment  Palliative Care Outcomes      Patient Active Problem List   Diagnosis Date Noted  . Intra-abdominal free air of unknown etiology   . Severe protein-calorie malnutrition ([place])   . Atrial fibrillation ([place])   . Goals of care, counseling/discussion   . DNR (do not resuscitate)   . Acute respiratory failure ([place])   . Acute respiratory failure with hypoxia ([place])   . S/P exploratory laparotomy   . Septic shock ([place])   . Perforation of colon s/p left colectomy/ostomy/mucus fistula 08/11/2017 07/28/2017  . Encounter for hospice care discussion   . Palliative care encounter   . Acute UTI   . AKI (acute kidney injury) ([place])   . Osteomyelitis of left foot ([place])   . UTI (urinary tract infection) 04/23/2017  . Osteomyelitis due to type 2 diabetes mellitus ([place]) 04/23/2017  . Uterine hypertrophy 06/11/2015  . Kidney lesion, native, bilateral 06/11/2015  . Protein-calorie malnutrition, severe ([place]) 09/22/2013  . Dementia 09/21/2013  . Constipation 09/21/2013  . Rectal bleeding 09/21/2013  . Fecal impaction ([place]) 09/21/2013  . BRBPR (bright red blood per rectum) 09/21/2013  . Altered mental status 05/17/2013  . Altered mental state 05/17/2013  . Delirium due to another medical condition 02/18/2013  . Acute blood loss anemia 03/03/2012  . Hx of right BKA ([place]) 03/03/2012  . Fall 03/03/2012  . Obesity (BMI 30-39.9) 03/03/2012  . Poor nutrition 03/03/2012  . Fracture of femur, distal, right, closed ([place]) 03/01/2012  . Lower urinary tract infectious disease 03/01/2012  . Insulin dependent diabetes mellitus ([place]) 03/01/2012  . Hypertension     Palliative Care Plan    Recommendations/Plan:  Continue current care.  No PEG per surgery and family.  SLP to continue intermittent evaluations  PMT to follow with you and regularly touch base with family  PMT has tentatively scheduled an extended family meeting 8/7 at 3:00 pm   Goals of Care and  Additional Recommendations:  Limitations on Scope of Treatment: Full Scope Treatment  Code Status:  DNR  Prognosis:   Very fragile.  She is extremely week.  If she is unable to eat and sustain her nutrition her prognosis is two weeks at best once artificial nutrition and hydration are discontinued.  Discharge Planning:  To Be Determined    Care plan was discussed with CCS and Judith [name].  Thank you for allowing the Palliative Medicine Team to assist in the care of this patient.  Total time spent:  35 min.     Greater than 50%  of this time was spent counseling and coordinating care related to the above assessment and plan.  [name], PA-C Palliative Medicine  Please contact Palliative MedicineTeam phone at [phone] for questions and concerns between 7 am - 7 pm.   Please see AMION for individual provider pager numbers.

## 2017-08-23 NOTE — Progress Notes (Signed)
  Speech Language Pathology Treatment: Dysphagia  Patient Details Name: JEREMY MCLAMB MRN: [id number] DOB: [birth year] Today's Date: 08/23/2017 Time: 6333-5456 SLP Time Calculation (min) (ACUTE ONLY): 10 min  Assessment / Plan / Recommendation Clinical Impression  Limited improvement.  Pt alert, eyes open and able to state her name; voice is weak/aphonic, unable to cough on command. Receptive to ice chips, which continue to elicit multiple swallows, effortful mastication, but no overt s/s of aspiration.  Tspns water elicit delayed cough, concerning for aspiration.  Prolonged intubation, cognitive impairment, and deconditioning continue to impact swallow function.  Recommend allowing occasional ice chips after oral care; otherwise, continue NPO and TF.  Will follow along for [place].   HPI HPI: 77 y.o. female  with past medical history of IDDM, charcot's joint, dementia, anxiety, proctocolitis, PUD, right BKA, asthma, and arthritis who was admitted on 08/12/2017 with septic shock.  She was found to have a bowel perforation and was taken to the OR.  She received a hemicolectomy with colostomy and mucous fistula. Intubated 08/11/17 with one-way extubation on 08/19/17. Per palliative, prognosis <2 weeks in setting of hypotensive with Afib, severely deconditioned, malnourished, demented, with a large abdominal wound. No tracheostomy, no PEG.       SLP Plan  Continue with current plan of care       Recommendations  Diet recommendations: NPO Medication Administration: Via alternative means                Oral Care Recommendations: Oral care QID;Oral care prior to ice chip/H20 Follow up Recommendations: Other (comment)(tba) SLP Visit Diagnosis: Dysphagia, unspecified (R13.10) Plan: Continue with current plan of care       GO                Assunta Curtis 08/23/2017, 11:31 AM  Estill Bamberg L. Tivis Ringer, [place] CCC/SLP Pager [phone]

## 2017-08-24 DIAGNOSIS — Z4659 Encounter for fitting and adjustment of other gastrointestinal appliance and device: Secondary | ICD-10-CM

## 2017-08-24 LAB — OSMOLALITY: Osmolality: 327 mOsm/kg (ref 275–295)

## 2017-08-24 LAB — BASIC METABOLIC PANEL
Anion gap: 9 (ref 5–15)
BUN: 42 mg/dL — AB (ref 8–23)
CALCIUM: 7.7 mg/dL — AB (ref 8.9–10.3)
CO2: 17 mmol/L — ABNORMAL LOW (ref 22–32)
CREATININE: 1.52 mg/dL — AB (ref 0.44–1.00)
Chloride: 125 mmol/L — ABNORMAL HIGH (ref 98–111)
GFR calc non Af Amer: 32 mL/min — ABNORMAL LOW (ref >60)
GFR, EST AFRICAN AMERICAN: 37 mL/min — AB (ref >60)
Glucose, Bld: 284 mg/dL — ABNORMAL HIGH (ref 70–99)
Potassium: 3.2 mmol/L — ABNORMAL LOW (ref 3.5–5.1)
SODIUM: 151 mmol/L — AB (ref 135–145)

## 2017-08-24 LAB — COMPREHENSIVE METABOLIC PANEL
ALBUMIN: 1.2 g/dL — AB (ref 3.5–5.0)
ALT: 18 U/L (ref 0–44)
AST: 21 U/L (ref 15–41)
Alkaline Phosphatase: 118 U/L (ref 38–126)
Anion gap: 7 (ref 5–15)
BILIRUBIN TOTAL: 0.8 mg/dL (ref 0.3–1.2)
BUN: 42 mg/dL — ABNORMAL HIGH (ref 8–23)
CALCIUM: 7.9 mg/dL — AB (ref 8.9–10.3)
CO2: 19 mmol/L — ABNORMAL LOW (ref 22–32)
Chloride: 127 mmol/L — ABNORMAL HIGH (ref 98–111)
Creatinine, Ser: 1.56 mg/dL — ABNORMAL HIGH (ref 0.44–1.00)
GFR, EST AFRICAN AMERICAN: 36 mL/min — AB (ref >60)
GFR, EST NON AFRICAN AMERICAN: 31 mL/min — AB (ref >60)
Glucose, Bld: 225 mg/dL — ABNORMAL HIGH (ref 70–99)
POTASSIUM: 3.5 mmol/L (ref 3.5–5.1)
Sodium: 153 mmol/L — ABNORMAL HIGH (ref 135–145)
TOTAL PROTEIN: 4.7 g/dL — AB (ref 6.5–8.1)

## 2017-08-24 LAB — MAGNESIUM: MAGNESIUM: 1.8 mg/dL (ref 1.7–2.4)

## 2017-08-24 LAB — GLUCOSE, CAPILLARY
GLUCOSE-CAPILLARY: 168 mg/dL — AB (ref 70–99)
GLUCOSE-CAPILLARY: 178 mg/dL — AB (ref 70–99)
GLUCOSE-CAPILLARY: 210 mg/dL — AB (ref 70–99)
Glucose-Capillary: 197 mg/dL — ABNORMAL HIGH (ref 70–99)
Glucose-Capillary: 221 mg/dL — ABNORMAL HIGH (ref 70–99)

## 2017-08-24 LAB — IRON AND TIBC
Iron: 20 ug/dL — ABNORMAL LOW (ref 28–170)
Saturation Ratios: 13 % (ref 10.4–31.8)
TIBC: 151 ug/dL — AB (ref 250–450)
UIBC: 131 ug/dL

## 2017-08-24 LAB — CBC
HEMATOCRIT: 28 % — AB (ref 36.0–46.0)
Hemoglobin: 8.6 g/dL — ABNORMAL LOW (ref 12.0–15.0)
MCH: 26.5 pg (ref 26.0–34.0)
MCHC: 30.7 g/dL (ref 30.0–36.0)
MCV: 86.4 fL (ref 78.0–100.0)
Platelets: 281 10*3/uL (ref 150–400)
RBC: 3.24 MIL/uL — ABNORMAL LOW (ref 3.87–5.11)
RDW: 20.5 % — AB (ref 11.5–15.5)
WBC: 8.1 10*3/uL (ref 4.0–10.5)

## 2017-08-24 LAB — PHOSPHORUS: PHOSPHORUS: 3.5 mg/dL (ref 2.5–4.6)

## 2017-08-24 LAB — FERRITIN: Ferritin: 198 ng/mL (ref 11–307)

## 2017-08-24 LAB — VITAMIN B12: VITAMIN B 12: 1451 pg/mL — AB (ref 180–914)

## 2017-08-24 LAB — RETICULOCYTES
RBC.: 3.24 MIL/uL — ABNORMAL LOW (ref 3.87–5.11)
RETIC CT PCT: 1.4 % (ref 0.4–3.1)
Retic Count, Absolute: 45.4 10*3/uL (ref 19.0–186.0)

## 2017-08-24 LAB — OSMOLALITY, URINE: Osmolality, Ur: 426 mOsm/kg (ref 300–900)

## 2017-08-24 MED ORDER — MAGNESIUM SULFATE 2 GM/50ML IV SOLN
2.0000 g | Freq: Once | INTRAVENOUS | Status: AC
  Administered 2017-08-24: 2 g via INTRAVENOUS
  Filled 2017-08-24: qty 50

## 2017-08-24 MED ORDER — METOPROLOL TARTRATE 12.5 MG HALF TABLET
12.5000 mg | ORAL_TABLET | Freq: Two times a day (BID) | ORAL | Status: DC
  Administered 2017-08-25 – 2017-09-01 (×15): 12.5 mg via ORAL
  Filled 2017-08-24 (×16): qty 1

## 2017-08-24 MED ORDER — HEPARIN SODIUM (PORCINE) 5000 UNIT/ML IJ SOLN
5000.0000 [IU] | Freq: Three times a day (TID) | INTRAMUSCULAR | Status: DC
  Administered 2017-08-24 – 2017-09-01 (×24): 5000 [IU] via SUBCUTANEOUS
  Filled 2017-08-24 (×24): qty 1

## 2017-08-24 MED ORDER — FERROUS SULFATE 300 (60 FE) MG/5ML PO SYRP
300.0000 mg | ORAL_SOLUTION | Freq: Two times a day (BID) | ORAL | Status: DC
  Administered 2017-08-24 – 2017-09-01 (×16): 300 mg
  Filled 2017-08-24 (×19): qty 5

## 2017-08-24 MED ORDER — FREE WATER
250.0000 mL | Freq: Four times a day (QID) | Status: DC
  Administered 2017-08-24 – 2017-09-01 (×31): 250 mL

## 2017-08-24 MED ORDER — SODIUM CHLORIDE 0.45 % IV SOLN
INTRAVENOUS | Status: DC
  Administered 2017-08-24: 500 mL via INTRAVENOUS

## 2017-08-24 MED ORDER — FREE WATER
200.0000 mL | Freq: Four times a day (QID) | Status: DC
  Administered 2017-08-24 (×2): 200 mL

## 2017-08-24 NOTE — Consult Note (Signed)
Referring Physician: Francia Greaves, MD  TYRICA AFZAL is an 77 y.o. female.                       Chief Complaint: Paroxysmal atrial fibrillation  HPI: 77 year old female with recent bowel surgery for perforation of colon has paroxysmal atrial fibrillation. Patient is in sinus rhythm. She is anemic post surgery. She also has severe protein calorie malnutrition, DNR status and multiple medical condition.  Past Medical History:  Diagnosis Date  . Anxiety   . Arthritis    "left leg" (05/17/2013)  . Asthma   . 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 Closed right ankle fracture   . Colitis 09/06/02  . Diabetes mellitus without complication ([place])   . Fracture of femur, distal, right, closed ([place]) 03/01/2012  . GERD (gastroesophageal reflux disease)   . WGNFAOZH(086.5)    "probably weekly" (05/17/2013)  . Hemorrhoid   . High cholesterol   . History of stomach ulcers   . Hyperlipidemia   . Hypertension   . Insulin dependent diabetes mellitus ([place]) 03/01/2012  . Obesity (BMI 30-39.9) 03/03/2012  . Perforated viscus 08/15/2017  . Pneumonia    "once" (05/17/2013)  . Proctocolitis 09/04/02      Past Surgical History:  Procedure Laterality Date  . ABDOMINAL HYSTERECTOMY    . BELOW KNEE LEG AMPUTATION Right 02/2008  . BREAST LUMPECTOMY Bilateral    "not cancer"  . CATARACT EXTRACTION W/ INTRAOCULAR LENS  IMPLANT, BILATERAL Bilateral   . LAPAROTOMY N/A 08/03/2017   Procedure: EXPLORATORY LAPAROTOMY FOR FREE AIR, LEFT EXTENDED HEMICOLECTOMY, COLOSTOMY AND MUCUS FISTULA;  Surgeon: Clovis Riley, MD;  Location: [facility];  Service: General;  Laterality: N/A;  . ORIF FEMUR FRACTURE  03/01/2012   Procedure: OPEN REDUCTION INTERNAL FIXATION (ORIF) DISTAL FEMUR FRACTURE;  Surgeon: Rozanna Box, MD;  Location: [facility];  Service: Orthopedics;  Laterality: Right;  ORIF right femur    Family History  Problem Relation Age of Onset  . Diabetes Mother   . Diabetes Father   . Diabetes Sister        x3   . Diabetes Brother        x4  . Colon cancer Neg Hx   . Colon polyps Neg Hx    Social History:  reports that she quit smoking about 24 years ago. Her smoking use included cigarettes. She started smoking about 29 years ago. She has a 5.00 pack-year smoking history. She has never used smokeless tobacco. She reports that she does not drink alcohol or use drugs.  Allergies: No Known Allergies  Medications Prior to Admission  Medication Sig Dispense Refill  . acetaminophen (TYLENOL) 325 MG tablet Take 650 mg by mouth every 6 (six) hours as needed for mild pain.    . Amino Acids-Protein Hydrolys (FEEDING SUPPLEMENT, PRO-STAT SUGAR FREE 64,) LIQD Take 30 mLs by mouth 2 (two) times daily.    . Ascorbic Acid (VITAMIN C PO) Take 1 tablet by mouth 2 (two) times daily.     Marland Kitchen aspirin 325 MG tablet Take 325 mg by mouth daily with breakfast.     . atorvastatin (LIPITOR) 20 MG tablet Take 20 mg by mouth every evening.     Marland Kitchen Dextrose, Diabetic Use, (INSTA-GLUCOSE) 77.4 % GEL Take 1 vial by mouth daily as needed (Diabetes, Give when CBG is <60).    . insulin aspart (NOVOLOG) 100 UNIT/ML injection Inject 0-15 Units into  the skin See admin instructions. Three times daily Per sliding scale   0-150 = 0 units 151-200 = 3 units 201-250 = 5 units 251-300 = 7 units 301-350 = 9 units 351-400 = 11 units 401-450 = 13 units 451-500 = 15 units and call provider    . insulin detemir (LEVEMIR) 100 UNIT/ML injection Inject 0.06 mLs (6 Units total) into the skin daily. (Patient taking differently: Inject 15 Units into the skin every 12 (twelve) hours. ) 10 mL 11  . loperamide (IMODIUM) 2 MG capsule Take 2 mg by mouth every 8 (eight) hours as needed for diarrhea or loose stools.    . magnesium hydroxide (MILK OF MAGNESIA) 400 MG/5ML suspension Take 10 mLs by mouth daily as needed for mild constipation.     . megestrol (MEGACE) 400 MG/10ML suspension Take 800 mg by mouth daily.    . metoprolol succinate (TOPROL-XL) 25 MG  24 hr tablet Take 75 mg by mouth daily with breakfast. Take with or immediately following a meal.     . mirtazapine (REMERON) 7.5 MG tablet Take 7.5 mg by mouth every other day.    . polyethylene glycol (MIRALAX / GLYCOLAX) packet Take 17 g by mouth daily. (Patient taking differently: Take 17 g by mouth daily as needed for mild constipation. ) 14 each 0  . Probiotic Product (PROBIOTIC PO) Take 1 capsule by mouth daily.    . promethazine (PHENERGAN) 25 MG tablet Take 25 mg by mouth every 6 (six) hours as needed for nausea or vomiting.    . ranitidine (ZANTAC) 150 MG tablet Take 150 mg by mouth daily with supper.     . zinc gluconate 50 MG tablet Take 50 mg by mouth daily with breakfast.      Results for orders placed or performed during the hospital encounter of 08/16/2017 (from the past 48 hour(s))  Glucose, capillary     Status: Abnormal   Collection Time: 08/22/17  7:44 PM  Result Value Ref Range   Glucose-Capillary 228 (H) 70 - 99 mg/dL   Comment 1 Notify RN   Glucose, capillary     Status: Abnormal   Collection Time: 08/23/17 12:01 AM  Result Value Ref Range   Glucose-Capillary 256 (H) 70 - 99 mg/dL   Comment 1 Notify RN   Glucose, capillary     Status: Abnormal   Collection Time: 08/23/17  3:52 AM  Result Value Ref Range   Glucose-Capillary 200 (H) 70 - 99 mg/dL   Comment 1 Notify RN   CBC     Status: Abnormal   Collection Time: 08/23/17  4:35 AM  Result Value Ref Range   WBC 8.1 4.0 - 10.5 K/uL   RBC 3.11 (L) 3.87 - 5.11 MIL/uL   Hemoglobin 8.3 (L) 12.0 - 15.0 g/dL   HCT 26.6 (L) 36.0 - 46.0 %   MCV 85.5 78.0 - 100.0 fL   MCH 26.7 26.0 - 34.0 pg   MCHC 31.2 30.0 - 36.0 g/dL   RDW 20.7 (H) 11.5 - 15.5 %   Platelets 218 150 - 400 K/uL    Comment: Performed at [facility], [address], [place], [place] [address]  Basic metabolic panel     Status: Abnormal   Collection Time: 08/23/17  4:35 AM  Result Value Ref Range   Sodium 151 (H) 135 - 145 mmol/L   Potassium 3.3  (L) 3.5 - 5.1 mmol/L   Chloride 123 (H) 98 - 111 mmol/L  CO2 19 (L) 22 - 32 mmol/L   Glucose, Bld 198 (H) 70 - 99 mg/dL   BUN 43 (H) 8 - 23 mg/dL   Creatinine, Ser 1.54 (H) 0.44 - 1.00 mg/dL   Calcium 7.7 (L) 8.9 - 10.3 mg/dL   GFR calc non Af Amer 31 (L) >60 mL/min   GFR calc Af Amer 36 (L) >60 mL/min    Comment: (NOTE) The eGFR has been calculated using the CKD EPI equation. This calculation has not been validated in all clinical situations. eGFR's persistently <60 mL/min signify possible Chronic Kidney Disease.    Anion gap 9 5 - 15    Comment: Performed at [facility] [address], [place], [place] [address]  Magnesium     Status: None   Collection Time: 08/23/17  4:35 AM  Result Value Ref Range   Magnesium 2.0 1.7 - 2.4 mg/dL    Comment: Performed at [facility] [address], [place], [place] [address]  Glucose, capillary     Status: Abnormal   Collection Time: 08/23/17  7:43 AM  Result Value Ref Range   Glucose-Capillary 132 (H) 70 - 99 mg/dL   Comment 1 Capillary Specimen    Comment 2 Notify RN   Glucose, capillary     Status: Abnormal   Collection Time: 08/23/17 11:38 AM  Result Value Ref Range   Glucose-Capillary 128 (H) 70 - 99 mg/dL   Comment 1 Capillary Specimen    Comment 2 Notify RN   Basic metabolic panel     Status: Abnormal   Collection Time: 08/23/17  1:24 PM  Result Value Ref Range   Sodium 153 (H) 135 - 145 mmol/L   Potassium 3.5 3.5 - 5.1 mmol/L   Chloride 128 (H) 98 - 111 mmol/L   CO2 19 (L) 22 - 32 mmol/L   Glucose, Bld 164 (H) 70 - 99 mg/dL   BUN 43 (H) 8 - 23 mg/dL   Creatinine, Ser 1.50 (H) 0.44 - 1.00 mg/dL   Calcium 7.7 (L) 8.9 - 10.3 mg/dL   GFR calc non Af Amer 32 (L) >60 mL/min   GFR calc Af Amer 37 (L) >60 mL/min    Comment: (NOTE) The eGFR has been calculated using the CKD EPI equation. This calculation has not been validated in all clinical situations. eGFR's persistently <60 mL/min signify possible Chronic  Kidney Disease.    Anion gap 6 5 - 15    Comment: Performed at [facility] [address], [place], [place] [address]  Glucose, capillary     Status: Abnormal   Collection Time: 08/23/17  3:30 PM  Result Value Ref Range   Glucose-Capillary 138 (H) 70 - 99 mg/dL   Comment 1 Capillary Specimen    Comment 2 Notify RN   Glucose, capillary     Status: Abnormal   Collection Time: 08/23/17  4:38 PM  Result Value Ref Range   Glucose-Capillary 152 (H) 70 - 99 mg/dL   Comment 1 Venous Specimen   Glucose, capillary     Status: Abnormal   Collection Time: 08/23/17  7:37 PM  Result Value Ref Range   Glucose-Capillary 155 (H) 70 - 99 mg/dL   Comment 1 Capillary Specimen    Comment 2 Notify RN   Glucose, capillary     Status: Abnormal   Collection Time: 08/23/17 11:28 PM  Result Value Ref Range   Glucose-Capillary 148 (H) 70 - 99 mg/dL   Comment  1 Capillary Specimen    Comment 2 Notify RN   Glucose, capillary     Status: Abnormal   Collection Time: 08/24/17  3:43 AM  Result Value Ref Range   Glucose-Capillary 168 (H) 70 - 99 mg/dL   Comment 1 Capillary Specimen    Comment 2 Notify RN   Osmolality     Status: Abnormal   Collection Time: 08/24/17  4:00 AM  Result Value Ref Range   Osmolality 327 (HH) 275 - 295 mOsm/kg    Comment: REPEATED TO VERIFY CRITICAL RESULT CALLED TO, READ BACK BY AND VERIFIED WITH: [facility] RN 1941 08/24/17 MITCHELL,L Performed at [facility], [place] [address], [place], [place] [address]   Iron and TIBC     Status: Abnormal   Collection Time: 08/24/17  4:08 AM  Result Value Ref Range   Iron 20 (L) 28 - 170 ug/dL   TIBC 151 (L) 250 - 450 ug/dL   Saturation Ratios 13 10.4 - 31.8 %   UIBC 131 ug/dL    Comment: Performed at [facility], [place] [address], [place], [place] [address]  Ferritin     Status: None   Collection Time: 08/24/17  4:08 AM  Result Value Ref Range   Ferritin 198 11 - 307 ng/mL    Comment: Performed at [facility], [place] [address] [place], [place] [address]  Reticulocytes     Status: Abnormal   Collection Time: 08/24/17  4:08 AM  Result Value Ref Range   Retic Ct Pct 1.4 0.4 - 3.1 %   RBC. 3.24 (L) 3.87 - 5.11 MIL/uL   Retic Count, Absolute 45.4 19.0 - 186.0 K/uL    Comment: Performed at [facility] [address] [place], [place] [address]  Vitamin B12     Status: Abnormal   Collection Time: 08/24/17  4:08 AM  Result Value Ref Range   Vitamin B-12 1,451 (H) 180 - 914 pg/mL    Comment: (NOTE) This assay is not validated for testing neonatal or myeloproliferative syndrome specimens for Vitamin B12 levels. Performed at [facility], [place] [address], [place], [place] [address]   CBC     Status: Abnormal   Collection Time: 08/24/17  4:08 AM  Result Value Ref Range   WBC 8.1 4.0 - 10.5 K/uL   RBC 3.24 (L) 3.87 - 5.11 MIL/uL   Hemoglobin 8.6 (L) 12.0 - 15.0 g/dL   HCT 28.0 (L) 36.0 - 46.0 %   MCV 86.4 78.0 - 100.0 fL   MCH 26.5 26.0 - 34.0 pg   MCHC 30.7 30.0 - 36.0 g/dL   RDW 20.5 (H) 11.5 - 15.5 %   Platelets 281 150 - 400 K/uL    Comment: Performed at [facility], [place] [address] [place], [place] [address]  Comprehensive metabolic panel     Status: Abnormal   Collection Time: 08/24/17  4:08 AM  Result Value Ref Range   Sodium 153 (H) 135 - 145 mmol/L   Potassium 3.5 3.5 - 5.1 mmol/L   Chloride 127 (H) 98 - 111 mmol/L   CO2 19 (L) 22 - 32 mmol/L   Glucose, Bld 225 (H) 70 - 99 mg/dL   BUN 42 (H) 8 - 23 mg/dL   Creatinine, Ser 1.56 (H) 0.44 - 1.00 mg/dL   Calcium 7.9 (L) 8.9 - 10.3 mg/dL   Total Protein 4.7 (L) 6.5 - 8.1 g/dL   Albumin 1.2 (L)  3.5 - 5.0 g/dL   AST 21 15 - 41 U/L   ALT 18 0 - 44 U/L   Alkaline Phosphatase 118 38 - 126 U/L   Total Bilirubin 0.8 0.3 - 1.2 mg/dL   GFR calc non Af Amer 31 (L) >60 mL/min   GFR calc Af Amer 36 (L) >60 mL/min    Comment: (NOTE) The eGFR has been calculated using the CKD EPI equation. This calculation has not been  validated in all clinical situations. eGFR's persistently <60 mL/min signify possible Chronic Kidney Disease.    Anion gap 7 5 - 15    Comment: Performed at [facility] [address], [place], [place] [address]  Magnesium     Status: None   Collection Time: 08/24/17  4:08 AM  Result Value Ref Range   Magnesium 1.8 1.7 - 2.4 mg/dL    Comment: Performed at [facility] [address] [place], [place] [address]  Phosphorus     Status: None   Collection Time: 08/24/17  4:08 AM  Result Value Ref Range   Phosphorus 3.5 2.5 - 4.6 mg/dL    Comment: Performed at [facility] [address] [place], [place] [address]  Glucose, capillary     Status: Abnormal   Collection Time: 08/24/17  7:36 AM  Result Value Ref Range   Glucose-Capillary 197 (H) 70 - 99 mg/dL   Comment 1 Capillary Specimen    Comment 2 Notify RN   Osmolality, urine     Status: None   Collection Time: 08/24/17  8:36 AM  Result Value Ref Range   Osmolality, Ur 426 300 - 900 mOsm/kg    Comment: Performed at [facility], [place] [address] [place], [place] [address]  Glucose, capillary     Status: Abnormal   Collection Time: 08/24/17 11:06 AM  Result Value Ref Range   Glucose-Capillary 178 (H) 70 - 99 mg/dL   Comment 1 Capillary Specimen    Comment 2 Notify RN   Glucose, capillary     Status: Abnormal   Collection Time: 08/24/17  3:52 PM  Result Value Ref Range   Glucose-Capillary 210 (H) 70 - 99 mg/dL   Comment 1 Notify RN   Basic metabolic panel     Status: Abnormal   Collection Time: 08/24/17  4:43 PM  Result Value Ref Range   Sodium 151 (H) 135 - 145 mmol/L   Potassium 3.2 (L) 3.5 - 5.1 mmol/L   Chloride 125 (H) 98 - 111 mmol/L   CO2 17 (L) 22 - 32 mmol/L   Glucose, Bld 284 (H) 70 - 99 mg/dL   BUN 42 (H) 8 - 23 mg/dL   Creatinine, Ser 1.52 (H) 0.44 - 1.00 mg/dL   Calcium 7.7 (L) 8.9 - 10.3 mg/dL   GFR calc non Af Amer 32 (L) >60 mL/min   GFR calc Af Amer 37 (L) >60 mL/min     Comment: (NOTE) The eGFR has been calculated using the CKD EPI equation. This calculation has not been validated in all clinical situations. eGFR's persistently <60 mL/min signify possible Chronic Kidney Disease.    Anion gap 9 5 - 15    Comment: Performed at [facility] [address], [place],  [address]   Dg Chest Port 1 View  Result Date: 08/23/2017 CLINICAL DATA:  Nasogastric tube, postop EXAM: PORTABLE CHEST 1 VIEW COMPARISON:  Portable exam 1952 hours compared to 08/16/2017 FINDINGS: Nasogastric tube  extends into stomach, with proximal side-port at approximately the gastroesophageal junction region. RIGHT jugular central venous catheter tip projects over RIGHT atrium unchanged, though patient is mildly kyphotic in positioning. Upper normal size of cardiac silhouette. Atherosclerotic calcification aorta. Slight pulmonary vascular congestion. Bibasilar atelectasis. Upper lungs clear. No pleural effusion or pneumothorax. IMPRESSION: Tip of nasogastric tube projects over stomach. Bibasilar atelectasis. Electronically Signed   By: Lavonia Dana M.D.   On: 08/23/2017 20:07    Review Of Systems Unable to obtain full information from patient. She speaks very little at times. Constitutional: H/O fever, chills, weight loss. Eyes: No vision change, wears glasses. No discharge or pain. Ears: No hearing loss, No tinnitus. Respiratory: Not known Cardiovascular: No chest pain, positive palpitation, leg edema. Gastrointestinal: Posiive nausea, vomiting, diarrhea, constipation. No GI bleed. No hepatitis. Genitourinary: Not known. Neurological: Not known.  Psychiatry: Not known Musculoskeletal: Positive joint pain, Right BKA. Lymphadenopathy: No lymphadenopathy. Hematology: Positive anemia.   Blood pressure 133/87, pulse (!) 114, temperature (!) 97.3 F (36.3 C), temperature source Axillary, resp. rate (!) 21, height 5' (1.524 m), weight 83.2 kg (183 lb 6.8 oz), SpO2 (!) 63 %. Body  mass index is 35.82 kg/m. General appearance: alwake, cooperative, appears stated age and mild respiratory distress Head: Normocephalic, atraumatic. Eyes: Brown eyes, pale conjunctiva, corneas clear.  Neck: No adenopathy, no carotid bruit, no JVD, supple, symmetrical, trachea midline and thyroid not enlarged. Resp: Clear to auscultation bilaterally. Cardio: Regular rate and rhythm, S1, S2 normal, II/VI systolic murmur, no click, rub or gallop GI: Soft, non-tender; bowel sounds present. no organomegaly. Extremities: 1 + edema up to thigh, no cyanosis or clubbing. Right BKA. Skin: Warm and dry.  Neurologic: Alert and oriented X 1, bed ridden.  Assessment/Plan Paroxysmal atrial fibrillation, [id number] score of 4 Anemia of blood loss and chronic disease S/P large bowel perforation and bowel surgery Right BKA Acute kidney injury Altered mental status  Agree with echocardiogram May start anticoagulation post stable from surgery and anemia.  Birdie Riddle, MD  08/24/2017, 5:35 PM

## 2017-08-24 NOTE — Progress Notes (Signed)
[facility] Progress Note  14 Days Post-Op  Subjective: CC: pt somnolent Somnolent but arouses with stimulation. Followed command for me. Making urine, having non-bloody output from colostomy. Tolerating TF.   Objective: Vital signs in last 24 hours: Temp:  [97.6 F (36.4 C)-98.2 F (36.8 C)] 97.6 F (36.4 C) (07/30 0344) Pulse Rate:  [89-111] 97 (07/30 0000) Resp:  [14-25] 19 (07/30 0000) BP: (84-142)/(46-76) 93/47 (07/30 0000) SpO2:  [95 %-100 %] 95 % (07/30 0000) Weight:  [83.2 kg (183 lb 6.8 oz)] 83.2 kg (183 lb 6.8 oz) (07/30 0408) Last BM Date: 08/23/17  Intake/Output from previous day: 07/29 0701 - 07/30 0700 In: 943.5 [I.V.:83.5; NG/GT:860] Out: 4008 [Urine:610; Stool:1065] Intake/Output this shift: No intake/output data recorded.  PE: Gen:  Somnolent, NAD Card:  Regular rate and rhythm, L pedal pulse difficult to palpate, L toes dusky, BL radial pulses 2+ Pulm:  Normal effort, clear to auscultation bilaterally Abd: Soft, non-tender, non-distended, bowel sounds present in all 4 quadrants, midline wound with some necrotic tissue in superior wound base, colostomy functioning and stoma pink, mucus fistula present Ext: edematous  Neuro: arouses to verbal/tactile stimulation, follows some simple commands  Lab Results:  Recent Labs    08/23/17 0435 08/24/17 0408  WBC 8.1 8.1  HGB 8.3* 8.6*  HCT 26.6* 28.0*  PLT 218 281   BMET Recent Labs    08/23/17 1324 08/24/17 0408  NA 153* 153*  K 3.5 3.5  CL 128* 127*  CO2 19* 19*  GLUCOSE 164* 225*  BUN 43* 42*  CREATININE 1.50* 1.56*  CALCIUM 7.7* 7.9*   PT/INR No results for input(s): LABPROT, INR in the last 72 hours. CMP     Component Value Date/Time   NA 153 (H) 08/24/2017 0408   K 3.5 08/24/2017 0408   CL 127 (H) 08/24/2017 0408   CO2 19 (L) 08/24/2017 0408   GLUCOSE 225 (H) 08/24/2017 0408   BUN 42 (H) 08/24/2017 0408   CREATININE 1.56 (H) 08/24/2017 0408   CALCIUM 7.9 (L) 08/24/2017  0408   PROT 4.7 (L) 08/24/2017 0408   ALBUMIN 1.2 (L) 08/24/2017 0408   AST 21 08/24/2017 0408   ALT 18 08/24/2017 0408   ALKPHOS 118 08/24/2017 0408   BILITOT 0.8 08/24/2017 0408   GFRNONAA 31 (L) 08/24/2017 0408   GFRAA 36 (L) 08/24/2017 0408   Lipase     Component Value Date/Time   LIPASE 70 (H) 08/09/2017 1954       Studies/Results: Dg Chest Port 1 View  Result Date: 08/23/2017 CLINICAL DATA:  Nasogastric tube, postop EXAM: PORTABLE CHEST 1 VIEW COMPARISON:  Portable exam 1952 hours compared to 08/16/2017 FINDINGS: Nasogastric tube extends into stomach, with proximal side-port at approximately the gastroesophageal junction region. RIGHT jugular central venous catheter tip projects over RIGHT atrium unchanged, though patient is mildly kyphotic in positioning. Upper normal size of cardiac silhouette. Atherosclerotic calcification aorta. Slight pulmonary vascular congestion. Bibasilar atelectasis. Upper lungs clear. No pleural effusion or pneumothorax. IMPRESSION: Tip of nasogastric tube projects over stomach. Bibasilar atelectasis. Electronically Signed   By: Lavonia Dana M.D.   On: 08/23/2017 20:07    Anti-infectives: Anti-infectives (From admission, onward)   Start     Dose/Rate Route Frequency Ordered Stop   08/18/17 1200  anidulafungin (ERAXIS) 100 mg in sodium chloride 0.9 % 100 mL IVPB     100 mg 78 mL/hr over 100 Minutes Intravenous Every 24 hours 08/18/17 0907 08/20/17 1432   08/18/17 1000  piperacillin-tazobactam (  ZOSYN) IVPB 3.375 g     3.375 g 12.5 mL/hr over 240 Minutes Intravenous Every 8 hours 08/18/17 0907 08/20/17 2250   08/13/17 1100  anidulafungin (ERAXIS) 100 mg in sodium chloride 0.9 % 100 mL IVPB  Status:  Discontinued     100 mg 78 mL/hr over 100 Minutes Intravenous Every 24 hours 08/12/17 1048 08/17/17 1308   08/12/17 1100  anidulafungin (ERAXIS) 200 mg in sodium chloride 0.9 % 200 mL IVPB     200 mg 78 mL/hr over 200 Minutes Intravenous  Once 08/12/17  1048 08/12/17 1648   08/11/17 0600  piperacillin-tazobactam (ZOSYN) IVPB 3.375 g     3.375 g 12.5 mL/hr over 240 Minutes Intravenous Every 8 hours 08/11/17 0025 08/17/17 2359   08/11/17 0115  vancomycin (VANCOCIN) IVPB 750 mg/150 ml premix     750 mg 150 mL/hr over 60 Minutes Intravenous Every 24 hours 08/11/17 0110 08/17/17 0211   08/11/17 0045  piperacillin-tazobactam (ZOSYN) IVPB 3.375 g     3.375 g 100 mL/hr over 30 Minutes Intravenous  Once 08/11/17 0033 08/11/17 0206   08/16/2017 2000  piperacillin-tazobactam (ZOSYN) IVPB 3.375 g  Status:  Discontinued     3.375 g 100 mL/hr over 30 Minutes Intravenous  Once 08/01/2017 1952 08/11/17 0025       Assessment/Plan s/p Procedure(s): EXPLORATORY LAPAROTOMY FOR FREE AIR, LEFT EXTENDED HEMICOLECTOMY, COLOSTOMY AND MUCUS FISTULA (N/A)  Perforated proximal descending colon Extended left hemicolectomy with distal transverse colostomy and mucous fistula creation, 08/11/17,Dr Kae Heller. POD#13, -TFs atgoal rate and tolerating well. -BID midline dressing changesand prn for saturation from serous drainage -WOC RN for ostomy care -cont zosyn and vanc, Eraxis added 7/18- stopped 7/23 -WBCnormal  Sepsis -secondary to #1, patientoff pressors -abx therapy- discontinued 7/26  Thrombocytopenia -plts up to 281K today, will discuss with MD if we can restart any chemical VTE  VDRF - extubated 6:30 PM 7/25 - one way extubation per family, stable  DM- SSI for now  HTN- resolved, BB on hold  AKI on CKD stage III -Creatinine1.56    Hypokalemia - K 3.5 this AM, Mag1.8 today  Severe malnutrition - tube feeds/Speech evaluation for swallowing  ABL anemia - H/H 8.6/28.0  Paroxysmal A.Fib - rate controlled  Hx of R BKA secondary to peripheral vascular disease - L toes dark, good doppler pulse, continue current care  Acute metabolic encephalopathy - multifactoral, followed simple commands this AM  Hypernatremia - sodium 153, serum  osmolality 327; free water increased, continue to monitor  FEN -NPO, IVFs, TFs at60cc/hr VTE - SCDs - Heparin held for low platelets ID -Zosyn 7/16-->Vanc 7/16-->Eraxis 7/18 -->all abx stopped 08/17/17.(Calcitonin 32) Zosyn/Eraxis restarted 7/24>> 7/26    LOS: 14 days    Judith [name] , [facility] 08/24/2017, 7:26 AM Pager: [phone] Consults: [phone] Mon-Fri 7:00 am-4:30 pm Sat-Sun 7:00 am-11:30 am

## 2017-08-24 NOTE — Progress Notes (Signed)
S   PROGRESS NOTE  Judith [name] XAJ:[id number] DOB: 02-16-1940 DOA: 08/14/2017 PCP: System, Pcp Not In  HPI/Recap of past 24 hours: Judith [name] a 77 y.o.female with past medical history significant for hypertension, paroxysmal atrial fibrillation, left foot osteomyelitis, right below the knee amputation, who presented to [facility] ED on 08/02/2017 due to altered mental status. She was found to be in septic shock from perforated proximal descending colon and pneumoperitoneum. She underwent left hemicolectomy with mobilization of the splenic flexure, end colostomy and mucous fistula by surgery. Post op she required continual intubation and vasopressor use and was on PCCM service. Currently she is extubated and off pressors and medical service consulted for management of medical issues.   Hospital course complicated by persistent encephalopathy, severe protein calorie malnutrition, dysphagia and severe physical debility.  08/24/2017: Patient seen and examined at her bedside she is somnolent and minimally follows commands.  She does not appear to be in acute distress.      Assessment/Plan: Principal Problem:   Perforation of colon s/p left colectomy/ostomy/mucus fistula 08/11/2017 Active Problems:   Acute respiratory failure with hypoxia (HCC)   S/P exploratory laparotomy   Septic shock (HCC)   Acute respiratory failure (HCC)   Atrial fibrillation (HCC)   Goals of care, counseling/discussion   DNR (do not resuscitate)   Intra-abdominal free air of unknown etiology   Severe protein-calorie malnutrition ([place])   POD #13 status post exploratory laparotomy for free air, left extended hemicolectomy, colostomy and mucous fistula Completed course of IV Zosyn and IV vancomycin General surgery and wound care specialist for ostomy care  Persistent acute metabolic encephalopathy most likely multifactorial secondary to acute illness versus others Nonverbal and minimally responds/follows commands Unclear  what her baseline is Reorient as needed Neurochecks every 4 hours  Hypervolemic hypernatremia Serum osmolality and urine osmolality are still pending Sodium 153 Increase free water flush to 250 every 6 hours Repeat BMP this afternoon  Paroxysmal A. Fib Not on any rate control or rhythm control agents In A. fib on 08/15/2017 Repeated EKG in sinus rhythm ChadsVasc score of 6 Obtain 2D echo and cardiology consult  Hypomagnesemia Magnesium 1.8 Repleted with 2 g IV magnesium once  Iron deficiency anemia/acute blood loss Start ferrous sulfate supplement GI tube feeding twice daily Hemoglobin is stable at 8.6 from 8.3 yesterday Repeat CBC in the morning  Thrombocytopenia with concern for HIT, resolved Thrombocytopenia have resolved platelet 281K on 08/24/2017 HIT antibodies negative Will defer to general surgery who is primary to start chemical DVT prophylaxis subcu heparin 3 times daily  Hypokalemia, resolved improving severe protein calorie malnutrition Albumin 1.2 from less than 1.0 severe protein calorie malnutrition Dietitian following with recommendations for nutritional needs and replacements  Worsening generalized weakness/severe physical debility  Suspect multifactorial secondary to acute illness versus hypokalemia versus others Replete electrolytes as indicated Nutritional supplement via NG tube PT OT to assess once stable  Worsening AKI on CKD 3, suspect prerenal from dehydration Baseline creatinine 1.4 with GFR of 40 Creatinine 1.56 from 1.50 Avoid nephrotoxic agents/hypotension/dehydration AKI on CKD 3  Acute hypoxic respiratory failure postop requiring mechanical ventilation from 7/16 through 7/25 Continue O2 supplementation via [place] to maintain O2 saturation between 88 and 92% Continue bronchodilators as needed  Paroxysmal A. fib Rate controlled Continue IV Lopressor as needed  PVD status post right below the knee amputation PT to assess when stable  Goals  of care Palliative care team following to establish goals of care DNR  Code Status: DNR  Family Communication: None at bedside   SIGNIFICANT EVENTS: 7/16 > admit, to OR for ex-lap. 7/17 > remains intubated after hemicolectomy and colostomy placement 7/18 > worsening labs overnight, poor UOP, HCO3 infusion started 7/19 > improved ABG, improved lactic acid 7/20 >atrial fibrillation with rate into 130s, norepinephrine changed to phenylephrine  7/21 > fentanyl weaned down, vasopressin stopped, intermittently in atrial fibrillation. Potassium replaced overnight, intermittent a fib with drop in pressure during these episodes, responded well to Lopressor 5 mg 7/22 > decreased fentanyl from 50 mcg to 25 mcg with no improvement in neurological status, palliative care consult placed 7/23 > antibiotics course completed 7/24 > GOC meeting with family, changed status to DNR, would like patient extubated whenever CCM thinks it will have best chance for success 7/25 > Extubated successfully, no longer on pressors or sedation. 7/27 > medical service consulted for medical issues.    ANTIBIOTICS: Vanc 7/17 > 7/23 Zosyn 7/17 >7/26 Eraxis 7/18 >7/26    DVT prophylaxis:  SCDs Defer chemical DVT prophylaxis to general surgery   Objective: Vitals:   08/24/17 0700 08/24/17 0751 08/24/17 0900 08/24/17 1113  BP: (!) 96/45  (!) 110/41   Pulse: (!) 122  84   Resp: (!) 26  (!) 24   Temp:  98 F (36.7 C)  98.5 F (36.9 C)  TempSrc:  Axillary  Axillary  SpO2: (!) 76%  94%   Weight:      Height:        Intake/Output Summary (Last 24 hours) at 08/24/2017 1212 Last data filed at 08/24/2017 1155 Gross per 24 hour  Intake 2256.86 ml  Output 2505 ml  Net -248.14 ml   Filed Weights   08/20/17 0416 08/23/17 0434 08/24/17 0408  Weight: 91 kg (200 lb 9.9 oz) 86.7 kg (191 lb 2.2 oz) 83.2 kg (183 lb 6.8 oz)    Exam:  . General: 77 y.o. year-old female obese no acute distress.  Contract in  place.  Somnolent but easily arousable to voices and minimally follows command.   . Cardiovascular: Regular rate and rhythm with no rubs or gallops.  No thyromegaly or JVD noted. Marland Kitchen Respiratory: Mild diffuse rales with no wheezes.  Poor inspiratory effort.   . Abdomen: Soft nontender nondistended with normal bowel sounds x4 quadrants. . Musculoskeletal: Right below the knee amputation.  Left foot in a boot. Marland Kitchen Psychiatry: Unable to assess mood due to altered mental status.  Data Reviewed: CBC: Recent Labs  Lab 08/20/17 0506 08/21/17 0453 08/22/17 0410 08/23/17 0435 08/24/17 0408  WBC 7.5 7.8 7.2 8.1 8.1  HGB 8.7* 8.7* 8.4* 8.3* 8.6*  HCT 26.8* 26.3* 26.6* 26.6* 28.0*  MCV 82.2 81.4 84.7 85.5 86.4  PLT 73* 116* 165 218 742   Basic Metabolic Panel: Recent Labs  Lab 08/18/17 0412 08/20/17 0506  08/21/17 0453 08/22/17 0410 08/23/17 0435 08/23/17 1324 08/24/17 0408  NA  --  144   < > 144 147* 151* 153* 153*  K  --  2.8*   < > 3.3* 3.2* 3.3* 3.5 3.5  CL  --  113*   < > 115* 120* 123* 128* 127*  CO2  --  22   < > 18* 20* 19* 19* 19*  GLUCOSE  --  172*   < > 223* 225* 198* 164* 225*  BUN  --  51*   < > 49* 46* 43* 43* 42*  CREATININE  --  1.63*   < > 1.60* 1.57*  1.54* 1.50* 1.56*  CALCIUM  --  7.0*   < > 7.3* 7.3* 7.7* 7.7* 7.9*  MG 1.6* 1.5*  --  1.8  --  2.0  --  1.8  PHOS 2.8  --   --   --   --   --   --  3.5   < > = values in this interval not displayed.   GFR: Estimated Creatinine Clearance: 28.4 mL/min (A) (by C-G formula based on SCr of 1.56 mg/dL (H)). Liver Function Tests: Recent Labs  Lab 08/20/17 0920 08/24/17 0408  AST 20 21  ALT 11 18  ALKPHOS 123 118  BILITOT 0.9 0.8  PROT 3.6* 4.7*  ALBUMIN <1.0* 1.2*   No results for input(s): LIPASE, AMYLASE in the last 168 hours. No results for input(s): AMMONIA in the last 168 hours. Coagulation Profile: No results for input(s): INR, PROTIME in the last 168 hours. Cardiac Enzymes: No results for input(s):  CKTOTAL, CKMB, CKMBINDEX, TROPONINI in the last 168 hours. BNP (last 3 results) No results for input(s): PROBNP in the last 8760 hours. HbA1C: Recent Labs    08/22/17 0410  HGBA1C 8.1*   CBG: Recent Labs  Lab 08/23/17 1937 08/23/17 2328 08/24/17 0343 08/24/17 0736 08/24/17 1106  GLUCAP 155* 148* 168* 197* 178*   Lipid Profile: No results for input(s): CHOL, HDL, LDLCALC, TRIG, CHOLHDL, LDLDIRECT in the last 72 hours. Thyroid Function Tests: No results for input(s): TSH, T4TOTAL, FREET4, T3FREE, THYROIDAB in the last 72 hours. Anemia Panel: Recent Labs    08/24/17 0408  VITAMINB12 1,451*  FERRITIN 198  TIBC 151*  IRON 20*  RETICCTPCT 1.4   Urine analysis:    Component Value Date/Time   COLORURINE AMBER (A) 08/11/2017 0521   APPEARANCEUR CLOUDY (A) 08/11/2017 0521   LABSPEC 1.019 08/11/2017 0521   PHURINE 5.0 08/11/2017 0521   GLUCOSEU NEGATIVE 08/11/2017 0521   HGBUR SMALL (A) 08/11/2017 0521   BILIRUBINUR SMALL (A) 08/11/2017 0521   KETONESUR NEGATIVE 08/11/2017 0521   PROTEINUR 30 (A) 08/11/2017 0521   UROBILINOGEN 0.2 10/17/2014 0849   NITRITE NEGATIVE 08/11/2017 0521   LEUKOCYTESUR LARGE (A) 08/11/2017 0521   Sepsis Labs: @LABRCNTIP (procalcitonin:4,lacticidven:4)  )No results found for this or any previous visit (from the past 240 hour(s)).    Studies: Dg Chest Port 1 View  Result Date: 08/23/2017 CLINICAL DATA:  Nasogastric tube, postop EXAM: PORTABLE CHEST 1 VIEW COMPARISON:  Portable exam 1952 hours compared to 08/16/2017 FINDINGS: Nasogastric tube extends into stomach, with proximal side-port at approximately the gastroesophageal junction region. RIGHT jugular central venous catheter tip projects over RIGHT atrium unchanged, though patient is mildly kyphotic in positioning. Upper normal size of cardiac silhouette. Atherosclerotic calcification aorta. Slight pulmonary vascular congestion. Bibasilar atelectasis. Upper lungs clear. No pleural effusion or  pneumothorax. IMPRESSION: Tip of nasogastric tube projects over stomach. Bibasilar atelectasis. Electronically Signed   By: Lavonia Dana M.D.   On: 08/23/2017 20:07    Scheduled Meds: . chlorhexidine  15 mL Mouth Rinse BID  . ferrous sulfate  300 mg Per Tube BID WC  . free water  200 mL Per Tube QID  . insulin aspart  0-15 Units Subcutaneous Q4H  . mouth rinse  15 mL Mouth Rinse q12n4p  . pantoprazole sodium  40 mg Per Tube Daily    Continuous Infusions: . sodium chloride    . feeding supplement (VITAL AF 1.2 CAL) 1,500 mL (08/24/17 0900)     LOS: 14 days  Kayleen Memos, MD [facility] Pager [phone]  If 7PM-7AM, please contact night-coverage [email or web address] Password [email or web address] 08/24/2017, 12:12 PM

## 2017-08-24 NOTE — Consult Note (Signed)
[place] Nurse wound consult note Eakin pouch change to LLQ mucus fistula changed in [facility] 2M12.  No family present. MF Eakin pouch changed.  A no leak seal obtained.  Draining thin serosanginous fluid.  Ostomy pouch changes performed by primary RNs.  Supplies for ostomy and Eakin in room.  Ostomy producing high output, thin, yellow effluent.  Patient looking around during care.  No skin breakdown observed under Eakin pouch.  Barrier ring used to fill creases to hopefully facilitate a long lasting seal. Val Riles, RN, MSN, CWOCN, CNS-BC, pager [phone]

## 2017-08-25 ENCOUNTER — Inpatient Hospital Stay (HOSPITAL_COMMUNITY): Payer: Medicare Other

## 2017-08-25 LAB — CBC
HCT: 28.7 % — ABNORMAL LOW (ref 36.0–46.0)
HEMATOCRIT: 24.9 % — AB (ref 36.0–46.0)
Hemoglobin: 7.6 g/dL — ABNORMAL LOW (ref 12.0–15.0)
Hemoglobin: 9.1 g/dL — ABNORMAL LOW (ref 12.0–15.0)
MCH: 26.6 pg (ref 26.0–34.0)
MCH: 27.2 pg (ref 26.0–34.0)
MCHC: 30.5 g/dL (ref 30.0–36.0)
MCHC: 31.7 g/dL (ref 30.0–36.0)
MCV: 87.1 fL (ref 78.0–100.0)
MCV: 85.9 fL (ref 78.0–100.0)
PLATELETS: 313 10*3/uL (ref 150–400)
PLATELETS: 301 10*3/uL (ref 150–400)
RBC: 2.86 MIL/uL — AB (ref 3.87–5.11)
RBC: 3.34 MIL/uL — AB (ref 3.87–5.11)
RDW: 20.5 % — ABNORMAL HIGH (ref 11.5–15.5)
RDW: 19.7 % — ABNORMAL HIGH (ref 11.5–15.5)
WBC: 7.5 10*3/uL (ref 4.0–10.5)
WBC: 7.7 10*3/uL (ref 4.0–10.5)

## 2017-08-25 LAB — COMPREHENSIVE METABOLIC PANEL
ALT: 14 U/L (ref 0–44)
AST: 16 U/L (ref 15–41)
Albumin: 1.1 g/dL — ABNORMAL LOW (ref 3.5–5.0)
Alkaline Phosphatase: 95 U/L (ref 38–126)
Anion gap: 6 (ref 5–15)
BILIRUBIN TOTAL: 0.6 mg/dL (ref 0.3–1.2)
BUN: 40 mg/dL — AB (ref 8–23)
CHLORIDE: 126 mmol/L — AB (ref 98–111)
CO2: 18 mmol/L — ABNORMAL LOW (ref 22–32)
CREATININE: 1.49 mg/dL — AB (ref 0.44–1.00)
Calcium: 7.7 mg/dL — ABNORMAL LOW (ref 8.9–10.3)
GFR, EST AFRICAN AMERICAN: 38 mL/min — AB (ref >60)
GFR, EST NON AFRICAN AMERICAN: 32 mL/min — AB (ref >60)
Glucose, Bld: 216 mg/dL — ABNORMAL HIGH (ref 70–99)
Potassium: 2.9 mmol/L — ABNORMAL LOW (ref 3.5–5.1)
Sodium: 150 mmol/L — ABNORMAL HIGH (ref 135–145)
TOTAL PROTEIN: 4.3 g/dL — AB (ref 6.5–8.1)

## 2017-08-25 LAB — GLUCOSE, CAPILLARY
GLUCOSE-CAPILLARY: 114 mg/dL — AB (ref 70–99)
Glucose-Capillary: 104 mg/dL — ABNORMAL HIGH (ref 70–99)
Glucose-Capillary: 155 mg/dL — ABNORMAL HIGH (ref 70–99)
Glucose-Capillary: 174 mg/dL — ABNORMAL HIGH (ref 70–99)
Glucose-Capillary: 177 mg/dL — ABNORMAL HIGH (ref 70–99)
Glucose-Capillary: 195 mg/dL — ABNORMAL HIGH (ref 70–99)
Glucose-Capillary: 203 mg/dL — ABNORMAL HIGH (ref 70–99)

## 2017-08-25 LAB — ECHOCARDIOGRAM COMPLETE
Height: 60 in
Weight: 2913.6 oz

## 2017-08-25 LAB — FOLATE RBC
Folate, Hemolysate: 337.9 ng/mL
Folate, RBC: 1266 ng/mL (ref >498)
Hematocrit: 26.7 % — ABNORMAL LOW (ref 34.0–46.6)

## 2017-08-25 LAB — PREPARE RBC (CROSSMATCH)

## 2017-08-25 LAB — MAGNESIUM: MAGNESIUM: 2.2 mg/dL (ref 1.7–2.4)

## 2017-08-25 LAB — PHOSPHORUS: PHOSPHORUS: 3.3 mg/dL (ref 2.5–4.6)

## 2017-08-25 MED ORDER — SODIUM CHLORIDE 0.9% IV SOLUTION: Freq: Once | INTRAVENOUS | Status: AC

## 2017-08-25 MED ORDER — SODIUM CHLORIDE 0.9% IV SOLUTION
Freq: Once | INTRAVENOUS | Status: AC
  Administered 2017-08-25: 09:00:00 via INTRAVENOUS

## 2017-08-25 MED ORDER — POTASSIUM CHLORIDE 20 MEQ/15ML (10%) PO SOLN
40.0000 meq | Freq: Three times a day (TID) | ORAL | Status: AC
  Administered 2017-08-25 (×3): 40 meq
  Filled 2017-08-25 (×3): qty 30

## 2017-08-25 MED ORDER — INSULIN GLARGINE 100 UNIT/ML ~~LOC~~ SOLN
5.0000 [IU] | Freq: Every day | SUBCUTANEOUS | Status: DC
  Administered 2017-08-25 – 2017-08-27 (×3): 5 [IU] via SUBCUTANEOUS
  Filled 2017-08-25 (×4): qty 0.05

## 2017-08-25 NOTE — Progress Notes (Signed)
S   PROGRESS NOTE  Judith [name]:[id number] DOB: 10-08-1940 DOA: 08/18/2017 PCP: System, Pcp Not In  HPI/Recap of past 24 hours: Judith [name] a 77 y.o.female with past medical history significant for hypertension, paroxysmal atrial fibrillation, left foot osteomyelitis, right below the knee amputation, who presented to [facility] ED on 08/12/2017 due to altered mental status. She was found to be in septic shock from perforated proximal descending colon and pneumoperitoneum. She underwent left hemicolectomy with mobilization of the splenic flexure, end colostomy and mucous fistula by surgery. Post op she required continual intubation and vasopressor use and was on PCCM service. Currently she is extubated and off pressors and medical service consulted for management of medical issues.   Hospital course complicated by persistent encephalopathy, severe protein calorie malnutrition, dysphagia and severe physical debility.  08/24/2017: Patient seen and examined at her bedside she is somnolent and minimally follows commands.  She does not appear to be in acute distress.  08/25/2017: Patient seen and examined at her bedside.  She is more alert today and tracks with her eyes in the room.  Still minimally verbal.  Does not appear to be in acute distress.      Assessment/Plan: Principal Problem:   Perforation of colon s/p left colectomy/ostomy/mucus fistula 08/11/2017 Active Problems:   Acute respiratory failure with hypoxia (HCC)   S/P exploratory laparotomy   Septic shock (HCC)   Acute respiratory failure (HCC)   Atrial fibrillation (HCC)   Goals of care, counseling/discussion   DNR (do not resuscitate)   Intra-abdominal free air of unknown etiology   Severe protein-calorie malnutrition ([place])   POD #14 status post exploratory laparotomy for free air, left extended hemicolectomy, colostomy and mucous fistula Completed course of IV Zosyn and IV vancomycin General surgery and wound care specialist for  ostomy care  Acute metabolic encephalopathy most likely multifactorial secondary to acute illness versus others More alert this morning but still minimally verbal and minimally following commands Unclear what her baseline is Reorient as needed Neurochecks every 4 hours  Acute blood loss anemia/iron deficiency anemia Drop in hemoglobin this morning from 8.6 to 7.6 2 unit PRBC ordered to be transfused On iron supplement via cortrack tube No sign of overt bleeding Repeat CBC in the morning  Hypokalemia suspect secondary to GI losses Potassium 2.9 Repleted with iron supplement 40 mEq x 3 doses via cortrack tube Repeat BMP in the morning Magnesium is 2.2  Hypervolemic hypernatremia, improving Sodium 150 from 153 Continue free water flushes at 250 cc every 6 hours Third spacing throughout with dependent edema Avoid IV fluid hydration if possible Repeat BMP in the morning  Paroxysmal A. Fib Not on any rate control or rhythm control agents In A. fib on 08/15/2017 Repeated EKG in sinus rhythm ChadsVasc score of 6 Cardiology following 2D echo done on 08/25/2017 and results are pending  Hypomagnesemia, resolved Repleted with 2 g IV magnesium once  Thrombocytopenia with concern for HIT, resolved Thrombocytopenia have resolved platelet 281K on 08/24/2017 HIT antibodies negative  Generalized weakness/severe physical debility  Suspect multifactorial secondary to acute illness versus hypokalemia versus others Replete electrolytes as indicated Nutritional supplement via NG tube PT OT to assess once stable  AKI on CKD 3, suspect prerenal from dehydration Creatinine is improving from 1.52 to 1.49 Baseline creatinine 1.4 with GFR of 40 Continue to avoid nephrotoxic agents and repeat BMP in the morning  Acute hypoxic respiratory failure postop requiring mechanical ventilation from 7/16 through 7/25 Continue O2 supplementation via  [place] as needed to maintain O2 saturation between 88 and  92% Continue bronchodilators as needed  Severe PVD status post right below the knee amputation PT to assess when stable  Goals of care Palliative care team following to establish goals of care DNR    Code Status: DNR  Family Communication: None at bedside   SIGNIFICANT EVENTS: 7/16 > admit, to OR for ex-lap. 7/17 > remains intubated after hemicolectomy and colostomy placement 7/18 > worsening labs overnight, poor UOP, HCO3 infusion started 7/19 > improved ABG, improved lactic acid 7/20 >atrial fibrillation with rate into 130s, norepinephrine changed to phenylephrine  7/21 > fentanyl weaned down, vasopressin stopped, intermittently in atrial fibrillation. Potassium replaced overnight, intermittent a fib with drop in pressure during these episodes, responded well to Lopressor 5 mg 7/22 > decreased fentanyl from 50 mcg to 25 mcg with no improvement in neurological status, palliative care consult placed 7/23 > antibiotics course completed 7/24 > GOC meeting with family, changed status to DNR, would like patient extubated whenever CCM thinks it will have best chance for success 7/25 > Extubated successfully, no longer on pressors or sedation. 7/27 > medical service consulted for medical issues.    ANTIBIOTICS: Vanc 7/17 > 7/23 Zosyn 7/17 >7/26 Eraxis 7/18 >7/26    DVT prophylaxis: Heparin subcu 3 times daily   Objective: Vitals:   08/25/17 0930 08/25/17 1000 08/25/17 1045 08/25/17 1100  BP: (!) 109/43 (!) 127/50  (!) 129/54  Pulse: 93 94 94 89  Resp: 20 17 (!) 26 18  Temp: (!) 97.4 F (36.3 C)   98.1 F (36.7 C)  TempSrc: Oral   Oral  SpO2: 96% 96% 96% 96%  Weight:      Height:        Intake/Output Summary (Last 24 hours) at 08/25/2017 1231 Last data filed at 08/25/2017 1045 Gross per 24 hour  Intake 2422.05 ml  Output 1625 ml  Net 797.05 ml   Filed Weights   08/23/17 0434 08/24/17 0408 08/25/17 0455  Weight: 86.7 kg (191 lb 2.2 oz) 83.2 kg (183 lb 6.8  oz) 82.6 kg (182 lb 1.6 oz)    Exam:  . General: 77 y.o. year-old female obese in no acute distress.  More alert today and tracking with her eyes.  Although she is minimally verbal and minimally follows commands.   . Cardiovascular: Regular rate and rhythm with no rubs or gallops.  No thyromegaly or JVD noted.  Third spacing with dependent edema throughout. Marland Kitchen Respiratory: Mild diffuse rales with no wheezes.  Poor inspiratory effort.   . Abdomen: Soft nontender nondistended with normal bowel sounds x4 quadrants. . Musculoskeletal: Right below the knee amputation.  Left foot in a boot. Marland Kitchen Psychiatry: Unable to assess mood due to altered mental status.  Data Reviewed: CBC: Recent Labs  Lab 08/21/17 0453 08/22/17 0410 08/23/17 0435 08/24/17 0408 08/25/17 0503  WBC 7.8 7.2 8.1 8.1 7.5  HGB 8.7* 8.4* 8.3* 8.6* 7.6*  HCT 26.3* 26.6* 26.6* 28.0* 24.9*  MCV 81.4 84.7 85.5 86.4 87.1  PLT 116* 165 218 281 287   Basic Metabolic Panel: Recent Labs  Lab 08/20/17 0506  08/21/17 0453  08/23/17 0435 08/23/17 1324 08/24/17 0408 08/24/17 1643 08/25/17 0503  NA 144   < > 144   < > 151* 153* 153* 151* 150*  K 2.8*   < > 3.3*   < > 3.3* 3.5 3.5 3.2* 2.9*  CL 113*   < > 115*   < >  123* 128* 127* 125* 126*  CO2 22   < > 18*   < > 19* 19* 19* 17* 18*  GLUCOSE 172*   < > 223*   < > 198* 164* 225* 284* 216*  BUN 51*   < > 49*   < > 43* 43* 42* 42* 40*  CREATININE 1.63*   < > 1.60*   < > 1.54* 1.50* 1.56* 1.52* 1.49*  CALCIUM 7.0*   < > 7.3*   < > 7.7* 7.7* 7.9* 7.7* 7.7*  MG 1.5*  --  1.8  --  2.0  --  1.8  --  2.2  PHOS  --   --   --   --   --   --  3.5  --  3.3   < > = values in this interval not displayed.   GFR: Estimated Creatinine Clearance: 29.6 mL/min (A) (by C-G formula based on SCr of 1.49 mg/dL (H)). Liver Function Tests: Recent Labs  Lab 08/20/17 0920 08/24/17 0408 08/25/17 0503  AST 20 21 16   ALT 11 18 14   ALKPHOS 123 118 95  BILITOT 0.9 0.8 0.6  PROT 3.6* 4.7* 4.3*    ALBUMIN <1.0* 1.2* 1.1*   No results for input(s): LIPASE, AMYLASE in the last 168 hours. No results for input(s): AMMONIA in the last 168 hours. Coagulation Profile: No results for input(s): INR, PROTIME in the last 168 hours. Cardiac Enzymes: No results for input(s): CKTOTAL, CKMB, CKMBINDEX, TROPONINI in the last 168 hours. BNP (last 3 results) No results for input(s): PROBNP in the last 8760 hours. HbA1C: No results for input(s): HGBA1C in the last 72 hours. CBG: Recent Labs  Lab 08/24/17 1552 08/24/17 1954 08/25/17 0007 08/25/17 0354 08/25/17 0738  GLUCAP 210* 221* 195* 174* 203*   Lipid Profile: No results for input(s): CHOL, HDL, LDLCALC, TRIG, CHOLHDL, LDLDIRECT in the last 72 hours. Thyroid Function Tests: No results for input(s): TSH, T4TOTAL, FREET4, T3FREE, THYROIDAB in the last 72 hours. Anemia Panel: Recent Labs    08/24/17 0408  VITAMINB12 1,451*  FERRITIN 198  TIBC 151*  IRON 20*  RETICCTPCT 1.4   Urine analysis:    Component Value Date/Time   COLORURINE AMBER (A) 08/11/2017 0521   APPEARANCEUR CLOUDY (A) 08/11/2017 0521   LABSPEC 1.019 08/11/2017 0521   PHURINE 5.0 08/11/2017 0521   GLUCOSEU NEGATIVE 08/11/2017 0521   HGBUR SMALL (A) 08/11/2017 0521   BILIRUBINUR SMALL (A) 08/11/2017 0521   KETONESUR NEGATIVE 08/11/2017 0521   PROTEINUR 30 (A) 08/11/2017 0521   UROBILINOGEN 0.2 10/17/2014 0849   NITRITE NEGATIVE 08/11/2017 0521   LEUKOCYTESUR LARGE (A) 08/11/2017 0521   Sepsis Labs: @LABRCNTIP (procalcitonin:4,lacticidven:4)  )No results found for this or any previous visit (from the past 240 hour(s)).    Studies: No results found.  Scheduled Meds: . chlorhexidine  15 mL Mouth Rinse BID  . ferrous sulfate  300 mg Per Tube BID WC  . free water  250 mL Per Tube QID  . heparin injection (subcutaneous)  5,000 Units Subcutaneous Q8H  . insulin aspart  0-15 Units Subcutaneous Q4H  . insulin glargine  5 Units Subcutaneous Daily  . mouth  rinse  15 mL Mouth Rinse q12n4p  . metoprolol tartrate  12.5 mg Oral BID  . pantoprazole sodium  40 mg Per Tube Daily  . potassium chloride  40 mEq Per Tube TID    Continuous Infusions: . sodium chloride 10 mL/hr at 08/25/17 1000  . feeding supplement (VITAL  AF 1.2 CAL) 60 mL/hr at 08/25/17 0800     LOS: 15 days     Kayleen Memos, MD [facility] Pager [phone]  If 7PM-7AM, please contact night-coverage [email or web address] Password [facility] 08/25/2017, 12:31 PM

## 2017-08-25 NOTE — Progress Notes (Signed)
Pt transfer from 89m to 6N32. Pt alert. Not in any sign of distress. VSS. Midline incision CDI. Colostomy and mucous pouch are intact. Pressure sore stage 2 on sacrum area noted. Foley in place. 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 Old right BKA. NGT with tube feeding running. RIght IJ CVL. Will continue to monitor pt.

## 2017-08-25 NOTE — Progress Notes (Signed)
  Speech Language Pathology Treatment: Dysphagia  Patient Details Name: Judith [name] MRN: [id number] DOB: Apr 17, 1940 Today's Date: 08/25/2017 Time: 1550-1600 SLP Time Calculation (min) (ACUTE ONLY): 10 min  Assessment / Plan / Recommendation Clinical Impression  Pt demonstrating improved alertness; asking to eat. Demonstrates improved attention and anticipation of bolus, improved effort, active manipulation of applesauce.  Thin liquids evoke weak throat clearing; voice remains hoarse with low volume after prolonged oral intubation.  Pt is ready for an instrumental swallow study to determine nature of deficits and safest diet.  For tonight, allow applesauce/pudding from floor stock when pt requests.  Plan for MBS tomorrow - it would be best to pull large bore NG prior to study to maximize pt's performance.  D/W RN. Will follow.  HPI HPI: 77 y.o. female  with past medical history of IDDM, charcot's joint, dementia, anxiety, proctocolitis, PUD, right BKA, asthma, and arthritis who was admitted on 08/18/2017 with septic shock.  She was found to have a bowel perforation and was taken to the OR.  She received a hemicolectomy with colostomy and mucous fistula. Intubated 08/11/17 with one-way extubation on 08/19/17. Per palliative, prognosis <2 weeks in setting of hypotensive with Afib, severely deconditioned, malnourished, demented, with a large abdominal wound. No tracheostomy, no PEG.       SLP Plan  Continue with current plan of care       Recommendations                   Oral Care Recommendations: Oral care QID SLP Visit Diagnosis: Dysphagia, unspecified (R13.10) Plan: Continue with current plan of care       GO              Mellonie Guess L. Tivis Ringer, [place] CCC/SLP Pager [phone]   Judith [name] 08/25/2017, 4:12 PM

## 2017-08-25 NOTE — Progress Notes (Addendum)
Ref: System, Pcp Not In   Subjective:  Awake. Sinus rhythm. Echocardiogram with preserved LV systolic function with mild MR , AI and moderate TR. Moderate pulmonary hypertension. Afebrile.   Objective:  Vital Signs in the last 24 hours: Temp:  [97.4 F (36.3 C)-98.5 F (36.9 C)] 98.1 F (36.7 C) (07/31 1537) Pulse Rate:  [73-105] 105 (07/31 1700) Cardiac Rhythm: Normal sinus rhythm (07/31 0800) Resp:  [17-32] 19 (07/31 1700) BP: (93-129)/(40-69) 118/69 (07/31 1700) SpO2:  [94 %-98 %] 97 % (07/31 1700) FiO2 (%):  [30 %] 30 % (07/30 2000) Weight:  [82.6 kg (182 lb 1.6 oz)] 82.6 kg (182 lb 1.6 oz) (07/31 0455)  Physical Exam: BP Readings from Last 1 Encounters:  08/25/17 118/69     Wt Readings from Last 1 Encounters:  08/25/17 82.6 kg (182 lb 1.6 oz)    Weight change: -0.6 kg (-1 lb 5.2 oz) Body mass index is 35.56 kg/m. HEENT: [place]/AT, Eyes-Brown, Conjunctiva-Pale pink, Sclera-Non-icteric Neck: No JVD, No bruit, Trachea midline. Lungs:  Clear, Bilateral. Cardiac:  Regular rhythm, normal S1 and S2, no S3. II/VI systolic murmur. Abdomen:  Soft, non-tender. BS present. Extremities:  1 + edema present. No cyanosis. No clubbing. CNS: AxOx1, Cranial nerves grossly intact. Right BKA.  Skin: Warm and dry.   Intake/Output from previous day: 07/30 0701 - 07/31 0700 In: 2539.9 [I.V.:199.9; NG/GT:2290; IV Piggyback:50] Out: 2220 [Urine:710; Stool:1510]    Lab Results: BMET    Component Value Date/Time   NA 150 (H) 08/25/2017 0503   NA 151 (H) 08/24/2017 1643   NA 153 (H) 08/24/2017 0408   K 2.9 (L) 08/25/2017 0503   K 3.2 (L) 08/24/2017 1643   K 3.5 08/24/2017 0408   CL 126 (H) 08/25/2017 0503   CL 125 (H) 08/24/2017 1643   CL 127 (H) 08/24/2017 0408   CO2 18 (L) 08/25/2017 0503   CO2 17 (L) 08/24/2017 1643   CO2 19 (L) 08/24/2017 0408   GLUCOSE 216 (H) 08/25/2017 0503   GLUCOSE 284 (H) 08/24/2017 1643   GLUCOSE 225 (H) 08/24/2017 0408   BUN 40 (H) 08/25/2017 0503   BUN 42 (H) 08/24/2017 1643   BUN 42 (H) 08/24/2017 0408   CREATININE 1.49 (H) 08/25/2017 0503   CREATININE 1.52 (H) 08/24/2017 1643   CREATININE 1.56 (H) 08/24/2017 0408   CALCIUM 7.7 (L) 08/25/2017 0503   CALCIUM 7.7 (L) 08/24/2017 1643   CALCIUM 7.9 (L) 08/24/2017 0408   GFRNONAA 32 (L) 08/25/2017 0503   GFRNONAA 32 (L) 08/24/2017 1643   GFRNONAA 31 (L) 08/24/2017 0408   GFRAA 38 (L) 08/25/2017 0503   GFRAA 37 (L) 08/24/2017 1643   GFRAA 36 (L) 08/24/2017 0408   CBC    Component Value Date/Time   WBC 7.7 08/25/2017 1245   RBC 3.34 (L) 08/25/2017 1245   HGB 9.1 (L) 08/25/2017 1245   HCT 28.7 (L) 08/25/2017 1245   HCT 26.7 (L) 08/24/2017 0400   PLT 301 08/25/2017 1245   MCV 85.9 08/25/2017 1245   MCH 27.2 08/25/2017 1245   MCHC 31.7 08/25/2017 1245   RDW 19.7 (H) 08/25/2017 1245   LYMPHSABS 2.4 08/22/2017 1946   MONOABS 0.1 08/12/2017 1946   EOSABS 0.0 08/22/2017 1946   BASOSABS 0.1 07/26/2017 1946   HEPATIC Function Panel Recent Labs    08/20/17 0920 08/24/17 0408 08/25/17 0503  PROT 3.6* 4.7* 4.3*   HEMOGLOBIN A1C No components found for: HGA1C,  MPG CARDIAC ENZYMES Lab Results  Component Value  Date   CKTOTAL 93 04/28/2014   CKMB 1.6 11/27/2010   BNP No results for input(s): PROBNP in the last 8760 hours. TSH Recent Labs    04/23/17 2215  TSH 0.543   CHOLESTEROL No results for input(s): CHOL in the last 8760 hours.  Scheduled Meds: . chlorhexidine  15 mL Mouth Rinse BID  . ferrous sulfate  300 mg Per Tube BID WC  . free water  250 mL Per Tube QID  . heparin injection (subcutaneous)  5,000 Units Subcutaneous Q8H  . insulin aspart  0-15 Units Subcutaneous Q4H  . insulin glargine  5 Units Subcutaneous Daily  . mouth rinse  15 mL Mouth Rinse q12n4p  . metoprolol tartrate  12.5 mg Oral BID  . pantoprazole sodium  40 mg Per Tube Daily   Continuous Infusions: . sodium chloride 10 mL/hr at 08/25/17 1700  . feeding supplement (VITAL AF 1.2 CAL) 60  mL/hr at 08/25/17 1700   PRN Meds:.acetaminophen (TYLENOL) oral liquid 160 mg/5 mL, albuterol, hydroxypropyl methylcellulose / hypromellose, midazolam, sodium chloride  Assessment/Plan: Paroxysmal atrial fibrillation S/P large bowel perforation and surgery Anemia of blood loss and chronic disease Right BKA Acute kidney injury Altered mental status Severe hypoalbuminemia  Continue medical treatment.   LOS: 15 days    [name]  MD  08/25/2017, 6:13 PM

## 2017-08-25 NOTE — Progress Notes (Signed)
Daily Progress Note   Patient Name: Judith [name]       Date: 08/25/2017 DOB: 04-18-1940  Age: 77 y.o. MRN#: [id number] Attending Physician: Nolon Nations, MD Primary Care Physician: System, Pcp Not In Admit Date: 08/16/2017  Reason for Consultation/Follow-up: Establishing goals of care and Psychosocial/spiritual support  Subjective: Patient speaks a couple of words but is difficult to understand.  She ate a container of applesauce and cleared her mouth after each bite.  Before I leave she stated "I'm cold".  Patient Profile/HPI:  77 y.o. female  with past medical history of IDDM, charcot's joint, dementia, anxiety, proctocolitis, PUD, right BKA, asthma, and arthritis who was admitted on 08/16/2017 with septic shock.  She was found to have a bowel perforation and was taken to the OR.  She received a hemicolectomy with colostomy and mucous fistula.  She was able to successfully extubate and has been receiving tube feeds.   She is terribly deconditioned and frail.  Her prognosis is still very poor.  She has high ostomy out put at this point and very low albumin (1.1), potassium continues to be low despite aggressive repletion, but I am encouraged at her progress.     Length of Stay: 15  Current Medications: Scheduled Meds:  . chlorhexidine  15 mL Mouth Rinse BID  . ferrous sulfate  300 mg Per Tube BID WC  . free water  250 mL Per Tube QID  . heparin injection (subcutaneous)  5,000 Units Subcutaneous Q8H  . insulin aspart  0-15 Units Subcutaneous Q4H  . insulin glargine  5 Units Subcutaneous Daily  . mouth rinse  15 mL Mouth Rinse q12n4p  . metoprolol tartrate  12.5 mg Oral BID  . pantoprazole sodium  40 mg Per Tube Daily  . potassium chloride  40 mEq Per Tube TID    Continuous Infusions: .  sodium chloride 10 mL/hr at 08/25/17 1300  . feeding supplement (VITAL AF 1.2 CAL) 60 mL/hr at 08/25/17 0800    PRN Meds: acetaminophen (TYLENOL) oral liquid 160 mg/5 mL, albuterol, hydroxypropyl methylcellulose / hypromellose, midazolam, sodium chloride  Physical Exam       Elderly frail chronically ill appearing female, n/g in place, awake, alert, responsive. Resp no distress. Abdomen not examined LLE with blacken foot.  Vital Signs: BP 104/64   Pulse  92   Temp 98.1 F (36.7 C) (Oral)   Resp (!) 22   Ht 5' (1.524 m)   Wt 82.6 kg (182 lb 1.6 oz)   SpO2 97%   BMI 35.56 kg/m  SpO2: SpO2: 97 % O2 Device: O2 Device: Room Air O2 Flow Rate: O2 Flow Rate (L/min): 3 L/min  Intake/output summary:   Intake/Output Summary (Last 24 hours) at 08/25/2017 1418 Last data filed at 08/25/2017 1300 Gross per 24 hour  Intake 2312.03 ml  Output 1225 ml  Net 1087.03 ml   LBM: Last BM Date: 08/25/17 Baseline Weight: Weight: 86.2 kg (190 lb 0.6 oz) Most recent weight: Weight: 82.6 kg (182 lb 1.6 oz)       Palliative Assessment/Data: 20%    Flowsheet Rows     Most Recent Value  Intake Tab  Referral Department  Hospitalist  Unit at Time of Referral  ICU  Palliative Care Primary Diagnosis  Other (Comment) [Perforated viscus]  Date Notified  08/16/17  Palliative Care Type  Return patient Palliative Care  Reason for referral  Clarify Goals of Care  Date of Admission  08/24/2017  Date first seen by Palliative Care  08/17/17  # of days Palliative referral response time  1 Day(s)  # of days IP prior to Palliative referral  6  Clinical Assessment  Psychosocial & Spiritual Assessment  Palliative Care Outcomes      Patient Active Problem List   Diagnosis Date Noted  . Intra-abdominal free air of unknown etiology   . Severe protein-calorie malnutrition ([place])   . Atrial fibrillation ([place])   . Goals of care, counseling/discussion   . DNR (do not resuscitate)   . Acute respiratory  failure ([place])   . Acute respiratory failure with hypoxia ([place])   . S/P exploratory laparotomy   . Septic shock ([place])   . Perforation of colon s/p left colectomy/ostomy/mucus fistula 08/11/2017 07/31/2017  . Encounter for hospice care discussion   . Palliative care encounter   . Acute UTI   . AKI (acute kidney injury) ([place])   . Osteomyelitis of left foot ([place])   . UTI (urinary tract infection) 04/23/2017  . Osteomyelitis due to type 2 diabetes mellitus ([place]) 04/23/2017  . Uterine hypertrophy 06/11/2015  . Kidney lesion, native, bilateral 06/11/2015  . Protein-calorie malnutrition, severe ([place]) 09/22/2013  . Dementia 09/21/2013  . Constipation 09/21/2013  . Rectal bleeding 09/21/2013  . Fecal impaction ([place]) 09/21/2013  . BRBPR (bright red blood per rectum) 09/21/2013  . Altered mental status 05/17/2013  . Altered mental state 05/17/2013  . Delirium due to another medical condition 02/18/2013  . Acute blood loss anemia 03/03/2012  . Hx of right BKA ([place]) 03/03/2012  . Fall 03/03/2012  . Obesity (BMI 30-39.9) 03/03/2012  . Poor nutrition 03/03/2012  . Fracture of femur, distal, right, closed ([place]) 03/01/2012  . Lower urinary tract infectious disease 03/01/2012  . Insulin dependent diabetes mellitus ([place]) 03/01/2012  . Hypertension     Palliative Care Plan    Recommendations/Plan:  Will order repeat SLP eval as she seems to have the strength and desire to eat.  She may be stable enough to leave ICU.  Will defer to attending team.  Spoke with [name] on the phone.  Explained that I am hopeful for her to eventually return to SNF for long term care with Hospice support.  PMT meeting tentatively scheduled for 8/7 at 3:00 pm.  If she is able to be discharged before then we will  speak with the family earlier.  Goals of Care and Additional Recommendations:  Limitations on Scope of Treatment: DNR. No PEG. No invasive procedures.   Anticipate D/C to SNF for long term care with  Hospice at some point.  Code Status:  DNR  Prognosis:  Optimistically weeks to months if she continues to improve.  Discharge Planning:  [facility] for long term care with Hospice assuming family is agreeable.   Hospice could assist with wound care and symptom management at [facility].  Care plan was discussed with Carrolyn Leigh.  Thank you for allowing the Palliative Medicine Team to assist in the care of this patient.  Total time spent:  35 min.     Greater than 50%  of this time was spent counseling and coordinating care related to the above assessment and plan.  [name], PA-C Palliative Medicine  Please contact Palliative MedicineTeam phone at [phone] for questions and concerns between 7 am - 7 pm.   Please see AMION for individual provider pager numbers.

## 2017-08-25 NOTE — Progress Notes (Signed)
  Echocardiogram 2D Echocardiogram has been performed.  Darlina Sicilian M 08/25/2017, 11:15 AM

## 2017-08-25 NOTE — Consult Note (Signed)
[place] Nurse ostomy follow up Assessment completed [facility] 2M12.  No family present.  Patient's primary care RN, Shirlean Mylar assisting with plan of care. Stoma type/location: 1 5/8 inches, budded, pink, moist, producing large amounts of thin yellow effluent.  Shirlean Mylar states the patient's output is highest after her water flushes.  Today she has had to remove the ostomy appliance, change the abdominal dressing, and do a total linen change and cleaning of patient due to ostomy barrier not handling the output.  Shirlean Mylar had placed a small Eakin pouch and connected it to a bedside drainage bag.  This is working very well.    Plan is to continue the use of the small Eakin pouch to bedside drainage.  Four additional pouches have been requested and a measuring template was provided to assist with sizing the opening. Val Riles, RN, MSN, CWOCN, CNS-BC, pager [phone]

## 2017-08-25 NOTE — Progress Notes (Signed)
15 Days Post-Op   Subjective/Chief Complaint: No significant change    Objective: Vital signs in last 24 hours: Temp:  [97.3 F (36.3 C)-98.5 F (36.9 C)] 97.9 F (36.6 C) (07/31 0745) Pulse Rate:  [84-114] 93 (07/31 0500) Resp:  [18-32] 20 (07/31 0500) BP: (84-133)/(30-90) 128/54 (07/31 0500) SpO2:  [63 %-99 %] 98 % (07/31 0500) FiO2 (%):  [30 %] 30 % (07/30 2000) Weight:  [82.6 kg (182 lb 1.6 oz)] 82.6 kg (182 lb 1.6 oz) (07/31 0455) Last BM Date: 08/25/17  Intake/Output from previous day: 07/30 0701 - 07/31 0700 In: 2539.9 [I.V.:199.9; NG/GT:2290; IV Piggyback:50] Out: 2220 [Urine:710; Stool:1510] Intake/Output this shift: No intake/output data recorded.  Incision/Wound:ostomy functioning viable pouch leaking wound with stool in it   Lab Results:  Recent Labs    08/24/17 0408 08/25/17 0503  WBC 8.1 7.5  HGB 8.6* 7.6*  HCT 28.0* 24.9*  PLT 281 313   BMET Recent Labs    08/24/17 1643 08/25/17 0503  NA 151* 150*  K 3.2* 2.9*  CL 125* 126*  CO2 17* 18*  GLUCOSE 284* 216*  BUN 42* 40*  CREATININE 1.52* 1.49*  CALCIUM 7.7* 7.7*   PT/INR No results for input(s): LABPROT, INR in the last 72 hours. ABG No results for input(s): PHART, HCO3 in the last 72 hours.  Invalid input(s): PCO2, PO2  Studies/Results: Dg Chest Port 1 View  Result Date: 08/23/2017 CLINICAL DATA:  Nasogastric tube, postop EXAM: PORTABLE CHEST 1 VIEW COMPARISON:  Portable exam 1952 hours compared to 08/16/2017 FINDINGS: Nasogastric tube extends into stomach, with proximal side-port at approximately the gastroesophageal junction region. RIGHT jugular central venous catheter tip projects over RIGHT atrium unchanged, though patient is mildly kyphotic in positioning. Upper normal size of cardiac silhouette. Atherosclerotic calcification aorta. Slight pulmonary vascular congestion. Bibasilar atelectasis. Upper lungs clear. No pleural effusion or pneumothorax. IMPRESSION: Tip of nasogastric tube  projects over stomach. Bibasilar atelectasis. Electronically Signed   By: Lavonia Dana M.D.   On: 08/23/2017 20:07    Anti-infectives: Anti-infectives (From admission, onward)   Start     Dose/Rate Route Frequency Ordered Stop   08/18/17 1200  anidulafungin (ERAXIS) 100 mg in sodium chloride 0.9 % 100 mL IVPB     100 mg 78 mL/hr over 100 Minutes Intravenous Every 24 hours 08/18/17 0907 08/20/17 1432   08/18/17 1000  piperacillin-tazobactam (ZOSYN) IVPB 3.375 g     3.375 g 12.5 mL/hr over 240 Minutes Intravenous Every 8 hours 08/18/17 0907 08/20/17 2250   08/13/17 1100  anidulafungin (ERAXIS) 100 mg in sodium chloride 0.9 % 100 mL IVPB  Status:  Discontinued     100 mg 78 mL/hr over 100 Minutes Intravenous Every 24 hours 08/12/17 1048 08/17/17 1308   08/12/17 1100  anidulafungin (ERAXIS) 200 mg in sodium chloride 0.9 % 200 mL IVPB     200 mg 78 mL/hr over 200 Minutes Intravenous  Once 08/12/17 1048 08/12/17 1648   08/11/17 0600  piperacillin-tazobactam (ZOSYN) IVPB 3.375 g     3.375 g 12.5 mL/hr over 240 Minutes Intravenous Every 8 hours 08/11/17 0025 08/17/17 2359   08/11/17 0115  vancomycin (VANCOCIN) IVPB 750 mg/150 ml premix     750 mg 150 mL/hr over 60 Minutes Intravenous Every 24 hours 08/11/17 0110 08/17/17 0211   08/11/17 0045  piperacillin-tazobactam (ZOSYN) IVPB 3.375 g     3.375 g 100 mL/hr over 30 Minutes Intravenous  Once 08/11/17 0033 08/11/17 0206   08/25/2017 2000  piperacillin-tazobactam (  ZOSYN) IVPB 3.375 g  Status:  Discontinued     3.375 g 100 mL/hr over 30 Minutes Intravenous  Once 08/13/2017 1952 08/11/17 0025      Assessment/Plan: s/p Procedure(s): EXPLORATORY LAPAROTOMY FOR FREE AIR, LEFT EXTENDED HEMICOLECTOMY, COLOSTOMY AND MUCUS FISTULA (N/A)  Perforated proximal descending colon Extended left hemicolectomy with distal transverse colostomy and mucous fistula creation, 08/11/17,Dr Kae Heller. POD#13, -TFs atgoal rate and tolerating well. -BID midline dressing  changesand prn for saturation from serous drainage -WOC RN for ostomy care -cont zosyn and vanc, Eraxis added 7/18- stopped 7/23 -WBCnormal   Anemia - 7.6 transfuse   Sepsis -secondary to #1, patientoff pressors -abx therapy- discontinued 7/26  Thrombocytopenia -stable  VDRF - extubated 6:30 PM 7/25 - one way extubation per family, stable  DM- SSI for now  HTN- resolved, BB on hold  AKI on CKD stage III -Creatinine1.56  Hypokalemia - K 2.9 replace  this AM   Severe malnutrition - tube feeds/Speech evaluation for swallowing  ABL anemia - H/H 8.6/28.0  Paroxysmal A.Fib - rate controlled  Hx of R BKA secondary to peripheral vascular disease - L toes dark, good doppler pulse, continue current care  Acute metabolic encephalopathy - multifactoral, followed simple commands this AM  Hypernatremia - sodium 151, serum osmolality 327; free water increased, continue to monitor  FEN -NPO, IVFs, TFs at60cc/hr VTE - SCDs - Heparin held for low platelets ID -Zosyn 7/16-->Vanc 7/16-->Eraxis 7/18 -->all abx stopped 08/17/17.(Calcitonin 32) Zosyn/Eraxis restarted 7/24>> 7/26      LOS: 15 days    Marcello Moores A Remy Voiles 08/25/2017

## 2017-08-26 ENCOUNTER — Inpatient Hospital Stay (HOSPITAL_COMMUNITY): Payer: Medicare Other

## 2017-08-26 DIAGNOSIS — I481 Persistent atrial fibrillation: Secondary | ICD-10-CM

## 2017-08-26 LAB — CBC
HCT: 29.7 % — ABNORMAL LOW (ref 36.0–46.0)
Hemoglobin: 9.3 g/dL — ABNORMAL LOW (ref 12.0–15.0)
MCH: 27.4 pg (ref 26.0–34.0)
MCHC: 31.3 g/dL (ref 30.0–36.0)
MCV: 87.6 fL (ref 78.0–100.0)
PLATELETS: 336 10*3/uL (ref 150–400)
RBC: 3.39 MIL/uL — AB (ref 3.87–5.11)
RDW: 20 % — ABNORMAL HIGH (ref 11.5–15.5)
WBC: 9 10*3/uL (ref 4.0–10.5)

## 2017-08-26 LAB — COMPREHENSIVE METABOLIC PANEL
ALT: 15 U/L (ref 0–44)
ANION GAP: 7 (ref 5–15)
AST: 19 U/L (ref 15–41)
Albumin: 1.3 g/dL — ABNORMAL LOW (ref 3.5–5.0)
Alkaline Phosphatase: 99 U/L (ref 38–126)
BUN: 37 mg/dL — ABNORMAL HIGH (ref 8–23)
CHLORIDE: 130 mmol/L — AB (ref 98–111)
CO2: 15 mmol/L — ABNORMAL LOW (ref 22–32)
CREATININE: 1.41 mg/dL — AB (ref 0.44–1.00)
Calcium: 7.9 mg/dL — ABNORMAL LOW (ref 8.9–10.3)
GFR calc Af Amer: 40 mL/min — ABNORMAL LOW (ref >60)
GFR, EST NON AFRICAN AMERICAN: 35 mL/min — AB (ref >60)
Glucose, Bld: 233 mg/dL — ABNORMAL HIGH (ref 70–99)
POTASSIUM: 3.9 mmol/L (ref 3.5–5.1)
Sodium: 152 mmol/L — ABNORMAL HIGH (ref 135–145)
Total Bilirubin: 0.8 mg/dL (ref 0.3–1.2)
Total Protein: 4.7 g/dL — ABNORMAL LOW (ref 6.5–8.1)

## 2017-08-26 LAB — TYPE AND SCREEN
ABO/RH(D): B NEG
ANTIBODY SCREEN: NEGATIVE
UNIT DIVISION: 0

## 2017-08-26 LAB — BPAM RBC
Blood Product Expiration Date: 201908062359
ISSUE DATE / TIME: 201907310902
Unit Type and Rh: 1700

## 2017-08-26 LAB — GLUCOSE, CAPILLARY
GLUCOSE-CAPILLARY: 168 mg/dL — AB (ref 70–99)
GLUCOSE-CAPILLARY: 193 mg/dL — AB (ref 70–99)
Glucose-Capillary: 204 mg/dL — ABNORMAL HIGH (ref 70–99)
Glucose-Capillary: 236 mg/dL — ABNORMAL HIGH (ref 70–99)
Glucose-Capillary: 202 mg/dL — ABNORMAL HIGH (ref 70–99)

## 2017-08-26 MED ORDER — SODIUM CHLORIDE 0.9% FLUSH
10.0000 mL | INTRAVENOUS | Status: DC | PRN
  Administered 2017-08-27 – 2017-09-01 (×5): 10 mL
  Filled 2017-08-26 (×5): qty 40

## 2017-08-26 MED ORDER — DILTIAZEM HCL 60 MG PO TABS
30.0000 mg | ORAL_TABLET | Freq: Two times a day (BID) | ORAL | Status: DC
  Administered 2017-08-26 – 2017-08-27 (×2): 30 mg via ORAL
  Filled 2017-08-26 (×2): qty 1

## 2017-08-26 NOTE — Progress Notes (Addendum)
Modified Barium Swallow Progress Note  Patient Details  Name: Judith [name] MRN: [id number] Date of Birth: May 02, 1940  Today's Date: 08/26/2017  Modified Barium Swallow completed.  Full report located under Chart Review in the Imaging Section.  Brief recommendations include the following:  Clinical Impression  Difficult study, pt lethargic but opened eyes and responded to PO presentation. Initially pt became drowsy while orally holding PO, requiring deep suction without gag. After this however, pt woke up enough to more actively orally manipulate a bite of puree. Oral phase still prolonged with extended lingual pumping and inefficient oral transit followed by delayed, weak ineffective swallow response with sever vallecular residue (mild residue below CP noted). Pharyngeal mobility certainly impacted by large bore NG, however pt too lethargic to warrant removal today as she will need ongoing alternate method of nutrition over the next 24 hours. Suggested removal of NG tomorrow with replacement of a Cortrak which would allow greater progress with diagnostic PO trials. WIll follow for readiness for PO vs further objective testing.    Swallow Evaluation Recommendations       SLP Diet Recommendations: NPO;Alternative means - temporary       Medication Administration: Via alternative means                        Mikalyn Hermida, Katherene Ponto 08/26/2017,10:41 AM

## 2017-08-26 NOTE — Progress Notes (Signed)
Ref: System, Pcp Not In   Subjective:  Awakens on agitation. VS stable.   Objective:  Vital Signs in the last 24 hours: Temp:  [97.9 F (36.6 C)-98.8 F (37.1 C)] 98.5 F (36.9 C) (08/01 1830) Pulse Rate:  [93-100] 98 (08/01 1830) Cardiac Rhythm: Sinus tachycardia (08/01 0700) Resp:  [18-26] 22 (08/01 1830) BP: (100-130)/(37-63) 119/59 (08/01 1830) SpO2:  [96 %-100 %] 100 % (08/01 1830) Weight:  [85 kg (187 lb 6.3 oz)] 85 kg (187 lb 6.3 oz) (08/01 0500)  Physical Exam: BP Readings from Last 1 Encounters:  08/26/17 (!) 119/59     Wt Readings from Last 1 Encounters:  08/26/17 85 kg (187 lb 6.3 oz)    Weight change: 2.4 kg (5 lb 4.7 oz) Body mass index is 36.6 kg/m. HEENT: [place]/AT, Eyes-Brown, Conjunctiva-Pale pink, Sclera-Non-icteric Neck: No JVD, No bruit, Trachea midline. Lungs:  Clear, Bilateral. Cardiac:  Regular rhythm, normal S1 and S2, no S3. II/VI systolic murmur. Abdomen:  Soft, non-tender. BS present. Extremities:  1 + edema present. No cyanosis. No clubbing. Right BKA. CNS: AxOx1, Cranial nerves grossly intact, moves all 4 extremities.  Skin: Warm and dry.   Intake/Output from previous day: 07/31 0701 - 08/01 0700 In: 2831.1 [P.O.:60; I.V.:296.6; Blood:362.5; NG/GT:2112] Out: 3700 [Urine:1700; Stool:2000]    Lab Results: BMET    Component Value Date/Time   NA 152 (H) 08/26/2017 0441   NA 150 (H) 08/25/2017 0503   NA 151 (H) 08/24/2017 1643   K 3.9 08/26/2017 0441   K 2.9 (L) 08/25/2017 0503   K 3.2 (L) 08/24/2017 1643   CL 130 (H) 08/26/2017 0441   CL 126 (H) 08/25/2017 0503   CL 125 (H) 08/24/2017 1643   CO2 15 (L) 08/26/2017 0441   CO2 18 (L) 08/25/2017 0503   CO2 17 (L) 08/24/2017 1643   GLUCOSE 233 (H) 08/26/2017 0441   GLUCOSE 216 (H) 08/25/2017 0503   GLUCOSE 284 (H) 08/24/2017 1643   BUN 37 (H) 08/26/2017 0441   BUN 40 (H) 08/25/2017 0503   BUN 42 (H) 08/24/2017 1643   CREATININE 1.41 (H) 08/26/2017 0441   CREATININE 1.49 (H) 08/25/2017  0503   CREATININE 1.52 (H) 08/24/2017 1643   CALCIUM 7.9 (L) 08/26/2017 0441   CALCIUM 7.7 (L) 08/25/2017 0503   CALCIUM 7.7 (L) 08/24/2017 1643   GFRNONAA 35 (L) 08/26/2017 0441   GFRNONAA 32 (L) 08/25/2017 0503   GFRNONAA 32 (L) 08/24/2017 1643   GFRAA 40 (L) 08/26/2017 0441   GFRAA 38 (L) 08/25/2017 0503   GFRAA 37 (L) 08/24/2017 1643   CBC    Component Value Date/Time   WBC 9.0 08/26/2017 0441   RBC 3.39 (L) 08/26/2017 0441   HGB 9.3 (L) 08/26/2017 0441   HCT 29.7 (L) 08/26/2017 0441   HCT 26.7 (L) 08/24/2017 0400   PLT 336 08/26/2017 0441   MCV 87.6 08/26/2017 0441   MCH 27.4 08/26/2017 0441   MCHC 31.3 08/26/2017 0441   RDW 20.0 (H) 08/26/2017 0441   LYMPHSABS 2.4 08/07/2017 1946   MONOABS 0.1 08/08/2017 1946   EOSABS 0.0 08/15/2017 1946   BASOSABS 0.1 08/15/2017 1946   HEPATIC Function Panel Recent Labs    08/24/17 0408 08/25/17 0503 08/26/17 0441  PROT 4.7* 4.3* 4.7*   HEMOGLOBIN A1C No components found for: HGA1C,  MPG CARDIAC ENZYMES Lab Results  Component Value Date   CKTOTAL 93 04/28/2014   CKMB 1.6 11/27/2010   BNP No results for input(s): PROBNP in  the last 8760 hours. TSH Recent Labs    04/23/17 2215  TSH 0.543   CHOLESTEROL No results for input(s): CHOL in the last 8760 hours.  Scheduled Meds: . chlorhexidine  15 mL Mouth Rinse BID  . diltiazem  30 mg Oral Q12H  . ferrous sulfate  300 mg Per Tube BID WC  . free water  250 mL Per Tube QID  . heparin injection (subcutaneous)  5,000 Units Subcutaneous Q8H  . insulin aspart  0-15 Units Subcutaneous Q4H  . insulin glargine  5 Units Subcutaneous Daily  . mouth rinse  15 mL Mouth Rinse q12n4p  . metoprolol tartrate  12.5 mg Oral BID  . pantoprazole sodium  40 mg Per Tube Daily   Continuous Infusions: . sodium chloride 10 mL/hr at 08/25/17 2100  . feeding supplement (VITAL AF 1.2 CAL) 60 mL/hr at 08/25/17 1800   PRN Meds:.acetaminophen (TYLENOL) oral liquid 160 mg/5 mL, albuterol,  hydroxypropyl methylcellulose / hypromellose, midazolam, sodium chloride, sodium chloride flush  Assessment/Plan: Paroxysmal atrial fibrillation S/P large bowel perforation and surgery Anemia of blood and chronic disease Right BKA Acute kidney injury Altered mental status Severe hypoalbuminemia  Add small dose of diltiazem to small dose of B-blocker for rate control.   LOS: 16 days    [name]  MD  08/26/2017, 7:28 PM

## 2017-08-26 NOTE — Care Management Important Message (Signed)
Important Message  Patient Details  Name: Judith [name] MRN: [id number] Date of Birth: 23-Nov-1940   Medicare Important Message Given:  Yes    Orbie Pyo 08/26/2017, 1:47 PM

## 2017-08-26 NOTE — Progress Notes (Signed)
[facility]                                                                              Patient Demographics  Judith [name], is a 77 y.o. female, DOB - [birth year], AOZ:[id number]  Admit date - 08/21/2017   Admitting Physician Md Edison Pace, MD  Outpatient Primary MD for the patient is System, Pcp Not In  Outpatient specialists:   LOS - 16  days   Medical records reviewed and are as summarized below:    Chief Complaint  Patient presents with  . Altered Mental Status       Brief summary   Judith [name] a 77 y.o.female with past medical history significant for hypertension, paroxysmal atrial fibrillation, left foot osteomyelitis, right below the knee amputation, who presented to [facility] ED on 08/22/2017 due to altered mental status. She was found to be in septic shock from perforated proximal descending colon and pneumoperitoneum. She underwent left hemicolectomy with mobilization of the splenic flexure, end colostomy and mucous fistula by surgery. Post op she required continual intubation and vasopressor use and was on PCCM service. Currently she is extubated and off pressors and medical service consulted for management of medical issues.  Hospital course complicated by persistent encephalopathy, severe protein calorie malnutrition, dysphagia and severe physical debility.  Assessment & Plan    Principal Problem:   Perforation of colon s/p left colectomy/ostomy/mucus fistula 08/11/2017 -Status post exploratory laparotomy for free air, left extended hemicolectomy, colostomy and mucous fistula, Dr. Kae Heller -Postop day #15, management per surgery -Patient has completed a course of IV vancomycin and Zosyn, currently wound care RN following for ostomy care -On tube feeds, at goal  Active problems Acute metabolic encephalopathy -Multifactorial secondary to #1, acute illness, probable underlying dementia, severe malnutrition -Somnolent, difficult to arouse, unclear  baseline -Avoid sedating meds, better sleep hygiene, early ambulation  Acute blood loss anemia/iron deficiency -Likely due to #1, H&H currently stable 9.3, received 2 units packed RBCs on 7/31   Acute respiratory failure with hypoxia -Extubated on 7/25, currently O2 sats 100% on room air -Per goals of care DNR  Hypokalemia, acute kidney injury likely due to GI losses on CKD stage III -Creatinine function improving, potassium stable  Hypernatremia -Follow I's and O's closely, 10.2 L positive -Continue free water, follow closely  Paroxysmal A. Fib -Not on any rate control agents -2D echo showed EF of 55 to 60% with grade 1 diastolic dysfunction  Moderate to severe protein calorie malnutrition, generalized debility -Palliative goals of care done on 7/31, DNR -Skilled nursing facility for long-term care with hospice recommended  Severe PVD status post Rt BKA -PT evaluation when able to  Code Status: DNR DVT Prophylaxis: Heparin subcu Family Communication: No family member at the bedside   Disposition Plan: Per primary service, CCS Time Spent in minutes   35 minutes  Procedures:  Exploratory laparotomy, hemicolectomy, colostomy  Consultants:   General surgery Palliative care  Antimicrobials:      Medications  Scheduled Meds: . chlorhexidine  15 mL Mouth Rinse BID  . ferrous sulfate  300 mg Per Tube BID WC  . free water  250 mL Per Tube QID  . heparin injection (subcutaneous)  5,000 Units Subcutaneous Q8H  . insulin aspart  0-15 Units Subcutaneous Q4H  . insulin glargine  5 Units Subcutaneous Daily  . mouth rinse  15 mL Mouth Rinse q12n4p  . metoprolol tartrate  12.5 mg Oral BID  . pantoprazole sodium  40 mg Per Tube Daily   Continuous Infusions: . sodium chloride 10 mL/hr at 08/25/17 2100  . feeding supplement (VITAL AF 1.2 CAL) 60 mL/hr at 08/25/17 1800   PRN Meds:.acetaminophen (TYLENOL) oral liquid 160 mg/5 mL, albuterol, hydroxypropyl methylcellulose  / hypromellose, midazolam, sodium chloride, sodium chloride flush   Antibiotics   Anti-infectives (From admission, onward)   Start     Dose/Rate Route Frequency Ordered Stop   08/18/17 1200  anidulafungin (ERAXIS) 100 mg in sodium chloride 0.9 % 100 mL IVPB     100 mg 78 mL/hr over 100 Minutes Intravenous Every 24 hours 08/18/17 0907 08/20/17 1432   08/18/17 1000  piperacillin-tazobactam (ZOSYN) IVPB 3.375 g     3.375 g 12.5 mL/hr over 240 Minutes Intravenous Every 8 hours 08/18/17 0907 08/20/17 2250   08/13/17 1100  anidulafungin (ERAXIS) 100 mg in sodium chloride 0.9 % 100 mL IVPB  Status:  Discontinued     100 mg 78 mL/hr over 100 Minutes Intravenous Every 24 hours 08/12/17 1048 08/17/17 1308   08/12/17 1100  anidulafungin (ERAXIS) 200 mg in sodium chloride 0.9 % 200 mL IVPB     200 mg 78 mL/hr over 200 Minutes Intravenous  Once 08/12/17 1048 08/12/17 1648   08/11/17 0600  piperacillin-tazobactam (ZOSYN) IVPB 3.375 g     3.375 g 12.5 mL/hr over 240 Minutes Intravenous Every 8 hours 08/11/17 0025 08/17/17 2359   08/11/17 0115  vancomycin (VANCOCIN) IVPB 750 mg/150 ml premix     750 mg 150 mL/hr over 60 Minutes Intravenous Every 24 hours 08/11/17 0110 08/17/17 0211   08/11/17 0045  piperacillin-tazobactam (ZOSYN) IVPB 3.375 g     3.375 g 100 mL/hr over 30 Minutes Intravenous  Once 08/11/17 0033 08/11/17 0206   07/26/2017 2000  piperacillin-tazobactam (ZOSYN) IVPB 3.375 g  Status:  Discontinued     3.375 g 100 mL/hr over 30 Minutes Intravenous  Once 07/30/2017 1952 08/11/17 0025        Subjective:   Judith [name] was seen and examined today.  Very somnolent, responsive to pain stimulus.  Unable to obtain review of system from the patient  Objective:   Vitals:   08/25/17 2100 08/25/17 2146 08/26/17 0500 08/26/17 0937  BP: (!) 104/37 (!) 100/57  130/63  Pulse: 93 93  100  Resp: 18 18  (!) 26  Temp:  98.4 F (36.9 C)  98.8 F (37.1 C)  TempSrc:  Oral  Oral  SpO2: 96% 100%   100%  Weight:   85 kg (187 lb 6.3 oz)   Height:        Intake/Output Summary (Last 24 hours) at 08/26/2017 1212 Last data filed at 08/26/2017 0543 Gross per 24 hour  Intake 2098.91 ml  Output 3400 ml  Net -1301.09 ml     Wt Readings from Last 3 Encounters:  08/26/17 85 kg (187 lb 6.3 oz)  04/26/17 78.6 kg (173 lb 4.5 oz)  03/18/17 75.3 kg (166 lb)     Exam  General: Somnolent, NGT  Eyes:  HEENT:    Cardiovascular: S1 S2 auscultated, Regular rate and rhythm.  Respiratory: Clear to auscultation bilaterally, no wheezing, rales  or rhonchi  Gastrointestinal: Soft, midline incision with clean dressing, stool in the pouch  Ext: no pedal edema bilaterally  Neuro: unable to assess  Musculoskeletal: No digital cyanosis, clubbing  Skin: No rashes  Psych: somnolent   Data Reviewed:  I have personally reviewed following labs and imaging studies  Micro Results No results found for this or any previous visit (from the past 240 hour(s)).  Radiology Reports Ct Abdomen Pelvis Wo Contrast  Result Date: 08/14/2017 CLINICAL DATA:  Abdominal distension.  Follow-up pneumoperitoneum. EXAM: CT ABDOMEN AND PELVIS WITHOUT CONTRAST TECHNIQUE: Multidetector CT imaging of the abdomen and pelvis was performed following the standard protocol without IV contrast. COMPARISON:  Abdominal radiograph August 10, 2016 and CT abdomen and pelvis August 01, 2015. FINDINGS: LOWER CHEST: Bilateral lower lobe consolidation. Heart size is normal. Moderate coronary artery calcifications. No pericardial effusion. HEPATOBILIARY: Normal. PANCREAS: Normal. SPLEEN: Normal. ADRENALS/URINARY TRACT: Kidneys are orthotopic, demonstrating normal size and morphology. No nephrolithiasis, hydronephrosis; limited assessment for renal masses by nonenhanced CT. 2.5 cm dense (70 Hounsfield units) RIGHT lower pole proteinaceous/hemorrhagic cyst, no routine indicated follow-up by consensus. 9 mm dense RIGHT interpolar cyst. Isodense  probable mass lower pole LEFT kidney. All renal masses appear relatively stable from prior contrast enhanced CT. The unopacified ureters are normal in course and caliber. Urinary bladder is partially distended and unremarkable. Normal adrenal glands. STOMACH/BOWEL: Moderate hiatal hernia. Large amount of stool in sigmoid colon through the level of the anus, rectum distended to 9.1 cm. At proximal sigmoid colon is a linear intramural lucency concerning for perforation (coronal image 32), associated circumferential focal mural thickening. Adjacent feculent appearing material in adjacent peritoneum. Moderate colonic diverticulosis. Debris distended stomach. Normal appendix. VASCULAR/LYMPHATIC: Aortoiliac vessels are normal in course and caliber. Moderate to severe calcific atherosclerosis. No lymphadenopathy by CT size criteria. REPRODUCTIVE: Normal. OTHER: Small volume ascites LEFT upper quadrant. Large volume pneumoperitoneum. MUSCULOSKELETAL: Non-acute. IMPRESSION: 1. Large volume pneumoperitoneum. Probable sigmoid bowel perforation with adjacent probable stool in peritoneal cavity. Ulcerated tumor not excluded. 2. Large volume rectosigmoid stool, rectum distended to 9.1 cm. 3. Bilateral lower lobe consolidation concerning for pneumonia. 4. Critical Value/emergent results were called by telephone at the time of interpretation on 08/12/2017 at 8:50 pm to Dr. Dorie Rank , who verbally acknowledged these results. Electronically Signed   By: Elon Alas M.D.   On: 08/15/2017 20:54   Ct Head Wo Contrast  Result Date: 07/29/2017 CLINICAL DATA:  Altered mental status from baseline. EXAM: CT HEAD WITHOUT CONTRAST TECHNIQUE: Contiguous axial images were obtained from the base of the skull through the vertex without intravenous contrast. COMPARISON:  04/23/2017 FINDINGS: Brain: Subtle hypodensity involving the left pons and midbrain may represent nonhemorrhagic infarcts, apparently new since prior comparison on  04/23/2017. Atrophy with chronic stable small vessel ischemic disease of periventricular white matter. No acute intracranial hemorrhage, midline shift or edema. No large vascular territory infarct. No intra-axial mass nor extra-axial fluid collections. Midline fourth ventricle and basal cisterns without effacement. Vascular: Hyperdense vessel sign. Skull: No acute skull fracture Sinuses/Orbits: Bilateral lens replacements. Clear paranasal sinuses. Other: Clear mastoids IMPRESSION: 1. Chronic appearing small vessel ischemic disease of periventricular white matter with atrophy. 2. Age indeterminate but possibly new since prior exam are subtle hypodensities in the left pons and midbrain suspicious for small nonhemorrhagic infarcts. Electronically Signed   By: Ashley Royalty M.D.   On: 07/31/2017 20:45   Dg Chest Port 1 View  Result Date: 08/23/2017 CLINICAL DATA:  Nasogastric tube, postop EXAM:  PORTABLE CHEST 1 VIEW COMPARISON:  Portable exam 1952 hours compared to 08/16/2017 FINDINGS: Nasogastric tube extends into stomach, with proximal side-port at approximately the gastroesophageal junction region. RIGHT jugular central venous catheter tip projects over RIGHT atrium unchanged, though patient is mildly kyphotic in positioning. Upper normal size of cardiac silhouette. Atherosclerotic calcification aorta. Slight pulmonary vascular congestion. Bibasilar atelectasis. Upper lungs clear. No pleural effusion or pneumothorax. IMPRESSION: Tip of nasogastric tube projects over stomach. Bibasilar atelectasis. Electronically Signed   By: Lavonia Dana M.D.   On: 08/23/2017 20:07   Dg Chest Port 1 View  Result Date: 08/16/2017 CLINICAL DATA:  Acute respiratory failure EXAM: PORTABLE CHEST 1 VIEW COMPARISON:  08/15/2017 FINDINGS: Endotracheal tube, nasogastric catheter and right jugular central line are again identified and stable in appearance. Lungs are well aerated bilaterally. Some improved aeration in the left base is  noted when compare with the previous day. No new focal infiltrate or bony abnormality is noted. IMPRESSION: Improving aeration in the left base. Electronically Signed   By: Inez Catalina M.D.   On: 08/16/2017 06:58   Dg Chest Port 1 View  Result Date: 08/15/2017 CLINICAL DATA:  Acute respiratory failure with hypoxia EXAM: PORTABLE CHEST 1 VIEW COMPARISON:  08/14/2017 FINDINGS: Endotracheal tube 3 cm from carina. NG tube in stomach. Low lung volumes. There is LEFT lower lobe effusion with opacity representing atelectasis or infiltrate. Mild increase in the LEFT upper lobe density. Upper lungs are clear. No pneumothorax IMPRESSION: 1. Stable support apparatus. 2. Increased atelectasis versus infiltrate in the LEFT lower lobe with small effusion. Electronically Signed   By: Suzy Bouchard M.D.   On: 08/15/2017 10:28   Dg Chest Port 1 View  Result Date: 08/14/2017 CLINICAL DATA:  Acute respiratory failure with hypoxia. EXAM: PORTABLE CHEST 1 VIEW COMPARISON:  08/13/2017 FINDINGS: Endotracheal tube has tip 3.3 cm above the carina. Nasogastric tube has side-port over the stomach in the left upper quadrant as tip is not visualized. Right IJ central venous catheter is unchanged with tip just above the cavoatrial junction. Lungs are adequately inflated with subtle left retrocardiac opacification likely atelectasis. Lungs are otherwise clear. Mild stable cardiomegaly. Remainder of the exam is unchanged. IMPRESSION: Mild left retrocardiac opacification likely atelectasis unchanged. Mild stable cardiomegaly. Tubes and lines as described. Electronically Signed   By: Marin Olp M.D.   On: 08/14/2017 09:08   Dg Chest Port 1 View  Result Date: 08/13/2017 CLINICAL DATA:  Acute respiratory failure. EXAM: PORTABLE CHEST 1 VIEW COMPARISON:  08/12/2017. FINDINGS: Endotracheal tube tip noted 3 cm above the carina. NG tube tip noted below the left hemidiaphragm. Right IJ line tip noted over the right atrium. Heart size  normal. Low lung volumes mild bibasilar atelectasis/infiltrates. Small right pleural effusion cannot be excluded. No left pleural effusion noted on today's exam. No pneumothorax. IMPRESSION: 1. Endotracheal tube tip noted 3 cm above the carina. NG tube and right IJ line stable position. 2. Low lung volumes. Bibasilar atelectasis/infiltrates again noted. Small right pleural effusion cannot be excluded. No left pleural effusion noted on today's exam. No pneumothorax. Electronically Signed   By: Marcello Moores  Register   On: 08/13/2017 10:28   Dg Chest Port 1 View  Result Date: 08/12/2017 CLINICAL DATA:  Acute respiratory failure EXAM: PORTABLE CHEST 1 VIEW COMPARISON:  08/11/2017 at 1:35 a.m. FINDINGS: Endotracheal tube tip has been retracted and is just above the level of the clavicular heads. Right internal jugular vein approach central venous catheter tip is in the  low right atrium. Small left pleural effusion with associated atelectasis. IMPRESSION: 1. Endotracheal tube tip just above the level of the clavicular heads. 2. Low right atrium positioning of central venous catheter. Correlate with EKG. 3. Small left pleural effusion and associated atelectasis. Electronically Signed   By: Ulyses Jarred M.D.   On: 08/12/2017 13:51   Dg Chest Port 1 View  Result Date: 08/11/2017 CLINICAL DATA:  77 y/o F; post intubation and central line placement. EXAM: PORTABLE CHEST 1 VIEW COMPARISON:  08/13/2017 chest radiograph FINDINGS: Stable cardiomegaly given projection and technique. No appreciable pneumoperitoneum. Stable bibasilar opacities and small effusions. Endotracheal tube tip projects 3.7 cm above the carina. Right central venous catheter tip projects over right cavoatrial junction. Enteric tube tip extends below field of view into the abdomen. Bones are unremarkable. IMPRESSION: 1. Stable bibasilar atelectasis and small effusions. 2. Endotracheal tube tip 3.7 cm above the carina. 3. Enteric tube tip extends below field  of view into abdomen. 4. Right central venous catheter tip projects over cavoatrial junction. Electronically Signed   By: Kristine Garbe M.D.   On: 08/11/2017 02:05   Dg Chest Port 1 View  Result Date: 08/21/2017 CLINICAL DATA:  Worsening altered mental status. History of dementia. EXAM: PORTABLE CHEST 1 VIEW COMPARISON:  Chest radiograph April 23, 2017 FINDINGS: Air underneath bilateral hemidiaphragms. Bibasilar atelectasis. Stable cardiomegaly. Calcified aortic arch. No pneumothorax. Osteopenia. IMPRESSION: 1. Pneumoperitoneum. 2. Stable cardiomegaly.  Bibasilar atelectasis/scarring. 3.  Aortic Atherosclerosis (ICD10-I70.0). 4. Critical Value/emergent results were called by telephone at the time of interpretation on 08/09/2017 at 7:23 pm to Dr. Dorie Rank , who verbally acknowledged these results. Electronically Signed   By: Elon Alas M.D.   On: 08/04/2017 19:24   Dg Swallowing Func-speech Pathology  Result Date: 08/26/2017 Objective Swallowing Evaluation: Type of Study: MBS-Modified Barium Swallow Study  Patient Details Name: Judith [name] MRN: [id number] Date of Birth: 05/16/1940 Today's Date: 08/26/2017 Time: SLP Start Time (ACUTE ONLY): 1015 -SLP Stop Time (ACUTE ONLY): 1030 SLP Time Calculation (min) (ACUTE ONLY): 15 min Past Medical History: Past Medical History: Diagnosis Date . Anxiety  . Arthritis   "left leg" (05/17/2013) . Asthma  . Charcot's joint of foot due to diabetes ([place])  . Closed right ankle fracture  . Colitis 09/06/02 . Diabetes mellitus without complication ([place])  . Fracture of femur, distal, right, closed ([place]) 03/01/2012 . GERD (gastroesophageal reflux disease)  . JEHUDJSH(702.6)   "probably weekly" (05/17/2013) . Hemorrhoid  . High cholesterol  . History of stomach ulcers  . Hyperlipidemia  . Hypertension  . Insulin dependent diabetes mellitus ([place]) 03/01/2012 . Obesity (BMI 30-39.9) 03/03/2012 . Perforated viscus 08/19/2017 . Pneumonia   "once" (05/17/2013) . Proctocolitis 09/04/02  Past Surgical History: Past Surgical History: Procedure Laterality Date . ABDOMINAL HYSTERECTOMY   . BELOW KNEE LEG AMPUTATION Right 02/2008 . BREAST LUMPECTOMY Bilateral   "not cancer" . CATARACT EXTRACTION W/ INTRAOCULAR LENS  IMPLANT, BILATERAL Bilateral  . LAPAROTOMY N/A 08/07/2017  Procedure: EXPLORATORY LAPAROTOMY FOR FREE AIR, LEFT EXTENDED HEMICOLECTOMY, COLOSTOMY AND MUCUS FISTULA;  Surgeon: Clovis Riley, MD;  Location: [facility];  Service: General;  Laterality: N/A; . ORIF FEMUR FRACTURE  03/01/2012  Procedure: OPEN REDUCTION INTERNAL FIXATION (ORIF) DISTAL FEMUR FRACTURE;  Surgeon: Rozanna Box, MD;  Location: [facility];  Service: Orthopedics;  Laterality: Right;  ORIF right femur HPI: 77 y.o. female  with past medical history of IDDM, charcot's joint, dementia, anxiety, proctocolitis, PUD, right BKA, asthma, and arthritis who  was admitted on 07/28/2017 with septic shock.  She was found to have a bowel perforation and was taken to the OR.  She received a hemicolectomy with colostomy and mucous fistula. Intubated 08/11/17 with one-way extubation on 08/19/17. Per palliative, prognosis <2 weeks in setting of hypotensive with Afib, severely deconditioned, malnourished, demented, with a large abdominal wound. No tracheostomy, no PEG.  Subjective: weakly states name Assessment / Plan / Recommendation CHL IP CLINICAL IMPRESSIONS 08/26/2017 Clinical Impression Difficult study, pt lethargic but opened eyes and responded to PO presentation. Initially pt became drowsy while orally holding PO, requiring deep suction without gag. After this however, pt woke up enough to more actively orally manipulate a bite of puree. Oral phase still prolonged with extended lingual pumping and inefficient oral transit followed by delayed, weak ineffective swallow response with sever vallecular residue (mild residue below CP noted). Pharyngeal mobility certainly impacted by large bore NG, however pt too lethargic to warrant removal today as  she will need ongoing alternate method of nutrition over the next 24 hours. Suggested removal of NG tomorrow with replacement of a Cortrak which would allow greater progress with diagnostic PO trials. WIll follow for readiness for PO vs further objective testing.  SLP Visit Diagnosis Dysphagia, unspecified (R13.10) Attention and concentration deficit following -- Frontal lobe and executive function deficit following -- Impact on safety and function Severe aspiration risk;Risk for inadequate nutrition/hydration   CHL IP TREATMENT RECOMMENDATION 08/26/2017 Treatment Recommendations Therapy as outlined in treatment plan below   Prognosis 08/26/2017 Prognosis for Safe Diet Advancement Guarded Barriers to Reach Goals Other (Comment) Barriers/Prognosis Comment -- CHL IP DIET RECOMMENDATION 08/26/2017 SLP Diet Recommendations NPO;Alternative means - temporary Liquid Administration via -- Medication Administration Via alternative means Compensations -- Postural Changes --   No flowsheet data found.  CHL IP FOLLOW UP RECOMMENDATIONS 08/26/2017 Follow up Recommendations Skilled Nursing facility   [facility] IP FREQUENCY AND DURATION 08/26/2017 Speech Therapy Frequency (ACUTE ONLY) min 2x/week Treatment Duration 2 weeks      CHL IP ORAL PHASE 08/26/2017 Oral Phase Impaired Oral - Pudding Teaspoon -- Oral - Pudding Cup -- Oral - Honey Teaspoon -- Oral - Honey Cup -- Oral - Nectar Teaspoon Lingual/palatal residue;Piecemeal swallowing;Decreased bolus cohesion;Delayed oral transit;Lingual pumping;Incomplete tongue to palate contact;Right anterior bolus loss;Left anterior bolus loss Oral - Nectar Cup -- Oral - Nectar Straw -- Oral - Thin Teaspoon -- Oral - Thin Cup -- Oral - Thin Straw -- Oral - Puree Lingual/palatal residue;Piecemeal swallowing;Decreased bolus cohesion;Delayed oral transit;Lingual pumping;Incomplete tongue to palate contact Oral - Mech Soft -- Oral - Regular -- Oral - Multi-Consistency -- Oral - Pill -- Oral Phase - Comment --  CHL  IP PHARYNGEAL PHASE 08/26/2017 Pharyngeal Phase Impaired Pharyngeal- Pudding Teaspoon -- Pharyngeal -- Pharyngeal- Pudding Cup -- Pharyngeal -- Pharyngeal- Honey Teaspoon -- Pharyngeal -- Pharyngeal- Honey Cup -- Pharyngeal -- Pharyngeal- Nectar Teaspoon Delayed swallow initiation-vallecula;Reduced epiglottic inversion;Reduced pharyngeal peristalsis;Reduced anterior laryngeal mobility;Reduced laryngeal elevation;Reduced tongue base retraction;Pharyngeal residue - valleculae;Pharyngeal residue - pyriform Pharyngeal -- Pharyngeal- Nectar Cup -- Pharyngeal -- Pharyngeal- Nectar Straw -- Pharyngeal -- Pharyngeal- Thin Teaspoon -- Pharyngeal -- Pharyngeal- Thin Cup -- Pharyngeal -- Pharyngeal- Thin Straw -- Pharyngeal -- Pharyngeal- Puree Delayed swallow initiation-vallecula;Reduced epiglottic inversion;Reduced pharyngeal peristalsis;Reduced anterior laryngeal mobility;Reduced laryngeal elevation;Reduced tongue base retraction;Pharyngeal residue - valleculae;Pharyngeal residue - pyriform Pharyngeal -- Pharyngeal- Mechanical Soft -- Pharyngeal -- Pharyngeal- Regular -- Pharyngeal -- Pharyngeal- Multi-consistency -- Pharyngeal -- Pharyngeal- Pill -- Pharyngeal -- Pharyngeal Comment --  No flowsheet data  found. DeBlois, Katherene Ponto 08/26/2017, 11:53 AM               Lab Data:  CBC: Recent Labs  Lab 08/23/17 0435 08/24/17 0400 08/24/17 0408 08/25/17 0503 08/25/17 1245 08/26/17 0441  WBC 8.1  --  8.1 7.5 7.7 9.0  HGB 8.3*  --  8.6* 7.6* 9.1* 9.3*  HCT 26.6* 26.7* 28.0* 24.9* 28.7* 29.7*  MCV 85.5  --  86.4 87.1 85.9 87.6  PLT 218  --  281 313 301 829   Basic Metabolic Panel: Recent Labs  Lab 08/20/17 0506  08/21/17 0453  08/23/17 0435 08/23/17 1324 08/24/17 0408 08/24/17 1643 08/25/17 0503 08/26/17 0441  NA 144   < > 144   < > 151* 153* 153* 151* 150* 152*  K 2.8*   < > 3.3*   < > 3.3* 3.5 3.5 3.2* 2.9* 3.9  CL 113*   < > 115*   < > 123* 128* 127* 125* 126* 130*  CO2 22   < > 18*   < > 19*  19* 19* 17* 18* 15*  GLUCOSE 172*   < > 223*   < > 198* 164* 225* 284* 216* 233*  BUN 51*   < > 49*   < > 43* 43* 42* 42* 40* 37*  CREATININE 1.63*   < > 1.60*   < > 1.54* 1.50* 1.56* 1.52* 1.49* 1.41*  CALCIUM 7.0*   < > 7.3*   < > 7.7* 7.7* 7.9* 7.7* 7.7* 7.9*  MG 1.5*  --  1.8  --  2.0  --  1.8  --  2.2  --   PHOS  --   --   --   --   --   --  3.5  --  3.3  --    < > = values in this interval not displayed.   GFR: Estimated Creatinine Clearance: 31.8 mL/min (A) (by C-G formula based on SCr of 1.41 mg/dL (H)). Liver Function Tests: Recent Labs  Lab 08/20/17 0920 08/24/17 0408 08/25/17 0503 08/26/17 0441  AST 20 21 16 19   ALT 11 18 14 15   ALKPHOS 123 118 95 99  BILITOT 0.9 0.8 0.6 0.8  PROT 3.6* 4.7* 4.3* 4.7*  ALBUMIN <1.0* 1.2* 1.1* 1.3*   No results for input(s): LIPASE, AMYLASE in the last 168 hours. No results for input(s): AMMONIA in the last 168 hours. Coagulation Profile: No results for input(s): INR, PROTIME in the last 168 hours. Cardiac Enzymes: No results for input(s): CKTOTAL, CKMB, CKMBINDEX, TROPONINI in the last 168 hours. BNP (last 3 results) No results for input(s): PROBNP in the last 8760 hours. HbA1C: No results for input(s): HGBA1C in the last 72 hours. CBG: Recent Labs  Lab 08/25/17 1528 08/25/17 1946 08/25/17 2326 08/26/17 0307 08/26/17 0757  GLUCAP 155* 114* 104* 168* 204*   Lipid Profile: No results for input(s): CHOL, HDL, LDLCALC, TRIG, CHOLHDL, LDLDIRECT in the last 72 hours. Thyroid Function Tests: No results for input(s): TSH, T4TOTAL, FREET4, T3FREE, THYROIDAB in the last 72 hours. Anemia Panel: Recent Labs    08/24/17 0408  VITAMINB12 1,451*  FERRITIN 198  TIBC 151*  IRON 20*  RETICCTPCT 1.4   Urine analysis:    Component Value Date/Time   COLORURINE AMBER (A) 08/11/2017 0521   APPEARANCEUR CLOUDY (A) 08/11/2017 0521   LABSPEC 1.019 08/11/2017 0521   PHURINE 5.0 08/11/2017 0521   GLUCOSEU NEGATIVE 08/11/2017 0521    HGBUR SMALL (A) 08/11/2017 9371  BILIRUBINUR SMALL (A) 08/11/2017 0521   KETONESUR NEGATIVE 08/11/2017 0521   PROTEINUR 30 (A) 08/11/2017 0521   UROBILINOGEN 0.2 10/17/2014 0849   NITRITE NEGATIVE 08/11/2017 0521   LEUKOCYTESUR LARGE (A) 08/11/2017 0521     Sakai Wolford M.D. [facility] 08/26/2017, 12:12 PM  Pager: [phone] Between 7am to 7pm - call Pager - [phone]  After 7pm go to [email or web address] - password [email or web address]  Call night coverage person covering after 7pm

## 2017-08-26 NOTE — Care Management Important Message (Signed)
Important Message  Patient Details  Name: Judith [name] MRN: [id number] Date of Birth: [birth year]   Medicare Important Message Given:  Yes    Orbie Pyo 08/26/2017, 1:47 PM

## 2017-08-26 NOTE — Progress Notes (Signed)
Nutrition Follow-up  DOCUMENTATION CODES:   Obesity unspecified  INTERVENTION:   -Continue Vital AF 1.2 @ 60 ml/hr via NGT  Tube feeding regimen provides 1728 kcal (100% of needs), 108 grams of protein, and 1168 ml of H2O.   NUTRITION DIAGNOSIS:   Inadequate oral intake related to inability to eat as evidenced by NPO status.  Ongoing  GOAL:   Patient will meet greater than or equal to 90% of their needs  Met with TF  MONITOR:   Labs, I & O's, Skin, TF tolerance  REASON FOR ASSESSMENT:   Consult Enteral/tube feeding initiation and management, Assessment of nutrition requirement/status  ASSESSMENT:   77 yo female with PMH of DM, HLD, asthma, GERD, Charcot's joint, R BKA, HLD, and HTN who was admitted on 7/16 with septic shock and pneumoperitoneum; found to have perforated proximal descending colon. S/P extended L hemicolectomy with distal transverse colostomy and mucous fistula creation on 7/17.   7/25- s/p one way extubation 7/31- transferred from ICU to floor 8/1- s/p MBSS, recommend continued NPO status  Case discussed with RN prior to visit, who reports pt with fluctuating mentation today (RN states that in report yesterday pt was alert enough to eat some applesauce). She confirms pt failed MBSS today due to AMS and plan to place cortrak tube tomorrow (08/27/17).   Pt lying in moving around, but did not respond to questions. Pt squeezed this RD's hand when RD touched to assess ID bracelet.   Pt remains on TF via NGT- Vital AF 1.2 infusing at 60 ml/hr, which provides 1728 kcals, 108 grams protein, and 1168 ml free water daily, meeting 100% of estimated energy needs.   Palliative care continues to follow for goals of care discussions. Eventual plan is back to SNF with hospice services if pt family agreeable.   Labs reviewed: Na: 152, CBGS: 168-236 (inpatient orders for glycemic control are 0-15 units insulin aspart every 4 hours and 5 units insulin glargine daily).    Diet Order:   Diet Order           Diet NPO time specified  Diet effective now          EDUCATION NEEDS:   No education needs have been identified at this time  Skin:  Skin Assessment: Skin Integrity Issues: Skin Integrity Issues:: Diabetic Ulcer, Incisions Diabetic Ulcer: L foot Incisions: abdomen  Last BM:  08/26/17 (900 ml via colostomy)  Height:   Ht Readings from Last 1 Encounters:  08/11/17 5' (1.524 m)    Weight:   Wt Readings from Last 1 Encounters:  08/26/17 187 lb 6.3 oz (85 kg)    Ideal Body Weight:  42.5 kg  BMI:  Body mass index is 36.6 kg/m.  Estimated Nutritional Needs:   Kcal:  1550-1750  Protein:  90-110 gm  Fluid:  1.6-1.8 L    Kayne Yuhas A. Jimmye Norman, RD, LDN, CDE Pager: [phone] After hours Pager: [phone]

## 2017-08-26 NOTE — Progress Notes (Addendum)
[facility] Progress Note  16 Days Post-Op  Subjective: CC:  Not arousing to voice or pain this AM.   Objective: Vital signs in last 24 hours: Temp:  [97.4 F (36.3 C)-98.4 F (36.9 C)] 98.4 F (36.9 C) (07/31 2146) Pulse Rate:  [73-105] 93 (07/31 2146) Resp:  [17-29] 18 (07/31 2146) BP: (80-140)/(37-69) 100/57 (07/31 2146) SpO2:  [96 %-100 %] 100 % (07/31 2146) Weight:  [85 kg (187 lb 6.3 oz)] 85 kg (187 lb 6.3 oz) (08/01 0500) Last BM Date: 08/25/17  Intake/Output from previous day: 07/31 0701 - 08/01 0700 In: 2831.1 [P.O.:60; I.V.:296.6; Blood:362.5; NG/GT:2112] Out: 3700 [Urine:1700; Stool:2000] Intake/Output this shift: No intake/output data recorded.  PE: Gen:  somnolent, unresponsive, NAD Card:  Regular rate and rhythm Pulm:  Normal effort, clear to auscultation bilaterally Abd: Soft, non-tender, non-distended, midline incisions with dressing clean and dry, ostomy and mucous fistula viable with stool in pouch.   NG tube in place, TF running at 60 cc/hr Skin: warm and dry, no rashes  Psych: unable to assess Neuro: not opening eyes to sternal rub or pain. Non-verbal this AM.  Lab Results:  Recent Labs    08/25/17 1245 08/26/17 0441  WBC 7.7 9.0  HGB 9.1* 9.3*  HCT 28.7* 29.7*  PLT 301 336   BMET Recent Labs    08/25/17 0503 08/26/17 0441  NA 150* 152*  K 2.9* 3.9  CL 126* 130*  CO2 18* 15*  GLUCOSE 216* 233*  BUN 40* 37*  CREATININE 1.49* 1.41*  CALCIUM 7.7* 7.9*   PT/INR No results for input(s): LABPROT, INR in the last 72 hours. CMP     Component Value Date/Time   NA 152 (H) 08/26/2017 0441   K 3.9 08/26/2017 0441   CL 130 (H) 08/26/2017 0441   CO2 15 (L) 08/26/2017 0441   GLUCOSE 233 (H) 08/26/2017 0441   BUN 37 (H) 08/26/2017 0441   CREATININE 1.41 (H) 08/26/2017 0441   CALCIUM 7.9 (L) 08/26/2017 0441   PROT 4.7 (L) 08/26/2017 0441   ALBUMIN 1.3 (L) 08/26/2017 0441   AST 19 08/26/2017 0441   ALT 15 08/26/2017 0441    ALKPHOS 99 08/26/2017 0441   BILITOT 0.8 08/26/2017 0441   GFRNONAA 35 (L) 08/26/2017 0441   GFRAA 40 (L) 08/26/2017 0441   Lipase     Component Value Date/Time   LIPASE 70 (H) 08/12/2017 1954       Studies/Results: No results found.  Anti-infectives: Anti-infectives (From admission, onward)   Start     Dose/Rate Route Frequency Ordered Stop   08/18/17 1200  anidulafungin (ERAXIS) 100 mg in sodium chloride 0.9 % 100 mL IVPB     100 mg 78 mL/hr over 100 Minutes Intravenous Every 24 hours 08/18/17 0907 08/20/17 1432   08/18/17 1000  piperacillin-tazobactam (ZOSYN) IVPB 3.375 g     3.375 g 12.5 mL/hr over 240 Minutes Intravenous Every 8 hours 08/18/17 0907 08/20/17 2250   08/13/17 1100  anidulafungin (ERAXIS) 100 mg in sodium chloride 0.9 % 100 mL IVPB  Status:  Discontinued     100 mg 78 mL/hr over 100 Minutes Intravenous Every 24 hours 08/12/17 1048 08/17/17 1308   08/12/17 1100  anidulafungin (ERAXIS) 200 mg in sodium chloride 0.9 % 200 mL IVPB     200 mg 78 mL/hr over 200 Minutes Intravenous  Once 08/12/17 1048 08/12/17 1648   08/11/17 0600  piperacillin-tazobactam (ZOSYN) IVPB 3.375 g     3.375 g 12.5 mL/hr  over 240 Minutes Intravenous Every 8 hours 08/11/17 0025 08/17/17 2359   08/11/17 0115  vancomycin (VANCOCIN) IVPB 750 mg/150 ml premix     750 mg 150 mL/hr over 60 Minutes Intravenous Every 24 hours 08/11/17 0110 08/17/17 0211   08/11/17 0045  piperacillin-tazobactam (ZOSYN) IVPB 3.375 g     3.375 g 100 mL/hr over 30 Minutes Intravenous  Once 08/11/17 0033 08/11/17 0206   08/25/2017 2000  piperacillin-tazobactam (ZOSYN) IVPB 3.375 g  Status:  Discontinued     3.375 g 100 mL/hr over 30 Minutes Intravenous  Once 08/14/2017 1952 08/11/17 0025     Assessment/Plan s/p Procedure(s): EXPLORATORY LAPAROTOMY FOR FREE AIR, LEFT EXTENDED HEMICOLECTOMY, COLOSTOMY AND MUCUS FISTULA (N/A)  Perforated proximal descending colon Extended left hemicolectomy with distal transverse  colostomy and mucous fistula creation, 08/11/17,Dr Kae Heller. POD#15 -TFs atgoal rate and tolerating well, SLP saw pt and plan MBS today -BID midline dressing changesand prn for saturation from serous drainage -WOC RN for ostomy care, currently using small eakin pouch to bedside drainage. -WBCnormal, antibiotic course complete.  Sepsis-secondary to #1,patientoff pressors, abx therapydiscontinued7/26  Thrombocytopenia- resolved VDRF- extubated 6:30 PM 7/25 - one way extubation per family, stable  DM- SSI for now  HTN- resolved,BB on hold  AKI on CKD stage III-Creatinine1.41 from 1.56  Hypokalemia -improved, 3.9 this AM  Severe malnutrition - tube feeds/Speech evaluation for swallowing  ABL anemia- H/H 9.3/29.7 s/p transfusion 2u pRBC 7/31  Paroxysmal A.Fib- rate controlled   Hx of R BKA secondary to peripheral vascular disease- L toes dark, good doppler pulse, continue current care  Acute metabolic encephalopathy- multifactoral, followed simple commands this AM  Hypernatremia- sodium 152, serum osmolality 327; free water increased, continue to monitor  Goals of care - palliative following and greatly appreciate their support; PMT meeting tentatively scheduled for 8/7 at 3:00 pm. Pt remains DNR, No PEG, No invasive procedures. Anticipate D/C to SNF for long term care with Hospice at some point.  FEN -SLP planning to perform MBS today, saw note to D/C NG. Patient less responsive today so I am going to continue NG for now. Hopefully if pt progresses we can D/C soon, plan to place cortrak tomorrow. VTE - SCDs, Heparin previously held for low platelets/anemia, if hemodynamically stable will resume chemical VTE tomorrow ID -Zosyn 7/16-->Vanc 7/16-->Eraxis 7/18 -->all abx stopped 08/17/17.(Calcitonin 32) Zosyn/Eraxis restarted 7/24>>7/26    LOS: 16 days    Jill Alexanders , [facility] 08/26/2017, 8:32 AM Pager:  [phone] Consults: [phone] Mon-Fri 7:00 am-4:30 pm Sat-Sun 7:00 am-11:30 am

## 2017-08-26 NOTE — Plan of Care (Signed)
  Problem: Activity: Goal: Risk for activity intolerance will decrease Outcome: Progressing   Problem: Nutrition: Goal: Adequate nutrition will be maintained Outcome: Progressing   Problem: Elimination: Goal: Will not experience complications related to bowel motility Outcome: Progressing   Problem: Skin Integrity: Goal: Risk for impaired skin integrity will decrease Outcome: Progressing   

## 2017-08-26 DEATH — deceased

## 2017-08-27 LAB — GLUCOSE, CAPILLARY
GLUCOSE-CAPILLARY: 202 mg/dL — AB (ref 70–99)
GLUCOSE-CAPILLARY: 222 mg/dL — AB (ref 70–99)
GLUCOSE-CAPILLARY: 258 mg/dL — AB (ref 70–99)
GLUCOSE-CAPILLARY: 260 mg/dL — AB (ref 70–99)
Glucose-Capillary: 163 mg/dL — ABNORMAL HIGH (ref 70–99)

## 2017-08-27 LAB — BASIC METABOLIC PANEL
BUN: 36 mg/dL — AB (ref 8–23)
CALCIUM: 7.9 mg/dL — AB (ref 8.9–10.3)
CO2: 15 mmol/L — AB (ref 22–32)
CREATININE: 1.53 mg/dL — AB (ref 0.44–1.00)
Chloride: >130 mmol/L (ref 98–111)
GFR calc Af Amer: 36 mL/min — ABNORMAL LOW (ref >60)
GFR, EST NON AFRICAN AMERICAN: 31 mL/min — AB (ref >60)
GLUCOSE: 202 mg/dL — AB (ref 70–99)
Potassium: 3.2 mmol/L — ABNORMAL LOW (ref 3.5–5.1)
Sodium: 155 mmol/L — ABNORMAL HIGH (ref 135–145)

## 2017-08-27 MED ORDER — DEXTROSE 5 % IV SOLN
INTRAVENOUS | Status: DC
  Administered 2017-08-27 – 2017-08-28 (×2): via INTRAVENOUS

## 2017-08-27 MED ORDER — DILTIAZEM HCL 60 MG PO TABS
60.0000 mg | ORAL_TABLET | Freq: Two times a day (BID) | ORAL | Status: DC
  Administered 2017-08-27 – 2017-09-01 (×10): 60 mg via ORAL
  Filled 2017-08-27 (×11): qty 1

## 2017-08-27 MED ORDER — POTASSIUM CHLORIDE 20 MEQ/15ML (10%) PO SOLN
40.0000 meq | Freq: Once | ORAL | Status: AC
  Administered 2017-08-27: 40 meq via ORAL
  Filled 2017-08-27: qty 30

## 2017-08-27 NOTE — Progress Notes (Signed)
  Speech Language Pathology Treatment: Dysphagia  Patient Details Name: Judith [name] MRN: [id number] DOB: [birth year] Today's Date: 08/27/2017 Time: 6144-3154 SLP Time Calculation (min) (ACUTE ONLY): 18 min  Assessment / Plan / Recommendation Clinical Impression  Pt seen for skilled treatment with PO trials of ice chips, puree and water via 1/3 tsp amounts with oral holding, decreased oral manipulation/propulsion, delay in the initiation of the swallow (mild-mod depending on consistency), multiple swallows and no throat clearing noted this date which is an improvement from last session; pt alert, but non-verbal; responds with inconsistent head nods during intake; pt with open mouth posture entire session, but doesn't appear in distress/pain via faces assessment.  Cortrak placed this am which may improve swallowing efficiency/ability d/t large bore NG removal; continue TF for nutrition/hydration and NPO status until pt improves with swallow function and/or is able to repeat objective testing for diet recommendation.  ST will continue to f/u during acute stay for dysphagia management.  HPI HPI: 77 y.o. female  with past medical history of IDDM, charcot's joint, dementia, anxiety, proctocolitis, PUD, right BKA, asthma, and arthritis who was admitted on 08/20/2017 with septic shock.  She was found to have a bowel perforation and was taken to the OR.  She received a hemicolectomy with colostomy and mucous fistula. Intubated 08/11/17 with one-way extubation on 08/19/17. Per palliative, prognosis <2 weeks in setting of hypotensive with Afib, severely deconditioned, malnourished, demented, with a large abdominal wound. No tracheostomy, no PEG.       SLP Plan  Continue with current plan of care       Recommendations  Diet recommendations: NPO Medication Administration: Via alternative means                Oral Care Recommendations: Oral care QID Follow up Recommendations: Skilled Nursing facility SLP  Visit Diagnosis: Dysphagia, unspecified (R13.10) Plan: Continue with current plan of care                       Elvina Sidle, M.S., CCC-SLP 08/27/2017, 10:49 AM

## 2017-08-27 NOTE — Progress Notes (Addendum)
Cortrak Tube Team Note:  Consult received to place a Cortrak feeding tube.  A 10 F Cortrak tube was placed in the LEFT nare and secured with a nasal bridle at 69 cm. Per the Cortrak monitor reading the tube tip is post-pyloric.  No x-ray is required. RN may begin using tube.   If the tube becomes dislodged please keep the tube and contact the Cortrak team at [email or web address] (password [email or web address]) for replacement.  If after hours and replacement cannot be delayed, place a NG tube and confirm placement with an abdominal x-ray.    Gaynell Face, MS, RD, LDN Pager: [phone] Weekend/After Hours: [phone]

## 2017-08-27 NOTE — Clinical Social Work Note (Signed)
CSW continuing to follow patient's progress, provide SW intervention services as needed and will facilitate discharge back to [facility] once medically stable.  Markel Mergenthaler Givens, MSW, LCSW Licensed Clinical Social Worker [place] [phone]

## 2017-08-27 NOTE — Progress Notes (Signed)
Nutrition Follow-up  DOCUMENTATION CODES:   Obesity unspecified  INTERVENTION:   -Continue Vital AF 1.2 @ 60 ml/hr via cortrak tube  -Continue 250 ml free water flush QID  Tube feeding regimen provides 1728 kcal (100% of needs), 108 grams of protein, and 1168 ml of H2O. Total free water: 2168 ml.    NUTRITION DIAGNOSIS:   Inadequate oral intake related to inability to eat as evidenced by NPO status.  Ongoing   GOAL:   Patient will meet greater than or equal to 90% of their needs  Met with TF  MONITOR:   Labs, I & O's, Skin, TF tolerance  REASON FOR ASSESSMENT:   Consult Enteral/tube feeding initiation and management, Assessment of nutrition requirement/status  ASSESSMENT:   77 yo female with PMH of DM, HLD, asthma, GERD, Charcot's joint, R BKA, HLD, and HTN who was admitted on 7/16 with septic shock and pneumoperitoneum; found to have perforated proximal descending colon. S/P extended L hemicolectomy with distal transverse colostomy and mucous fistula creation on 7/17.   7/25- s/p one way extubation 7/31- transferred from ICU to floor 8/1- s/p MBSS, recommend continued NPO status 8/2- NGT removed, cortrak tube placed (gastric placement confirmed), s/p BSE- continue to recommend NPO status due to delayed swallowing and oral holding  Case discussed with RD from cortrak tube team; cortrak tube has been placed and achieved gastric placement. Pt tolerated procedure well.   Pt remains on TF via NGT- Vital AF 1.2 infusing at 60 ml/hr, which provides 1728 kcals, 108 grams protein, and 1168 ml free water daily, meeting 100% of estimated energy needs.   Palliative care continues to follow for goals of care discussions. Eventual plan is back to SNF with hospice services if pt family agreeable.   Labs reviewed: Na: 155, K: 3.2, CBGS: 202-260 (inpatient orders for glycemic control are 0-15 units insulin aspart TID with meals and 5 units insulin glargine daily).  Diet Order:    Diet Order           Diet NPO time specified  Diet effective now          EDUCATION NEEDS:   No education needs have been identified at this time  Skin:  Skin Assessment: Skin Integrity Issues: Skin Integrity Issues:: Diabetic Ulcer, Incisions Diabetic Ulcer: L foot Incisions: abdomen  Last BM:  08/26/17 (50 ml output via colostomy)  Height:   Ht Readings from Last 1 Encounters:  08/11/17 5' (1.524 m)    Weight:   Wt Readings from Last 1 Encounters:  08/27/17 189 lb 13.1 oz (86.1 kg)    Ideal Body Weight:  42.5 kg  BMI:  Body mass index is 37.07 kg/m.  Estimated Nutritional Needs:   Kcal:  1550-1750  Protein:  90-110 gm  Fluid:  1.6-1.8 L    Brittinie Wherley A. Jimmye Norman, RD, LDN, CDE Pager: [phone] After hours Pager: [phone]

## 2017-08-27 NOTE — Progress Notes (Signed)
Ref: System, Pcp Not In   Subjective:  Awakens on calling. VS stable. Heart rate in 80 to 100 range. Soft BP.  Objective:  Vital Signs in the last 24 hours: Temp:  [98.4 F (36.9 C)-99.8 F (37.7 C)] 98.5 F (36.9 C) (08/02 1257) Pulse Rate:  [84-98] 85 (08/02 1257) Cardiac Rhythm: Normal sinus rhythm (08/02 0737) Resp:  [18-36] 18 (08/02 1257) BP: (103-119)/(42-72) 107/72 (08/02 1257) SpO2:  [100 %] 100 % (08/02 1257) Weight:  [86.1 kg (189 lb 13.1 oz)] 86.1 kg (189 lb 13.1 oz) (08/02 0756)  Physical Exam: BP Readings from Last 1 Encounters:  08/27/17 107/72     Wt Readings from Last 1 Encounters:  08/27/17 86.1 kg (189 lb 13.1 oz)    Weight change:  Body mass index is 37.07 kg/m. HEENT: [place]/AT, Eyes-Brown, Conjunctiva-Pale, Sclera-Non-icteric Neck: No JVD, No bruit, Trachea midline. Lungs:  Clear, Bilateral. Cardiac:  Regular rhythm, normal S1 and S2, no S3. II/VI systolic murmur. Abdomen:  Soft, non-tender. BS present. Extremities:  1+ edema present. No cyanosis. No clubbing. CNS: AxOx1, Cranial nerves grossly intact, moves all 4 extremities. Right BKA. Skin: Warm and dry.   Intake/Output from previous day: 08/01 0701 - 08/02 0700 In: -  Out: 2050 [Urine:750; Stool:1300]    Lab Results: BMET    Component Value Date/Time   NA 155 (H) 08/27/2017 0800   NA 152 (H) 08/26/2017 0441   NA 150 (H) 08/25/2017 0503   K 3.2 (L) 08/27/2017 0800   K 3.9 08/26/2017 0441   K 2.9 (L) 08/25/2017 0503   CL >130 (HH) 08/27/2017 0800   CL 130 (H) 08/26/2017 0441   CL 126 (H) 08/25/2017 0503   CO2 15 (L) 08/27/2017 0800   CO2 15 (L) 08/26/2017 0441   CO2 18 (L) 08/25/2017 0503   GLUCOSE 202 (H) 08/27/2017 0800   GLUCOSE 233 (H) 08/26/2017 0441   GLUCOSE 216 (H) 08/25/2017 0503   BUN 36 (H) 08/27/2017 0800   BUN 37 (H) 08/26/2017 0441   BUN 40 (H) 08/25/2017 0503   CREATININE 1.53 (H) 08/27/2017 0800   CREATININE 1.41 (H) 08/26/2017 0441   CREATININE 1.49 (H)  08/25/2017 0503   CALCIUM 7.9 (L) 08/27/2017 0800   CALCIUM 7.9 (L) 08/26/2017 0441   CALCIUM 7.7 (L) 08/25/2017 0503   GFRNONAA 31 (L) 08/27/2017 0800   GFRNONAA 35 (L) 08/26/2017 0441   GFRNONAA 32 (L) 08/25/2017 0503   GFRAA 36 (L) 08/27/2017 0800   GFRAA 40 (L) 08/26/2017 0441   GFRAA 38 (L) 08/25/2017 0503   CBC    Component Value Date/Time   WBC 9.0 08/26/2017 0441   RBC 3.39 (L) 08/26/2017 0441   HGB 9.3 (L) 08/26/2017 0441   HCT 29.7 (L) 08/26/2017 0441   HCT 26.7 (L) 08/24/2017 0400   PLT 336 08/26/2017 0441   MCV 87.6 08/26/2017 0441   MCH 27.4 08/26/2017 0441   MCHC 31.3 08/26/2017 0441   RDW 20.0 (H) 08/26/2017 0441   LYMPHSABS 2.4 08/09/2017 1946   MONOABS 0.1 08/21/2017 1946   EOSABS 0.0 07/26/2017 1946   BASOSABS 0.1 08/12/2017 1946   HEPATIC Function Panel Recent Labs    08/24/17 0408 08/25/17 0503 08/26/17 0441  PROT 4.7* 4.3* 4.7*   HEMOGLOBIN A1C No components found for: HGA1C,  MPG CARDIAC ENZYMES Lab Results  Component Value Date   CKTOTAL 93 04/28/2014   CKMB 1.6 11/27/2010   BNP No results for input(s): PROBNP in the last 8760 hours.  TSH Recent Labs    04/23/17 2215  TSH 0.543   CHOLESTEROL No results for input(s): CHOL in the last 8760 hours.  Scheduled Meds: . chlorhexidine  15 mL Mouth Rinse BID  . diltiazem  60 mg Oral Q12H  . ferrous sulfate  300 mg Per Tube BID WC  . free water  250 mL Per Tube QID  . heparin injection (subcutaneous)  5,000 Units Subcutaneous Q8H  . insulin aspart  0-15 Units Subcutaneous Q4H  . insulin glargine  5 Units Subcutaneous Daily  . mouth rinse  15 mL Mouth Rinse q12n4p  . metoprolol tartrate  12.5 mg Oral BID  . pantoprazole sodium  40 mg Per Tube Daily   Continuous Infusions: . dextrose 60 mL/hr at 08/27/17 1418  . feeding supplement (VITAL AF 1.2 CAL) 60 mL/hr at 08/27/17 0826   PRN Meds:.acetaminophen (TYLENOL) oral liquid 160 mg/5 mL, albuterol, hydroxypropyl methylcellulose /  hypromellose, midazolam, sodium chloride, sodium chloride flush  Assessment/Plan: Paroxysmal atrial fibrillation S/P large bowel perforation with surgery Anemia of blood loss and chronic dusease Right BKA Acute kidney injury, stable Altered mental status, minimally improving Severe hypoalbuminemia Hypernatremia and hypercchloridemia  Increase diltiazem marginally only. Agree with increasing free fluid intake.   LOS: 17 days    [name]  MD  08/27/2017, 4:39 PM

## 2017-08-27 NOTE — Progress Notes (Signed)
[facility] Progress Note  17 Days Post-Op  Subjective: CC: no major events. Dietician at [facility] placing cortrak  Objective: Vital signs in last 24 hours: Temp:  [97.9 F (36.6 C)-99.8 F (37.7 C)] 99.8 F (37.7 C) (08/02 0425) Pulse Rate:  [84-98] 98 (08/02 0425) Resp:  [22-36] 24 (08/02 0425) BP: (103-119)/(42-59) 115/42 (08/02 0425) SpO2:  [100 %] 100 % (08/02 0425) Weight:  [86.1 kg (189 lb 13.1 oz)] 86.1 kg (189 lb 13.1 oz) (08/02 0756) Last BM Date: 08/26/17  Intake/Output from previous day: 08/01 0701 - 08/02 0700 In: -  Out: 2050 [Urine:750; Stool:1300] Intake/Output this shift: Total I/O In: 10 [I.V.:10] Out: -   PE: Gen:  awake, eyes open, no follow commands, NAD Card:  Regular rate and rhythm Pulm:  Normal effort, clear to auscultation bilaterally Abd: Soft, non-tender, non-distended, midline incisions with dressing clean and dry, ostomy and mucous fistula viable with stool in pouch.             Cortrak just placed Skin: warm and dry, no rashes  Psych: unable to assess Neuro: not opening eyes to sternal rub or pain. Non-verbal this AM.  Lab Results:  Recent Labs    08/25/17 1245 08/26/17 0441  WBC 7.7 9.0  HGB 9.1* 9.3*  HCT 28.7* 29.7*  PLT 301 336   BMET Recent Labs    08/26/17 0441 08/27/17 0800  NA 152* 155*  K 3.9 3.2*  CL 130* >130*  CO2 15* 15*  GLUCOSE 233* 202*  BUN 37* 36*  CREATININE 1.41* 1.53*  CALCIUM 7.9* 7.9*   PT/INR No results for input(s): LABPROT, INR in the last 72 hours. CMP     Component Value Date/Time   NA 155 (H) 08/27/2017 0800   K 3.2 (L) 08/27/2017 0800   CL >130 (HH) 08/27/2017 0800   CO2 15 (L) 08/27/2017 0800   GLUCOSE 202 (H) 08/27/2017 0800   BUN 36 (H) 08/27/2017 0800   CREATININE 1.53 (H) 08/27/2017 0800   CALCIUM 7.9 (L) 08/27/2017 0800   PROT 4.7 (L) 08/26/2017 0441   ALBUMIN 1.3 (L) 08/26/2017 0441   AST 19 08/26/2017 0441   ALT 15 08/26/2017 0441   ALKPHOS 99 08/26/2017 0441    BILITOT 0.8 08/26/2017 0441   GFRNONAA 31 (L) 08/27/2017 0800   GFRAA 36 (L) 08/27/2017 0800   Lipase     Component Value Date/Time   LIPASE 70 (H) 08/02/2017 1954       Studies/Results: Dg Swallowing Func-speech Pathology  Result Date: 08/26/2017 Objective Swallowing Evaluation: Type of Study: MBS-Modified Barium Swallow Study  Patient Details Name: Judith [name] MRN: [id number] Date of Birth: April 24, 1940 Today's Date: 08/26/2017 Time: SLP Start Time (ACUTE ONLY): 1015 -SLP Stop Time (ACUTE ONLY): 1030 SLP Time Calculation (min) (ACUTE ONLY): 15 min Past Medical History: Past Medical History: Diagnosis Date . Anxiety  . Arthritis   "left leg" (05/17/2013) . Asthma  . Charcot's joint of foot due to diabetes ([place])  . Closed right ankle fracture  . Colitis 09/06/02 . Diabetes mellitus without complication ([place])  . Fracture of femur, distal, right, closed ([place]) 03/01/2012 . GERD (gastroesophageal reflux disease)  . IRCVELFY(101.7)   "probably weekly" (05/17/2013) . Hemorrhoid  . High cholesterol  . History of stomach ulcers  . Hyperlipidemia  . Hypertension  . Insulin dependent diabetes mellitus ([place]) 03/01/2012 . Obesity (BMI 30-39.9) 03/03/2012 . Perforated viscus 08/14/2017 . Pneumonia   "once" (05/17/2013) . Proctocolitis 09/04/02 Past Surgical History: Past  Surgical History: Procedure Laterality Date . ABDOMINAL HYSTERECTOMY   . BELOW KNEE LEG AMPUTATION Right 02/2008 . BREAST LUMPECTOMY Bilateral   "not cancer" . CATARACT EXTRACTION W/ INTRAOCULAR LENS  IMPLANT, BILATERAL Bilateral  . LAPAROTOMY N/A 08/11/2017  Procedure: EXPLORATORY LAPAROTOMY FOR FREE AIR, LEFT EXTENDED HEMICOLECTOMY, COLOSTOMY AND MUCUS FISTULA;  Surgeon: Clovis Riley, MD;  Location: [facility];  Service: General;  Laterality: N/A; . ORIF FEMUR FRACTURE  03/01/2012  Procedure: OPEN REDUCTION INTERNAL FIXATION (ORIF) DISTAL FEMUR FRACTURE;  Surgeon: Rozanna Box, MD;  Location: [place] [facility];  Service: Orthopedics;  Laterality: Right;  ORIF right femur HPI:  77 y.o. female  with past medical history of IDDM, charcot's joint, dementia, anxiety, proctocolitis, PUD, right BKA, asthma, and arthritis who was admitted on 08/12/2017 with septic shock.  She was found to have a bowel perforation and was taken to the OR.  She received a hemicolectomy with colostomy and mucous fistula. Intubated 08/11/17 with one-way extubation on 08/19/17. Per palliative, prognosis <2 weeks in setting of hypotensive with Afib, severely deconditioned, malnourished, demented, with a large abdominal wound. No tracheostomy, no PEG.  Subjective: weakly states name Assessment / Plan / Recommendation CHL IP CLINICAL IMPRESSIONS 08/26/2017 Clinical Impression Difficult study, pt lethargic but opened eyes and responded to PO presentation. Initially pt became drowsy while orally holding PO, requiring deep suction without gag. After this however, pt woke up enough to more actively orally manipulate a bite of puree. Oral phase still prolonged with extended lingual pumping and inefficient oral transit followed by delayed, weak ineffective swallow response with sever vallecular residue (mild residue below CP noted). Pharyngeal mobility certainly impacted by large bore NG, however pt too lethargic to warrant removal today as she will need ongoing alternate method of nutrition over the next 24 hours. Suggested removal of NG tomorrow with replacement of a Cortrak which would allow greater progress with diagnostic PO trials. WIll follow for readiness for PO vs further objective testing.  SLP Visit Diagnosis Dysphagia, unspecified (R13.10) Attention and concentration deficit following -- Frontal lobe and executive function deficit following -- Impact on safety and function Severe aspiration risk;Risk for inadequate nutrition/hydration   CHL IP TREATMENT RECOMMENDATION 08/26/2017 Treatment Recommendations Therapy as outlined in treatment plan below   Prognosis 08/26/2017 Prognosis for Safe Diet Advancement Guarded Barriers  to Reach Goals Other (Comment) Barriers/Prognosis Comment -- CHL IP DIET RECOMMENDATION 08/26/2017 SLP Diet Recommendations NPO;Alternative means - temporary Liquid Administration via -- Medication Administration Via alternative means Compensations -- Postural Changes --   No flowsheet data found.  CHL IP FOLLOW UP RECOMMENDATIONS 08/26/2017 Follow up Recommendations Skilled Nursing facility   [facility] IP FREQUENCY AND DURATION 08/26/2017 Speech Therapy Frequency (ACUTE ONLY) min 2x/week Treatment Duration 2 weeks      CHL IP ORAL PHASE 08/26/2017 Oral Phase Impaired Oral - Pudding Teaspoon -- Oral - Pudding Cup -- Oral - Honey Teaspoon -- Oral - Honey Cup -- Oral - Nectar Teaspoon Lingual/palatal residue;Piecemeal swallowing;Decreased bolus cohesion;Delayed oral transit;Lingual pumping;Incomplete tongue to palate contact;Right anterior bolus loss;Left anterior bolus loss Oral - Nectar Cup -- Oral - Nectar Straw -- Oral - Thin Teaspoon -- Oral - Thin Cup -- Oral - Thin Straw -- Oral - Puree Lingual/palatal residue;Piecemeal swallowing;Decreased bolus cohesion;Delayed oral transit;Lingual pumping;Incomplete tongue to palate contact Oral - Mech Soft -- Oral - Regular -- Oral - Multi-Consistency -- Oral - Pill -- Oral Phase - Comment --  CHL IP PHARYNGEAL PHASE 08/26/2017 Pharyngeal Phase Impaired Pharyngeal- Pudding Teaspoon --  Pharyngeal -- Pharyngeal- Pudding Cup -- Pharyngeal -- Pharyngeal- Honey Teaspoon -- Pharyngeal -- Pharyngeal- Honey Cup -- Pharyngeal -- Pharyngeal- Nectar Teaspoon Delayed swallow initiation-vallecula;Reduced epiglottic inversion;Reduced pharyngeal peristalsis;Reduced anterior laryngeal mobility;Reduced laryngeal elevation;Reduced tongue base retraction;Pharyngeal residue - valleculae;Pharyngeal residue - pyriform Pharyngeal -- Pharyngeal- Nectar Cup -- Pharyngeal -- Pharyngeal- Nectar Straw -- Pharyngeal -- Pharyngeal- Thin Teaspoon -- Pharyngeal -- Pharyngeal- Thin Cup -- Pharyngeal -- Pharyngeal- Thin  Straw -- Pharyngeal -- Pharyngeal- Puree Delayed swallow initiation-vallecula;Reduced epiglottic inversion;Reduced pharyngeal peristalsis;Reduced anterior laryngeal mobility;Reduced laryngeal elevation;Reduced tongue base retraction;Pharyngeal residue - valleculae;Pharyngeal residue - pyriform Pharyngeal -- Pharyngeal- Mechanical Soft -- Pharyngeal -- Pharyngeal- Regular -- Pharyngeal -- Pharyngeal- Multi-consistency -- Pharyngeal -- Pharyngeal- Pill -- Pharyngeal -- Pharyngeal Comment --  No flowsheet data found. DeBlois, Katherene Ponto 08/26/2017, 11:53 AM               Anti-infectives: Anti-infectives (From admission, onward)   Start     Dose/Rate Route Frequency Ordered Stop   08/18/17 1200  anidulafungin (ERAXIS) 100 mg in sodium chloride 0.9 % 100 mL IVPB     100 mg 78 mL/hr over 100 Minutes Intravenous Every 24 hours 08/18/17 0907 08/20/17 1432   08/18/17 1000  piperacillin-tazobactam (ZOSYN) IVPB 3.375 g     3.375 g 12.5 mL/hr over 240 Minutes Intravenous Every 8 hours 08/18/17 0907 08/20/17 2250   08/13/17 1100  anidulafungin (ERAXIS) 100 mg in sodium chloride 0.9 % 100 mL IVPB  Status:  Discontinued     100 mg 78 mL/hr over 100 Minutes Intravenous Every 24 hours 08/12/17 1048 08/17/17 1308   08/12/17 1100  anidulafungin (ERAXIS) 200 mg in sodium chloride 0.9 % 200 mL IVPB     200 mg 78 mL/hr over 200 Minutes Intravenous  Once 08/12/17 1048 08/12/17 1648   08/11/17 0600  piperacillin-tazobactam (ZOSYN) IVPB 3.375 g     3.375 g 12.5 mL/hr over 240 Minutes Intravenous Every 8 hours 08/11/17 0025 08/17/17 2359   08/11/17 0115  vancomycin (VANCOCIN) IVPB 750 mg/150 ml premix     750 mg 150 mL/hr over 60 Minutes Intravenous Every 24 hours 08/11/17 0110 08/17/17 0211   08/11/17 0045  piperacillin-tazobactam (ZOSYN) IVPB 3.375 g     3.375 g 100 mL/hr over 30 Minutes Intravenous  Once 08/11/17 0033 08/11/17 0206   08/05/2017 2000  piperacillin-tazobactam (ZOSYN) IVPB 3.375 g  Status:   Discontinued     3.375 g 100 mL/hr over 30 Minutes Intravenous  Once 07/26/2017 1952 08/11/17 0025     Assessment/Plan s/pProcedure(s): EXPLORATORY LAPAROTOMY FOR FREE AIR, LEFT EXTENDED HEMICOLECTOMY, COLOSTOMY AND MUCUS FISTULA (N/A)  Perforated proximal descending colon Extended left hemicolectomy with distal transverse colostomy and mucous fistula creation, 08/11/17,Dr Kae Heller. POD#16 -TFs atgoal rate and tolerating well, SLP saw pt and wants cortak tube - think it will help with swallow eval and trial -BID midline dressing changesand prn for saturation from serous drainage -WOC RN for ostomy care, currently using small eakin pouch to bedside drainage. -WBCnormal, antibiotic course complete.  Sepsis-secondary to #1,patientoff pressors, abx therapydiscontinued7/26  Thrombocytopenia- resolved VDRF- extubated 6:30 PM 7/25 - one way extubation per family, stable  DM- SSI for now  HTN- resolved,BB on hold  AKI on CKD stage III-Creatinine1.41 from 1.56  Hypokalemia -3.2 this am, will replace  Severe malnutrition - tube feeds/Speech evaluation for swallowing  ABL anemia- H/H 9.3/29.7 s/p transfusion 2u pRBC 7/31  Paroxysmal A.Fib- cards following, diltiazem and beta blocker    Hx of R BKA secondary  to peripheral vascular disease- L toes dark, good doppler pulse, continue current care  Acute metabolic encephalopathy- multifactoral, followed simple commands this AM  Hypernatremia- sodium 152, serum osmolality 327; free water increased, continue to monitor  Goals of care - palliative following and greatly appreciate their support; PMT meetingtentativelyscheduled for 8/7 at 3:00 pm. Pt remains DNR, No PEG, No invasive procedures. Anticipate D/C to SNF for long term care with Hospice at some point.  FEN -place cortrak, restart TF once cortrak placed. TF @ goal, SLP following VTE - SCDs, Heparin previously held for low platelets/anemia,  restart subcu heparin 8/2 ID -Zosyn 7/16-->Vanc 7/16-->Eraxis 7/18 -->all abx stopped 08/17/17.(Calcitonin 32) Zosyn/Eraxis restarted 7/24>>7/26    LOS: 17 days    Leighton Ruff. Redmond Pulling, MD, FACS General, Bariatric, & Minimally Invasive Surgery [facility], [place]

## 2017-08-27 NOTE — Progress Notes (Signed)
[facility]                                                                              Patient Demographics  Judith [name], is a 77 y.o. female, DOB - Aug 07, 1940, YIR:[id number]  Admit date - 07/26/2017   Admitting Physician Md Edison Pace, MD  Outpatient Primary MD for the patient is System, Pcp Not In  Outpatient specialists:   LOS - 17  days   Medical records reviewed and are as summarized below:    Chief Complaint  Patient presents with  . Altered Mental Status       Brief summary   Judith [name] a 77 y.o.female with past medical history significant for hypertension, paroxysmal atrial fibrillation, left foot osteomyelitis, right below the knee amputation, who presented to [facility] ED on 08/23/2017 due to altered mental status. She was found to be in septic shock from perforated proximal descending colon and pneumoperitoneum. She underwent left hemicolectomy with mobilization of the splenic flexure, end colostomy and mucous fistula by surgery. Post op she required continual intubation and vasopressor use and was on PCCM service. Currently she is extubated and off pressors and medical service consulted for management of medical issues.  Hospital course complicated by persistent encephalopathy, severe protein calorie malnutrition, dysphagia and severe physical debility.  Assessment & Plan    Principal Problem:   Perforation of colon s/p left colectomy/ostomy/mucus fistula 08/11/2017 -Status post exploratory laparotomy for free air, left extended hemicolectomy, colostomy and mucous fistula, Dr. Kae Heller -Patient has completed a course of IV vancomycin and Zosyn, currently wound care RN following for ostomy care -On tube feeds, at goal, management per surgery  Active problems Acute metabolic encephalopathy -Multifactorial secondary to #1, acute illness, probable underlying dementia, severe malnutrition -Avoid sedating meds, better sleep hygiene, early ambulation -Improving  today, much more alert and awake  Acute blood loss anemia/iron deficiency -Likely due to #1, H&H currently stable 9.3, received 2 units packed RBCs on 7/31  Acute respiratory failure with hypoxia -Extubated on 7/25, O2 sats 100% on room air -Per goals of care DNR  Hypokalemia, acute kidney injury likely due to GI losses on CKD stage III -Replace potassium IV, creatinine trending up, -Placed on D5 water at 60 cc an hour  Hypernatremia -Sodium trending up with metabolic acidosis, 500, bicarb 15, creatinine trending up 1.5 -Continue free water, placed on D5 water at 60 cc an hour  Paroxysmal A. Fib -Not on any rate control agents -2D echo showed EF of 55 to 60% with grade 1 diastolic dysfunction  Moderate to severe protein calorie malnutrition, generalized debility -Palliative goals of care done on 7/31, DNR -Skilled nursing facility for long-term care with hospice recommended  Severe PVD status post Rt BKA -PT evaluation when able to  Code Status: DNR DVT Prophylaxis: Heparin subcu Family Communication: No family member at the bedside   Disposition Plan: Per primary service, CCS Time Spent in minutes 25 minutes  Procedures:  Exploratory laparotomy, hemicolectomy, colostomy  Consultants:   General surgery Palliative care  Antimicrobials:      Medications  Scheduled Meds: . chlorhexidine  15 mL Mouth Rinse BID  .  diltiazem  30 mg Oral Q12H  . ferrous sulfate  300 mg Per Tube BID WC  . free water  250 mL Per Tube QID  . heparin injection (subcutaneous)  5,000 Units Subcutaneous Q8H  . insulin aspart  0-15 Units Subcutaneous Q4H  . insulin glargine  5 Units Subcutaneous Daily  . mouth rinse  15 mL Mouth Rinse q12n4p  . metoprolol tartrate  12.5 mg Oral BID  . pantoprazole sodium  40 mg Per Tube Daily   Continuous Infusions: . sodium chloride 10 mL/hr at 08/25/17 2100  . feeding supplement (VITAL AF 1.2 CAL) 60 mL/hr at 08/27/17 0826   PRN  Meds:.acetaminophen (TYLENOL) oral liquid 160 mg/5 mL, albuterol, hydroxypropyl methylcellulose / hypromellose, midazolam, sodium chloride, sodium chloride flush   Antibiotics   Anti-infectives (From admission, onward)   Start     Dose/Rate Route Frequency Ordered Stop   08/18/17 1200  anidulafungin (ERAXIS) 100 mg in sodium chloride 0.9 % 100 mL IVPB     100 mg 78 mL/hr over 100 Minutes Intravenous Every 24 hours 08/18/17 0907 08/20/17 1432   08/18/17 1000  piperacillin-tazobactam (ZOSYN) IVPB 3.375 g     3.375 g 12.5 mL/hr over 240 Minutes Intravenous Every 8 hours 08/18/17 0907 08/20/17 2250   08/13/17 1100  anidulafungin (ERAXIS) 100 mg in sodium chloride 0.9 % 100 mL IVPB  Status:  Discontinued     100 mg 78 mL/hr over 100 Minutes Intravenous Every 24 hours 08/12/17 1048 08/17/17 1308   08/12/17 1100  anidulafungin (ERAXIS) 200 mg in sodium chloride 0.9 % 200 mL IVPB     200 mg 78 mL/hr over 200 Minutes Intravenous  Once 08/12/17 1048 08/12/17 1648   08/11/17 0600  piperacillin-tazobactam (ZOSYN) IVPB 3.375 g     3.375 g 12.5 mL/hr over 240 Minutes Intravenous Every 8 hours 08/11/17 0025 08/17/17 2359   08/11/17 0115  vancomycin (VANCOCIN) IVPB 750 mg/150 ml premix     750 mg 150 mL/hr over 60 Minutes Intravenous Every 24 hours 08/11/17 0110 08/17/17 0211   08/11/17 0045  piperacillin-tazobactam (ZOSYN) IVPB 3.375 g     3.375 g 100 mL/hr over 30 Minutes Intravenous  Once 08/11/17 0033 08/11/17 0206   07/30/2017 2000  piperacillin-tazobactam (ZOSYN) IVPB 3.375 g  Status:  Discontinued     3.375 g 100 mL/hr over 30 Minutes Intravenous  Once 08/12/2017 1952 08/11/17 0025        Subjective:   Samreet Edenfield was seen and examined today.  Somewhat more alert and awake today, denies any specific complaints.  No fevers or chills.  Objective:   Vitals:   08/27/17 0008 08/27/17 0425 08/27/17 0756 08/27/17 1257  BP: (!) 113/47 (!) 115/42  107/72  Pulse: 94 98  85  Resp: (!) 28 (!) 24   18  Temp: 98.4 F (36.9 C) 99.8 F (37.7 C)  98.5 F (36.9 C)  TempSrc: Oral Oral  Oral  SpO2: 100% 100%  100%  Weight:   86.1 kg (189 lb 13.1 oz)   Height:        Intake/Output Summary (Last 24 hours) at 08/27/2017 1321 Last data filed at 08/27/2017 0804 Gross per 24 hour  Intake 10 ml  Output 2050 ml  Net -2040 ml     Wt Readings from Last 3 Encounters:  08/27/17 86.1 kg (189 lb 13.1 oz)  04/26/17 78.6 kg (173 lb 4.5 oz)  03/18/17 75.3 kg (166 lb)     Exam   General:  Much more alert and awake today, NAD, gives 1 word answers  Eyes:   HEENT:    Cardiovascular: S1 S2 auscultated, Regular rate and rhythm. No pedal edema b/l  Respiratory: Clear to auscultation bilaterally, no wheezing, rales or rhonchi  Gastrointestinal: Soft, NT, dressing intact, stool in the pouch  Ext: right AKA, left lower extremity in heel protectors  Neuro: conversing today  Musculoskeletal: No digital cyanosis, clubbing  Skin: No rashes  Psych: much more alert and awake   Data Reviewed:  I have personally reviewed following labs and imaging studies  Micro Results No results found for this or any previous visit (from the past 240 hour(s)).  Radiology Reports Ct Abdomen Pelvis Wo Contrast  Result Date: 08/20/2017 CLINICAL DATA:  Abdominal distension.  Follow-up pneumoperitoneum. EXAM: CT ABDOMEN AND PELVIS WITHOUT CONTRAST TECHNIQUE: Multidetector CT imaging of the abdomen and pelvis was performed following the standard protocol without IV contrast. COMPARISON:  Abdominal radiograph August 10, 2016 and CT abdomen and pelvis August 01, 2015. FINDINGS: LOWER CHEST: Bilateral lower lobe consolidation. Heart size is normal. Moderate coronary artery calcifications. No pericardial effusion. HEPATOBILIARY: Normal. PANCREAS: Normal. SPLEEN: Normal. ADRENALS/URINARY TRACT: Kidneys are orthotopic, demonstrating normal size and morphology. No nephrolithiasis, hydronephrosis; limited assessment for renal  masses by nonenhanced CT. 2.5 cm dense (70 Hounsfield units) RIGHT lower pole proteinaceous/hemorrhagic cyst, no routine indicated follow-up by consensus. 9 mm dense RIGHT interpolar cyst. Isodense probable mass lower pole LEFT kidney. All renal masses appear relatively stable from prior contrast enhanced CT. The unopacified ureters are normal in course and caliber. Urinary bladder is partially distended and unremarkable. Normal adrenal glands. STOMACH/BOWEL: Moderate hiatal hernia. Large amount of stool in sigmoid colon through the level of the anus, rectum distended to 9.1 cm. At proximal sigmoid colon is a linear intramural lucency concerning for perforation (coronal image 32), associated circumferential focal mural thickening. Adjacent feculent appearing material in adjacent peritoneum. Moderate colonic diverticulosis. Debris distended stomach. Normal appendix. VASCULAR/LYMPHATIC: Aortoiliac vessels are normal in course and caliber. Moderate to severe calcific atherosclerosis. No lymphadenopathy by CT size criteria. REPRODUCTIVE: Normal. OTHER: Small volume ascites LEFT upper quadrant. Large volume pneumoperitoneum. MUSCULOSKELETAL: Non-acute. IMPRESSION: 1. Large volume pneumoperitoneum. Probable sigmoid bowel perforation with adjacent probable stool in peritoneal cavity. Ulcerated tumor not excluded. 2. Large volume rectosigmoid stool, rectum distended to 9.1 cm. 3. Bilateral lower lobe consolidation concerning for pneumonia. 4. Critical Value/emergent results were called by telephone at the time of interpretation on 07/26/2017 at 8:50 pm to Dr. Dorie Rank , who verbally acknowledged these results. Electronically Signed   By: Elon Alas M.D.   On: 08/21/2017 20:54   Ct Head Wo Contrast  Result Date: 08/02/2017 CLINICAL DATA:  Altered mental status from baseline. EXAM: CT HEAD WITHOUT CONTRAST TECHNIQUE: Contiguous axial images were obtained from the base of the skull through the vertex without  intravenous contrast. COMPARISON:  04/23/2017 FINDINGS: Brain: Subtle hypodensity involving the left pons and midbrain may represent nonhemorrhagic infarcts, apparently new since prior comparison on 04/23/2017. Atrophy with chronic stable small vessel ischemic disease of periventricular white matter. No acute intracranial hemorrhage, midline shift or edema. No large vascular territory infarct. No intra-axial mass nor extra-axial fluid collections. Midline fourth ventricle and basal cisterns without effacement. Vascular: Hyperdense vessel sign. Skull: No acute skull fracture Sinuses/Orbits: Bilateral lens replacements. Clear paranasal sinuses. Other: Clear mastoids IMPRESSION: 1. Chronic appearing small vessel ischemic disease of periventricular white matter with atrophy. 2. Age indeterminate but possibly new since prior exam are  subtle hypodensities in the left pons and midbrain suspicious for small nonhemorrhagic infarcts. Electronically Signed   By: Ashley Royalty M.D.   On: 07/29/2017 20:45   Dg Chest Port 1 View  Result Date: 08/23/2017 CLINICAL DATA:  Nasogastric tube, postop EXAM: PORTABLE CHEST 1 VIEW COMPARISON:  Portable exam 1952 hours compared to 08/16/2017 FINDINGS: Nasogastric tube extends into stomach, with proximal side-port at approximately the gastroesophageal junction region. RIGHT jugular central venous catheter tip projects over RIGHT atrium unchanged, though patient is mildly kyphotic in positioning. Upper normal size of cardiac silhouette. Atherosclerotic calcification aorta. Slight pulmonary vascular congestion. Bibasilar atelectasis. Upper lungs clear. No pleural effusion or pneumothorax. IMPRESSION: Tip of nasogastric tube projects over stomach. Bibasilar atelectasis. Electronically Signed   By: Lavonia Dana M.D.   On: 08/23/2017 20:07   Dg Chest Port 1 View  Result Date: 08/16/2017 CLINICAL DATA:  Acute respiratory failure EXAM: PORTABLE CHEST 1 VIEW COMPARISON:  08/15/2017 FINDINGS:  Endotracheal tube, nasogastric catheter and right jugular central line are again identified and stable in appearance. Lungs are well aerated bilaterally. Some improved aeration in the left base is noted when compare with the previous day. No new focal infiltrate or bony abnormality is noted. IMPRESSION: Improving aeration in the left base. Electronically Signed   By: Inez Catalina M.D.   On: 08/16/2017 06:58   Dg Chest Port 1 View  Result Date: 08/15/2017 CLINICAL DATA:  Acute respiratory failure with hypoxia EXAM: PORTABLE CHEST 1 VIEW COMPARISON:  08/14/2017 FINDINGS: Endotracheal tube 3 cm from carina. NG tube in stomach. Low lung volumes. There is LEFT lower lobe effusion with opacity representing atelectasis or infiltrate. Mild increase in the LEFT upper lobe density. Upper lungs are clear. No pneumothorax IMPRESSION: 1. Stable support apparatus. 2. Increased atelectasis versus infiltrate in the LEFT lower lobe with small effusion. Electronically Signed   By: Suzy Bouchard M.D.   On: 08/15/2017 10:28   Dg Chest Port 1 View  Result Date: 08/14/2017 CLINICAL DATA:  Acute respiratory failure with hypoxia. EXAM: PORTABLE CHEST 1 VIEW COMPARISON:  08/13/2017 FINDINGS: Endotracheal tube has tip 3.3 cm above the carina. Nasogastric tube has side-port over the stomach in the left upper quadrant as tip is not visualized. Right IJ central venous catheter is unchanged with tip just above the cavoatrial junction. Lungs are adequately inflated with subtle left retrocardiac opacification likely atelectasis. Lungs are otherwise clear. Mild stable cardiomegaly. Remainder of the exam is unchanged. IMPRESSION: Mild left retrocardiac opacification likely atelectasis unchanged. Mild stable cardiomegaly. Tubes and lines as described. Electronically Signed   By: Marin Olp M.D.   On: 08/14/2017 09:08   Dg Chest Port 1 View  Result Date: 08/13/2017 CLINICAL DATA:  Acute respiratory failure. EXAM: PORTABLE CHEST 1  VIEW COMPARISON:  08/12/2017. FINDINGS: Endotracheal tube tip noted 3 cm above the carina. NG tube tip noted below the left hemidiaphragm. Right IJ line tip noted over the right atrium. Heart size normal. Low lung volumes mild bibasilar atelectasis/infiltrates. Small right pleural effusion cannot be excluded. No left pleural effusion noted on today's exam. No pneumothorax. IMPRESSION: 1. Endotracheal tube tip noted 3 cm above the carina. NG tube and right IJ line stable position. 2. Low lung volumes. Bibasilar atelectasis/infiltrates again noted. Small right pleural effusion cannot be excluded. No left pleural effusion noted on today's exam. No pneumothorax. Electronically Signed   By: Marcello Moores  Register   On: 08/13/2017 10:28   Dg Chest Port 1 View  Result Date: 08/12/2017 CLINICAL  DATA:  Acute respiratory failure EXAM: PORTABLE CHEST 1 VIEW COMPARISON:  08/11/2017 at 1:35 a.m. FINDINGS: Endotracheal tube tip has been retracted and is just above the level of the clavicular heads. Right internal jugular vein approach central venous catheter tip is in the low right atrium. Small left pleural effusion with associated atelectasis. IMPRESSION: 1. Endotracheal tube tip just above the level of the clavicular heads. 2. Low right atrium positioning of central venous catheter. Correlate with EKG. 3. Small left pleural effusion and associated atelectasis. Electronically Signed   By: Ulyses Jarred M.D.   On: 08/12/2017 13:51   Dg Chest Port 1 View  Result Date: 08/11/2017 CLINICAL DATA:  77 y/o F; post intubation and central line placement. EXAM: PORTABLE CHEST 1 VIEW COMPARISON:  08/11/2017 chest radiograph FINDINGS: Stable cardiomegaly given projection and technique. No appreciable pneumoperitoneum. Stable bibasilar opacities and small effusions. Endotracheal tube tip projects 3.7 cm above the carina. Right central venous catheter tip projects over right cavoatrial junction. Enteric tube tip extends below field of view  into the abdomen. Bones are unremarkable. IMPRESSION: 1. Stable bibasilar atelectasis and small effusions. 2. Endotracheal tube tip 3.7 cm above the carina. 3. Enteric tube tip extends below field of view into abdomen. 4. Right central venous catheter tip projects over cavoatrial junction. Electronically Signed   By: Kristine Garbe M.D.   On: 08/11/2017 02:05   Dg Chest Port 1 View  Result Date: 08/19/2017 CLINICAL DATA:  Worsening altered mental status. History of dementia. EXAM: PORTABLE CHEST 1 VIEW COMPARISON:  Chest radiograph April 23, 2017 FINDINGS: Air underneath bilateral hemidiaphragms. Bibasilar atelectasis. Stable cardiomegaly. Calcified aortic arch. No pneumothorax. Osteopenia. IMPRESSION: 1. Pneumoperitoneum. 2. Stable cardiomegaly.  Bibasilar atelectasis/scarring. 3.  Aortic Atherosclerosis (ICD10-I70.0). 4. Critical Value/emergent results were called by telephone at the time of interpretation on 08/04/2017 at 7:23 pm to Dr. Dorie Rank , who verbally acknowledged these results. Electronically Signed   By: Elon Alas M.D.   On: 08/15/2017 19:24   Dg Swallowing Func-speech Pathology  Result Date: 08/26/2017 Objective Swallowing Evaluation: Type of Study: MBS-Modified Barium Swallow Study  Patient Details Name: ALLAYAH RAINERI MRN: [id number] Date of Birth: 1940-08-03 Today's Date: 08/26/2017 Time: SLP Start Time (ACUTE ONLY): 1015 -SLP Stop Time (ACUTE ONLY): 1030 SLP Time Calculation (min) (ACUTE ONLY): 15 min Past Medical History: Past Medical History: Diagnosis Date . Anxiety  . Arthritis   "left leg" (05/17/2013) . Asthma  . Charcot's joint of foot due to diabetes ([place])  . Closed right ankle fracture  . Colitis 09/06/02 . Diabetes mellitus without complication ([place])  . Fracture of femur, distal, right, closed ([place]) 03/01/2012 . GERD (gastroesophageal reflux disease)  . VFIEPPIR(518.8)   "probably weekly" (05/17/2013) . Hemorrhoid  . High cholesterol  . History of stomach ulcers  .  Hyperlipidemia  . Hypertension  . Insulin dependent diabetes mellitus ([place]) 03/01/2012 . Obesity (BMI 30-39.9) 03/03/2012 . Perforated viscus 08/25/2017 . Pneumonia   "once" (05/17/2013) . Proctocolitis 09/04/02 Past Surgical History: Past Surgical History: Procedure Laterality Date . ABDOMINAL HYSTERECTOMY   . BELOW KNEE LEG AMPUTATION Right 02/2008 . BREAST LUMPECTOMY Bilateral   "not cancer" . CATARACT EXTRACTION W/ INTRAOCULAR LENS  IMPLANT, BILATERAL Bilateral  . LAPAROTOMY N/A 08/11/2017  Procedure: EXPLORATORY LAPAROTOMY FOR FREE AIR, LEFT EXTENDED HEMICOLECTOMY, COLOSTOMY AND MUCUS FISTULA;  Surgeon: Clovis Riley, MD;  Location: [facility];  Service: General;  Laterality: N/A; . ORIF FEMUR FRACTURE  03/01/2012  Procedure: OPEN REDUCTION INTERNAL FIXATION (ORIF) DISTAL  FEMUR FRACTURE;  Surgeon: Rozanna Box, MD;  Location: [facility];  Service: Orthopedics;  Laterality: Right;  ORIF right femur HPI: 77 y.o. female  with past medical history of IDDM, charcot's joint, dementia, anxiety, proctocolitis, PUD, right BKA, asthma, and arthritis who was admitted on 08/16/2017 with septic shock.  She was found to have a bowel perforation and was taken to the OR.  She received a hemicolectomy with colostomy and mucous fistula. Intubated 08/11/17 with one-way extubation on 08/19/17. Per palliative, prognosis <2 weeks in setting of hypotensive with Afib, severely deconditioned, malnourished, demented, with a large abdominal wound. No tracheostomy, no PEG.  Subjective: weakly states name Assessment / Plan / Recommendation CHL IP CLINICAL IMPRESSIONS 08/26/2017 Clinical Impression Difficult study, pt lethargic but opened eyes and responded to PO presentation. Initially pt became drowsy while orally holding PO, requiring deep suction without gag. After this however, pt woke up enough to more actively orally manipulate a bite of puree. Oral phase still prolonged with extended lingual pumping and inefficient oral transit followed by delayed,  weak ineffective swallow response with sever vallecular residue (mild residue below CP noted). Pharyngeal mobility certainly impacted by large bore NG, however pt too lethargic to warrant removal today as she will need ongoing alternate method of nutrition over the next 24 hours. Suggested removal of NG tomorrow with replacement of a Cortrak which would allow greater progress with diagnostic PO trials. WIll follow for readiness for PO vs further objective testing.  SLP Visit Diagnosis Dysphagia, unspecified (R13.10) Attention and concentration deficit following -- Frontal lobe and executive function deficit following -- Impact on safety and function Severe aspiration risk;Risk for inadequate nutrition/hydration   CHL IP TREATMENT RECOMMENDATION 08/26/2017 Treatment Recommendations Therapy as outlined in treatment plan below   Prognosis 08/26/2017 Prognosis for Safe Diet Advancement Guarded Barriers to Reach Goals Other (Comment) Barriers/Prognosis Comment -- CHL IP DIET RECOMMENDATION 08/26/2017 SLP Diet Recommendations NPO;Alternative means - temporary Liquid Administration via -- Medication Administration Via alternative means Compensations -- Postural Changes --   No flowsheet data found.  CHL IP FOLLOW UP RECOMMENDATIONS 08/26/2017 Follow up Recommendations Skilled Nursing facility   [facility] IP FREQUENCY AND DURATION 08/26/2017 Speech Therapy Frequency (ACUTE ONLY) min 2x/week Treatment Duration 2 weeks      CHL IP ORAL PHASE 08/26/2017 Oral Phase Impaired Oral - Pudding Teaspoon -- Oral - Pudding Cup -- Oral - Honey Teaspoon -- Oral - Honey Cup -- Oral - Nectar Teaspoon Lingual/palatal residue;Piecemeal swallowing;Decreased bolus cohesion;Delayed oral transit;Lingual pumping;Incomplete tongue to palate contact;Right anterior bolus loss;Left anterior bolus loss Oral - Nectar Cup -- Oral - Nectar Straw -- Oral - Thin Teaspoon -- Oral - Thin Cup -- Oral - Thin Straw -- Oral - Puree Lingual/palatal residue;Piecemeal  swallowing;Decreased bolus cohesion;Delayed oral transit;Lingual pumping;Incomplete tongue to palate contact Oral - Mech Soft -- Oral - Regular -- Oral - Multi-Consistency -- Oral - Pill -- Oral Phase - Comment --  CHL IP PHARYNGEAL PHASE 08/26/2017 Pharyngeal Phase Impaired Pharyngeal- Pudding Teaspoon -- Pharyngeal -- Pharyngeal- Pudding Cup -- Pharyngeal -- Pharyngeal- Honey Teaspoon -- Pharyngeal -- Pharyngeal- Honey Cup -- Pharyngeal -- Pharyngeal- Nectar Teaspoon Delayed swallow initiation-vallecula;Reduced epiglottic inversion;Reduced pharyngeal peristalsis;Reduced anterior laryngeal mobility;Reduced laryngeal elevation;Reduced tongue base retraction;Pharyngeal residue - valleculae;Pharyngeal residue - pyriform Pharyngeal -- Pharyngeal- Nectar Cup -- Pharyngeal -- Pharyngeal- Nectar Straw -- Pharyngeal -- Pharyngeal- Thin Teaspoon -- Pharyngeal -- Pharyngeal- Thin Cup -- Pharyngeal -- Pharyngeal- Thin Straw -- Pharyngeal -- Pharyngeal- Puree Delayed swallow initiation-vallecula;Reduced epiglottic inversion;Reduced pharyngeal  peristalsis;Reduced anterior laryngeal mobility;Reduced laryngeal elevation;Reduced tongue base retraction;Pharyngeal residue - valleculae;Pharyngeal residue - pyriform Pharyngeal -- Pharyngeal- Mechanical Soft -- Pharyngeal -- Pharyngeal- Regular -- Pharyngeal -- Pharyngeal- Multi-consistency -- Pharyngeal -- Pharyngeal- Pill -- Pharyngeal -- Pharyngeal Comment --  No flowsheet data found. DeBlois, Katherene Ponto 08/26/2017, 11:53 AM               Lab Data:  CBC: Recent Labs  Lab 08/23/17 0435 08/24/17 0400 08/24/17 0408 08/25/17 0503 08/25/17 1245 08/26/17 0441  WBC 8.1  --  8.1 7.5 7.7 9.0  HGB 8.3*  --  8.6* 7.6* 9.1* 9.3*  HCT 26.6* 26.7* 28.0* 24.9* 28.7* 29.7*  MCV 85.5  --  86.4 87.1 85.9 87.6  PLT 218  --  281 313 301 595   Basic Metabolic Panel: Recent Labs  Lab 08/21/17 0453  08/23/17 0435  08/24/17 0408 08/24/17 1643 08/25/17 0503 08/26/17 0441  08/27/17 0800  NA 144   < > 151*   < > 153* 151* 150* 152* 155*  K 3.3*   < > 3.3*   < > 3.5 3.2* 2.9* 3.9 3.2*  CL 115*   < > 123*   < > 127* 125* 126* 130* >130*  CO2 18*   < > 19*   < > 19* 17* 18* 15* 15*  GLUCOSE 223*   < > 198*   < > 225* 284* 216* 233* 202*  BUN 49*   < > 43*   < > 42* 42* 40* 37* 36*  CREATININE 1.60*   < > 1.54*   < > 1.56* 1.52* 1.49* 1.41* 1.53*  CALCIUM 7.3*   < > 7.7*   < > 7.9* 7.7* 7.7* 7.9* 7.9*  MG 1.8  --  2.0  --  1.8  --  2.2  --   --   PHOS  --   --   --   --  3.5  --  3.3  --   --    < > = values in this interval not displayed.   GFR: Estimated Creatinine Clearance: 29.5 mL/min (A) (by C-G formula based on SCr of 1.53 mg/dL (H)). Liver Function Tests: Recent Labs  Lab 08/24/17 0408 08/25/17 0503 08/26/17 0441  AST 21 16 19   ALT 18 14 15   ALKPHOS 118 95 99  BILITOT 0.8 0.6 0.8  PROT 4.7* 4.3* 4.7*  ALBUMIN 1.2* 1.1* 1.3*   No results for input(s): LIPASE, AMYLASE in the last 168 hours. No results for input(s): AMMONIA in the last 168 hours. Coagulation Profile: No results for input(s): INR, PROTIME in the last 168 hours. Cardiac Enzymes: No results for input(s): CKTOTAL, CKMB, CKMBINDEX, TROPONINI in the last 168 hours. BNP (last 3 results) No results for input(s): PROBNP in the last 8760 hours. HbA1C: No results for input(s): HGBA1C in the last 72 hours. CBG: Recent Labs  Lab 08/26/17 2033 08/27/17 0003 08/27/17 0423 08/27/17 0832 08/27/17 1224  GLUCAP 202* 260* 222* 202* 163*   Lipid Profile: No results for input(s): CHOL, HDL, LDLCALC, TRIG, CHOLHDL, LDLDIRECT in the last 72 hours. Thyroid Function Tests: No results for input(s): TSH, T4TOTAL, FREET4, T3FREE, THYROIDAB in the last 72 hours. Anemia Panel: No results for input(s): VITAMINB12, FOLATE, FERRITIN, TIBC, IRON, RETICCTPCT in the last 72 hours. Urine analysis:    Component Value Date/Time   COLORURINE AMBER (A) 08/11/2017 0521   APPEARANCEUR CLOUDY (A)  08/11/2017 0521   LABSPEC 1.019 08/11/2017 0521   PHURINE 5.0 08/11/2017  [place] 08/11/2017 0521   HGBUR SMALL (A) 08/11/2017 0521   BILIRUBINUR SMALL (A) 08/11/2017 0521   KETONESUR NEGATIVE 08/11/2017 0521   PROTEINUR 30 (A) 08/11/2017 0521   UROBILINOGEN 0.2 10/17/2014 0849   NITRITE NEGATIVE 08/11/2017 0521   LEUKOCYTESUR LARGE (A) 08/11/2017 0521     Ziana Heyliger M.D. [facility] 08/27/2017, 1:21 PM  Pager: [phone] Between 7am to 7pm - call Pager - [phone]  After 7pm go to [email or web address] - password [email or web address]  Call night coverage person covering after 7pm

## 2017-08-28 LAB — GLUCOSE, CAPILLARY
GLUCOSE-CAPILLARY: 237 mg/dL — AB (ref 70–99)
GLUCOSE-CAPILLARY: 275 mg/dL — AB (ref 70–99)
Glucose-Capillary: 210 mg/dL — ABNORMAL HIGH (ref 70–99)
Glucose-Capillary: 252 mg/dL — ABNORMAL HIGH (ref 70–99)
Glucose-Capillary: 205 mg/dL — ABNORMAL HIGH (ref 70–99)

## 2017-08-28 LAB — CBC
HCT: 27.3 % — ABNORMAL LOW (ref 36.0–46.0)
Hemoglobin: 8.3 g/dL — ABNORMAL LOW (ref 12.0–15.0)
MCH: 27.2 pg (ref 26.0–34.0)
MCHC: 30.4 g/dL (ref 30.0–36.0)
MCV: 89.5 fL (ref 78.0–100.0)
PLATELETS: 339 10*3/uL (ref 150–400)
RBC: 3.05 MIL/uL — ABNORMAL LOW (ref 3.87–5.11)
RDW: 20.4 % — AB (ref 11.5–15.5)
WBC: 7.9 10*3/uL (ref 4.0–10.5)

## 2017-08-28 LAB — BASIC METABOLIC PANEL
Anion gap: 5 (ref 5–15)
BUN: 36 mg/dL — ABNORMAL HIGH (ref 8–23)
CALCIUM: 7.9 mg/dL — AB (ref 8.9–10.3)
CO2: 16 mmol/L — ABNORMAL LOW (ref 22–32)
Chloride: 130 mmol/L — ABNORMAL HIGH (ref 98–111)
Creatinine, Ser: 1.5 mg/dL — ABNORMAL HIGH (ref 0.44–1.00)
GFR, EST AFRICAN AMERICAN: 37 mL/min — AB (ref >60)
GFR, EST NON AFRICAN AMERICAN: 32 mL/min — AB (ref >60)
Glucose, Bld: 251 mg/dL — ABNORMAL HIGH (ref 70–99)
Potassium: 3.1 mmol/L — ABNORMAL LOW (ref 3.5–5.1)
SODIUM: 151 mmol/L — AB (ref 135–145)

## 2017-08-28 LAB — MAGNESIUM: Magnesium: 1.8 mg/dL (ref 1.7–2.4)

## 2017-08-28 MED ORDER — MAGNESIUM SULFATE 4 GM/100ML IV SOLN
4.0000 g | Freq: Once | INTRAVENOUS | Status: AC
Start: 2017-08-28 — End: 2017-08-28
  Administered 2017-08-28: 4 g via INTRAVENOUS
  Filled 2017-08-28: qty 100

## 2017-08-28 MED ORDER — POTASSIUM CHLORIDE 20 MEQ/15ML (10%) PO SOLN
40.0000 meq | Freq: Every day | ORAL | Status: DC
  Administered 2017-08-29 – 2017-09-01 (×4): 40 meq via ORAL
  Filled 2017-08-28 (×4): qty 30

## 2017-08-28 MED ORDER — INSULIN ASPART 100 UNIT/ML ~~LOC~~ SOLN
0.0000 [IU] | Freq: Four times a day (QID) | SUBCUTANEOUS | Status: DC
  Administered 2017-08-28: 5 [IU] via SUBCUTANEOUS
  Administered 2017-08-29: 8 [IU] via SUBCUTANEOUS
  Administered 2017-08-29: 5 [IU] via SUBCUTANEOUS
  Administered 2017-08-29: 8 [IU] via SUBCUTANEOUS
  Administered 2017-08-29 – 2017-08-30 (×2): 5 [IU] via SUBCUTANEOUS
  Administered 2017-08-30 – 2017-08-31 (×4): 8 [IU] via SUBCUTANEOUS
  Administered 2017-08-31: 11 [IU] via SUBCUTANEOUS
  Administered 2017-08-31: 5 [IU] via SUBCUTANEOUS
  Administered 2017-08-31: 11 [IU] via SUBCUTANEOUS
  Administered 2017-09-01: 5 [IU] via SUBCUTANEOUS
  Administered 2017-09-01: 3 [IU] via SUBCUTANEOUS

## 2017-08-28 MED ORDER — POTASSIUM CHLORIDE 10 MEQ/100ML IV SOLN
10.0000 meq | INTRAVENOUS | Status: AC
  Administered 2017-08-28 (×2): 10 meq via INTRAVENOUS
  Filled 2017-08-28 (×2): qty 100

## 2017-08-28 MED ORDER — POTASSIUM CHLORIDE 10 MEQ/100ML IV SOLN
10.0000 meq | INTRAVENOUS | Status: AC
  Administered 2017-08-28 (×4): 10 meq via INTRAVENOUS
  Filled 2017-08-28 (×3): qty 100

## 2017-08-28 MED ORDER — INSULIN GLARGINE 100 UNIT/ML ~~LOC~~ SOLN
10.0000 [IU] | Freq: Every day | SUBCUTANEOUS | Status: DC
  Administered 2017-08-28 – 2017-08-30 (×3): 10 [IU] via SUBCUTANEOUS
  Filled 2017-08-28 (×3): qty 0.1

## 2017-08-28 NOTE — Progress Notes (Signed)
[facility] Progress Note  18 Days Post-Op  Subjective: CC: no major events.  TF via corpak  Objective: Vital signs in last 24 hours: Temp:  [98.5 F (36.9 C)-99.6 F (37.6 C)] 99.6 F (37.6 C) (08/03 0513) Pulse Rate:  [85-97] 97 (08/03 0513) Resp:  [18] 18 (08/03 0513) BP: (107-124)/(44-72) 124/44 (08/03 0513) SpO2:  [98 %-100 %] 98 % (08/03 0513) Last BM Date: 08/27/17  Intake/Output from previous day: 08/02 0701 - 08/03 0700 In: 5025 [I.V.:1309; TK/WI:0973] Out: 5329 [Urine:2000; Stool:2225] Intake/Output this shift: No intake/output data recorded.  PE: Gen:  awake, eyes open, no follow commands no response except stares/follows, in NAD Card:  Regular rate and rhythm Pulm:  Normal effort, clear to auscultation bilaterally Abd: Soft, non-tender, non-distended, midline wound with dressing clean and dry, ostomy and mucous fistula viable with stool in pouch.       Skin: warm and dry, no rashes  Psych: unable to assess Neuro: not opening eyes to sternal rub or pain. Non-verbal this AM.  Lab Results:  Recent Labs    08/26/17 0441 08/28/17 0356  WBC 9.0 7.9  HGB 9.3* 8.3*  HCT 29.7* 27.3*  PLT 336 339   BMET Recent Labs    08/27/17 0800 08/28/17 0356  NA 155* 151*  K 3.2* 3.1*  CL >130* 130*  CO2 15* 16*  GLUCOSE 202* 251*  BUN 36* 36*  CREATININE 1.53* 1.50*  CALCIUM 7.9* 7.9*   PT/INR No results for input(s): LABPROT, INR in the last 72 hours. CMP     Component Value Date/Time   NA 151 (H) 08/28/2017 0356   K 3.1 (L) 08/28/2017 0356   CL 130 (H) 08/28/2017 0356   CO2 16 (L) 08/28/2017 0356   GLUCOSE 251 (H) 08/28/2017 0356   BUN 36 (H) 08/28/2017 0356   CREATININE 1.50 (H) 08/28/2017 0356   CALCIUM 7.9 (L) 08/28/2017 0356   PROT 4.7 (L) 08/26/2017 0441   ALBUMIN 1.3 (L) 08/26/2017 0441   AST 19 08/26/2017 0441   ALT 15 08/26/2017 0441   ALKPHOS 99 08/26/2017 0441   BILITOT 0.8 08/26/2017 0441   GFRNONAA 32 (L) 08/28/2017 0356    GFRAA 37 (L) 08/28/2017 0356   Lipase     Component Value Date/Time   LIPASE 70 (H) 07/31/2017 1954       Studies/Results: No results found.  Anti-infectives: Anti-infectives (From admission, onward)   Start     Dose/Rate Route Frequency Ordered Stop   08/18/17 1200  anidulafungin (ERAXIS) 100 mg in sodium chloride 0.9 % 100 mL IVPB     100 mg 78 mL/hr over 100 Minutes Intravenous Every 24 hours 08/18/17 0907 08/20/17 1432   08/18/17 1000  piperacillin-tazobactam (ZOSYN) IVPB 3.375 g     3.375 g 12.5 mL/hr over 240 Minutes Intravenous Every 8 hours 08/18/17 0907 08/20/17 2250   08/13/17 1100  anidulafungin (ERAXIS) 100 mg in sodium chloride 0.9 % 100 mL IVPB  Status:  Discontinued     100 mg 78 mL/hr over 100 Minutes Intravenous Every 24 hours 08/12/17 1048 08/17/17 1308   08/12/17 1100  anidulafungin (ERAXIS) 200 mg in sodium chloride 0.9 % 200 mL IVPB     200 mg 78 mL/hr over 200 Minutes Intravenous  Once 08/12/17 1048 08/12/17 1648   08/11/17 0600  piperacillin-tazobactam (ZOSYN) IVPB 3.375 g     3.375 g 12.5 mL/hr over 240 Minutes Intravenous Every 8 hours 08/11/17 0025 08/17/17 2359   08/11/17 0115  vancomycin (VANCOCIN) IVPB 750 mg/150 ml premix     750 mg 150 mL/hr over 60 Minutes Intravenous Every 24 hours 08/11/17 0110 08/17/17 0211   08/11/17 0045  piperacillin-tazobactam (ZOSYN) IVPB 3.375 g     3.375 g 100 mL/hr over 30 Minutes Intravenous  Once 08/11/17 0033 08/11/17 0206   08/09/2017 2000  piperacillin-tazobactam (ZOSYN) IVPB 3.375 g  Status:  Discontinued     3.375 g 100 mL/hr over 30 Minutes Intravenous  Once 08/03/2017 1952 08/11/17 0025     Assessment/Plan s/pProcedure(s): EXPLORATORY LAPAROTOMY FOR FREE AIR, LEFT EXTENDED HEMICOLECTOMY, COLOSTOMY AND MUCUS FISTULA (N/A)  Perforated proximal descending colon Extended left hemicolectomy with distal transverse colostomy and mucous fistula creation, 08/11/17,Dr Kae Heller. POD#17 -TFs atgoal rate and  tolerating well with corpak tube - think it will help with swallow eval and trial -BID midline dressing changesand prn for saturation from serous drainage -WOC RN for ostomy care, currently using small eakin pouch to bedside drainage. -WBCnormal, antibiotic course complete.  Sepsis-secondary to #1,patientoff pressors, abx therapydiscontinued7/26  Thrombocytopenia- resolved  VDRF- extubated 6:30 PM 7/25 - last ETTper family, stable  DM- SSI for now  HTN- resolved,BB on hold  AKI on CKD stage III-Creatinine1.41 from 1.56  Hypokalemia -3.1 this am, will replace again.    HypoMAg - replacing  Severe malnutrition - tube feeds/Speech evaluation for swallowing  ABL anemia- H/H 9.3/29.7 s/p transfusion 2u pRBC 7/31  Paroxysmal A.Fib- cards following, diltiazem and beta blocker    Hx of R BKA secondary to peripheral vascular disease- L toes dark, good doppler pulse, continue current care  Acute metabolic encephalopathy- multifactoral, followed simple commands this AM.  Most telling part of her prognosis  Hypernatremia- continue to monitor  Goals of care - palliative following and greatly appreciate their support; PMT meetingtentativelyscheduled for 8/7 at 3:00 pm. Pt remains DNR, No PEG, No invasive procedures. Anticipate D/C to SNF for long term care with Hospice at some point.  FEN -place cortrak, restart TF once cortrak placed. TF @ goal, SLP following  VTE - SCDs, Heparin previously held for low platelets/anemia, restart subcu heparin 8/2  ID -Zosyn 7/16-->Vanc 7/16-->Eraxis 7/18 -->all abx stopped 08/17/17.(Calcitonin 32) Zosyn/Eraxis restarted 7/24>>7/26  Consider wound vac    LOS: 18 days   Adin Hector, MD, FACS, MASCRS Gastrointestinal and Minimally Invasive Surgery    [address], [place], [place] [address] [phone] Main / Paging [phone] Fax

## 2017-08-28 NOTE — Consult Note (Signed)
Ref: System, Pcp Not In   Subjective:  Awake but not talking. VS are stable. Monitor shows sinus rhythm with 1st degree AV block.  Objective:  Vital Signs in the last 24 hours: Temp:  [98.5 F (36.9 C)-99.6 F (37.6 C)] 99.6 F (37.6 C) (08/03 0513) Pulse Rate:  [85-97] 97 (08/03 0513) Cardiac Rhythm: Heart block (08/03 0930) Resp:  [18] 18 (08/03 0513) BP: (107-124)/(44-72) 124/44 (08/03 0513) SpO2:  [98 %-100 %] 98 % (08/03 0513)  Physical Exam: BP Readings from Last 1 Encounters:  08/28/17 (!) 124/44     Wt Readings from Last 1 Encounters:  08/27/17 86.1 kg (189 lb 13.1 oz)    Weight change:  Body mass index is 37.07 kg/m. HEENT: [place]/AT, Eyes-Brown, PERL, EOMI, Conjunctiva-Pale, Sclera-Non-icteric Neck: No JVD, No bruit, Trachea midline. Lungs:  Clear, Bilateral. Cardiac:  Regular rhythm, normal S1 and S2, no S3. II/VI systolic murmur. Abdomen:  Soft, non-tender. BS present. Extremities:  1 + edema present. No cyanosis. No clubbing. Right BKA. CNS: AxOx0, Cranial nerves grossly intact, moves all 4 extremities.  Skin: Warm and dry.   Intake/Output from previous day: 08/02 0701 - 08/03 0700 In: 5025 [I.V.:1309; NG/EX:5284] Out: 1324 [Urine:2000; Stool:2225]    Lab Results: BMET    Component Value Date/Time   NA 151 (H) 08/28/2017 0356   NA 155 (H) 08/27/2017 0800   NA 152 (H) 08/26/2017 0441   K 3.1 (L) 08/28/2017 0356   K 3.2 (L) 08/27/2017 0800   K 3.9 08/26/2017 0441   CL 130 (H) 08/28/2017 0356   CL >130 (HH) 08/27/2017 0800   CL 130 (H) 08/26/2017 0441   CO2 16 (L) 08/28/2017 0356   CO2 15 (L) 08/27/2017 0800   CO2 15 (L) 08/26/2017 0441   GLUCOSE 251 (H) 08/28/2017 0356   GLUCOSE 202 (H) 08/27/2017 0800   GLUCOSE 233 (H) 08/26/2017 0441   BUN 36 (H) 08/28/2017 0356   BUN 36 (H) 08/27/2017 0800   BUN 37 (H) 08/26/2017 0441   CREATININE 1.50 (H) 08/28/2017 0356   CREATININE 1.53 (H) 08/27/2017 0800   CREATININE 1.41 (H) 08/26/2017 0441   CALCIUM  7.9 (L) 08/28/2017 0356   CALCIUM 7.9 (L) 08/27/2017 0800   CALCIUM 7.9 (L) 08/26/2017 0441   GFRNONAA 32 (L) 08/28/2017 0356   GFRNONAA 31 (L) 08/27/2017 0800   GFRNONAA 35 (L) 08/26/2017 0441   GFRAA 37 (L) 08/28/2017 0356   GFRAA 36 (L) 08/27/2017 0800   GFRAA 40 (L) 08/26/2017 0441   CBC    Component Value Date/Time   WBC 7.9 08/28/2017 0356   RBC 3.05 (L) 08/28/2017 0356   HGB 8.3 (L) 08/28/2017 0356   HCT 27.3 (L) 08/28/2017 0356   HCT 26.7 (L) 08/24/2017 0400   PLT 339 08/28/2017 0356   MCV 89.5 08/28/2017 0356   MCH 27.2 08/28/2017 0356   MCHC 30.4 08/28/2017 0356   RDW 20.4 (H) 08/28/2017 0356   LYMPHSABS 2.4 07/30/2017 1946   MONOABS 0.1 08/15/2017 1946   EOSABS 0.0 08/06/2017 1946   BASOSABS 0.1 08/23/2017 1946   HEPATIC Function Panel Recent Labs    08/24/17 0408 08/25/17 0503 08/26/17 0441  PROT 4.7* 4.3* 4.7*   HEMOGLOBIN A1C No components found for: HGA1C,  MPG CARDIAC ENZYMES Lab Results  Component Value Date   CKTOTAL 93 04/28/2014   CKMB 1.6 11/27/2010   BNP No results for input(s): PROBNP in the last 8760 hours. TSH Recent Labs    04/23/17 2215  TSH 0.543   CHOLESTEROL No results for input(s): CHOL in the last 8760 hours.  Scheduled Meds: . chlorhexidine  15 mL Mouth Rinse BID  . diltiazem  60 mg Oral Q12H  . ferrous sulfate  300 mg Per Tube BID WC  . free water  250 mL Per Tube QID  . heparin injection (subcutaneous)  5,000 Units Subcutaneous Q8H  . insulin aspart  0-15 Units Subcutaneous Q4H  . insulin glargine  10 Units Subcutaneous Daily  . mouth rinse  15 mL Mouth Rinse q12n4p  . metoprolol tartrate  12.5 mg Oral BID  . pantoprazole sodium  40 mg Per Tube Daily  . [START ON 08/29/2017] potassium chloride  40 mEq Oral Daily   Continuous Infusions: . feeding supplement (VITAL AF 1.2 CAL) 60 mL/hr at 08/27/17 0826  . magnesium sulfate 1 - 4 g bolus IVPB 4 g (08/28/17 1132)  . potassium chloride 10 mEq (08/28/17 1131)  .  potassium chloride     PRN Meds:.acetaminophen (TYLENOL) oral liquid 160 mg/5 mL, albuterol, hydroxypropyl methylcellulose / hypromellose, midazolam, sodium chloride, sodium chloride flush  Assessment/Plan: Paroxysmal atrial fibrillation S/P large bowel perforation with surgery Anemia of blood loss and chronic disease Right BKA CKD, III Altered mental status Hypernatremia and hyper chloridemia Severe hypoalbuminemia Sinus rthyhm with 1st. Degree AV block  Continue small dose B-blocker and diltiazem for now. Increase dose if needed. Heart rate 80-105 is acceptable due to anemia and multiple medical conditions. Increase free fluid as tolerated.   LOS: 18 days    [name]  MD  08/28/2017, 12:24 PM

## 2017-08-28 NOTE — Progress Notes (Signed)
PROGRESS NOTE    Judith [name]  CHE:[id number] DOB: [birth year] DOA: 08/01/2017 PCP: System, Pcp Not In  Brief Narrative77 y.o.female with past medical history significant for hypertension, paroxysmal atrial fibrillation, left foot osteomyelitis, right below the knee amputation, who presented to [facility] ED on 08/24/2017 due to altered mental status. She was found to be in septic shock from perforated proximal descending colon and pneumoperitoneum. She underwent left hemicolectomy with mobilization of the splenic flexure, end colostomy and mucous fistula by surgery. Post op she required continual intubation and vasopressor use and was on PCCM service. Currently she is extubated and off pressors and medical service consulted for management of medical issues.  Hospital course complicated by persistent encephalopathy, severe protein calorie malnutrition, dysphagia and severe physical debility.    Assessment & Plan:   Principal Problem:   Perforation of colon s/p left colectomy/ostomy/mucus fistula 08/11/2017 Active Problems:   Acute respiratory failure with hypoxia (HCC)   S/P exploratory laparotomy   Septic shock (HCC)   Acute respiratory failure (HCC)   Atrial fibrillation (HCC)   Goals of care, counseling/discussion   DNR (do not resuscitate)   Intra-abdominal free air of unknown etiology   Severe protein-calorie malnutrition ([place])  Perforation of colon s/p left colectomy/ostomy/mucus fistula 08/11/2017 -Status post exploratory laparotomy for free air, left extended hemicolectomy, colostomy and mucous fistula, Dr. Kae Heller -Patient has completed a course of IV vancomycin and Zosyn, currently wound care RN following for ostomy care -On tube feeds, at goal, management per surgery  Active problems Acute metabolic encephalopathy -Multifactorial secondary to #1, acute illness, probable underlying dementia, severe malnutrition -Avoid sedating meds, better sleep hygiene, early ambulation  Acute  blood loss anemia/iron deficiency -Likely due to #1, H&H currently stable 8.3, received 2 units packed RBCs on 7/31.  Monitor.  Acute respiratory failure with hypoxia -Extubated on 7/25, O2 sats 100% on room air -Per goals of care DNR   Hypokalemia, acute kidney injury likely due to GI losses on CKD stage III -Replace potassium IV, creatinine trending up, -Placed on D5 water at 60 cc an hour  Hypernatremia -Sodium trending up with metabolic acidosis, 536  bicarb 16 creatinine trending up 1.5 -Continue free water, placed on D5 water at 60 cc an hour  Paroxysmal A. Fib-patient is on Cardizem 60 mg every 12, Lopressor 12.5 twice a day.  Followed by cardiology. -2D echo showed EF of 55 to 60% with grade 1 diastolic dysfunction  Moderate to severe protein calorie malnutrition, generalized debility -Palliative goals of care done on 7/31, DNR -Skilled nursing facility for long-term care with hospice recommended  Severe PVD status post Rt BKA -PT evaluation when able to      DVT prophylaxis subcu heparin Code Status DO NOT RESUSCITATE Family Communication: No family at bedside  Disposition Plan: Per surgery Consultants: Surgery and palliative care   Procedures: Exploratory laparotomy hemicolectomy and colostomy Antimicrobials:  Subjective:'s patient sleeping to rise when I called her name but then goes back to closed her eyes and sleep again   Objective: Vitals:   08/27/17 0756 08/27/17 1257 08/27/17 2036 08/28/17 0513  BP:  107/72 (!) 119/47 (!) 124/44  Pulse:  85 93 97  Resp:  18 18 18   Temp:  98.5 F (36.9 C) 99.1 F (37.3 C) 99.6 F (37.6 C)  TempSrc:  Oral Oral Oral  SpO2:  100%  98%  Weight: 86.1 kg (189 lb 13.1 oz)     Height:        Intake/Output  Summary (Last 24 hours) at 08/28/2017 0935 Last data filed at 08/28/2017 0600 Gross per 24 hour  Intake 5015 ml  Output 4225 ml  Net 790 ml   Filed Weights   08/25/17 0455 08/26/17 0500 08/27/17 0756    Weight: 82.6 kg (182 lb 1.6 oz) 85 kg (187 lb 6.3 oz) 86.1 kg (189 lb 13.1 oz)    Examination: Sleeping when called her name she opens her eyes for brief briefly and goes back to sleep does not follow commands  General exam: Appears calm and comfortable  Respiratory system: Clear to auscultation. Respiratory effort normal. Cardiovascular system: S1 & S2 heard, RRR. No JVD, murmurs, rubs, gallops or clicks. No pedal edema. Gastrointestinal system: Abdomen is nondistended, soft and nontender.  Incisions covered with dressings.  No organomegaly or masses felt. Normal bowel sounds heard. Central nervous system: Alert and oriented. No focal neurological deficits. Extremities: Right BKA 1+ edema in both lower extremities including the right stump. Skin: No rashes, lesions or ulcers Psychiatry: Judgement and insight appear normal. Mood & affect appropriate.     Data Reviewed: I have personally reviewed following labs and imaging studies  CBC: Recent Labs  Lab 08/24/17 0408 08/25/17 0503 08/25/17 1245 08/26/17 0441 08/28/17 0356  WBC 8.1 7.5 7.7 9.0 7.9  HGB 8.6* 7.6* 9.1* 9.3* 8.3*  HCT 28.0* 24.9* 28.7* 29.7* 27.3*  MCV 86.4 87.1 85.9 87.6 89.5  PLT 281 313 301 336 962   Basic Metabolic Panel: Recent Labs  Lab 08/23/17 0435  08/24/17 0408 08/24/17 1643 08/25/17 0503 08/26/17 0441 08/27/17 0800 08/28/17 0356  NA 151*   < > 153* 151* 150* 152* 155* 151*  K 3.3*   < > 3.5 3.2* 2.9* 3.9 3.2* 3.1*  CL 123*   < > 127* 125* 126* 130* >130* 130*  CO2 19*   < > 19* 17* 18* 15* 15* 16*  GLUCOSE 198*   < > 225* 284* 216* 233* 202* 251*  BUN 43*   < > 42* 42* 40* 37* 36* 36*  CREATININE 1.54*   < > 1.56* 1.52* 1.49* 1.41* 1.53* 1.50*  CALCIUM 7.7*   < > 7.9* 7.7* 7.7* 7.9* 7.9* 7.9*  MG 2.0  --  1.8  --  2.2  --   --  1.8  PHOS  --   --  3.5  --  3.3  --   --   --    < > = values in this interval not displayed.   GFR: Estimated Creatinine Clearance: 30.1 mL/min (A) (by C-G  formula based on SCr of 1.5 mg/dL (H)). Liver Function Tests: Recent Labs  Lab 08/24/17 0408 08/25/17 0503 08/26/17 0441  AST 21 16 19   ALT 18 14 15   ALKPHOS 118 95 99  BILITOT 0.8 0.6 0.8  PROT 4.7* 4.3* 4.7*  ALBUMIN 1.2* 1.1* 1.3*   No results for input(s): LIPASE, AMYLASE in the last 168 hours. No results for input(s): AMMONIA in the last 168 hours. Coagulation Profile: No results for input(s): INR, PROTIME in the last 168 hours. Cardiac Enzymes: No results for input(s): CKTOTAL, CKMB, CKMBINDEX, TROPONINI in the last 168 hours. BNP (last 3 results) No results for input(s): PROBNP in the last 8760 hours. HbA1C: No results for input(s): HGBA1C in the last 72 hours. CBG: Recent Labs  Lab 08/27/17 1224 08/27/17 2034 08/28/17 0021 08/28/17 0510 08/28/17 0757  GLUCAP 163* 258* 275* 237* 210*   Lipid Profile: No results for input(s): CHOL, HDL, LDLCALC,  TRIG, CHOLHDL, LDLDIRECT in the last 72 hours. Thyroid Function Tests: No results for input(s): TSH, T4TOTAL, FREET4, T3FREE, THYROIDAB in the last 72 hours. Anemia Panel: No results for input(s): VITAMINB12, FOLATE, FERRITIN, TIBC, IRON, RETICCTPCT in the last 72 hours. Sepsis Labs: No results for input(s): PROCALCITON, LATICACIDVEN in the last 168 hours.  No results found for this or any previous visit (from the past 240 hour(s)).       Radiology Studies: Dg Swallowing Func-speech Pathology  Result Date: 08/26/2017 Objective Swallowing Evaluation: Type of Study: MBS-Modified Barium Swallow Study  Patient Details Name: SHAQUAYA WUELLNER MRN: [id number] Date of Birth: 23-Jul-1940 Today's Date: 08/26/2017 Time: SLP Start Time (ACUTE ONLY): 1015 -SLP Stop Time (ACUTE ONLY): 1030 SLP Time Calculation (min) (ACUTE ONLY): 15 min Past Medical History: Past Medical History: Diagnosis Date . Anxiety  . Arthritis   "left leg" (05/17/2013) . Asthma  . Charcot's joint of foot due to diabetes ([place])  . Closed right ankle fracture  . Colitis  09/06/02 . Diabetes mellitus without complication ([place])  . Fracture of femur, distal, right, closed ([place]) 03/01/2012 . GERD (gastroesophageal reflux disease)  . SPQZRAQT(622.6)   "probably weekly" (05/17/2013) . Hemorrhoid  . High cholesterol  . History of stomach ulcers  . Hyperlipidemia  . Hypertension  . Insulin dependent diabetes mellitus ([place]) 03/01/2012 . Obesity (BMI 30-39.9) 03/03/2012 . Perforated viscus 08/14/2017 . Pneumonia   "once" (05/17/2013) . Proctocolitis 09/04/02 Past Surgical History: Past Surgical History: Procedure Laterality Date . ABDOMINAL HYSTERECTOMY   . BELOW KNEE LEG AMPUTATION Right 02/2008 . BREAST LUMPECTOMY Bilateral   "not cancer" . CATARACT EXTRACTION W/ INTRAOCULAR LENS  IMPLANT, BILATERAL Bilateral  . LAPAROTOMY N/A 08/25/2017  Procedure: EXPLORATORY LAPAROTOMY FOR FREE AIR, LEFT EXTENDED HEMICOLECTOMY, COLOSTOMY AND MUCUS FISTULA;  Surgeon: Clovis Riley, MD;  Location: [facility];  Service: General;  Laterality: N/A; . ORIF FEMUR FRACTURE  03/01/2012  Procedure: OPEN REDUCTION INTERNAL FIXATION (ORIF) DISTAL FEMUR FRACTURE;  Surgeon: Rozanna Box, MD;  Location: [facility];  Service: Orthopedics;  Laterality: Right;  ORIF right femur HPI: 77 y.o. female  with past medical history of IDDM, charcot's joint, dementia, anxiety, proctocolitis, PUD, right BKA, asthma, and arthritis who was admitted on 07/26/2017 with septic shock.  She was found to have a bowel perforation and was taken to the OR.  She received a hemicolectomy with colostomy and mucous fistula. Intubated 08/11/17 with one-way extubation on 08/19/17. Per palliative, prognosis <2 weeks in setting of hypotensive with Afib, severely deconditioned, malnourished, demented, with a large abdominal wound. No tracheostomy, no PEG.  Subjective: weakly states name Assessment / Plan / Recommendation CHL IP CLINICAL IMPRESSIONS 08/26/2017 Clinical Impression Difficult study, pt lethargic but opened eyes and responded to PO presentation. Initially pt  became drowsy while orally holding PO, requiring deep suction without gag. After this however, pt woke up enough to more actively orally manipulate a bite of puree. Oral phase still prolonged with extended lingual pumping and inefficient oral transit followed by delayed, weak ineffective swallow response with sever vallecular residue (mild residue below CP noted). Pharyngeal mobility certainly impacted by large bore NG, however pt too lethargic to warrant removal today as she will need ongoing alternate method of nutrition over the next 24 hours. Suggested removal of NG tomorrow with replacement of a Cortrak which would allow greater progress with diagnostic PO trials. WIll follow for readiness for PO vs further objective testing.  SLP Visit Diagnosis Dysphagia, unspecified (R13.10) Attention and concentration deficit  following -- Frontal lobe and executive function deficit following -- Impact on safety and function Severe aspiration risk;Risk for inadequate nutrition/hydration   CHL IP TREATMENT RECOMMENDATION 08/26/2017 Treatment Recommendations Therapy as outlined in treatment plan below   Prognosis 08/26/2017 Prognosis for Safe Diet Advancement Guarded Barriers to Reach Goals Other (Comment) Barriers/Prognosis Comment -- CHL IP DIET RECOMMENDATION 08/26/2017 SLP Diet Recommendations NPO;Alternative means - temporary Liquid Administration via -- Medication Administration Via alternative means Compensations -- Postural Changes --   No flowsheet data found.  CHL IP FOLLOW UP RECOMMENDATIONS 08/26/2017 Follow up Recommendations Skilled Nursing facility   [facility] IP FREQUENCY AND DURATION 08/26/2017 Speech Therapy Frequency (ACUTE ONLY) min 2x/week Treatment Duration 2 weeks      CHL IP ORAL PHASE 08/26/2017 Oral Phase Impaired Oral - Pudding Teaspoon -- Oral - Pudding Cup -- Oral - Honey Teaspoon -- Oral - Honey Cup -- Oral - Nectar Teaspoon Lingual/palatal residue;Piecemeal swallowing;Decreased bolus cohesion;Delayed oral  transit;Lingual pumping;Incomplete tongue to palate contact;Right anterior bolus loss;Left anterior bolus loss Oral - Nectar Cup -- Oral - Nectar Straw -- Oral - Thin Teaspoon -- Oral - Thin Cup -- Oral - Thin Straw -- Oral - Puree Lingual/palatal residue;Piecemeal swallowing;Decreased bolus cohesion;Delayed oral transit;Lingual pumping;Incomplete tongue to palate contact Oral - Mech Soft -- Oral - Regular -- Oral - Multi-Consistency -- Oral - Pill -- Oral Phase - Comment --  CHL IP PHARYNGEAL PHASE 08/26/2017 Pharyngeal Phase Impaired Pharyngeal- Pudding Teaspoon -- Pharyngeal -- Pharyngeal- Pudding Cup -- Pharyngeal -- Pharyngeal- Honey Teaspoon -- Pharyngeal -- Pharyngeal- Honey Cup -- Pharyngeal -- Pharyngeal- Nectar Teaspoon Delayed swallow initiation-vallecula;Reduced epiglottic inversion;Reduced pharyngeal peristalsis;Reduced anterior laryngeal mobility;Reduced laryngeal elevation;Reduced tongue base retraction;Pharyngeal residue - valleculae;Pharyngeal residue - pyriform Pharyngeal -- Pharyngeal- Nectar Cup -- Pharyngeal -- Pharyngeal- Nectar Straw -- Pharyngeal -- Pharyngeal- Thin Teaspoon -- Pharyngeal -- Pharyngeal- Thin Cup -- Pharyngeal -- Pharyngeal- Thin Straw -- Pharyngeal -- Pharyngeal- Puree Delayed swallow initiation-vallecula;Reduced epiglottic inversion;Reduced pharyngeal peristalsis;Reduced anterior laryngeal mobility;Reduced laryngeal elevation;Reduced tongue base retraction;Pharyngeal residue - valleculae;Pharyngeal residue - pyriform Pharyngeal -- Pharyngeal- Mechanical Soft -- Pharyngeal -- Pharyngeal- Regular -- Pharyngeal -- Pharyngeal- Multi-consistency -- Pharyngeal -- Pharyngeal- Pill -- Pharyngeal -- Pharyngeal Comment --  No flowsheet data found. DeBlois, Katherene Ponto 08/26/2017, 11:53 AM                   Scheduled Meds: . chlorhexidine  15 mL Mouth Rinse BID  . diltiazem  60 mg Oral Q12H  . ferrous sulfate  300 mg Per Tube BID WC  . free water  250 mL Per Tube QID  .  heparin injection (subcutaneous)  5,000 Units Subcutaneous Q8H  . insulin aspart  0-15 Units Subcutaneous Q4H  . insulin glargine  5 Units Subcutaneous Daily  . mouth rinse  15 mL Mouth Rinse q12n4p  . metoprolol tartrate  12.5 mg Oral BID  . pantoprazole sodium  40 mg Per Tube Daily   Continuous Infusions: . dextrose 60 mL/hr at 08/28/17 0549  . feeding supplement (VITAL AF 1.2 CAL) 60 mL/hr at 08/27/17 0826     LOS: 18 days      Georgette Shell, MD [facility]  If 7PM-7AM, please contact night-coverage [email or web address] Password [email or web address] 08/28/2017, 9:35 AM

## 2017-08-29 DIAGNOSIS — G9341 Metabolic encephalopathy: Secondary | ICD-10-CM

## 2017-08-29 DIAGNOSIS — N183 Chronic kidney disease, stage 3 unspecified: Secondary | ICD-10-CM

## 2017-08-29 LAB — CBC
HCT: 28.5 % — ABNORMAL LOW (ref 36.0–46.0)
Hemoglobin: 8.7 g/dL — ABNORMAL LOW (ref 12.0–15.0)
MCH: 27.1 pg (ref 26.0–34.0)
MCHC: 30.5 g/dL (ref 30.0–36.0)
MCV: 88.8 fL (ref 78.0–100.0)
Platelets: 342 10*3/uL (ref 150–400)
RBC: 3.21 MIL/uL — ABNORMAL LOW (ref 3.87–5.11)
RDW: 20.3 % — ABNORMAL HIGH (ref 11.5–15.5)
WBC: 9.1 10*3/uL (ref 4.0–10.5)

## 2017-08-29 LAB — COMPREHENSIVE METABOLIC PANEL
ALT: 14 U/L (ref 0–44)
ANION GAP: 5 (ref 5–15)
AST: 11 U/L — ABNORMAL LOW (ref 15–41)
Albumin: 1.2 g/dL — ABNORMAL LOW (ref 3.5–5.0)
Alkaline Phosphatase: 95 U/L (ref 38–126)
BUN: 35 mg/dL — ABNORMAL HIGH (ref 8–23)
CO2: 15 mmol/L — AB (ref 22–32)
Calcium: 7.9 mg/dL — ABNORMAL LOW (ref 8.9–10.3)
Chloride: 127 mmol/L — ABNORMAL HIGH (ref 98–111)
Creatinine, Ser: 1.46 mg/dL — ABNORMAL HIGH (ref 0.44–1.00)
GFR calc Af Amer: 39 mL/min — ABNORMAL LOW (ref >60)
GFR calc non Af Amer: 33 mL/min — ABNORMAL LOW (ref >60)
Glucose, Bld: 242 mg/dL — ABNORMAL HIGH (ref 70–99)
Potassium: 3.9 mmol/L (ref 3.5–5.1)
SODIUM: 147 mmol/L — AB (ref 135–145)
Total Bilirubin: 0.5 mg/dL (ref 0.3–1.2)
Total Protein: 4.9 g/dL — ABNORMAL LOW (ref 6.5–8.1)

## 2017-08-29 LAB — GLUCOSE, CAPILLARY
Glucose-Capillary: 223 mg/dL — ABNORMAL HIGH (ref 70–99)
Glucose-Capillary: 224 mg/dL — ABNORMAL HIGH (ref 70–99)
Glucose-Capillary: 257 mg/dL — ABNORMAL HIGH (ref 70–99)
Glucose-Capillary: 259 mg/dL — ABNORMAL HIGH (ref 70–99)

## 2017-08-29 LAB — MAGNESIUM: MAGNESIUM: 2.5 mg/dL — AB (ref 1.7–2.4)

## 2017-08-29 NOTE — Progress Notes (Addendum)
PROGRESS NOTE    Judith [name]  HQI:[id number] DOB: 03/21/1940 DOA: 08/21/2017 PCP: System, Pcp Not In  Brief Narrative:77 y.o.female with past medical history significant for hypertension, paroxysmal atrial fibrillation, left foot osteomyelitis, right below the knee amputation, who presented to [facility] ED on 08/18/2017 due to altered mental status. She was found to be in septic shock from perforated proximal descending colon and pneumoperitoneum. She underwent left hemicolectomy with mobilization of the splenic flexure, end colostomy and mucous fistula by surgery. Post op she required continual intubation and vasopressor use and was on PCCM service. Currently she is extubated and off pressors and medical service consulted for management of medical issues.  Hospital course complicated by persistent encephalopathy, severe protein calorie malnutrition, dysphagia and severe physical debility.    Assessment & Plan:   Principal Problem:   Metabolic encephalopathy Active Problems:   Hypertension   Insulin dependent diabetes mellitus (HCC)   Hx of right BKA (HCC)   Obesity (BMI 30-39.9)   Dementia   AKI (acute kidney injury) ([place])   Encounter for hospice care discussion   Perforation of colon s/p left colectomy/ostomy/mucus fistula 08/11/2017   Acute respiratory failure with hypoxia (HCC)   S/P exploratory laparotomy   Septic shock (HCC)   Acute respiratory failure (HCC)   Atrial fibrillation (HCC)   Goals of care, counseling/discussion   DNR (do not resuscitate)   Severe protein-calorie malnutrition (HCC)   CKD (chronic kidney disease) stage 3, GFR 30-59 ml/min (HCC)   Perforation of colon s/p left colectomy/ostomy/mucus fistula 08/11/2017 -Status post exploratory laparotomy for free air, left extended hemicolectomy, colostomy and mucous fistula, Dr. Kae Heller -Patient has completed a course of IV vancomycin and Zosyn, currently wound care RN following for ostomy care -On tube feeds, at goal,  management per surgery  Active problems Acute metabolic encephalopathy -Multifactorial secondary to #1, acute illness, probable underlying dementia, severe malnutrition -Avoid sedating meds, better sleep hygiene, early ambulation  Acute blood loss anemia/iron deficiency -Likely due to #1, H&H currently stable 8.7, received 2 units packed RBCs on 7/31.  Monitor.  Acute respiratory failure with hypoxia -Extubated on 7/25,O2 sats100%on room air -Per goals of care DNR   Hypokalemia, acute kidney injury likely due to GI losses on CKD stage III potassium replaced stable.  Hypernatremia improved sodium 147 today DC D5.  Type 2 diabetes blood sugar over 200 since starting tube feeds increase the dose of Lantus.  Atrial fibrillation rate controlled on Lopressor 12.5 twice a day, Cardizem 60 mg twice a day.  Follow-by cardiologist.   DVT prophylaxis: Heparin   Code Status: DO NOT RESUSCITATE Family Commun: No family at bedside Disposition Plan: Per surgery   Consultants: Surgery and palliative care   Procedures: Exploratory laparotomy, hemicolectomy, colostomy Antimicrobials:  Subjective:  Patient sleeping when I called her name she does not open her eyes but when I do press her hard she she grimaces.  Objective: Vitals:   08/28/17 0513 08/28/17 1354 08/28/17 2235 08/29/17 0428  BP: (!) 124/44 (!) 101/39 (!) 113/42 (!) 112/49  Pulse: 97 84 79 89  Resp: 18  18 18   Temp: 99.6 F (37.6 C) 98.2 F (36.8 C) 98.9 F (37.2 C) 99.1 F (37.3 C)  TempSrc: Oral Oral Axillary Axillary  SpO2: 98% 100% 99% 98%  Weight:      Height:        Intake/Output Summary (Last 24 hours) at 08/29/2017 1208 Last data filed at 08/29/2017 0556 Gross per 24 hour  Intake 1100  ml  Output 2950 ml  Net -1850 ml   Filed Weights   08/25/17 0455 08/26/17 0500 08/27/17 0756  Weight: 82.6 kg (182 lb 1.6 oz) 85 kg (187 lb 6.3 oz) 86.1 kg (189 lb 13.1 oz)    Examination:  General exam: Appears  calm and comfortable  Respiratory system: Clear to auscultation. Respiratory effort normal. Cardiovascular system: S1 & S2 heard, RRR. No JVD, murmurs, rubs, gallops or clicks. No pedal edema. Gastrointestinal system: Abdomen is nondistended, soft and nontender.  Dressings on. Central nervous system: Alert and oriented. No focal neurological deficits. Extremities: Symmetric 5 x 5 power. Skin: No rashes, lesions or ulcers Psychiatry: Judgement and insight appear normal. Mood & affect appropriate.     Data Reviewed: I have personally reviewed following labs and imaging studies  CBC: Recent Labs  Lab 08/25/17 0503 08/25/17 1245 08/26/17 0441 08/28/17 0356 08/29/17 0358  WBC 7.5 7.7 9.0 7.9 9.1  HGB 7.6* 9.1* 9.3* 8.3* 8.7*  HCT 24.9* 28.7* 29.7* 27.3* 28.5*  MCV 87.1 85.9 87.6 89.5 88.8  PLT 313 301 336 339 224   Basic Metabolic Panel: Recent Labs  Lab 08/23/17 0435  08/24/17 0408  08/25/17 0503 08/26/17 0441 08/27/17 0800 08/28/17 0356 08/29/17 0358  NA 151*   < > 153*   < > 150* 152* 155* 151* 147*  K 3.3*   < > 3.5   < > 2.9* 3.9 3.2* 3.1* 3.9  CL 123*   < > 127*   < > 126* 130* >130* 130* 127*  CO2 19*   < > 19*   < > 18* 15* 15* 16* 15*  GLUCOSE 198*   < > 225*   < > 216* 233* 202* 251* 242*  BUN 43*   < > 42*   < > 40* 37* 36* 36* 35*  CREATININE 1.54*   < > 1.56*   < > 1.49* 1.41* 1.53* 1.50* 1.46*  CALCIUM 7.7*   < > 7.9*   < > 7.7* 7.9* 7.9* 7.9* 7.9*  MG 2.0  --  1.8  --  2.2  --   --  1.8 2.5*  PHOS  --   --  3.5  --  3.3  --   --   --   --    < > = values in this interval not displayed.   GFR: Estimated Creatinine Clearance: 30.9 mL/min (A) (by C-G formula based on SCr of 1.46 mg/dL (H)). Liver Function Tests: Recent Labs  Lab 08/24/17 0408 08/25/17 0503 08/26/17 0441 08/29/17 0358  AST 21 16 19  11*  ALT 18 14 15 14   ALKPHOS 118 95 99 95  BILITOT 0.8 0.6 0.8 0.5  PROT 4.7* 4.3* 4.7* 4.9*  ALBUMIN 1.2* 1.1* 1.3* 1.2*   No results for input(s):  LIPASE, AMYLASE in the last 168 hours. No results for input(s): AMMONIA in the last 168 hours. Coagulation Profile: No results for input(s): INR, PROTIME in the last 168 hours. Cardiac Enzymes: No results for input(s): CKTOTAL, CKMB, CKMBINDEX, TROPONINI in the last 168 hours. BNP (last 3 results) No results for input(s): PROBNP in the last 8760 hours. HbA1C: No results for input(s): HGBA1C in the last 72 hours. CBG: Recent Labs  Lab 08/28/17 1223 08/28/17 1754 08/29/17 0006 08/29/17 0538 08/29/17 1118  GLUCAP 252* 205* 259* 257* 224*   Lipid Profile: No results for input(s): CHOL, HDL, LDLCALC, TRIG, CHOLHDL, LDLDIRECT in the last 72 hours. Thyroid Function Tests: No results for input(s): TSH,  T4TOTAL, FREET4, T3FREE, THYROIDAB in the last 72 hours. Anemia Panel: No results for input(s): VITAMINB12, FOLATE, FERRITIN, TIBC, IRON, RETICCTPCT in the last 72 hours. Sepsis Labs: No results for input(s): PROCALCITON, LATICACIDVEN in the last 168 hours.  No results found for this or any previous visit (from the past 240 hour(s)).       Radiology Studies: No results found.      Scheduled Meds: . chlorhexidine  15 mL Mouth Rinse BID  . diltiazem  60 mg Oral Q12H  . ferrous sulfate  300 mg Per Tube BID WC  . free water  250 mL Per Tube QID  . heparin injection (subcutaneous)  5,000 Units Subcutaneous Q8H  . insulin aspart  0-15 Units Subcutaneous Q6H  . insulin glargine  10 Units Subcutaneous Daily  . mouth rinse  15 mL Mouth Rinse q12n4p  . metoprolol tartrate  12.5 mg Oral BID  . pantoprazole sodium  40 mg Per Tube Daily  . potassium chloride  40 mEq Oral Daily   Continuous Infusions: . feeding supplement (VITAL AF 1.2 CAL) 1,500 mL (08/29/17 0549)     LOS: 19 days     Georgette Shell, MD [facility]  If 7PM-7AM, please contact night-coverage [email or web address] Password [email or web address] 08/29/2017, 12:08 PM

## 2017-08-29 NOTE — Progress Notes (Signed)
[facility] Progress Note  19 Days Post-Op  Subjective: CC: no major events.  TF via corpak  Objective: Vital signs in last 24 hours: Temp:  [98.2 F (36.8 C)-99.1 F (37.3 C)] 99.1 F (37.3 C) (08/04 0428) Pulse Rate:  [79-89] 89 (08/04 0428) Resp:  [18] 18 (08/04 0428) BP: (101-113)/(39-49) 112/49 (08/04 0428) SpO2:  [98 %-100 %] 98 % (08/04 0428) Last BM Date: 08/28/17  Intake/Output from previous day: 08/03 0701 - 08/04 0700 In: 1100 [NG/GT:1100] Out: 2950 [Urine:1200; Drains:50; UDJSH:7026] Intake/Output this shift: No intake/output data recorded.  PE: Gen:  awake, eyes open, no follow commands no response except stares/follows, in NAD Card:  Regular rate and rhythm Pulm:  Normal effort, clear to auscultation bilaterally Abd: Soft, non-tender, non-distended, midline wound with dressing clean and dry, ostomy and mucous fistula viable with stool in pouch.       Skin: warm and dry, no rashes  Psych: unable to assess Neuro: not opening eyes to sternal rub or pain. Non-verbal this AM.  Lab Results:  Recent Labs    08/28/17 0356 08/29/17 0358  WBC 7.9 9.1  HGB 8.3* 8.7*  HCT 27.3* 28.5*  PLT 339 342   BMET Recent Labs    08/28/17 0356 08/29/17 0358  NA 151* 147*  K 3.1* 3.9  CL 130* 127*  CO2 16* 15*  GLUCOSE 251* 242*  BUN 36* 35*  CREATININE 1.50* 1.46*  CALCIUM 7.9* 7.9*   PT/INR No results for input(s): LABPROT, INR in the last 72 hours. CMP     Component Value Date/Time   NA 147 (H) 08/29/2017 0358   K 3.9 08/29/2017 0358   CL 127 (H) 08/29/2017 0358   CO2 15 (L) 08/29/2017 0358   GLUCOSE 242 (H) 08/29/2017 0358   BUN 35 (H) 08/29/2017 0358   CREATININE 1.46 (H) 08/29/2017 0358   CALCIUM 7.9 (L) 08/29/2017 0358   PROT 4.9 (L) 08/29/2017 0358   ALBUMIN 1.2 (L) 08/29/2017 0358   AST 11 (L) 08/29/2017 0358   ALT 14 08/29/2017 0358   ALKPHOS 95 08/29/2017 0358   BILITOT 0.5 08/29/2017 0358   GFRNONAA 33 (L) 08/29/2017 0358     GFRAA 39 (L) 08/29/2017 0358   Lipase     Component Value Date/Time   LIPASE 70 (H) 08/23/2017 1954       Studies/Results: No results found.  Anti-infectives: Anti-infectives (From admission, onward)   Start     Dose/Rate Route Frequency Ordered Stop   08/18/17 1200  anidulafungin (ERAXIS) 100 mg in sodium chloride 0.9 % 100 mL IVPB     100 mg 78 mL/hr over 100 Minutes Intravenous Every 24 hours 08/18/17 0907 08/20/17 1432   08/18/17 1000  piperacillin-tazobactam (ZOSYN) IVPB 3.375 g     3.375 g 12.5 mL/hr over 240 Minutes Intravenous Every 8 hours 08/18/17 0907 08/20/17 2250   08/13/17 1100  anidulafungin (ERAXIS) 100 mg in sodium chloride 0.9 % 100 mL IVPB  Status:  Discontinued     100 mg 78 mL/hr over 100 Minutes Intravenous Every 24 hours 08/12/17 1048 08/17/17 1308   08/12/17 1100  anidulafungin (ERAXIS) 200 mg in sodium chloride 0.9 % 200 mL IVPB     200 mg 78 mL/hr over 200 Minutes Intravenous  Once 08/12/17 1048 08/12/17 1648   08/11/17 0600  piperacillin-tazobactam (ZOSYN) IVPB 3.375 g     3.375 g 12.5 mL/hr over 240 Minutes Intravenous Every 8 hours 08/11/17 0025 08/17/17 2359   08/11/17 0115  vancomycin (VANCOCIN) IVPB 750 mg/150 ml premix     750 mg 150 mL/hr over 60 Minutes Intravenous Every 24 hours 08/11/17 0110 08/17/17 0211   08/11/17 0045  piperacillin-tazobactam (ZOSYN) IVPB 3.375 g     3.375 g 100 mL/hr over 30 Minutes Intravenous  Once 08/11/17 0033 08/11/17 0206   08/09/2017 2000  piperacillin-tazobactam (ZOSYN) IVPB 3.375 g  Status:  Discontinued     3.375 g 100 mL/hr over 30 Minutes Intravenous  Once 08/09/2017 1952 08/11/17 0025     Assessment/Plan s/pProcedure(s): EXPLORATORY LAPAROTOMY FOR FREE AIR, LEFT EXTENDED HEMICOLECTOMY, COLOSTOMY AND MUCUS FISTULA (N/A)  Perforated proximal descending colon Extended left hemicolectomy with distal transverse colostomy and mucous fistula creation, 08/11/17,Dr Kae Heller. POD#17 -TFs atgoal rate and  tolerating well with corpak tube - think it will help with swallow eval and trial -BID midline dressing changesand prn for saturation from serous drainage -WOC RN for ostomy care, currently using small eakin pouch to bedside drainage. -WBCnormal, antibiotic course complete.  Sepsis-secondary to #1,patientoff pressors, abx therapydiscontinued7/26  Thrombocytopenia- resolved  VDRF- extubated 6:30 PM 7/25 - last ETTper family, stable  DM- SSI for now  HTN- resolved,BB on hold  AKI on CKD stage III-Creatinine1.41 from 1.56  Hypokalemia -3.1 this am, will replace again.    HypoMAg - replaced - recheck  Severe malnutrition - tube feeds/Speech evaluation for swallowing  ABL anemia- H/H 9.3/29.7 s/p transfusion 2u pRBC 7/31  Paroxysmal A.Fib- cards following, diltiazem and beta blocker    Hx of R BKA secondary to peripheral vascular disease- L toes dark, good doppler pulse, continue current care  Acute metabolic encephalopathy- multifactoral, followed simple commands this AM.  Most telling part of her prognosis  Hypernatremia- continue to monitor  Goals of care - palliative following and greatly appreciate their support; PMT meetingtentativelyscheduled for 8/7 at 3:00 pm. Pt remains DNR, No PEG, No invasive procedures. Anticipate D/C to SNF for long term care with Hospice at some point.  FEN -place cortrak, restart TF once cortrak placed. TF @ goal, SLP following  VTE - SCDs, Heparin previously held for low platelets/anemia, restart subcu heparin 8/2  ID -Zosyn 7/16-->Vanc 7/16-->Eraxis 7/18 -->all abx stopped 08/17/17.(Calcitonin 32) Zosyn/Eraxis restarted 7/24>>7/26  Consider wound vac    LOS: 19 days   Adin Hector, MD, FACS, MASCRS Gastrointestinal and Minimally Invasive Surgery    [address] [place], [place] [address] [phone] Main / Paging [phone] Fax

## 2017-08-29 NOTE — Consult Note (Signed)
Ref: System, Pcp Not In   Subjective:  Awake. Nodding head appropriately but slowly. T max 99.1 degree F. Off monitor but heart rate under control.  Objective:  Vital Signs in the last 24 hours: Temp:  [98.2 F (36.8 C)-99.1 F (37.3 C)] 99.1 F (37.3 C) (08/04 0428) Pulse Rate:  [79-89] 89 (08/04 0428) Resp:  [18] 18 (08/04 0428) BP: (101-113)/(39-49) 112/49 (08/04 0428) SpO2:  [98 %-100 %] 98 % (08/04 0428)  Physical Exam: BP Readings from Last 1 Encounters:  08/29/17 (!) 112/49     Wt Readings from Last 1 Encounters:  08/27/17 86.1 kg (189 lb 13.1 oz)    Weight change:  Body mass index is 37.07 kg/m. HEENT: [place]/AT, Eyes-Brown, Conjunctiva-Pale, Sclera-Non-icteric Neck: No JVD, No bruit, Trachea midline. Lungs:  Clear, Bilateral. Cardiac:  Regular rhythm, normal S1 and S2, no S3. II/VI systolic murmur. Abdomen:  Soft, non-tender. BS present. Extremities:  Trace edema present. No cyanosis. No clubbing. CNS: AxOx1, Cranial nerves grossly intact, moves all 4 extremities.  Skin: Warm and dry.   Intake/Output from previous day: 08/03 0701 - 08/04 0700 In: 1100 [NG/GT:1100] Out: 2950 [Urine:1200; Drains:50; Stool:1700]    Lab Results: BMET    Component Value Date/Time   NA 147 (H) 08/29/2017 0358   NA 151 (H) 08/28/2017 0356   NA 155 (H) 08/27/2017 0800   K 3.9 08/29/2017 0358   K 3.1 (L) 08/28/2017 0356   K 3.2 (L) 08/27/2017 0800   CL 127 (H) 08/29/2017 0358   CL 130 (H) 08/28/2017 0356   CL >130 (HH) 08/27/2017 0800   CO2 15 (L) 08/29/2017 0358   CO2 16 (L) 08/28/2017 0356   CO2 15 (L) 08/27/2017 0800   GLUCOSE 242 (H) 08/29/2017 0358   GLUCOSE 251 (H) 08/28/2017 0356   GLUCOSE 202 (H) 08/27/2017 0800   BUN 35 (H) 08/29/2017 0358   BUN 36 (H) 08/28/2017 0356   BUN 36 (H) 08/27/2017 0800   CREATININE 1.46 (H) 08/29/2017 0358   CREATININE 1.50 (H) 08/28/2017 0356   CREATININE 1.53 (H) 08/27/2017 0800   CALCIUM 7.9 (L) 08/29/2017 0358   CALCIUM 7.9 (L)  08/28/2017 0356   CALCIUM 7.9 (L) 08/27/2017 0800   GFRNONAA 33 (L) 08/29/2017 0358   GFRNONAA 32 (L) 08/28/2017 0356   GFRNONAA 31 (L) 08/27/2017 0800   GFRAA 39 (L) 08/29/2017 0358   GFRAA 37 (L) 08/28/2017 0356   GFRAA 36 (L) 08/27/2017 0800   CBC    Component Value Date/Time   WBC 9.1 08/29/2017 0358   RBC 3.21 (L) 08/29/2017 0358   HGB 8.7 (L) 08/29/2017 0358   HCT 28.5 (L) 08/29/2017 0358   HCT 26.7 (L) 08/24/2017 0400   PLT 342 08/29/2017 0358   MCV 88.8 08/29/2017 0358   MCH 27.1 08/29/2017 0358   MCHC 30.5 08/29/2017 0358   RDW 20.3 (H) 08/29/2017 0358   LYMPHSABS 2.4 08/19/2017 1946   MONOABS 0.1 08/21/2017 1946   EOSABS 0.0 08/09/2017 1946   BASOSABS 0.1 08/11/2017 1946   HEPATIC Function Panel Recent Labs    08/25/17 0503 08/26/17 0441 08/29/17 0358  PROT 4.3* 4.7* 4.9*   HEMOGLOBIN A1C No components found for: HGA1C,  MPG CARDIAC ENZYMES Lab Results  Component Value Date   CKTOTAL 93 04/28/2014   CKMB 1.6 11/27/2010   BNP No results for input(s): PROBNP in the last 8760 hours. TSH Recent Labs    04/23/17 2215  TSH 0.543   CHOLESTEROL No results for  input(s): CHOL in the last 8760 hours.  Scheduled Meds: . chlorhexidine  15 mL Mouth Rinse BID  . diltiazem  60 mg Oral Q12H  . ferrous sulfate  300 mg Per Tube BID WC  . free water  250 mL Per Tube QID  . heparin injection (subcutaneous)  5,000 Units Subcutaneous Q8H  . insulin aspart  0-15 Units Subcutaneous Q6H  . insulin glargine  10 Units Subcutaneous Daily  . mouth rinse  15 mL Mouth Rinse q12n4p  . metoprolol tartrate  12.5 mg Oral BID  . pantoprazole sodium  40 mg Per Tube Daily  . potassium chloride  40 mEq Oral Daily   Continuous Infusions: . feeding supplement (VITAL AF 1.2 CAL) 1,500 mL (08/29/17 0549)   PRN Meds:.acetaminophen (TYLENOL) oral liquid 160 mg/5 mL, albuterol, hydroxypropyl methylcellulose / hypromellose, midazolam, sodium chloride, sodium chloride  flush  Assessment/Plan: Paroxysmal atrial fibrillation S/P Descending colon perforation S/P Extended left hemicolectomy S/P Distal transverse colostomy and mucous fistula creation Altered mental status, slowly improving Anemia of blood loss and chronic disease Right BKA CKD, III Hypernatremia, improving Hyper chloridemia Severe hypoproteinemia Sinus rhythm with 1st degree AV block.  Continue B-blocker and diltiazem. Continue free fluid.   LOS: 19 days    [name]  MD  08/29/2017, 12:03 PM

## 2017-08-30 LAB — COMPREHENSIVE METABOLIC PANEL
ALBUMIN: 1.1 g/dL — AB (ref 3.5–5.0)
ALK PHOS: 95 U/L (ref 38–126)
ALT: 14 U/L (ref 0–44)
ANION GAP: 5 (ref 5–15)
AST: 12 U/L — AB (ref 15–41)
BILIRUBIN TOTAL: 0.7 mg/dL (ref 0.3–1.2)
BUN: 39 mg/dL — AB (ref 8–23)
CALCIUM: 7.8 mg/dL — AB (ref 8.9–10.3)
CO2: 15 mmol/L — ABNORMAL LOW (ref 22–32)
Chloride: 128 mmol/L — ABNORMAL HIGH (ref 98–111)
Creatinine, Ser: 1.52 mg/dL — ABNORMAL HIGH (ref 0.44–1.00)
GFR calc Af Amer: 37 mL/min — ABNORMAL LOW (ref >60)
GFR calc non Af Amer: 32 mL/min — ABNORMAL LOW (ref >60)
GLUCOSE: 292 mg/dL — AB (ref 70–99)
Potassium: 3.9 mmol/L (ref 3.5–5.1)
Sodium: 148 mmol/L — ABNORMAL HIGH (ref 135–145)
TOTAL PROTEIN: 4.8 g/dL — AB (ref 6.5–8.1)

## 2017-08-30 LAB — GLUCOSE, CAPILLARY
GLUCOSE-CAPILLARY: 225 mg/dL — AB (ref 70–99)
Glucose-Capillary: 201 mg/dL — ABNORMAL HIGH (ref 70–99)
Glucose-Capillary: 268 mg/dL — ABNORMAL HIGH (ref 70–99)
Glucose-Capillary: 297 mg/dL — ABNORMAL HIGH (ref 70–99)
Glucose-Capillary: 295 mg/dL — ABNORMAL HIGH (ref 70–99)

## 2017-08-30 LAB — CBC
HEMATOCRIT: 27.9 % — AB (ref 36.0–46.0)
HEMOGLOBIN: 8.4 g/dL — AB (ref 12.0–15.0)
MCH: 27.1 pg (ref 26.0–34.0)
MCHC: 30.1 g/dL (ref 30.0–36.0)
MCV: 90 fL (ref 78.0–100.0)
Platelets: 329 10*3/uL (ref 150–400)
RBC: 3.1 MIL/uL — ABNORMAL LOW (ref 3.87–5.11)
RDW: 20.6 % — AB (ref 11.5–15.5)
WBC: 11 10*3/uL — ABNORMAL HIGH (ref 4.0–10.5)

## 2017-08-30 MED ORDER — INSULIN GLARGINE 100 UNIT/ML ~~LOC~~ SOLN
15.0000 [IU] | Freq: Every day | SUBCUTANEOUS | Status: DC
  Administered 2017-08-31 – 2017-09-01 (×2): 15 [IU] via SUBCUTANEOUS
  Filled 2017-08-30 (×2): qty 0.15

## 2017-08-30 NOTE — Consult Note (Signed)
[facility] Nurse ostomy follow up Stoma type/location:LUQ transverse colostomy and LLQ MF Midline abdominal dressing VAC dressing  Transverse ColostomyMod amt tan unformed stool in the pouch. Intact with good seal; pouch was changed yesterday  Mucus Fistulaapprox 1 inch, red, andbelow skin level. Deep creaseto 3:00 o'clock and 9:00 o'clock. Peristomal skinintact.Eakin pouch intact.   [facility] Nurse wound consult note Reason for Consult: NPWT (VAC) therapy was changed on night shift 08/30/17 AM.  No further needs at this time.   Wound type:surgical wound Pressure Injury POA: NA Drainage (amount, consistency, odor) moderate serosanguinous  Feculent odor.  Periwound:Colostomy and MF present Dressing procedure/placement/frequency:M/W/F VAC dressing changes.  [facility] team will follow.  Domenic Moras RN BSN [place] Pager [phone]

## 2017-08-30 NOTE — Progress Notes (Signed)
PROGRESS NOTE    Judith [name]  EXH:[id number] DOB: [birth year] DOA: 08/05/2017 PCP: System, Pcp Not In   Brief Narrative:77 y.o.female with past medical history significant for hypertension, paroxysmal atrial fibrillation, left foot osteomyelitis, right below the knee amputation, who presented to [facility] ED on 08/23/2017 due to altered mental status. She was found to be in septic shock from perforated proximal descending colon and pneumoperitoneum. She underwent left hemicolectomy with mobilization of the splenic flexure, end colostomy and mucous fistula by surgery. Post op she required continual intubation and vasopressor use and was on PCCM service. Currently she is extubated and off pressors and medical service consulted for management of medical issues.  Hospital course complicated by persistent encephalopathy, severe protein calorie malnutrition, dysphagia and severe physical debility.     Assessment & Plan:   Principal Problem:   Metabolic encephalopathy Active Problems:   Hypertension   Insulin dependent diabetes mellitus (HCC)   Hx of right BKA (HCC)   Obesity (BMI 30-39.9)   Dementia   AKI (acute kidney injury) ([place])   Encounter for hospice care discussion   Perforation of colon s/p left colectomy/ostomy/mucus fistula 08/11/2017   Acute respiratory failure with hypoxia (HCC)   S/P exploratory laparotomy   Septic shock (HCC)   Acute respiratory failure (HCC)   Atrial fibrillation (HCC)   Goals of care, counseling/discussion   DNR (do not resuscitate)   Severe protein-calorie malnutrition (HCC)   CKD (chronic kidney disease) stage 3, GFR 30-59 ml/min (HCC)  Perforation of colon s/p left colectomy/ostomy/mucus fistula 08/11/2017 -Status post exploratory laparotomy for free air, left extended hemicolectomy, colostomy and mucous fistula, Dr. Kae Heller -Patient has completed a course of IV vancomycin and Zosyn, currently wound care RN following for ostomy care -On tube feeds, at goal,  management per surgery  Active problems Acute metabolic encephalopathy -Multifactorial secondary to #1, acute illness, probable underlying dementia, severe malnutrition -Avoid sedating meds, better sleep hygiene, early ambulation  Acute blood loss anemia/iron deficiency -Likely due to #1, H&H currently stable8.4, received 2 units packed RBCs on 7/31.Monitor.  Acute respiratory failure with hypoxia -Extubated on 7/25,O2 sats100%on room air -Per goals of care DNR  Hypokalemia resolved   acute kidney injury likely due to GI losses on CKD stage III   Hypernatremia improved sodium 148  today was on D5.  Type 2 diabetes blood sugar over 200 since starting tube feeds increase the dose of Lantus.  Atrial fibrillation rate controlled on Lopressor 12.5 twice a day, Cardizem 60 mg twice a day.  Follow-by cardiologist.      DVT prophylaxis:HEPARIN Code StatusDNR Family Communication:NONE Disposition Plan:PER PRIMARY   Procedures:  Antimicrobials:NONE  Subjective:resting in bed...when I called her name loud she open her eyes and looks at me trying to talk,   Objective: Vitals:   08/29/17 1930 08/30/17 0352 08/30/17 0740 08/30/17 1453  BP: (!) 106/40 (!) 114/44  (!) 103/33  Pulse: 94 98  91  Resp:  20  (!) 23  Temp: 99.8 F (37.7 C) 97.8 F (36.6 C)  98.6 F (37 C)  TempSrc: Oral Axillary  Oral  SpO2: 99% 100%  100%  Weight:   80.2 kg (176 lb 12.9 oz)   Height:        Intake/Output Summary (Last 24 hours) at 08/30/2017 1552 Last data filed at 08/30/2017 0600 Gross per 24 hour  Intake 2000 ml  Output 2450 ml  Net -450 ml   Filed Weights   08/26/17 0500 08/27/17 0756 08/30/17 0740  Weight: 85 kg (187 lb 6.3 oz) 86.1 kg (189 lb 13.1 oz) 80.2 kg (176 lb 12.9 oz)    Examination: Foley in place General exam: Appears calm and comfortable  Respiratory system: Clear to auscultation. Respiratory effort normal. Cardiovascular system: S1 & S2 heard, RRR. No JVD,  murmurs, rubs, gallops or clicks. No pedal edema. Gastrointestinal system: Abdomen is nondistended, soft and tender. No organomegaly or masses felt. Colostomy in place with stool.vac in place. Central nervous system: Alert and oriented. No focal neurological deficits. Extremities: Symmetric 5 x 5 power. Skin: No rashes, lesions or ulcers Psychiatry: Judgement and insight appear normal. Mood & affect appropriate.     Data Reviewed: I have personally reviewed following labs and imaging studies  CBC: Recent Labs  Lab 08/25/17 1245 08/26/17 0441 08/28/17 0356 08/29/17 0358 08/30/17 0436  WBC 7.7 9.0 7.9 9.1 11.0*  HGB 9.1* 9.3* 8.3* 8.7* 8.4*  HCT 28.7* 29.7* 27.3* 28.5* 27.9*  MCV 85.9 87.6 89.5 88.8 90.0  PLT 301 336 339 342 973   Basic Metabolic Panel: Recent Labs  Lab 08/24/17 0408  08/25/17 0503 08/26/17 0441 08/27/17 0800 08/28/17 0356 08/29/17 0358 08/30/17 0436  NA 153*   < > 150* 152* 155* 151* 147* 148*  K 3.5   < > 2.9* 3.9 3.2* 3.1* 3.9 3.9  CL 127*   < > 126* 130* >130* 130* 127* 128*  CO2 19*   < > 18* 15* 15* 16* 15* 15*  GLUCOSE 225*   < > 216* 233* 202* 251* 242* 292*  BUN 42*   < > 40* 37* 36* 36* 35* 39*  CREATININE 1.56*   < > 1.49* 1.41* 1.53* 1.50* 1.46* 1.52*  CALCIUM 7.9*   < > 7.7* 7.9* 7.9* 7.9* 7.9* 7.8*  MG 1.8  --  2.2  --   --  1.8 2.5*  --   PHOS 3.5  --  3.3  --   --   --   --   --    < > = values in this interval not displayed.   GFR: Estimated Creatinine Clearance: 28.6 mL/min (A) (by C-G formula based on SCr of 1.52 mg/dL (H)). Liver Function Tests: Recent Labs  Lab 08/24/17 0408 08/25/17 0503 08/26/17 0441 08/29/17 0358 08/30/17 0436  AST 21 16 19  11* 12*  ALT 18 14 15 14 14   ALKPHOS 118 95 99 95 95  BILITOT 0.8 0.6 0.8 0.5 0.7  PROT 4.7* 4.3* 4.7* 4.9* 4.8*  ALBUMIN 1.2* 1.1* 1.3* 1.2* 1.1*   No results for input(s): LIPASE, AMYLASE in the last 168 hours. No results for input(s): AMMONIA in the last 168  hours. Coagulation Profile: No results for input(s): INR, PROTIME in the last 168 hours. Cardiac Enzymes: No results for input(s): CKTOTAL, CKMB, CKMBINDEX, TROPONINI in the last 168 hours. BNP (last 3 results) No results for input(s): PROBNP in the last 8760 hours. HbA1C: No results for input(s): HGBA1C in the last 72 hours. CBG: Recent Labs  Lab 08/29/17 1118 08/29/17 1735 08/30/17 0028 08/30/17 0523 08/30/17 1251  GLUCAP 224* 223* 225* 268* 297*   Lipid Profile: No results for input(s): CHOL, HDL, LDLCALC, TRIG, CHOLHDL, LDLDIRECT in the last 72 hours. Thyroid Function Tests: No results for input(s): TSH, T4TOTAL, FREET4, T3FREE, THYROIDAB in the last 72 hours. Anemia Panel: No results for input(s): VITAMINB12, FOLATE, FERRITIN, TIBC, IRON, RETICCTPCT in the last 72 hours. Sepsis Labs: No results for input(s): PROCALCITON, LATICACIDVEN in the last 168 hours.  No results found for this or any previous visit (from the past 240 hour(s)).       Radiology Studies: No results found.      Scheduled Meds: . chlorhexidine  15 mL Mouth Rinse BID  . diltiazem  60 mg Oral Q12H  . ferrous sulfate  300 mg Per Tube BID WC  . free water  250 mL Per Tube QID  . heparin injection (subcutaneous)  5,000 Units Subcutaneous Q8H  . insulin aspart  0-15 Units Subcutaneous Q6H  . insulin glargine  10 Units Subcutaneous Daily  . mouth rinse  15 mL Mouth Rinse q12n4p  . metoprolol tartrate  12.5 mg Oral BID  . pantoprazole sodium  40 mg Per Tube Daily  . potassium chloride  40 mEq Oral Daily   Continuous Infusions: . feeding supplement (VITAL AF 1.2 CAL) 1,500 mL (08/30/17 1059)     LOS: 20 days     Georgette Shell, MD [facility]  If 7PM-7AM, please contact night-coverage [email or web address] Password [email or web address] 08/30/2017, 3:52 PM

## 2017-08-30 NOTE — Progress Notes (Signed)
[facility] Progress Note  20 Days Post-Op  Subjective: CC:  Opens eyes to loud voice and sternal rub. Non-verbal today.    Objective: Vital signs in last 24 hours: Temp:  [97.8 F (36.6 C)-99.8 F (37.7 C)] 97.8 F (36.6 C) (08/05 0352) Pulse Rate:  [94-99] 98 (08/05 0352) Resp:  [20] 20 (08/05 0352) BP: (92-114)/(38-44) 114/44 (08/05 0352) SpO2:  [99 %-100 %] 100 % (08/05 0352) Weight:  [80.2 kg (176 lb 12.9 oz)] 80.2 kg (176 lb 12.9 oz) (08/05 0740) Last BM Date: 08/29/17  Intake/Output from previous day: 08/04 0701 - 08/05 0700 In: 2000 [NG/GT:2000] Out: 2450 [Urine:800; Drains:50; EYCXK:4818] Intake/Output this shift: No intake/output data recorded.  PE: Gen:  Alert, NAD Card:  Regular rate and rhythm Pulm:  Normal effort, clear to auscultation bilaterally Abd: Soft, non-tender, non-distended,midline wound with NPWT, ostomy and mucous fistula viable with stool in pouch Skin: warm and dry, no rashes  Neuro: opening eyes to loud voice and sternal rub, not verbalizing or following commands.  Lab Results:  Recent Labs    08/29/17 0358 08/30/17 0436  WBC 9.1 11.0*  HGB 8.7* 8.4*  HCT 28.5* 27.9*  PLT 342 329   BMET Recent Labs    08/29/17 0358 08/30/17 0436  NA 147* 148*  K 3.9 3.9  CL 127* 128*  CO2 15* 15*  GLUCOSE 242* 292*  BUN 35* 39*  CREATININE 1.46* 1.52*  CALCIUM 7.9* 7.8*   PT/INR No results for input(s): LABPROT, INR in the last 72 hours. CMP     Component Value Date/Time   NA 148 (H) 08/30/2017 0436   K 3.9 08/30/2017 0436   CL 128 (H) 08/30/2017 0436   CO2 15 (L) 08/30/2017 0436   GLUCOSE 292 (H) 08/30/2017 0436   BUN 39 (H) 08/30/2017 0436   CREATININE 1.52 (H) 08/30/2017 0436   CALCIUM 7.8 (L) 08/30/2017 0436   PROT 4.8 (L) 08/30/2017 0436   ALBUMIN 1.1 (L) 08/30/2017 0436   AST 12 (L) 08/30/2017 0436   ALT 14 08/30/2017 0436   ALKPHOS 95 08/30/2017 0436   BILITOT 0.7 08/30/2017 0436   GFRNONAA 32 (L)  08/30/2017 0436   GFRAA 37 (L) 08/30/2017 0436   Lipase     Component Value Date/Time   LIPASE 70 (H) 08/02/2017 1954       Studies/Results: No results found.  Anti-infectives: Anti-infectives (From admission, onward)   Start     Dose/Rate Route Frequency Ordered Stop   08/18/17 1200  anidulafungin (ERAXIS) 100 mg in sodium chloride 0.9 % 100 mL IVPB     100 mg 78 mL/hr over 100 Minutes Intravenous Every 24 hours 08/18/17 0907 08/20/17 1432   08/18/17 1000  piperacillin-tazobactam (ZOSYN) IVPB 3.375 g     3.375 g 12.5 mL/hr over 240 Minutes Intravenous Every 8 hours 08/18/17 0907 08/20/17 2250   08/13/17 1100  anidulafungin (ERAXIS) 100 mg in sodium chloride 0.9 % 100 mL IVPB  Status:  Discontinued     100 mg 78 mL/hr over 100 Minutes Intravenous Every 24 hours 08/12/17 1048 08/17/17 1308   08/12/17 1100  anidulafungin (ERAXIS) 200 mg in sodium chloride 0.9 % 200 mL IVPB     200 mg 78 mL/hr over 200 Minutes Intravenous  Once 08/12/17 1048 08/12/17 1648   08/11/17 0600  piperacillin-tazobactam (ZOSYN) IVPB 3.375 g     3.375 g 12.5 mL/hr over 240 Minutes Intravenous Every 8 hours 08/11/17 0025 08/17/17 2359   08/11/17 0115  vancomycin (VANCOCIN) IVPB 750 mg/150 ml premix     750 mg 150 mL/hr over 60 Minutes Intravenous Every 24 hours 08/11/17 0110 08/17/17 0211   08/11/17 0045  piperacillin-tazobactam (ZOSYN) IVPB 3.375 g     3.375 g 100 mL/hr over 30 Minutes Intravenous  Once 08/11/17 0033 08/11/17 0206   08/09/2017 2000  piperacillin-tazobactam (ZOSYN) IVPB 3.375 g  Status:  Discontinued     3.375 g 100 mL/hr over 30 Minutes Intravenous  Once 08/14/2017 1952 08/11/17 0025     Assessment/Plan s/pProcedure(s): EXPLORATORY LAPAROTOMY FOR FREE AIR, LEFT EXTENDED HEMICOLECTOMY, COLOSTOMY AND MUCUS FISTULA (N/A)  Perforated proximal descending colon Extended left hemicolectomy with distal transverse colostomy and mucous fistula creation, 08/11/17,Dr Kae Heller. POD#17 -TFs  atgoal rate and tolerating well with corpak tube - think it will help with swallow eval and trial -BID midline dressing changesand prn for saturation from serous drainage -WOC RN for ostomy care, currently using small eakin pouch to bedside drainage. -WBCnormal, antibiotic course complete.  Sepsis- resolved, secondary to #1,patientoff pressors,abx therapydiscontinued7/26 Thrombocytopenia- resolved VDRF- extubated 6:30 PM 7/25 DM- Lantus, SSI  HTN- resolved AKI on CKD stage III- Cr 1.52 Hypokalemia -resolved Hypomag - resolved Hypernatremia- improving, continue to monitor Severe malnutrition - tube feeds/Speech evaluation for swallowing ABL anemia- H/H 8.4/27 s/p transfusion 2u pRBC 7/31 Paroxysmal A.Fib- cards following, diltiazem and beta blocker  Hx of R BKA secondary to peripheral vascular disease- L toes dark, good doppler pulse, continue current care Acute metabolic encephalopathy- multifactoral, followed simple commands this AM.  Most telling part of her prognosis  Goals of care- palliative following and greatly appreciate their support; PMT meetingtentativelyscheduled for 8/7 at 3:00 pm. Pt remains DNR, No PEG, No invasive procedures.Anticipate D/C to SNF for long term care with Hospice at some point.  FEN -cortrak w TF @ goal, SLP following VTE - SCDs,Heparinpreviouslyheld for low platelets/anemia, restart subcu heparin 8/2 ID -Zosyn 7/16-->Vanc 7/16-->Eraxis 7/18 -->all abx stopped 08/17/17.(Calcitonin 32) Zosyn/Eraxis restarted 7/24>>7/26  Dispo: palliative meeting 8/7, possible advancement of diet per speech.     LOS: 20 days    Jill Alexanders , [facility] 08/30/2017, 10:08 AM Pager: [phone] Consults: [phone] Mon-Fri 7:00 am-4:30 pm Sat-Sun 7:00 am-11:30 am

## 2017-08-30 NOTE — Progress Notes (Signed)
Inpatient Diabetes Program Recommendations  AACE/ADA: New Consensus Statement on Inpatient Glycemic Control (2015)  Target Ranges:  Prepandial:   less than 140 mg/dL      Peak postprandial:   less than 180 mg/dL (1-2 hours)      Critically ill patients:  140 - 180 mg/dL   Results for Judith [name], Judith [name] (MRN [id number]) as of 08/30/2017 15:20  Ref. Range 08/29/2017 00:06 08/29/2017 05:38 08/29/2017 11:18 08/29/2017 17:35 08/30/2017 00:28 08/30/2017 05:23 08/30/2017 12:51  Glucose-Capillary Latest Ref Range: 70 - 99 mg/dL 259 (H) 257 (H) 224 (H) 223 (H) 225 (H) 268 (H) 297 (H)   Review of Glycemic Control  Inpatient Diabetes Program Recommendations:    Glucose consistently in the 200's. Currently patient is on: Lantus 10 units Daily, Novolog 0-15 units Q6 hours  Patient is on Tube Feeds. Consider Novolog 4 units Q6 hours for tube feed coverage in addition to correction scale. Do not give if Tube feeds are stopped or held for any reason.  Thanks,  Tama Headings RN, MSN, BC-ADM Inpatient Diabetes Coordinator Team Pager [phone] (8a-5p)

## 2017-08-31 DIAGNOSIS — F0280 Dementia in other diseases classified elsewhere without behavioral disturbance: Secondary | ICD-10-CM

## 2017-08-31 DIAGNOSIS — G9341 Metabolic encephalopathy: Secondary | ICD-10-CM

## 2017-08-31 LAB — CBC
HEMATOCRIT: 26.2 % — AB (ref 36.0–46.0)
Hemoglobin: 7.9 g/dL — ABNORMAL LOW (ref 12.0–15.0)
MCH: 27.1 pg (ref 26.0–34.0)
MCHC: 30.2 g/dL (ref 30.0–36.0)
MCV: 90 fL (ref 78.0–100.0)
PLATELETS: 328 10*3/uL (ref 150–400)
RBC: 2.91 MIL/uL — ABNORMAL LOW (ref 3.87–5.11)
RDW: 20.7 % — ABNORMAL HIGH (ref 11.5–15.5)
WBC: 10.7 10*3/uL — ABNORMAL HIGH (ref 4.0–10.5)

## 2017-08-31 LAB — COMPREHENSIVE METABOLIC PANEL
ALT: 15 U/L (ref 0–44)
AST: 14 U/L — ABNORMAL LOW (ref 15–41)
Albumin: 1.2 g/dL — ABNORMAL LOW (ref 3.5–5.0)
Alkaline Phosphatase: 111 U/L (ref 38–126)
Anion gap: 9 (ref 5–15)
BILIRUBIN TOTAL: 0.6 mg/dL (ref 0.3–1.2)
BUN: 39 mg/dL — AB (ref 8–23)
CALCIUM: 8.1 mg/dL — AB (ref 8.9–10.3)
CHLORIDE: 125 mmol/L — AB (ref 98–111)
CO2: 13 mmol/L — ABNORMAL LOW (ref 22–32)
Creatinine, Ser: 1.5 mg/dL — ABNORMAL HIGH (ref 0.44–1.00)
GFR, EST AFRICAN AMERICAN: 37 mL/min — AB (ref >60)
GFR, EST NON AFRICAN AMERICAN: 32 mL/min — AB (ref >60)
Glucose, Bld: 254 mg/dL — ABNORMAL HIGH (ref 70–99)
Potassium: 3.8 mmol/L (ref 3.5–5.1)
Sodium: 147 mmol/L — ABNORMAL HIGH (ref 135–145)
TOTAL PROTEIN: 4.7 g/dL — AB (ref 6.5–8.1)

## 2017-08-31 LAB — GLUCOSE, CAPILLARY
GLUCOSE-CAPILLARY: 232 mg/dL — AB (ref 70–99)
GLUCOSE-CAPILLARY: 271 mg/dL — AB (ref 70–99)
Glucose-Capillary: 310 mg/dL — ABNORMAL HIGH (ref 70–99)
Glucose-Capillary: 277 mg/dL — ABNORMAL HIGH (ref 70–99)

## 2017-08-31 MED ORDER — RESOURCE THICKENUP CLEAR PO POWD
ORAL | Status: DC | PRN
  Filled 2017-08-31: qty 125

## 2017-08-31 NOTE — Plan of Care (Signed)
  Problem: Nutrition: Goal: Adequate nutrition will be maintained Outcome: Progressing   Problem: Skin Integrity: Goal: Risk for impaired skin integrity will decrease Outcome: Progressing   

## 2017-08-31 NOTE — Progress Notes (Signed)
Daily Progress Note   Patient Name: Judith [name]       Date: 08/31/2017 DOB: 12-04-1940  Age: 77 y.o. MRN#: [id number] Attending Physician: Nolon Nations, MD Primary Care Physician: System, Pcp Not In Admit Date: 08/01/2017  Reason for Consultation/Follow-up: Establishing goals of care and Psychosocial/spiritual support  Subjective: Patient awake and alert.  Very interested in tiny bites of applesauce and indicates that she wants more.    Lab work indicates she is becoming more acidotic.  CO2 level 13.  CBGs in the 200s.  Stool output between 2500 - 4200 ml daily.  Called [facility].  Family meeting is still scheduled for tomorrow.  Family is optimistic.  [name] has attempted to temper their expectations "this may be as good as it gets".     Assessment: 77 yo female with dementia.  Underwent surgery 7/17 for perforated bowel.  Recovered from septic shock and was successfully liberated from the vent.  Currently with N/G in place, still NPO in mitts.  Stool output high,  CO2 level high.   Patient is stable with artificial nutrition and hydration.  I'm concerned that she would fail without it.     Patient Profile/HPI:  77 y.o. female  with past medical history of IDDM, charcot's joint, dementia, anxiety, proctocolitis, PUD, right BKA, asthma, and arthritis who was admitted on 08/05/2017 with septic shock.  She was found to have a bowel perforation and was taken to the OR.  She received a hemicolectomy with colostomy and mucous fistula.   Length of Stay: 21  Current Medications: Scheduled Meds:  . chlorhexidine  15 mL Mouth Rinse BID  . diltiazem  60 mg Oral Q12H  . ferrous sulfate  300 mg Per Tube BID WC  . free water  250 mL Per Tube QID  . heparin injection (subcutaneous)  5,000 Units Subcutaneous  Q8H  . insulin aspart  0-15 Units Subcutaneous Q6H  . insulin glargine  15 Units Subcutaneous Daily  . mouth rinse  15 mL Mouth Rinse q12n4p  . metoprolol tartrate  12.5 mg Oral BID  . pantoprazole sodium  40 mg Per Tube Daily  . potassium chloride  40 mEq Oral Daily    Continuous Infusions: . feeding supplement (VITAL AF 1.2 CAL) 1,500 mL (08/30/17 1059)    PRN Meds: acetaminophen (TYLENOL) oral  liquid 160 mg/5 mL, albuterol, hydroxypropyl methylcellulose / hypromellose, midazolam, sodium chloride, sodium chloride flush  Physical Exam        Elderly female, pleasantly demented.  Cor trak in place, mitts on hands, attempted to pull cor trak CV irreg resp no distress Lower extremity not examined.    Vital Signs: BP 124/78 (BP Location: Left Arm)   Pulse 78   Temp 98.2 F (36.8 C) (Oral)   Resp 20   Ht 5' (1.524 m)   Wt 81.9 kg (180 lb 8.9 oz)   SpO2 96%   BMI 35.26 kg/m  SpO2: SpO2: 96 % O2 Device: O2 Device: Room Air O2 Flow Rate: O2 Flow Rate (L/min): 0 L/min  Intake/output summary:   Intake/Output Summary (Last 24 hours) at 08/31/2017 1012 Last data filed at 08/31/2017 0507 Gross per 24 hour  Intake 10 ml  Output 3050 ml  Net -3040 ml   LBM: Last BM Date: 08/29/17 Baseline Weight: Weight: 86.2 kg (190 lb 0.6 oz) Most recent weight: Weight: 81.9 kg (180 lb 8.9 oz)       Palliative Assessment/Data:  20%    Flowsheet Rows     Most Recent Value  Intake Tab  Referral Department  Hospitalist  Unit at Time of Referral  ICU  Palliative Care Primary Diagnosis  Other (Comment) [Perforated viscus]  Date Notified  08/16/17  Palliative Care Type  Return patient Palliative Care  Reason for referral  Clarify Goals of Care  Date of Admission  08/24/2017  Date first seen by Palliative Care  08/17/17  # of days Palliative referral response time  1 Day(s)  # of days IP prior to Palliative referral  6  Clinical Assessment  Psychosocial & Spiritual Assessment  Palliative  Care Outcomes      Patient Active Problem List   Diagnosis Date Noted  . Metabolic encephalopathy 07/19/7626  . CKD (chronic kidney disease) stage 3, GFR 30-59 ml/min (HCC) 08/29/2017  . Severe protein-calorie malnutrition ([place])   . Atrial fibrillation ([place])   . Goals of care, counseling/discussion   . DNR (do not resuscitate)   . Acute respiratory failure ([place])   . Acute respiratory failure with hypoxia ([place])   . S/P exploratory laparotomy   . Septic shock ([place])   . Perforation of colon s/p left colectomy/ostomy/mucus fistula 08/11/2017 08/17/2017  . Encounter for hospice care discussion   . Palliative care encounter   . Acute UTI   . AKI (acute kidney injury) ([place])   . Osteomyelitis of left foot ([place])   . UTI (urinary tract infection) 04/23/2017  . Osteomyelitis due to type 2 diabetes mellitus ([place]) 04/23/2017  . Uterine hypertrophy 06/11/2015  . Kidney lesion, native, bilateral 06/11/2015  . Dementia 09/21/2013  . Constipation 09/21/2013  . Rectal bleeding 09/21/2013  . Fecal impaction ([place]) 09/21/2013  . BRBPR (bright red blood per rectum) 09/21/2013  . Altered mental status 05/17/2013  . Altered mental state 05/17/2013  . Delirium due to another medical condition 02/18/2013  . Acute blood loss anemia 03/03/2012  . Hx of right BKA ([place]) 03/03/2012  . Fall 03/03/2012  . Obesity (BMI 30-39.9) 03/03/2012  . Poor nutrition 03/03/2012  . Fracture of femur, distal, right, closed ([place]) 03/01/2012  . Lower urinary tract infectious disease 03/01/2012  . Insulin dependent diabetes mellitus ([place]) 03/01/2012  . Hypertension     Palliative Care Plan    Recommendations/Plan:  Will discuss with primary team and internal medicine for their recommendations re: goals.  I am concerned that without artificial hydration and nutrition she will fail.  Therefore if she is likely not to improve further she is appropriate for hospice house.  If she has an opportunity to improve she  could return to SNF with Hospice.  PMT meeting scheduled with family for 8/7 at 3:00 pm.  Goals of Care and Additional Recommendations:  Limitations on Scope of Treatment: Full Scope Treatment  Code Status:  DNR/DNI  Prognosis:   < 2 weeks if patient is unable to support her hydration / nutritional needs she is appropriate for hospice house.     Discharge Planning:  To Be Determined  Care plan was discussed with [facility].   Contacting [facility] and [facility]  Thank you for allowing the Palliative Medicine Team to assist in the care of this patient.  Total time spent:  35 min.     Greater than 50%  of this time was spent counseling and coordinating care related to the above assessment and plan.  [name], PA-C Palliative Medicine  Please contact Palliative MedicineTeam phone at [phone] for questions and concerns between 7 am - 7 pm.   Please see AMION for individual provider pager numbers.

## 2017-08-31 NOTE — Social Work (Signed)
CSW continuing to follow, pt unable to return to SNF with coretrak and mitts at current time, will need both discontinued for medical stability to discharge to SNF.   Alexander Mt, [place] Work [phone]

## 2017-08-31 NOTE — Progress Notes (Signed)
Inpatient Diabetes Program Recommendations  AACE/ADA: New Consensus Statement on Inpatient Glycemic Control (2015)  Target Ranges:  Prepandial:   less than 140 mg/dL      Peak postprandial:   less than 180 mg/dL (1-2 hours)      Critically ill patients:  140 - 180 mg/dL   Results for KATRIONA, SCHMIERER (MRN [id number]) as of 08/31/2017 07:54  Ref. Range 08/30/2017 00:28 08/30/2017 05:23 08/30/2017 12:51 08/30/2017 18:13  Glucose-Capillary Latest Ref Range: 70 - 99 mg/dL 225 (H) 268 (H) 297 (H) 295 (H)   Results for ANISA, LEANOS (MRN [id number]) as of 08/31/2017 07:54  Ref. Range 08/31/2017 00:42 08/31/2017 06:39  Glucose-Capillary Latest Ref Range: 70 - 99 mg/dL 271 (H) 232 (H)    Home DM Meds: Levemir 15 units BID       Novolog 0-15 units TID per SSI  Current Orders: Lantus 15 units daily      Novolog Moderate Correction Scale/ SSI (0-15 units) Q6 hours     Getting Vital AF tube feeds at 60cc/hr.  CBGs consistently >200 mg/dl.  Note that Lantus increased to 15 units daily this AM.      MD- Please also consider the following:  1. Change Novolog SSI to Q4 hour coverage (currently ordered Q6 hours)  2. Start Novolog tube feed coverage: Novolog 3 units Q4 hours (Hold if tube feeds held for any reason)     --Will follow patient during hospitalization--  Wyn Quaker RN, MSN, CDE Diabetes Coordinator Inpatient Glycemic Control Team Team Pager: [phone] (8a-5p)

## 2017-08-31 NOTE — Progress Notes (Signed)
[facility] Progress Note  21 Days Post-Op  Subjective: CC: no complaints Patient opens her eyes and tracks me but does not verbalize at all. Follows some simple commands. No family present at bedside.   Objective: Vital signs in last 24 hours: Temp:  [98.2 F (36.8 C)-99.1 F (37.3 C)] 98.2 F (36.8 C) (08/06 0536) Pulse Rate:  [78-104] 78 (08/06 0536) Resp:  [20-23] 20 (08/06 0536) BP: (103-124)/(33-78) 124/78 (08/06 0536) SpO2:  [96 %-100 %] 96 % (08/06 0536) Weight:  [81.9 kg (180 lb 8.9 oz)] 81.9 kg (180 lb 8.9 oz) (08/06 0500) Last BM Date: 08/29/17  Intake/Output from previous day: 08/05 0701 - 08/06 0700 In: 10 [I.V.:10] Out: 3050 [Urine:400; Stool:2650] Intake/Output this shift: No intake/output data recorded.  PE: Gen:  Alert, NAD Card:  Regular rate and rhythm Pulm:  Normal effort, clear to auscultation bilaterally Abd: Soft, non-tender, non-distended,midline wound with NPWT, ostomy and mucous fistula viable with stool in pouch Skin: warm and dry, no rashes  Ext: toes dark Neuro: opening eyes to voice, no verbal response, following some simple commands.    Lab Results:  Recent Labs    08/30/17 0436 08/31/17 0457  WBC 11.0* 10.7*  HGB 8.4* 7.9*  HCT 27.9* 26.2*  PLT 329 328   BMET Recent Labs    08/30/17 0436 08/31/17 0457  NA 148* 147*  K 3.9 3.8  CL 128* 125*  CO2 15* 13*  GLUCOSE 292* 254*  BUN 39* 39*  CREATININE 1.52* 1.50*  CALCIUM 7.8* 8.1*   PT/INR No results for input(s): LABPROT, INR in the last 72 hours. CMP     Component Value Date/Time   NA 147 (H) 08/31/2017 0457   K 3.8 08/31/2017 0457   CL 125 (H) 08/31/2017 0457   CO2 13 (L) 08/31/2017 0457   GLUCOSE 254 (H) 08/31/2017 0457   BUN 39 (H) 08/31/2017 0457   CREATININE 1.50 (H) 08/31/2017 0457   CALCIUM 8.1 (L) 08/31/2017 0457   PROT 4.7 (L) 08/31/2017 0457   ALBUMIN 1.2 (L) 08/31/2017 0457   AST 14 (L) 08/31/2017 0457   ALT 15 08/31/2017 0457   ALKPHOS 111 08/31/2017 0457   BILITOT 0.6 08/31/2017 0457   GFRNONAA 32 (L) 08/31/2017 0457   GFRAA 37 (L) 08/31/2017 0457   Lipase     Component Value Date/Time   LIPASE 70 (H) 08/25/2017 1954       Studies/Results: No results found.  Anti-infectives: Anti-infectives (From admission, onward)   Start     Dose/Rate Route Frequency Ordered Stop   08/18/17 1200  anidulafungin (ERAXIS) 100 mg in sodium chloride 0.9 % 100 mL IVPB     100 mg 78 mL/hr over 100 Minutes Intravenous Every 24 hours 08/18/17 0907 08/20/17 1432   08/18/17 1000  piperacillin-tazobactam (ZOSYN) IVPB 3.375 g     3.375 g 12.5 mL/hr over 240 Minutes Intravenous Every 8 hours 08/18/17 0907 08/20/17 2250   08/13/17 1100  anidulafungin (ERAXIS) 100 mg in sodium chloride 0.9 % 100 mL IVPB  Status:  Discontinued     100 mg 78 mL/hr over 100 Minutes Intravenous Every 24 hours 08/12/17 1048 08/17/17 1308   08/12/17 1100  anidulafungin (ERAXIS) 200 mg in sodium chloride 0.9 % 200 mL IVPB     200 mg 78 mL/hr over 200 Minutes Intravenous  Once 08/12/17 1048 08/12/17 1648   08/11/17 0600  piperacillin-tazobactam (ZOSYN) IVPB 3.375 g     3.375 g 12.5 mL/hr over 240 Minutes  Intravenous Every 8 hours 08/11/17 0025 08/17/17 2359   08/11/17 0115  vancomycin (VANCOCIN) IVPB 750 mg/150 ml premix     750 mg 150 mL/hr over 60 Minutes Intravenous Every 24 hours 08/11/17 0110 08/17/17 0211   08/11/17 0045  piperacillin-tazobactam (ZOSYN) IVPB 3.375 g     3.375 g 100 mL/hr over 30 Minutes Intravenous  Once 08/11/17 0033 08/11/17 0206   08/24/2017 2000  piperacillin-tazobactam (ZOSYN) IVPB 3.375 g  Status:  Discontinued     3.375 g 100 mL/hr over 30 Minutes Intravenous  Once 08/01/2017 1952 08/11/17 0025       Assessment/Plan s/pProcedure(s): EXPLORATORY LAPAROTOMY FOR FREE AIR, LEFT EXTENDED HEMICOLECTOMY, COLOSTOMY AND MUCUS FISTULA (N/A)  Perforated proximal descending colon Extended left hemicolectomy with distal  transverse colostomy and mucous fistula creation, 08/11/17,Dr Kae Heller. POD#20 -TFs atgoal rate and tolerating well with corpak tube - think it will help with swallow eval and trial -BID midline dressing changesand prn for saturation from serous drainage -WOC RN for ostomy care, currently using small eakin pouch to bedside drainage. -WBCnormal, antibiotic course complete.  Sepsis- resolved, secondary to #1,patientoff pressors,abx therapydiscontinued7/26 Thrombocytopenia- resolved VDRF- extubated 6:30 PM 7/25 DM- Lantus, SSI  HTN- resolved AKI on CKD stage III- Cr 1.50 Hypokalemia -resolved Hypomag- resolved Hypernatremia- improving, continue to monitor Severe malnutrition - tube feeds/Speech evaluation for swallowing ABL anemia- H/H 7.9/26.2 s/p transfusion 2u pRBC 7/31 Paroxysmal A.Fib- cards following, diltiazem and beta blocker  Hx of R BKA secondary to peripheral vascular disease- L toes dark, good doppler pulse, continue current care Acute metabolic encephalopathy- multifactoral, followed simple commands this AM. Most telling part of her prognosis  Goals of care- palliative following and greatly appreciate their support; PMT meetingtentativelyscheduled for 8/7 at 3:00 pm. Pt remains DNR, No PEG, No invasive procedures.Anticipate D/C to SNF for long term care with Hospice at some point.  FEN -cortrak w TF @ goal, SLP following VTE - SCDs,Heparinpreviouslyheld for low platelets/anemia, restart subcu heparin 8/2 ID -Zosyn 7/16-->Vanc 7/16-->Eraxis 7/18 -->all abx stopped 08/17/17.(Calcitonin 32) Zosyn/Eraxis restarted 7/24>>7/26  Dispo: palliative meeting 8/7, possible advancement of diet per speech.     LOS: 21 days    [name] , [facility] 08/31/2017, 10:27 AM Pager: [phone] Consults: [phone] Mon-Fri 7:00 am-4:30 pm Sat-Sun 7:00 am-11:30 am

## 2017-08-31 NOTE — Progress Notes (Signed)
PROGRESS NOTE    Judith [name]  RXV:[id number] DOB: 1940-06-17 DOA: 08/12/2017 PCP: System, Pcp Not In  Brief Narrative:77 y.o.female with past medical history significant for hypertension, paroxysmal atrial fibrillation, left foot osteomyelitis, right below the knee amputation, who presented to [facility] ED on 07/29/2017 due to altered mental status. She was found to be in septic shock from perforated proximal descending colon and pneumoperitoneum. She underwent left hemicolectomy with mobilization of the splenic flexure, end colostomy and mucous fistula by surgery. Post op she required continual intubation and vasopressor use and was on PCCM service. Currently she is extubated and off pressors and medical service consulted for management of medical issues.  Hospital course complicated by persistent encephalopathy, severe protein calorie malnutrition, dysphagia and severe physical debility.  08/31/2017 overall patient has not made any significant improvement in the last few days.  She is fed through cotrak tube.  She has not gotten out of bed or made any meaningful recovery so far.  Her albumin is 1.2.  Her prognosis is still guarded.  Palliative care to meet with patient's family tomorrow 03-Sep-2017.  Assessment & Plan:   Principal Problem:   Metabolic encephalopathy Active Problems:   Hypertension   Insulin dependent diabetes mellitus (HCC)   Hx of right BKA (HCC)   Obesity (BMI 30-39.9)   Dementia   AKI (acute kidney injury) ([place])   Encounter for hospice care discussion   Perforation of colon s/p left colectomy/ostomy/mucus fistula 08/11/2017   Acute respiratory failure with hypoxia (HCC)   S/P exploratory laparotomy   Septic shock (HCC)   Acute respiratory failure (HCC)   Atrial fibrillation (HCC)   Goals of care, counseling/discussion   DNR (do not resuscitate)   Severe protein-calorie malnutrition (HCC)   CKD (chronic kidney disease) stage 3, GFR 30-59 ml/min (HCC)   Perforation of  colon s/p left colectomy/ostomy/mucus fistula 08/11/2017 -Status post exploratory laparotomy for free air, left extended hemicolectomy, colostomy and mucous fistula, Dr. Kae Heller -Patient has completed a course of IV vancomycin and Zosyn, currently wound care RN following for ostomy care -On tube feeds, at goal, management per surgery  Active problems Acute metabolic encephalopathy -Multifactorial secondary to #1, acute illness, probable underlying dementia, severe malnutrition -Avoid sedating meds, better sleep hygiene, early ambulation  Acute blood loss anemia/iron deficiency status post 2 units of packed RBCs.  Acute respiratory failure with hypoxia -Extubated on 7/25,O2 sats100%on room air -Per goals of care DNR  Hypokalemia resolved acute kidney injury on CKD stage III/hyperchloremic non-gap acidosis due to ongoing GI loss.   Hypernatremia improved sodium 148  today was on D5.  Type 2 diabetes blood sugar over 200 since starting tube feeds increase the dose of Lantus.  Start NovoLog 4 units every 6 hours for tube feed coverage.  Atrial fibrillation rate controlled on Lopressor 12.5 twice a day, Cardizem 60 mg twice a day. Follow-by cardiologist.        DVT prophylaxis: Heparin Code Status: DO NOT RESUSCITATE Family Communication no family in the room Disposition Plan: Per primary team    Procedures: Exploratory laparotomy hemicolectomy and colostomy Antimicrobials: None Subjective: She is resting in bed when I called her name she did not open her eyes and looked at me. Objective: Vitals:   08/30/17 1453 08/30/17 2133 08/31/17 0500 08/31/17 0536  BP: (!) 103/33 (!) 110/44  124/78  Pulse: 91 (!) 104  78  Resp: (!) 23 20  20   Temp: 98.6 F (37 C) 99.1 F (37.3 C)  98.2  F (36.8 C)  TempSrc: Oral Oral  Oral  SpO2: 100% 100%  96%  Weight:   81.9 kg (180 lb 8.9 oz)   Height:        Intake/Output Summary (Last 24 hours) at 08/31/2017 1137 Last data  filed at 08/31/2017 1117 Gross per 24 hour  Intake 10 ml  Output 3050 ml  Net -3040 ml   Filed Weights   08/27/17 0756 08/30/17 0740 08/31/17 0500  Weight: 86.1 kg (189 lb 13.1 oz) 80.2 kg (176 lb 12.9 oz) 81.9 kg (180 lb 8.9 oz)    Examination:  General exam: Appears calm and comfortable  Respiratory system: Clear to auscultation. Respiratory effort normal. Cardiovascular system: S1 & S2 heard, RRR. No JVD, murmurs, rubs, gallops or clicks. No pedal edema. Gastrointestinal system: Abdomen is nondistended, soft and nontender. No organomegaly or masses felt. Normal bowel sounds heard.  Colostomy bag in place.  Foley catheter in place. Central nervous system: Not responding to commands or answering questions. Extremities: Symmetric 5 x 5 power. Skin: No rashes, lesions or ulcers Psychiatry: Judgement and insight appear normal. Mood & affect appropriate.     Data Reviewed: I have personally reviewed following labs and imaging studies  CBC: Recent Labs  Lab 08/26/17 0441 08/28/17 0356 08/29/17 0358 08/30/17 0436 08/31/17 0457  WBC 9.0 7.9 9.1 11.0* 10.7*  HGB 9.3* 8.3* 8.7* 8.4* 7.9*  HCT 29.7* 27.3* 28.5* 27.9* 26.2*  MCV 87.6 89.5 88.8 90.0 90.0  PLT 336 339 342 329 119   Basic Metabolic Panel: Recent Labs  Lab 08/25/17 0503  08/27/17 0800 08/28/17 0356 08/29/17 0358 08/30/17 0436 08/31/17 0457  NA 150*   < > 155* 151* 147* 148* 147*  K 2.9*   < > 3.2* 3.1* 3.9 3.9 3.8  CL 126*   < > >130* 130* 127* 128* 125*  CO2 18*   < > 15* 16* 15* 15* 13*  GLUCOSE 216*   < > 202* 251* 242* 292* 254*  BUN 40*   < > 36* 36* 35* 39* 39*  CREATININE 1.49*   < > 1.53* 1.50* 1.46* 1.52* 1.50*  CALCIUM 7.7*   < > 7.9* 7.9* 7.9* 7.8* 8.1*  MG 2.2  --   --  1.8 2.5*  --   --   PHOS 3.3  --   --   --   --   --   --    < > = values in this interval not displayed.   GFR: Estimated Creatinine Clearance: 29.3 mL/min (A) (by C-G formula based on SCr of 1.5 mg/dL (H)). Liver Function  Tests: Recent Labs  Lab 08/25/17 0503 08/26/17 0441 08/29/17 0358 08/30/17 0436 08/31/17 0457  AST 16 19 11* 12* 14*  ALT 14 15 14 14 15   ALKPHOS 95 99 95 95 111  BILITOT 0.6 0.8 0.5 0.7 0.6  PROT 4.3* 4.7* 4.9* 4.8* 4.7*  ALBUMIN 1.1* 1.3* 1.2* 1.1* 1.2*   No results for input(s): LIPASE, AMYLASE in the last 168 hours. No results for input(s): AMMONIA in the last 168 hours. Coagulation Profile: No results for input(s): INR, PROTIME in the last 168 hours. Cardiac Enzymes: No results for input(s): CKTOTAL, CKMB, CKMBINDEX, TROPONINI in the last 168 hours. BNP (last 3 results) No results for input(s): PROBNP in the last 8760 hours. HbA1C: No results for input(s): HGBA1C in the last 72 hours. CBG: Recent Labs  Lab 08/30/17 0523 08/30/17 1251 08/30/17 1813 08/31/17 0042 08/31/17 0639  GLUCAP 268*  297* 295* 271* 232*   Lipid Profile: No results for input(s): CHOL, HDL, LDLCALC, TRIG, CHOLHDL, LDLDIRECT in the last 72 hours. Thyroid Function Tests: No results for input(s): TSH, T4TOTAL, FREET4, T3FREE, THYROIDAB in the last 72 hours. Anemia Panel: No results for input(s): VITAMINB12, FOLATE, FERRITIN, TIBC, IRON, RETICCTPCT in the last 72 hours. Sepsis Labs: No results for input(s): PROCALCITON, LATICACIDVEN in the last 168 hours.  No results found for this or any previous visit (from the past 240 hour(s)).       Radiology Studies: No results found.      Scheduled Meds: . chlorhexidine  15 mL Mouth Rinse BID  . diltiazem  60 mg Oral Q12H  . ferrous sulfate  300 mg Per Tube BID WC  . free water  250 mL Per Tube QID  . heparin injection (subcutaneous)  5,000 Units Subcutaneous Q8H  . insulin aspart  0-15 Units Subcutaneous Q6H  . insulin glargine  15 Units Subcutaneous Daily  . mouth rinse  15 mL Mouth Rinse q12n4p  . metoprolol tartrate  12.5 mg Oral BID  . pantoprazole sodium  40 mg Per Tube Daily  . potassium chloride  40 mEq Oral Daily   Continuous  Infusions: . feeding supplement (VITAL AF 1.2 CAL) 1,500 mL (08/30/17 1059)     LOS: 21 days     Georgette Shell, MD [facility] If 7PM-7AM, please contact night-coverage [email or web address] Password [facility] 08/31/2017, 11:37 AM

## 2017-08-31 NOTE — Plan of Care (Signed)
  Problem: Nutrition: Goal: Adequate nutrition will be maintained Outcome: Progressing   

## 2017-08-31 NOTE — Evaluation (Signed)
Clinical/Bedside Swallow Evaluation Patient Details  Name: Judith [name] MRN: [id number] Date of Birth: 05/24/1940  Today's Date: 08/31/2017 Time: SLP Start Time (ACUTE ONLY): 1140 SLP Stop Time (ACUTE ONLY): 1200 SLP Time Calculation (min) (ACUTE ONLY): 20 min  Past Medical History:  Past Medical History:  Diagnosis Date  . Anxiety   . Arthritis    "left leg" (05/17/2013)  . Asthma   . 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 Closed right ankle fracture   . Colitis 09/06/02  . Diabetes mellitus without complication ([place])   . Fracture of femur, distal, right, closed ([place]) 03/01/2012  . GERD (gastroesophageal reflux disease)   . NWGNFAOZ(308.6)    "probably weekly" (05/17/2013)  . Hemorrhoid   . High cholesterol   . History of stomach ulcers   . Hyperlipidemia   . Hypertension   . Insulin dependent diabetes mellitus ([place]) 03/01/2012  . Obesity (BMI 30-39.9) 03/03/2012  . Perforated viscus 08/24/2017  . Pneumonia    "once" (05/17/2013)  . Proctocolitis 09/04/02   Past Surgical History:  Past Surgical History:  Procedure Laterality Date  . ABDOMINAL HYSTERECTOMY    . BELOW KNEE LEG AMPUTATION Right 02/2008  . BREAST LUMPECTOMY Bilateral    "not cancer"  . CATARACT EXTRACTION W/ INTRAOCULAR LENS  IMPLANT, BILATERAL Bilateral   . LAPAROTOMY N/A 08/03/2017   Procedure: EXPLORATORY LAPAROTOMY FOR FREE AIR, LEFT EXTENDED HEMICOLECTOMY, COLOSTOMY AND MUCUS FISTULA;  Surgeon: Clovis Riley, MD;  Location: [facility];  Service: General;  Laterality: N/A;  . ORIF FEMUR FRACTURE  03/01/2012   Procedure: OPEN REDUCTION INTERNAL FIXATION (ORIF) DISTAL FEMUR FRACTURE;  Surgeon: Rozanna Box, MD;  Location: [facility];  Service: Orthopedics;  Laterality: Right;  ORIF right femur   HPI:  77 y.o. female  with past medical history of IDDM, charcot's joint, dementia, anxiety, proctocolitis, PUD, right BKA, asthma, and arthritis who was admitted on 08/07/2017 with septic shock.  She was found to have a  bowel perforation and was taken to the OR.  She received a hemicolectomy with colostomy and mucous fistula. Intubated 08/11/17 with one-way extubation on 08/19/17. Per palliative, prognosis <2 weeks in setting of hypotensive with Afib, severely deconditioned, malnourished, demented, with a large abdominal wound. No tracheostomy, no PEG. Pt was seen by SLP but lethargy was a barrier to diet initiation. Orders discontinued over the weekend and Palliative NP reordered SLP to initiate diet in setting of improved arousal.    Assessment / Plan / Recommendation Clinical Impression  Pt demonstrates significant improvement in arousal since prior assessment. Pts SLP orders were discontinued on 8/3 by RN for some reason. Today she is alert, requires visual and tactile cues to improve awareness of method of intake (cup straw, spoon). She was able to accept boluses via all means, but immediate coughing observed with thin liquids and nectar if the bolus was large (large cup sip or straw sip). Oral holding observed in prior MBS much improved. Recommend pt initiate a dys 1/nectar thick diet with full supervision, no straws. Could consider removing NG tube if pt consumes PO consistently, tube feeding may need to stop to allow for hunger cues. Will follow for tolerance.  SLP Visit Diagnosis: Dysphagia, oropharyngeal phase (R13.12)    Aspiration Risk  Moderate aspiration risk    Diet Recommendation Dysphagia 1 (Puree);Nectar-thick liquid   Liquid Administration via: Cup;No straw Medication Administration: Crushed with puree Supervision: Full supervision/cueing for compensatory strategies Compensations: Slow rate;Small sips/bites Postural Changes:  Seated upright at 90 degrees    Other  Recommendations Oral Care Recommendations: Oral care BID   Follow up Recommendations Skilled Nursing facility      Frequency and Duration min 2x/week  2 weeks       Prognosis Prognosis for Safe Diet Advancement: Good       Swallow Study   General HPI: 77 y.o. female  with past medical history of IDDM, charcot's joint, dementia, anxiety, proctocolitis, PUD, right BKA, asthma, and arthritis who was admitted on 08/11/2017 with septic shock.  She was found to have a bowel perforation and was taken to the OR.  She received a hemicolectomy with colostomy and mucous fistula. Intubated 08/11/17 with one-way extubation on 08/19/17. Per palliative, prognosis <2 weeks in setting of hypotensive with Afib, severely deconditioned, malnourished, demented, with a large abdominal wound. No tracheostomy, no PEG. Pt was seen by SLP but lethargy was a barrier to diet initiation. Orders discontinued over the weekend and Palliative NP reordered SLP to initiate diet in setting of improved arousal.  Type of Study: Bedside Swallow Evaluation Previous Swallow Assessment: see HPI Diet Prior to this Study: NPO;NG Tube Temperature Spikes Noted: No Respiratory Status: Nasal cannula History of Recent Intubation: Yes Length of Intubations (days): 7 days Date extubated: 08/19/17 Behavior/Cognition: Alert;Requires cueing;Confused Oral Cavity Assessment: Within Functional Limits Oral Care Completed by SLP: No Oral Cavity - Dentition: Edentulous Self-Feeding Abilities: Total assist Patient Positioning: Upright in bed Baseline Vocal Quality: Normal Volitional Cough: Cognitively unable to elicit Volitional Swallow: Unable to elicit    Oral/Motor/Sensory Function Overall Oral Motor/Sensory Function: Generalized oral weakness   Ice Chips     Thin Liquid Thin Liquid: Impaired Presentation: Cup;Straw Oral Phase Impairments: Reduced labial seal Oral Phase Functional Implications: Right anterior spillage;Left anterior spillage Pharyngeal  Phase Impairments: Suspected delayed Swallow;Cough - Immediate    Nectar Thick Nectar Thick Liquid: Impaired Presentation: Cup;Straw Pharyngeal Phase Impairments: Cough - Immediate   Honey Thick Honey Thick  Liquid: Not tested   Puree Puree: Impaired Oral Phase Functional Implications: Prolonged oral transit   Solid     Solid: Not tested     Herbie Baltimore, MA CCC-SLP [phone]  Milisa Kimbell, Katherene Ponto 08/31/2017,12:32 PM

## 2017-09-01 LAB — COMPREHENSIVE METABOLIC PANEL
ALT: 18 U/L (ref 0–44)
ANION GAP: 8 (ref 5–15)
AST: 15 U/L (ref 15–41)
Albumin: 1.1 g/dL — ABNORMAL LOW (ref 3.5–5.0)
Alkaline Phosphatase: 134 U/L — ABNORMAL HIGH (ref 38–126)
BILIRUBIN TOTAL: 0.6 mg/dL (ref 0.3–1.2)
BUN: 38 mg/dL — ABNORMAL HIGH (ref 8–23)
CHLORIDE: 126 mmol/L — AB (ref 98–111)
CO2: 13 mmol/L — ABNORMAL LOW (ref 22–32)
Calcium: 8.1 mg/dL — ABNORMAL LOW (ref 8.9–10.3)
Creatinine, Ser: 1.4 mg/dL — ABNORMAL HIGH (ref 0.44–1.00)
GFR, EST AFRICAN AMERICAN: 41 mL/min — AB (ref >60)
GFR, EST NON AFRICAN AMERICAN: 35 mL/min — AB (ref >60)
Glucose, Bld: 214 mg/dL — ABNORMAL HIGH (ref 70–99)
POTASSIUM: 3.7 mmol/L (ref 3.5–5.1)
Sodium: 147 mmol/L — ABNORMAL HIGH (ref 135–145)
TOTAL PROTEIN: 4.6 g/dL — AB (ref 6.5–8.1)

## 2017-09-01 LAB — CBC
HEMATOCRIT: 24.7 % — AB (ref 36.0–46.0)
Hemoglobin: 7.5 g/dL — ABNORMAL LOW (ref 12.0–15.0)
MCH: 27.4 pg (ref 26.0–34.0)
MCHC: 30.4 g/dL (ref 30.0–36.0)
MCV: 90.1 fL (ref 78.0–100.0)
Platelets: 324 10*3/uL (ref 150–400)
RBC: 2.74 MIL/uL — ABNORMAL LOW (ref 3.87–5.11)
RDW: 20.8 % — AB (ref 11.5–15.5)
WBC: 11.7 10*3/uL — AB (ref 4.0–10.5)

## 2017-09-01 LAB — GLUCOSE, CAPILLARY
GLUCOSE-CAPILLARY: 196 mg/dL — AB (ref 70–99)
GLUCOSE-CAPILLARY: 223 mg/dL — AB (ref 70–99)

## 2017-09-26 NOTE — Progress Notes (Signed)
Called by bedside RN.  Patient's family at bedside requested to meet with me.  The patient had passed this morning.    I spoke with her sons at bedside and had the privilege of meeting her grandson.  Then met with [facility].  We shared a few stories about this very strong loving woman.     The family is very thankful for the excellent care she has received during this hospitalization. I'm thankful for the opportunity to spend time with them.  [name], PA-C Palliative Medicine Pager: [phone]   No charge note.

## 2017-09-26 NOTE — Progress Notes (Signed)
Notified by RN and [place] RN that patient without pulse and unresponsive. Evaluated patient, no pulse, no heart beat on auscultation. Patient pronounced dead at 1040 on 09-11-2017. Attending provider notified. Patient's daughter Bridgett Hattabaugh notified.   [name] , [facility] September 11, 2017, 11:08 AM Pager: [phone] Consults: [phone] Mon-Fri 7:00 am-4:30 pm Sat-Sun 7:00 am-11:30 am

## 2017-09-26 NOTE — Consult Note (Signed)
[place] nurse into the room for assessment of her ostomies and to change NPWT VAC dressing. Patient is not breathing and there is no pulse.  Patient is a DNR.  Bedside nurse into the room.  Verified with bedside nurse no pulse, no heart beat to ascultation, no respiration.  CCS PA Claiborne Billings [place] notified by bedside nurse.  CCS to pronounce patient and notify family.   [place], [place], [place]

## 2017-09-26 NOTE — Progress Notes (Signed)
[facility] Progress Note  22 Days Post-Op  Subjective: CC: no complaints Patient appears comfortable. Opens eyes to verbal stimulation, follows simple commands. Ate some yogurt this AM. No family present at bedside.   Objective: Vital signs in last 24 hours: Temp:  [97.8 F (36.6 C)] 97.8 F (36.6 C) (08/06 1333) Pulse Rate:  [95-98] 97 (08/07 0609) Resp:  [14-24] 14 (08/07 0609) BP: (103-124)/(37-53) 103/41 (08/07 0609) SpO2:  [96 %-100 %] 100 % (08/07 0609) Weight:  [82.8 kg (182 lb 8.7 oz)] 82.8 kg (182 lb 8.7 oz) (08/07 0500) Last BM Date: 08/31/17  Intake/Output from previous day: 08/06 0701 - 08/07 0700 In: 490 [I.V.:10; NG/GT:480] Out: 3050 [Urine:1200; Stool:1850] Intake/Output this shift: No intake/output data recorded.  PE: Gen:  Alert, NAD, pleasant Card:  Regular rate and rhythm, pedal pulses 2+ BL Pulm:  Normal effort, clear to auscultation bilaterally Abd: Soft, non-tender, non-distended, bowel sounds present in all 4 quadrants, no HSM, incisions C/D/I Skin: warm and dry, no rashes  Psych: A&Ox3   Lab Results:  Recent Labs    08/31/17 0457 09-27-17 0439  WBC 10.7* 11.7*  HGB 7.9* 7.5*  HCT 26.2* 24.7*  PLT 328 324   BMET Recent Labs    08/31/17 0457 27-Sep-2017 0439  NA 147* 147*  K 3.8 3.7  CL 125* 126*  CO2 13* 13*  GLUCOSE 254* 214*  BUN 39* 38*  CREATININE 1.50* 1.40*  CALCIUM 8.1* 8.1*   PT/INR No results for input(s): LABPROT, INR in the last 72 hours. CMP     Component Value Date/Time   NA 147 (H) Sep 27, 2017 0439   K 3.7 27-Sep-2017 0439   CL 126 (H) 2017-09-27 0439   CO2 13 (L) 09-27-2017 0439   GLUCOSE 214 (H) 09/27/17 0439   BUN 38 (H) 09-27-2017 0439   CREATININE 1.40 (H) 09-27-2017 0439   CALCIUM 8.1 (L) 09-27-17 0439   PROT 4.6 (L) 09/27/17 0439   ALBUMIN 1.1 (L) 2017/09/27 0439   AST 15 2017-09-27 0439   ALT 18 27-Sep-2017 0439   ALKPHOS 134 (H) 2017-09-27 0439   BILITOT 0.6 27-Sep-2017 0439    GFRNONAA 35 (L) 09-27-2017 0439   GFRAA 41 (L) 27-Sep-2017 0439   Lipase     Component Value Date/Time   LIPASE 70 (H) 08/08/2017 1954       Studies/Results: No results found.  Anti-infectives: Anti-infectives (From admission, onward)   Start     Dose/Rate Route Frequency Ordered Stop   08/18/17 1200  anidulafungin (ERAXIS) 100 mg in sodium chloride 0.9 % 100 mL IVPB     100 mg 78 mL/hr over 100 Minutes Intravenous Every 24 hours 08/18/17 0907 08/20/17 1432   08/18/17 1000  piperacillin-tazobactam (ZOSYN) IVPB 3.375 g     3.375 g 12.5 mL/hr over 240 Minutes Intravenous Every 8 hours 08/18/17 0907 08/20/17 2250   08/13/17 1100  anidulafungin (ERAXIS) 100 mg in sodium chloride 0.9 % 100 mL IVPB  Status:  Discontinued     100 mg 78 mL/hr over 100 Minutes Intravenous Every 24 hours 08/12/17 1048 08/17/17 1308   08/12/17 1100  anidulafungin (ERAXIS) 200 mg in sodium chloride 0.9 % 200 mL IVPB     200 mg 78 mL/hr over 200 Minutes Intravenous  Once 08/12/17 1048 08/12/17 1648   08/11/17 0600  piperacillin-tazobactam (ZOSYN) IVPB 3.375 g     3.375 g 12.5 mL/hr over 240 Minutes Intravenous Every 8 hours 08/11/17 0025 08/17/17 2359   08/11/17 0115  vancomycin (VANCOCIN) IVPB 750 mg/150 ml premix     750 mg 150 mL/hr over 60 Minutes Intravenous Every 24 hours 08/11/17 0110 08/17/17 0211   08/11/17 0045  piperacillin-tazobactam (ZOSYN) IVPB 3.375 g     3.375 g 100 mL/hr over 30 Minutes Intravenous  Once 08/11/17 0033 08/11/17 0206   08/21/2017 2000  piperacillin-tazobactam (ZOSYN) IVPB 3.375 g  Status:  Discontinued     3.375 g 100 mL/hr over 30 Minutes Intravenous  Once 08/09/2017 1952 08/11/17 0025       Assessment/Plan s/pProcedure(s): EXPLORATORY LAPAROTOMY FOR FREE AIR, LEFT EXTENDED HEMICOLECTOMY, COLOSTOMY AND MUCUS FISTULA (N/A)  Perforated proximal descending colon Extended left hemicolectomy with distal transverse colostomy and mucous fistula creation, 08/11/17,Dr Kae Heller.  POD#21 -TFs atgoal rate and tolerating well with corpak tube - think it will help with swallow eval and trial -BID midline dressing changesand prn for saturation from serous drainage -WOC RN for ostomy care, currently using small eakin pouch to bedside drainage. -WBCnormal, antibiotic course complete.  Sepsis- resolved,secondary to #1,patientoff pressors,abx therapydiscontinued7/26 Thrombocytopenia- resolved VDRF- extubated 6:30 PM 7/25 DM-Lantus,SSI  HTN- resolved AKI on CKD stage III- Cr 1.40 Hypokalemia -resolved Hypomag- resolved Hypernatremia- improving,continue to monitor Severe malnutrition - tube feeds/Speech evaluation for swallowing ABL anemia- H/H 7.5/24.7 s/p transfusion 2u pRBC 7/31 Paroxysmal A.Fib- cards following, diltiazem and beta blocker  Hx of R BKA secondary to peripheral vascular disease- L toes dark, good doppler pulse, continue current care Acute metabolic encephalopathy- multifactoral, followed simple commands this AM. Most telling part of her prognosis  Goals of care- palliative following and greatly appreciate their support; PMT meetingtentativelyscheduled for 8/7 at 3:00 pm. Pt remains DNR, No PEG, No invasive procedures.Anticipate D/C to SNF for long term care with Hospice at some point.  FEN -cortrakwTF @ goal, SLP following VTE - SCDs,Heparinpreviouslyheld for low platelets/anemia, restart subcu heparin 8/2 ID -Zosyn 7/16-->Vanc 7/16-->Eraxis 7/18 -->all abx stopped 08/17/17.(Calcitonin 32) Zosyn/Eraxis restarted 7/24>>7/26 Foley - present, will see what palliative says but likely can d/c and go to wick device  Dispo: palliative meeting 8/7, SLP recommending dysphagia diet. Will check wound with VAC change today.    LOS: 22 days    [name] , [facility] 09/22/2017, 9:19 AM Pager: [phone] Consults: [phone] Mon-Fri 7:00 am-4:30 pm Sat-Sun 7:00 am-11:30 am

## 2017-09-26 NOTE — Death Summary Note (Signed)
DEATH SUMMARY   Patient Details  Name: Judith [name] MRN: [id number] DOB: [birth year]  Admission/Discharge Information   Admit Date:  August 12, 2017  Date of Death: Date of Death: September 03, 2017  Time of Death: Time of Death: 71  Length of Stay: March 21, 2022  Referring Physician: System, Pcp Not In   Reason(s) for Hospitalization  perforated colon   Diagnoses  Preliminary cause of death:  Secondary Diagnoses (including complications and co-morbidities):  Principal Problem:   Metabolic encephalopathy Active Problems:   Hypertension   Insulin dependent diabetes mellitus ([place])   Hx of right BKA ([place])   Obesity (BMI 30-39.9)   Dementia   AKI (acute kidney injury) ([place])   Encounter for hospice care discussion   Perforation of colon s/p left colectomy/ostomy/mucus fistula 08/11/2017   Acute respiratory failure with hypoxia (HCC)   S/P exploratory laparotomy   Septic shock (HCC)   Acute respiratory failure (HCC)   Atrial fibrillation (HCC)   Goals of care, counseling/discussion   DNR (do not resuscitate)   Severe protein-calorie malnutrition ([place])   CKD (chronic kidney disease) stage 3, GFR 30-59 ml/min [facility])   Brief Hospital Course (including significant findings, care, treatment, and services provided and events leading to death)  Judith [name] is a 77 y.o. year old female who presented to [facility] with altered mental status and found to have pneumoperitoneum with peritonitis and septic shock. Patient was taken emergently to the OR for exploration. Post-operatively patient remained in critical condition and was admitted to the ICU. Critical care was consulted for assistance in management of ventilator and septic shock. Kidney function worsened some post-operatively but improved some with IV fluids. Patient was tolerating tube feeds and began to have some ostomy output on POD#3. Heparin stopped 7/21 for thrombocytopenia and concern for HIT, antibodies checked and were within normal range. Palliative care  consulted and met with patient family 7/24 and at that time family decided that they would not want patient to undergo tracheostomy or PEG tube placement and planned for a one way extubation. Code status was changed to DNR, but family hoped she would be able to get back to [facility] facility where she was living. Patient was extubated 7/25 and tolerated well. Patient was able to wean off pressors 7/26. Cardiology consulted 7/30 for paroxysmal atrial fibrillation and recommended rate control and restarting anticoagulation when able. Patient struggled with waking up well after surgery and even after extubation was never verbal and only intermittently followed any commands. SLP was following patient to see if she would be able to start on a diet and was started on dysphagia diet 8/6, prior to this patient was tolerating tube feedings at goal. Family meeting with palliative care was scheduled for 09/04/22 at 3:00 PM. Wound ostomy RN went to see patient for dressing change around 10:30 AM September 04, 2022 and noted patient to be unresponsive and without a pulse. Provider was notified and patient was pronounced deceased at 10:40 AM Sep 03, 2017. Patient's family was notified.      Pertinent Labs and Studies  Significant Diagnostic Studies Ct Abdomen Pelvis Wo Contrast  Result Date: 08/12/2017 CLINICAL DATA:  Abdominal distension.  Follow-up pneumoperitoneum. EXAM: CT ABDOMEN AND PELVIS WITHOUT CONTRAST TECHNIQUE: Multidetector CT imaging of the abdomen and pelvis was performed following the standard protocol without IV contrast. COMPARISON:  Abdominal radiograph 08/12/16 and CT abdomen and pelvis August 01, 2015. FINDINGS: LOWER CHEST: Bilateral lower lobe consolidation. Heart size is normal. Moderate coronary artery calcifications. No pericardial effusion.  HEPATOBILIARY: Normal. PANCREAS: Normal. SPLEEN: Normal. ADRENALS/URINARY TRACT: Kidneys are orthotopic, demonstrating normal size and morphology. No nephrolithiasis,  hydronephrosis; limited assessment for renal masses by nonenhanced CT. 2.5 cm dense (70 Hounsfield units) RIGHT lower pole proteinaceous/hemorrhagic cyst, no routine indicated follow-up by consensus. 9 mm dense RIGHT interpolar cyst. Isodense probable mass lower pole LEFT kidney. All renal masses appear relatively stable from prior contrast enhanced CT. The unopacified ureters are normal in course and caliber. Urinary bladder is partially distended and unremarkable. Normal adrenal glands. STOMACH/BOWEL: Moderate hiatal hernia. Large amount of stool in sigmoid colon through the level of the anus, rectum distended to 9.1 cm. At proximal sigmoid colon is a linear intramural lucency concerning for perforation (coronal image 32), associated circumferential focal mural thickening. Adjacent feculent appearing material in adjacent peritoneum. Moderate colonic diverticulosis. Debris distended stomach. Normal appendix. VASCULAR/LYMPHATIC: Aortoiliac vessels are normal in course and caliber. Moderate to severe calcific atherosclerosis. No lymphadenopathy by CT size criteria. REPRODUCTIVE: Normal. OTHER: Small volume ascites LEFT upper quadrant. Large volume pneumoperitoneum. MUSCULOSKELETAL: Non-acute. IMPRESSION: 1. Large volume pneumoperitoneum. Probable sigmoid bowel perforation with adjacent probable stool in peritoneal cavity. Ulcerated tumor not excluded. 2. Large volume rectosigmoid stool, rectum distended to 9.1 cm. 3. Bilateral lower lobe consolidation concerning for pneumonia. 4. Critical Value/emergent results were called by telephone at the time of interpretation on 08/25/2017 at 8:50 pm to Dr. Dorie Rank , who verbally acknowledged these results. Electronically Signed   By: Elon Alas M.D.   On: 08/18/2017 20:54   Ct Head Wo Contrast  Result Date: 08/19/2017 CLINICAL DATA:  Altered mental status from baseline. EXAM: CT HEAD WITHOUT CONTRAST TECHNIQUE: Contiguous axial images were obtained from the base of  the skull through the vertex without intravenous contrast. COMPARISON:  04/23/2017 FINDINGS: Brain: Subtle hypodensity involving the left pons and midbrain may represent nonhemorrhagic infarcts, apparently new since prior comparison on 04/23/2017. Atrophy with chronic stable small vessel ischemic disease of periventricular white matter. No acute intracranial hemorrhage, midline shift or edema. No large vascular territory infarct. No intra-axial mass nor extra-axial fluid collections. Midline fourth ventricle and basal cisterns without effacement. Vascular: Hyperdense vessel sign. Skull: No acute skull fracture Sinuses/Orbits: Bilateral lens replacements. Clear paranasal sinuses. Other: Clear mastoids IMPRESSION: 1. Chronic appearing small vessel ischemic disease of periventricular white matter with atrophy. 2. Age indeterminate but possibly new since prior exam are subtle hypodensities in the left pons and midbrain suspicious for small nonhemorrhagic infarcts. Electronically Signed   By: Ashley Royalty M.D.   On: 08/16/2017 20:45   Dg Chest Port 1 View  Result Date: 08/23/2017 CLINICAL DATA:  Nasogastric tube, postop EXAM: PORTABLE CHEST 1 VIEW COMPARISON:  Portable exam 1952 hours compared to 08/16/2017 FINDINGS: Nasogastric tube extends into stomach, with proximal side-port at approximately the gastroesophageal junction region. RIGHT jugular central venous catheter tip projects over RIGHT atrium unchanged, though patient is mildly kyphotic in positioning. Upper normal size of cardiac silhouette. Atherosclerotic calcification aorta. Slight pulmonary vascular congestion. Bibasilar atelectasis. Upper lungs clear. No pleural effusion or pneumothorax. IMPRESSION: Tip of nasogastric tube projects over stomach. Bibasilar atelectasis. Electronically Signed   By: Lavonia Dana M.D.   On: 08/23/2017 20:07   Dg Chest Port 1 View  Result Date: 08/16/2017 CLINICAL DATA:  Acute respiratory failure EXAM: PORTABLE CHEST 1 VIEW  COMPARISON:  08/15/2017 FINDINGS: Endotracheal tube, nasogastric catheter and right jugular central line are again identified and stable in appearance. Lungs are well aerated bilaterally. Some improved aeration in the left  base is noted when compare with the previous day. No new focal infiltrate or bony abnormality is noted. IMPRESSION: Improving aeration in the left base. Electronically Signed   By: Inez Catalina M.D.   On: 08/16/2017 06:58   Dg Chest Port 1 View  Result Date: 08/15/2017 CLINICAL DATA:  Acute respiratory failure with hypoxia EXAM: PORTABLE CHEST 1 VIEW COMPARISON:  08/14/2017 FINDINGS: Endotracheal tube 3 cm from carina. NG tube in stomach. Low lung volumes. There is LEFT lower lobe effusion with opacity representing atelectasis or infiltrate. Mild increase in the LEFT upper lobe density. Upper lungs are clear. No pneumothorax IMPRESSION: 1. Stable support apparatus. 2. Increased atelectasis versus infiltrate in the LEFT lower lobe with small effusion. Electronically Signed   By: Suzy Bouchard M.D.   On: 08/15/2017 10:28   Dg Chest Port 1 View  Result Date: 08/14/2017 CLINICAL DATA:  Acute respiratory failure with hypoxia. EXAM: PORTABLE CHEST 1 VIEW COMPARISON:  08/13/2017 FINDINGS: Endotracheal tube has tip 3.3 cm above the carina. Nasogastric tube has side-port over the stomach in the left upper quadrant as tip is not visualized. Right IJ central venous catheter is unchanged with tip just above the cavoatrial junction. Lungs are adequately inflated with subtle left retrocardiac opacification likely atelectasis. Lungs are otherwise clear. Mild stable cardiomegaly. Remainder of the exam is unchanged. IMPRESSION: Mild left retrocardiac opacification likely atelectasis unchanged. Mild stable cardiomegaly. Tubes and lines as described. Electronically Signed   By: Marin Olp M.D.   On: 08/14/2017 09:08   Dg Chest Port 1 View  Result Date: 08/13/2017 CLINICAL DATA:  Acute respiratory  failure. EXAM: PORTABLE CHEST 1 VIEW COMPARISON:  08/12/2017. FINDINGS: Endotracheal tube tip noted 3 cm above the carina. NG tube tip noted below the left hemidiaphragm. Right IJ line tip noted over the right atrium. Heart size normal. Low lung volumes mild bibasilar atelectasis/infiltrates. Small right pleural effusion cannot be excluded. No left pleural effusion noted on today's exam. No pneumothorax. IMPRESSION: 1. Endotracheal tube tip noted 3 cm above the carina. NG tube and right IJ line stable position. 2. Low lung volumes. Bibasilar atelectasis/infiltrates again noted. Small right pleural effusion cannot be excluded. No left pleural effusion noted on today's exam. No pneumothorax. Electronically Signed   By: Marcello Moores  Register   On: 08/13/2017 10:28   Dg Chest Port 1 View  Result Date: 08/12/2017 CLINICAL DATA:  Acute respiratory failure EXAM: PORTABLE CHEST 1 VIEW COMPARISON:  08/11/2017 at 1:35 a.m. FINDINGS: Endotracheal tube tip has been retracted and is just above the level of the clavicular heads. Right internal jugular vein approach central venous catheter tip is in the low right atrium. Small left pleural effusion with associated atelectasis. IMPRESSION: 1. Endotracheal tube tip just above the level of the clavicular heads. 2. Low right atrium positioning of central venous catheter. Correlate with EKG. 3. Small left pleural effusion and associated atelectasis. Electronically Signed   By: Ulyses Jarred M.D.   On: 08/12/2017 13:51   Dg Chest Port 1 View  Result Date: 08/11/2017 CLINICAL DATA:  77 y/o F; post intubation and central line placement. EXAM: PORTABLE CHEST 1 VIEW COMPARISON:  08/08/2017 chest radiograph FINDINGS: Stable cardiomegaly given projection and technique. No appreciable pneumoperitoneum. Stable bibasilar opacities and small effusions. Endotracheal tube tip projects 3.7 cm above the carina. Right central venous catheter tip projects over right cavoatrial junction. Enteric tube  tip extends below field of view into the abdomen. Bones are unremarkable. IMPRESSION: 1. Stable bibasilar atelectasis and small  effusions. 2. Endotracheal tube tip 3.7 cm above the carina. 3. Enteric tube tip extends below field of view into abdomen. 4. Right central venous catheter tip projects over cavoatrial junction. Electronically Signed   By: Kristine Garbe M.D.   On: 08/11/2017 02:05   Dg Chest Port 1 View  Result Date: 08/16/2017 CLINICAL DATA:  Worsening altered mental status. History of dementia. EXAM: PORTABLE CHEST 1 VIEW COMPARISON:  Chest radiograph April 23, 2017 FINDINGS: Air underneath bilateral hemidiaphragms. Bibasilar atelectasis. Stable cardiomegaly. Calcified aortic arch. No pneumothorax. Osteopenia. IMPRESSION: 1. Pneumoperitoneum. 2. Stable cardiomegaly.  Bibasilar atelectasis/scarring. 3.  Aortic Atherosclerosis (ICD10-I70.0). 4. Critical Value/emergent results were called by telephone at the time of interpretation on 08/09/2017 at 7:23 pm to Dr. Dorie Rank , who verbally acknowledged these results. Electronically Signed   By: Elon Alas M.D.   On: 07/26/2017 19:24   Dg Swallowing Func-speech Pathology  Result Date: 08/26/2017 Objective Swallowing Evaluation: Type of Study: MBS-Modified Barium Swallow Study  Patient Details Name: ASHUNTI SCHOFIELD MRN: [id number] Date of Birth: [birth year] Today's Date: 08/26/2017 Time: SLP Start Time (ACUTE ONLY): 1015 -SLP Stop Time (ACUTE ONLY): 1030 SLP Time Calculation (min) (ACUTE ONLY): 15 min Past Medical History: Past Medical History: Diagnosis Date . Anxiety  . Arthritis   "left leg" (05/17/2013) . Asthma  . Charcot's joint of foot due to diabetes ([place])  . Closed right ankle fracture  . Colitis 09/06/02 . Diabetes mellitus without complication ([place])  . Fracture of femur, distal, right, closed ([place]) 03/01/2012 . GERD (gastroesophageal reflux disease)  . JASNKNLZ(767.3)   "probably weekly" (05/17/2013) . Hemorrhoid  . High cholesterol  .  History of stomach ulcers  . Hyperlipidemia  . Hypertension  . Insulin dependent diabetes mellitus ([place]) 03/01/2012 . Obesity (BMI 30-39.9) 03/03/2012 . Perforated viscus 08/14/2017 . Pneumonia   "once" (05/17/2013) . Proctocolitis 09/04/02 Past Surgical History: Past Surgical History: Procedure Laterality Date . ABDOMINAL HYSTERECTOMY   . BELOW KNEE LEG AMPUTATION Right 02/2008 . BREAST LUMPECTOMY Bilateral   "not cancer" . CATARACT EXTRACTION W/ INTRAOCULAR LENS  IMPLANT, BILATERAL Bilateral  . LAPAROTOMY N/A 08/21/2017  Procedure: EXPLORATORY LAPAROTOMY FOR FREE AIR, LEFT EXTENDED HEMICOLECTOMY, COLOSTOMY AND MUCUS FISTULA;  Surgeon: Clovis Riley, MD;  Location: [facility];  Service: General;  Laterality: N/A; . ORIF FEMUR FRACTURE  03/01/2012  Procedure: OPEN REDUCTION INTERNAL FIXATION (ORIF) DISTAL FEMUR FRACTURE;  Surgeon: Rozanna Box, MD;  Location: [facility];  Service: Orthopedics;  Laterality: Right;  ORIF right femur HPI: 77 y.o. female  with past medical history of IDDM, charcot's joint, dementia, anxiety, proctocolitis, PUD, right BKA, asthma, and arthritis who was admitted on 08/21/2017 with septic shock.  She was found to have a bowel perforation and was taken to the OR.  She received a hemicolectomy with colostomy and mucous fistula. Intubated 08/11/17 with one-way extubation on 08/19/17. Per palliative, prognosis <2 weeks in setting of hypotensive with Afib, severely deconditioned, malnourished, demented, with a large abdominal wound. No tracheostomy, no PEG.  Subjective: weakly states name Assessment / Plan / Recommendation CHL IP CLINICAL IMPRESSIONS 08/26/2017 Clinical Impression Difficult study, pt lethargic but opened eyes and responded to PO presentation. Initially pt became drowsy while orally holding PO, requiring deep suction without gag. After this however, pt woke up enough to more actively orally manipulate a bite of puree. Oral phase still prolonged with extended lingual pumping and inefficient oral  transit followed by delayed, weak ineffective swallow response with sever vallecular residue (mild residue  below CP noted). Pharyngeal mobility certainly impacted by large bore NG, however pt too lethargic to warrant removal today as she will need ongoing alternate method of nutrition over the next 24 hours. Suggested removal of NG tomorrow with replacement of a Cortrak which would allow greater progress with diagnostic PO trials. WIll follow for readiness for PO vs further objective testing.  SLP Visit Diagnosis Dysphagia, unspecified (R13.10) Attention and concentration deficit following -- Frontal lobe and executive function deficit following -- Impact on safety and function Severe aspiration risk;Risk for inadequate nutrition/hydration   CHL IP TREATMENT RECOMMENDATION 08/26/2017 Treatment Recommendations Therapy as outlined in treatment plan below   Prognosis 08/26/2017 Prognosis for Safe Diet Advancement Guarded Barriers to Reach Goals Other (Comment) Barriers/Prognosis Comment -- CHL IP DIET RECOMMENDATION 08/26/2017 SLP Diet Recommendations NPO;Alternative means - temporary Liquid Administration via -- Medication Administration Via alternative means Compensations -- Postural Changes --   No flowsheet data found.  CHL IP FOLLOW UP RECOMMENDATIONS 08/26/2017 Follow up Recommendations Skilled Nursing facility   [facility] IP FREQUENCY AND DURATION 08/26/2017 Speech Therapy Frequency (ACUTE ONLY) min 2x/week Treatment Duration 2 weeks      CHL IP ORAL PHASE 08/26/2017 Oral Phase Impaired Oral - Pudding Teaspoon -- Oral - Pudding Cup -- Oral - Honey Teaspoon -- Oral - Honey Cup -- Oral - Nectar Teaspoon Lingual/palatal residue;Piecemeal swallowing;Decreased bolus cohesion;Delayed oral transit;Lingual pumping;Incomplete tongue to palate contact;Right anterior bolus loss;Left anterior bolus loss Oral - Nectar Cup -- Oral - Nectar Straw -- Oral - Thin Teaspoon -- Oral - Thin Cup -- Oral - Thin Straw -- Oral - Puree Lingual/palatal  residue;Piecemeal swallowing;Decreased bolus cohesion;Delayed oral transit;Lingual pumping;Incomplete tongue to palate contact Oral - Mech Soft -- Oral - Regular -- Oral - Multi-Consistency -- Oral - Pill -- Oral Phase - Comment --  CHL IP PHARYNGEAL PHASE 08/26/2017 Pharyngeal Phase Impaired Pharyngeal- Pudding Teaspoon -- Pharyngeal -- Pharyngeal- Pudding Cup -- Pharyngeal -- Pharyngeal- Honey Teaspoon -- Pharyngeal -- Pharyngeal- Honey Cup -- Pharyngeal -- Pharyngeal- Nectar Teaspoon Delayed swallow initiation-vallecula;Reduced epiglottic inversion;Reduced pharyngeal peristalsis;Reduced anterior laryngeal mobility;Reduced laryngeal elevation;Reduced tongue base retraction;Pharyngeal residue - valleculae;Pharyngeal residue - pyriform Pharyngeal -- Pharyngeal- Nectar Cup -- Pharyngeal -- Pharyngeal- Nectar Straw -- Pharyngeal -- Pharyngeal- Thin Teaspoon -- Pharyngeal -- Pharyngeal- Thin Cup -- Pharyngeal -- Pharyngeal- Thin Straw -- Pharyngeal -- Pharyngeal- Puree Delayed swallow initiation-vallecula;Reduced epiglottic inversion;Reduced pharyngeal peristalsis;Reduced anterior laryngeal mobility;Reduced laryngeal elevation;Reduced tongue base retraction;Pharyngeal residue - valleculae;Pharyngeal residue - pyriform Pharyngeal -- Pharyngeal- Mechanical Soft -- Pharyngeal -- Pharyngeal- Regular -- Pharyngeal -- Pharyngeal- Multi-consistency -- Pharyngeal -- Pharyngeal- Pill -- Pharyngeal -- Pharyngeal Comment --  No flowsheet data found. DeBlois, Katherene Ponto 08/26/2017, 11:53 AM               Microbiology No results found for this or any previous visit (from the past 240 hour(s)).  Lab Basic Metabolic Panel: Recent Labs  Lab 08/28/17 0356 08/29/17 0358 08/30/17 0436 08/31/17 0457 09-06-2017 0439  NA 151* 147* 148* 147* 147*  K 3.1* 3.9 3.9 3.8 3.7  CL 130* 127* 128* 125* 126*  CO2 16* 15* 15* 13* 13*  GLUCOSE 251* 242* 292* 254* 214*  BUN 36* 35* 39* 39* 38*  CREATININE 1.50* 1.46* 1.52* 1.50* 1.40*   CALCIUM 7.9* 7.9* 7.8* 8.1* 8.1*  MG 1.8 2.5*  --   --   --    Liver Function Tests: Recent Labs  Lab 08/26/17 0441 08/29/17 0358 08/30/17 0436 08/31/17 0457 09-06-2017 0439  AST 19  11* 12* 14* 15  ALT _0 ALKPHOS 99 95 95 111 134*  BILITOT 0.8 0.5 0.7 0.6 0.6  PROT 4.7* 4.9* 4.8* 4.7* 4.6*  ALBUMIN 1.3* 1.2* 1.1* 1.2* 1.1*   No results for input(s): LIPASE, AMYLASE in the last 168 hours. No results for input(s): AMMONIA in the last 168 hours. CBC: Recent Labs  Lab 08/28/17 0356 08/29/17 0358 08/30/17 0436 08/31/17 0457 09/06/17 0439  WBC 7.9 9.1 11.0* 10.7* 11.7*  HGB 8.3* 8.7* 8.4* 7.9* 7.5*  HCT 27.3* 28.5* 27.9* 26.2* 24.7*  MCV 89.5 88.8 90.0 90.0 90.1  PLT 339 342 329 328 324   Cardiac Enzymes: No results for input(s): CKTOTAL, CKMB, CKMBINDEX, TROPONINI in the last 168 hours. Sepsis Labs: Recent Labs  Lab 08/29/17 0358 08/30/17 0436 08/31/17 0457 September 06, 2017 0439  WBC 9.1 11.0* 10.7* 11.7*    Procedures/Operations  Exploratory laparotomy, extended left hemicolectomy, mobilization of splenic flexure, creation of colostomy and mucous fistula - 08/11/2017 Dr. Rivka Barbara Rayburn September 06, 2017, 11:05 AM

## 2017-09-26 NOTE — Progress Notes (Signed)
Page to Spiritual Care and [place].  Chaplain attended to patient's family. Chaplain gathered present family members at bedside for reflection, reading of connective scripture and prayer.  Prayer was led by a family member who is clergy.  Family is well supported by one another and is a good place of Acceptance concerning the Pt.'s passing.   Ms. Judith [name] is deeply loved by her family. She will have a lasting love print through her posterity.   Family expressed gratitude for the [employer] of [place].

## 2017-09-26 NOTE — Progress Notes (Addendum)
Inpatient Diabetes Program Recommendations  AACE/ADA: New Consensus Statement on Inpatient Glycemic Control (2015)  Target Ranges:  Prepandial:   less than 140 mg/dL      Peak postprandial:   less than 180 mg/dL (1-2 hours)      Critically ill patients:  140 - 180 mg/dL   Results for Judith [name], Judith [name] (MRN [id number]) as of 09-27-17 09:39  Ref. Range 08/31/2017 00:42 08/31/2017 06:39 08/31/2017 13:02 08/31/2017 17:53  Glucose-Capillary Latest Ref Range: 70 - 99 mg/dL 271 (H) 232 (H) 310 (H) 277 (H)   Results for Judith [name], Judith [name] (MRN [id number]) as of Sep 27, 2017 09:39  Ref. Range September 27, 2017 00:58 09/27/17 06:08  Glucose-Capillary Latest Ref Range: 70 - 99 mg/dL 223 (H) 196 (H)    Home DM Meds: Levemir 15 units BID                             Novolog 0-15 units TID per SSI  Current Orders: Lantus 15 units daily                            Novolog Moderate Correction Scale/ SSI (0-15 units) Q6 hours     Getting Vital AF tube feeds at 60cc/hr.  CBGs consistently >200 mg/dl.  Note that Lantus increased to 15 units daily yesterday AM.      MD- Please also consider the following:  1. Change Novolog SSI to Q4 hour coverage (currently ordered Q6 hours)  2. Start Novolog tube feed coverage: Novolog 3 units Q4 hours (Hold if tube feeds held for any reason)     --Will follow patient during hospitalization--  Wyn Quaker RN, MSN, CDE Diabetes Coordinator Inpatient Glycemic Control Team Team Pager: [phone] (8a-5p)

## 2017-09-26 DEATH — deceased

## 2019-07-01 IMAGING — DX DG CHEST 1V PORT
1 series · 1 of 1 positions shown · non-contrast
Comparison: 08/11/2017 at [DATE] a.m.

CLINICAL DATA: Acute respiratory failure

EXAM:
PORTABLE CHEST 1 VIEW

[chest ap]
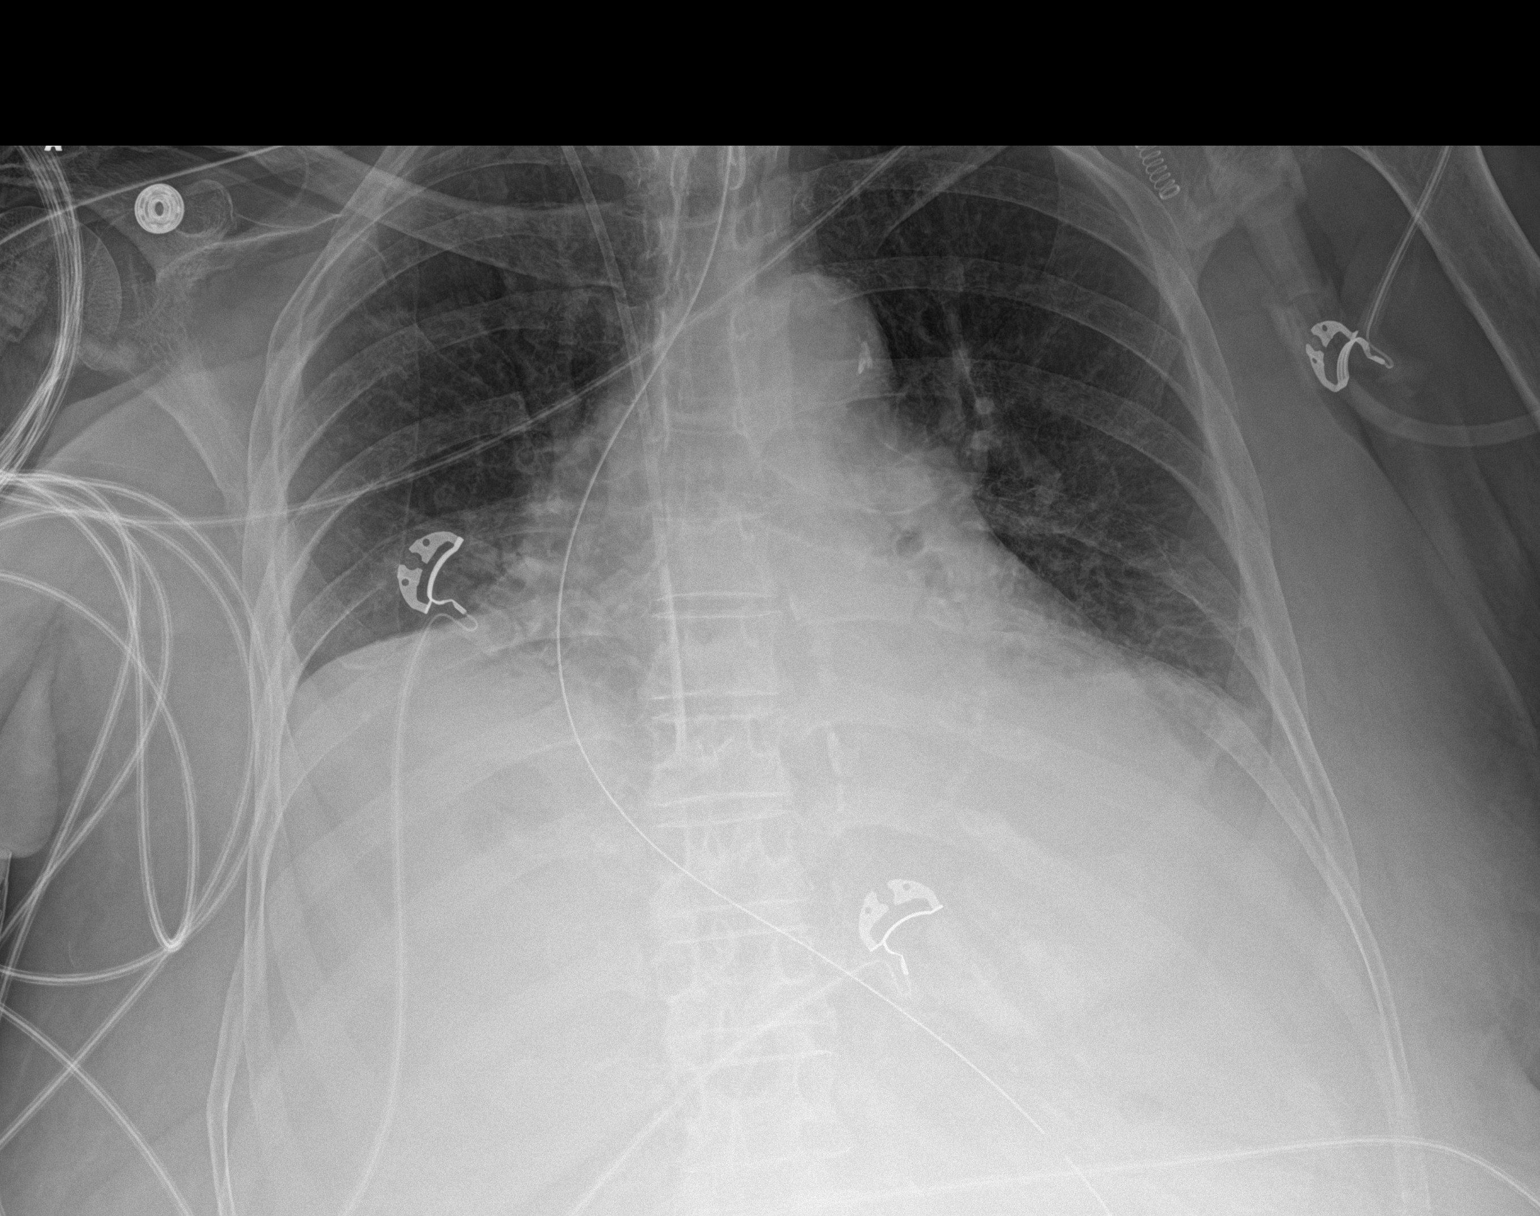

[1 of 1 positions shown; findings below may reference images not displayed]

FINDINGS: Endotracheal tube tip has been retracted and is just above the level
of the clavicular heads. Right internal jugular vein approach
central venous catheter tip is in the low right atrium. Small left
pleural effusion with associated atelectasis.
IMPRESSION: 1. Endotracheal tube tip just above the level of the clavicular
heads.
2. Low right atrium positioning of central venous catheter.
Correlate with EKG.
3. Small left pleural effusion and associated atelectasis.

## 2019-07-03 IMAGING — DX DG CHEST 1V PORT
1 series · 1 of 1 positions shown · non-contrast
Comparison: 08/13/2017

CLINICAL DATA: Acute respiratory failure with hypoxia.

EXAM:
PORTABLE CHEST 1 VIEW

[chest ap]
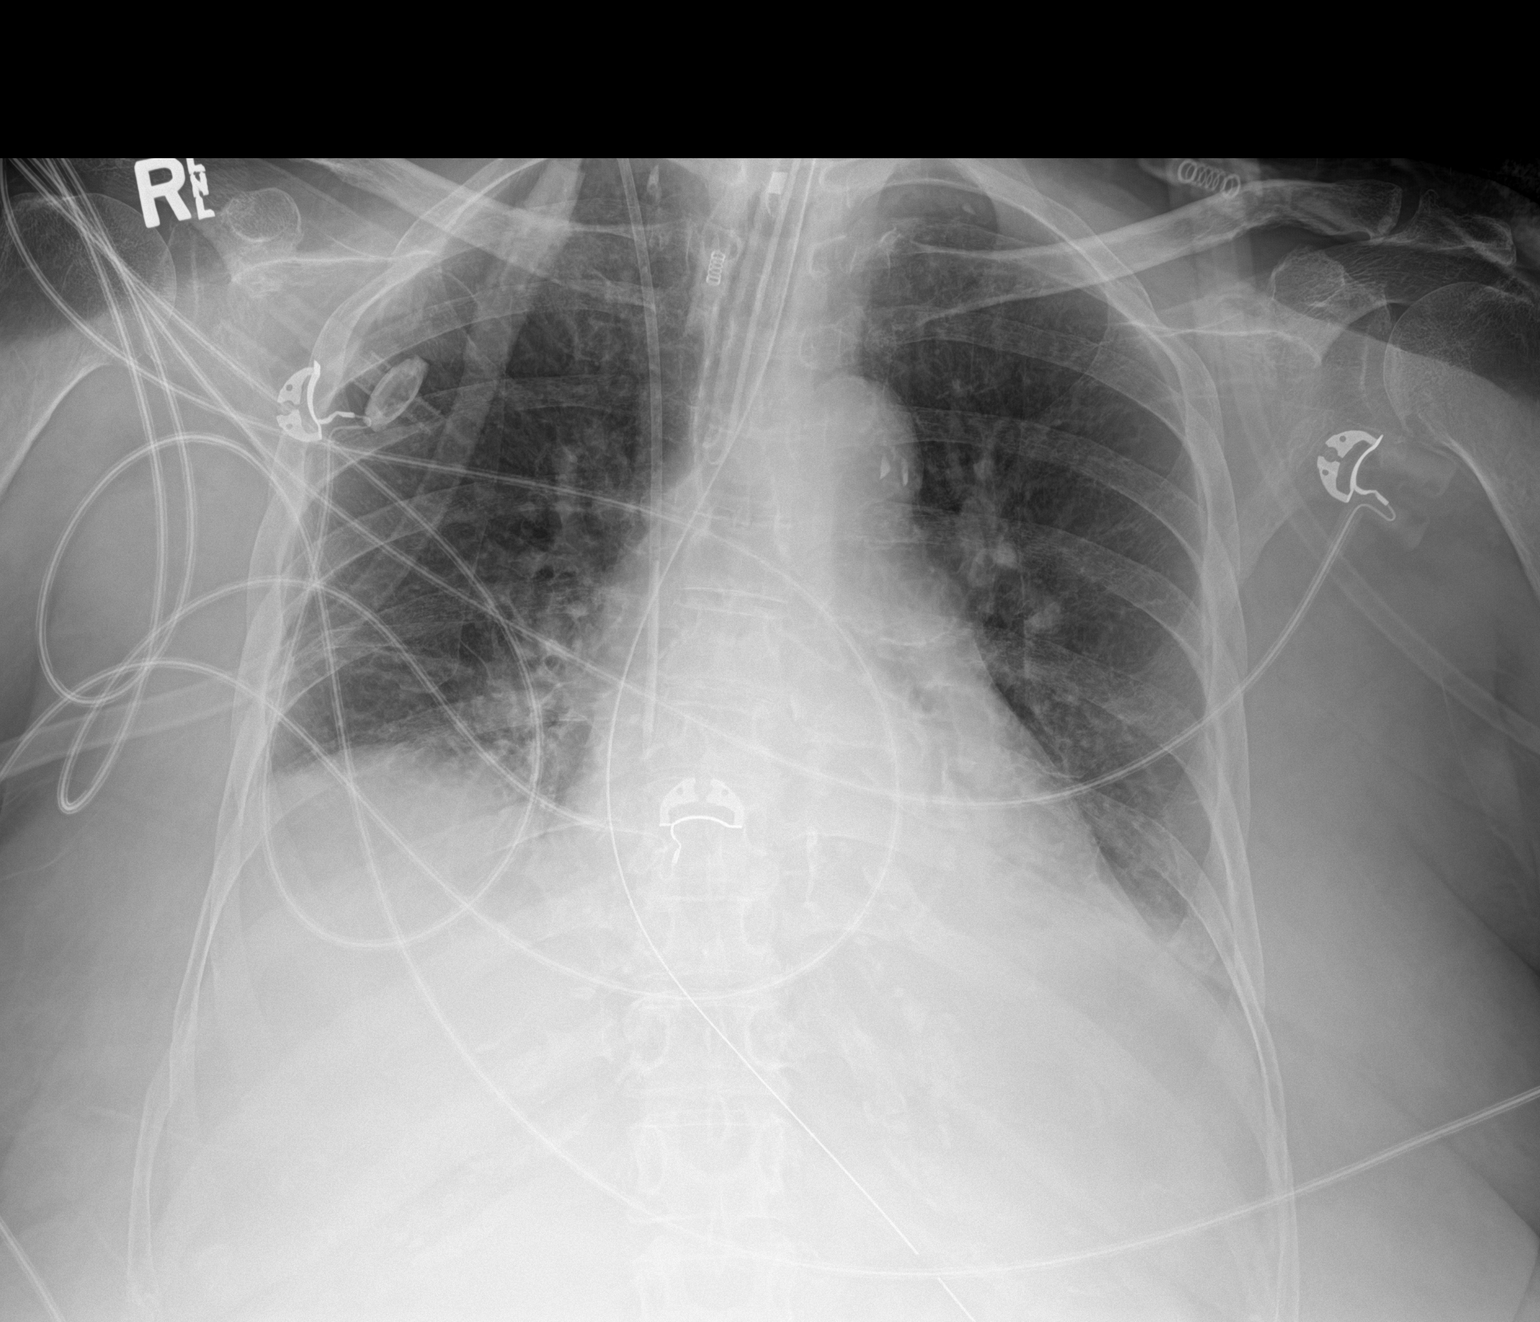

[1 of 1 positions shown; findings below may reference images not displayed]

FINDINGS: Endotracheal tube has tip 3.3 cm above the carina. Nasogastric tube
has side-port over the stomach in the left upper quadrant as tip is
not visualized. Right IJ central venous catheter is unchanged with
tip just above the cavoatrial junction.

Lungs are adequately inflated with subtle left retrocardiac
opacification likely atelectasis. Lungs are otherwise clear. Mild
stable cardiomegaly. Remainder of the exam is unchanged.
IMPRESSION: Mild left retrocardiac opacification likely atelectasis unchanged.
Mild stable cardiomegaly.

Tubes and lines as described.

## 2019-07-04 IMAGING — DX DG CHEST 1V PORT
1 series · 1 of 1 positions shown · non-contrast
Comparison: 08/14/2017

CLINICAL DATA: Acute respiratory failure with hypoxia

EXAM:
PORTABLE CHEST 1 VIEW

[chest]
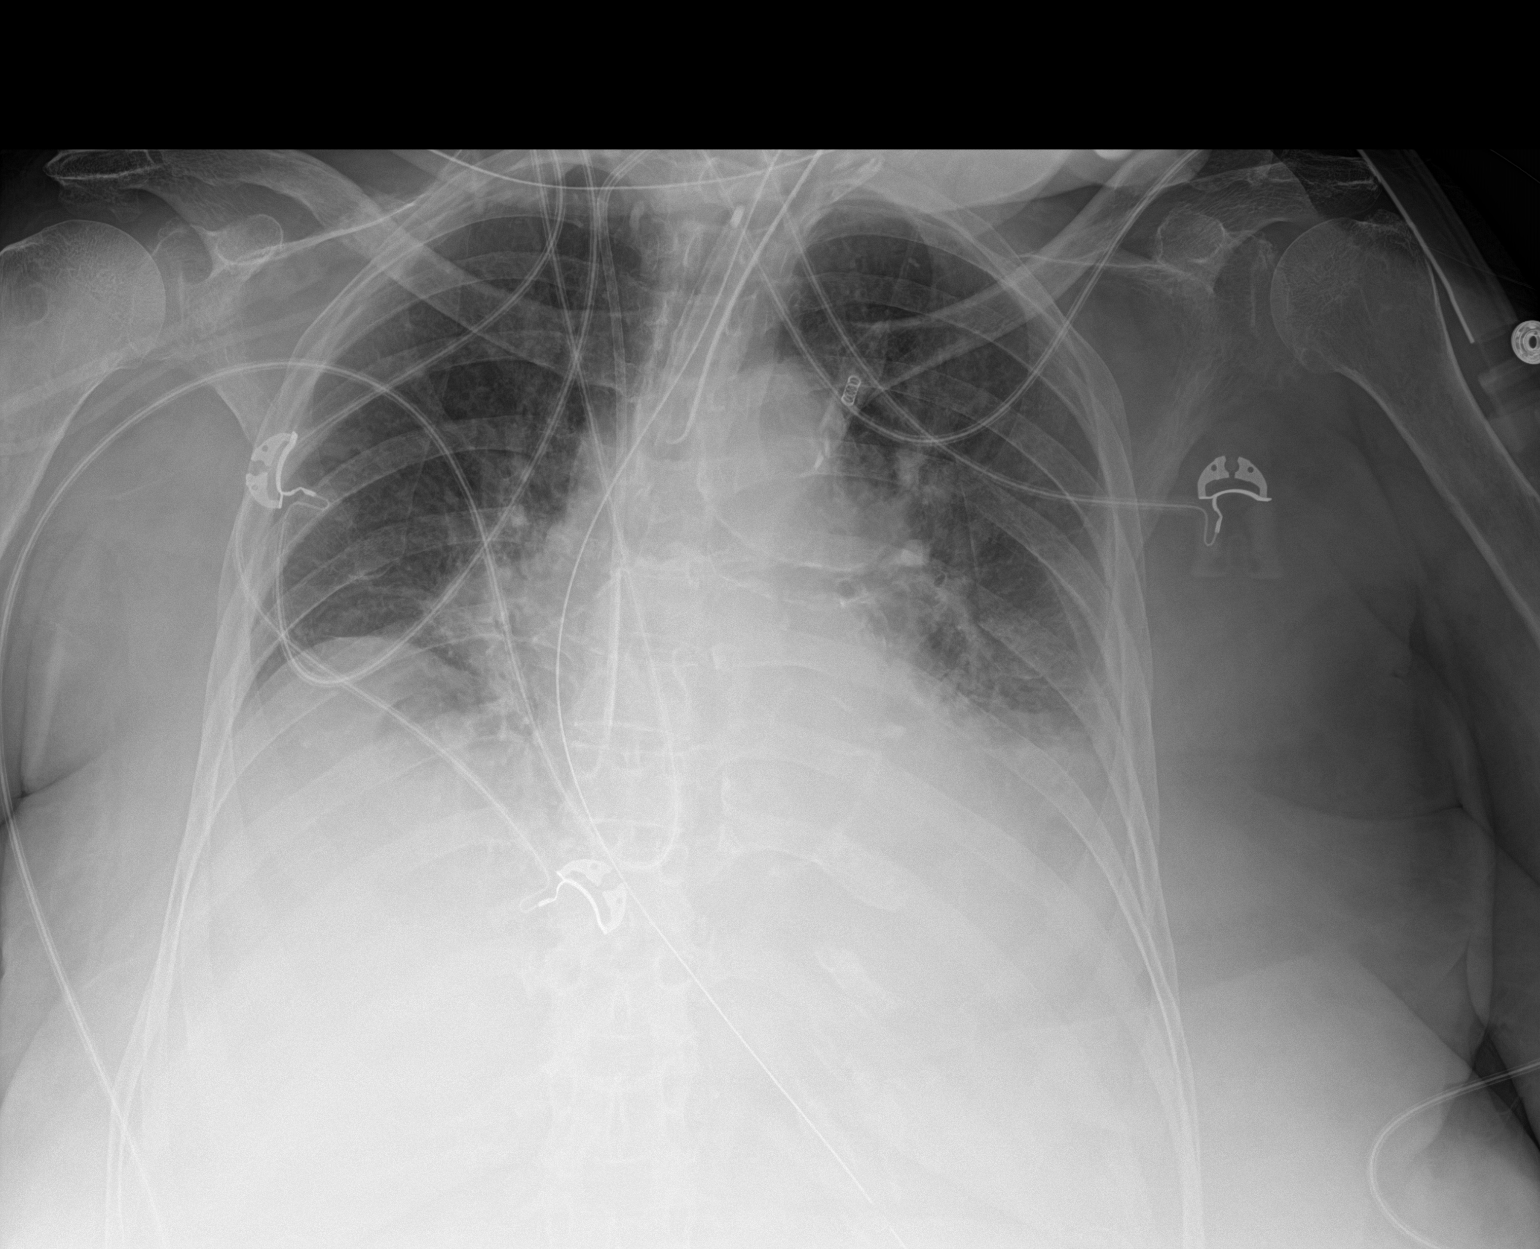

[1 of 1 positions shown; findings below may reference images not displayed]

FINDINGS: Endotracheal tube 3 cm from carina. NG tube in stomach. Low lung
volumes. There is LEFT lower lobe effusion with opacity representing
atelectasis or infiltrate. Mild increase in the LEFT upper lobe
density. Upper lungs are clear. No pneumothorax
IMPRESSION: 1. Stable support apparatus.
2. Increased atelectasis versus infiltrate in the LEFT lower lobe
with small effusion.

## 2019-07-05 IMAGING — DX DG CHEST 1V PORT
1 series · 1 of 1 positions shown · non-contrast
Comparison: 08/15/2017

CLINICAL DATA: Acute respiratory failure

EXAM:
PORTABLE CHEST 1 VIEW

[chest ap]
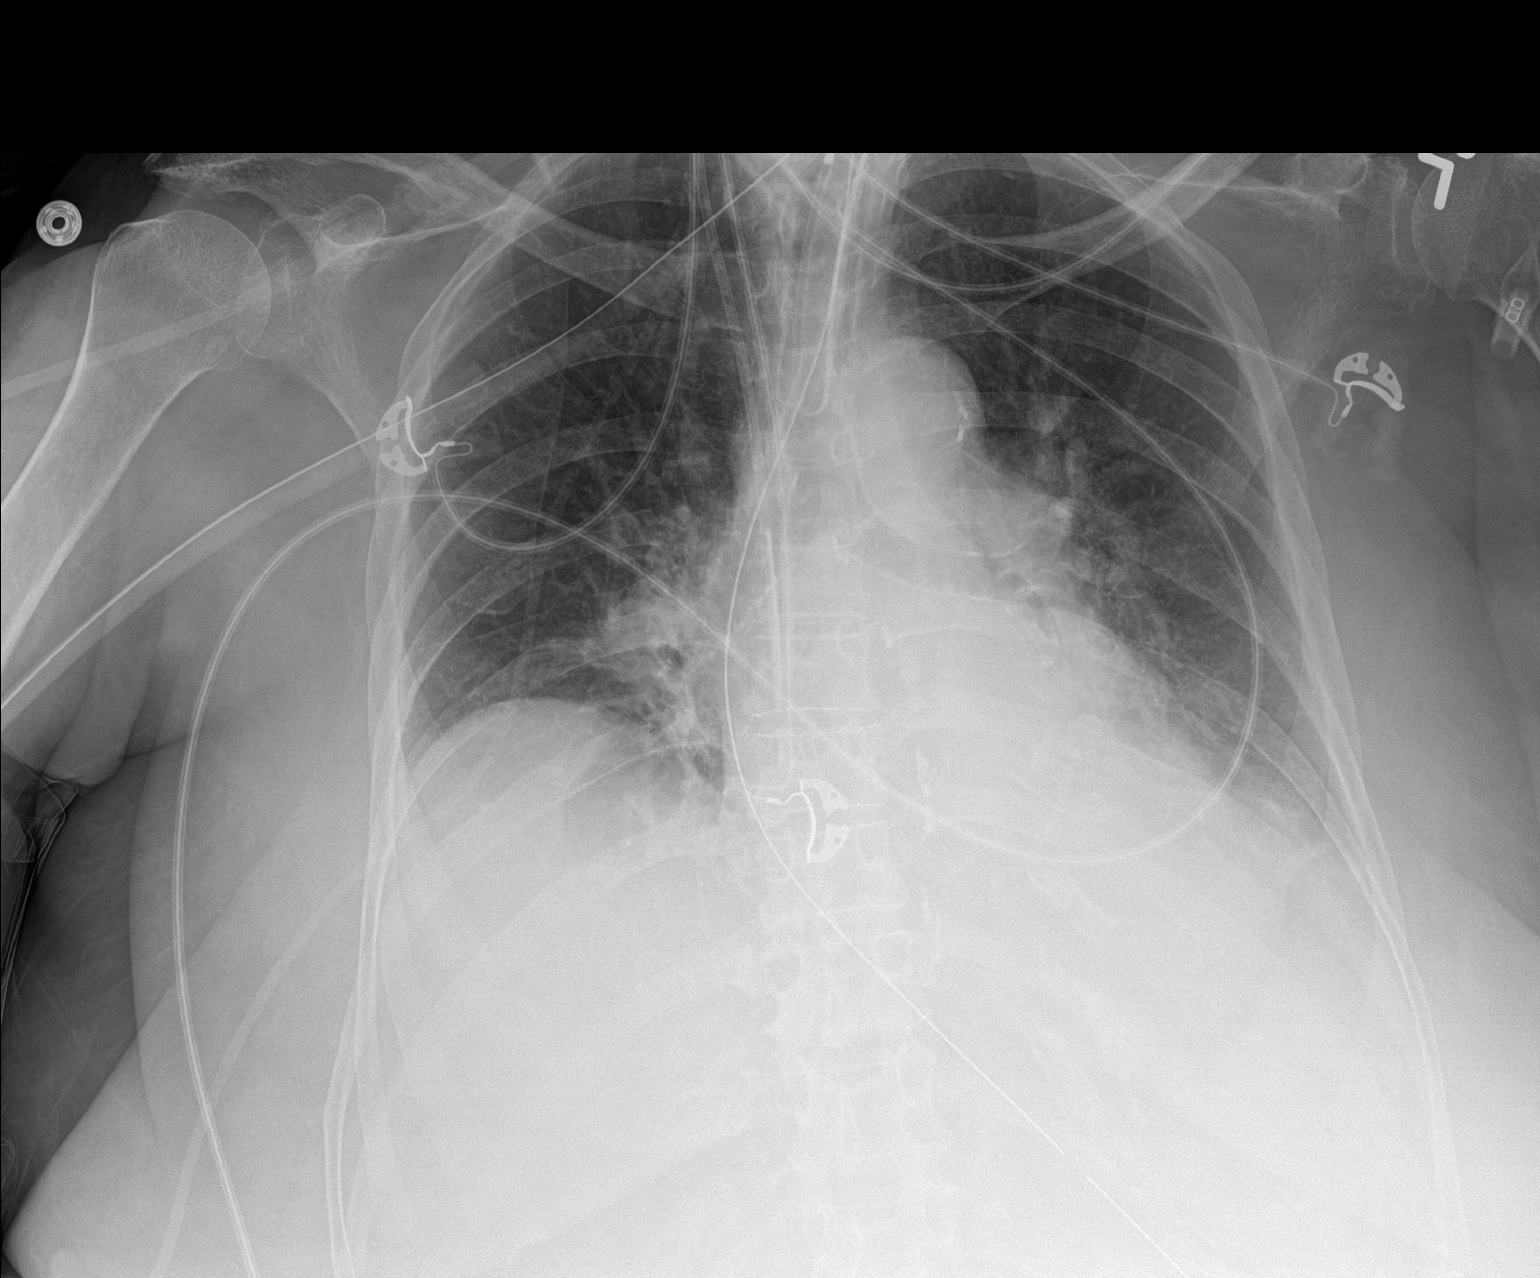

[1 of 1 positions shown; findings below may reference images not displayed]

FINDINGS: Endotracheal tube, nasogastric catheter and right jugular central
line are again identified and stable in appearance. Lungs are well
aerated bilaterally. Some improved aeration in the left base is
noted when compare with the previous day. No new focal infiltrate or
bony abnormality is noted.
IMPRESSION: Improving aeration in the left base.

## 2019-07-15 IMAGING — RF DG SWALLOWING FUNCTION - [facility]
13 of 18 series · 13 of 24 positions shown · non-contrast
Comparison: none

[Series 1: run · 1 of 97 frames shown (1 of 13)]
[frame 2/97]
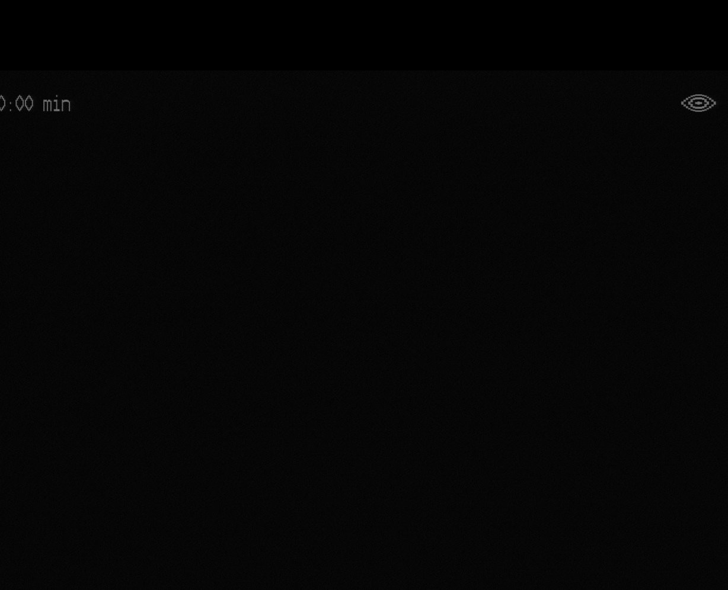

[Series 2: run · 1 of 161 frames shown (2 of 13)]
[frame 137/161]
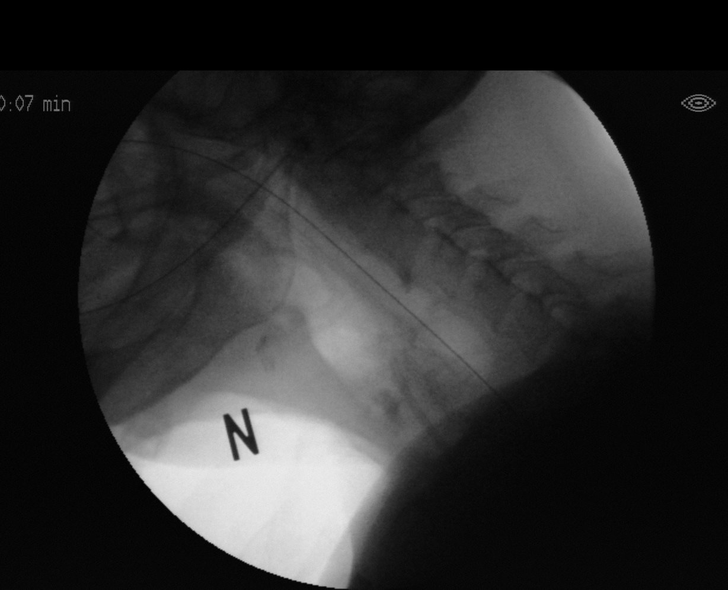

[Series 4: run · 1 of 515 frames shown (3 of 13)]
[frame 78/515]
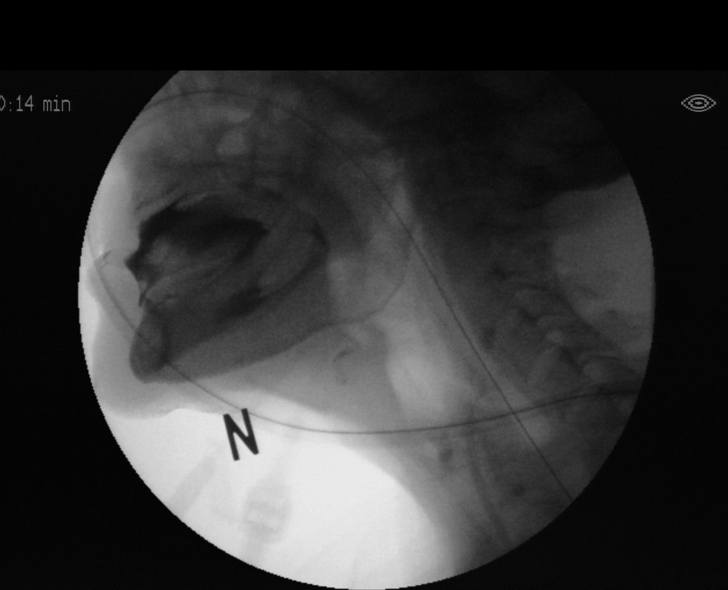

[Series 5: run · 1 of 182 frames shown (4 of 13)]
[frame 155/182]
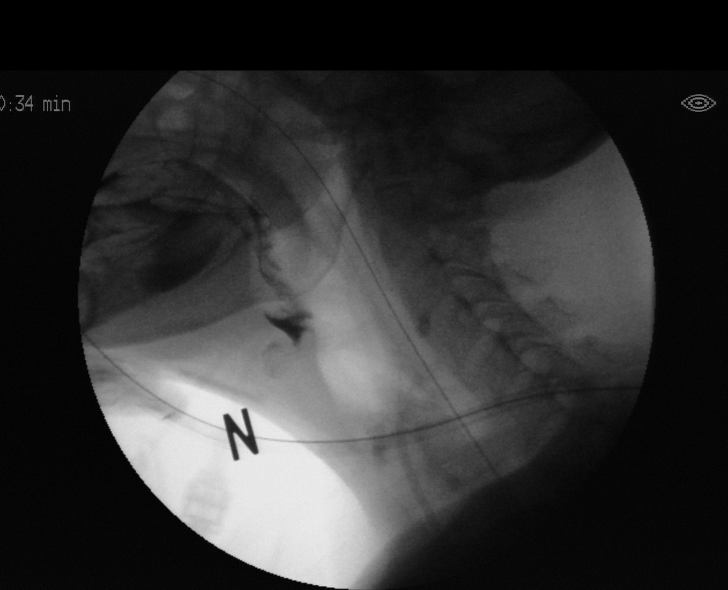

[Series 7: run · 1 of 65 frames shown (5 of 13)]
[frame 10/65]
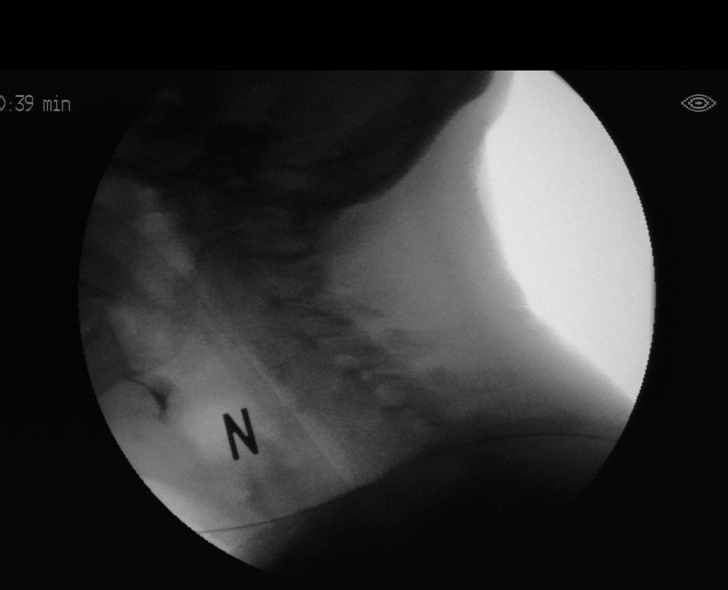

[Series 8: run · 1 of 679 frames shown (6 of 13)]
[frame 578/679]
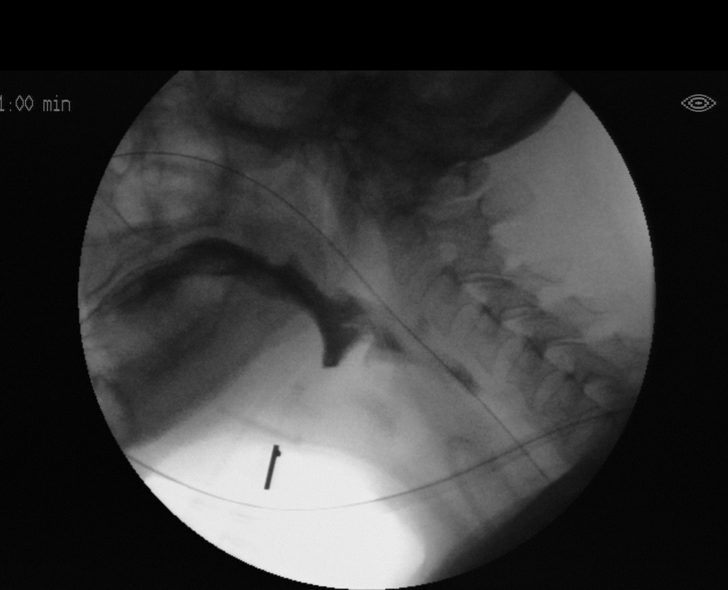

[Series 10: run · 1 of 11 frames shown (7 of 13)]
[frame 4/11]
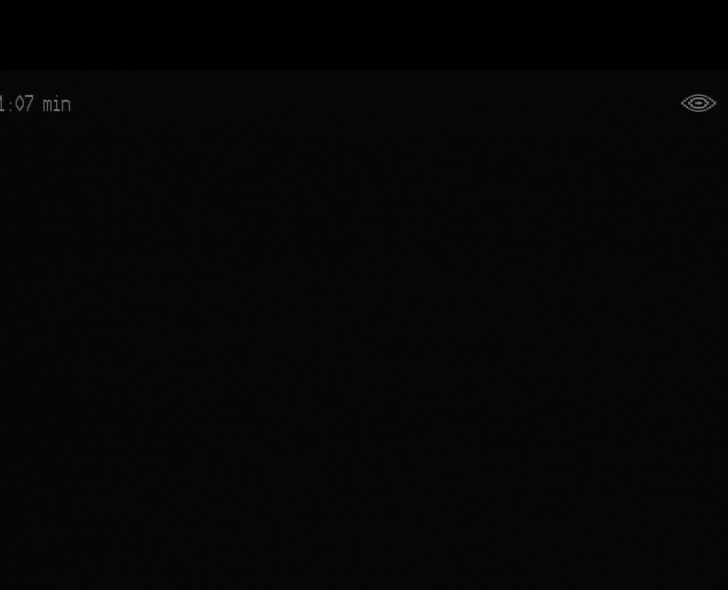

[Series 11: run · 1 of 50 frames shown (8 of 13)]
[frame 8/50]
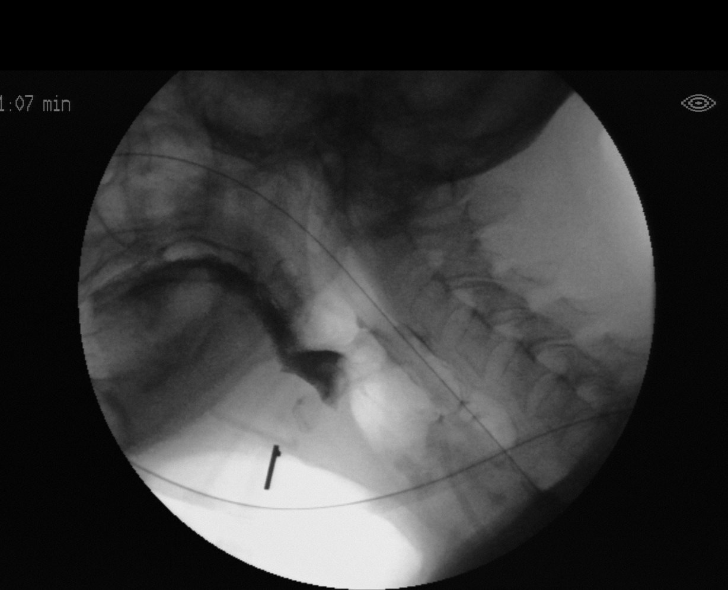

[Series 12: run · 1 of 382 frames shown (9 of 13)]
[frame 192/382]
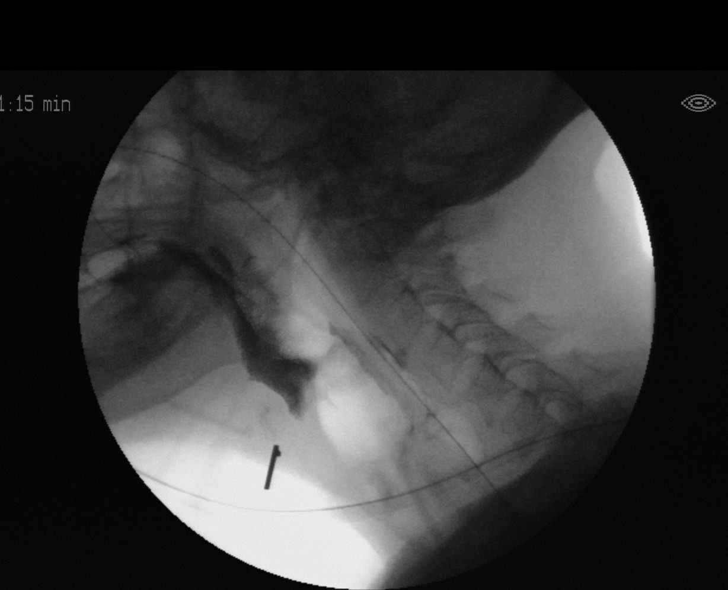

[Series 14: run · 1 of 51 frames shown (10 of 13)]
[frame 1/51]
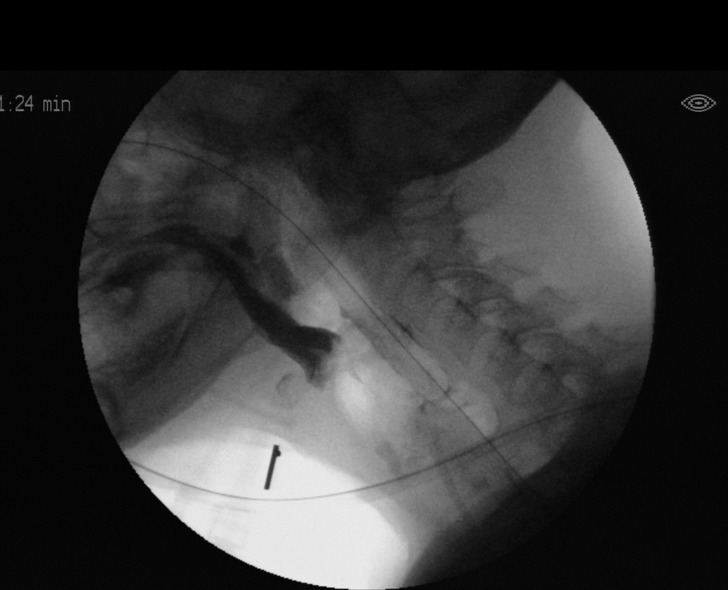

[Series 15: run · 1 of 494 frames shown (11 of 13)]
[frame 420/494]
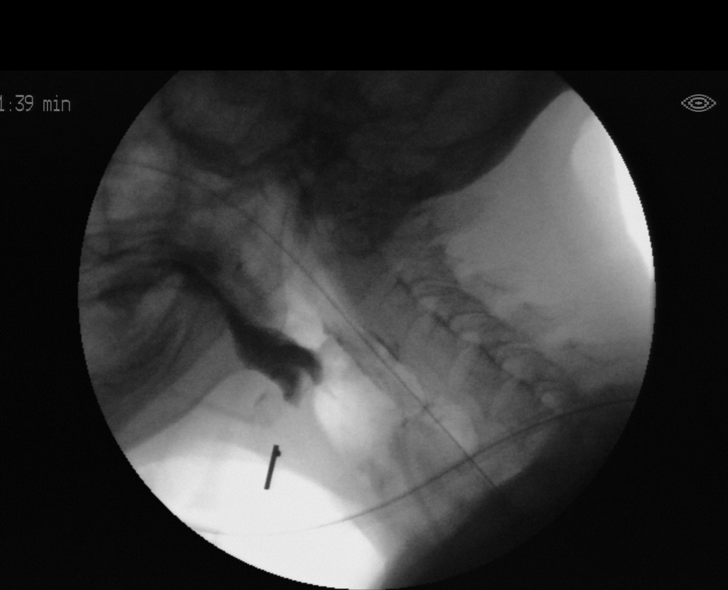

[Series 17: run · 1 of 30 frames shown (12 of 13)]
[frame 5/30]
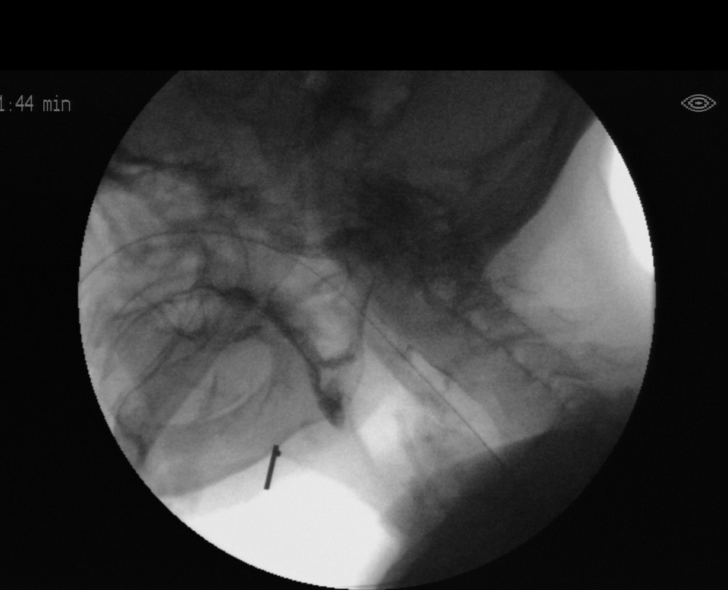

[Series 18: run · 1 of 300 frames shown (13 of 13)]
[frame 258/300]
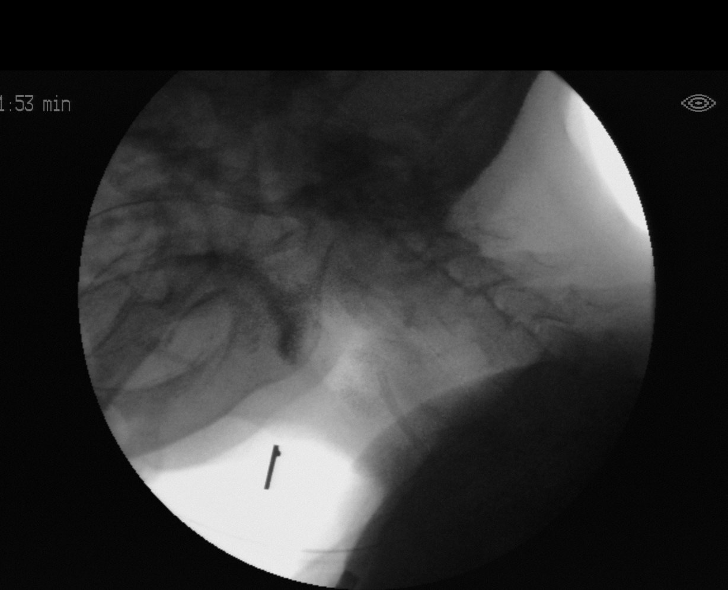

[13 of 24 positions shown; findings below may reference images not displayed]

FLUOROSCOPY FOR SWALLOWING FUNCTION STUDY:
Fluoroscopy was provided for swallowing function study, which was administered by a speech pathologist.  Final results and recommendations from this study are contained within the speech pathology report.
